# Patient Record
Sex: Female | Born: 1945 | Race: White | Hispanic: No | Marital: Married | Smoking: Never smoker
Health system: Southern US, Community
[De-identification: ages and names within clinical notes are randomized; demographics above are authoritative.]

## PROBLEM LIST (undated history)

## (undated) DIAGNOSIS — R000 Tachycardia, unspecified: Secondary | ICD-10-CM

## (undated) DIAGNOSIS — R432 Parageusia: Secondary | ICD-10-CM

## (undated) DIAGNOSIS — S32020A Wedge compression fracture of second lumbar vertebra, initial encounter for closed fracture: Secondary | ICD-10-CM

## (undated) DIAGNOSIS — M8580 Other specified disorders of bone density and structure, unspecified site: Secondary | ICD-10-CM

## (undated) DIAGNOSIS — G588 Other specified mononeuropathies: Secondary | ICD-10-CM

## (undated) DIAGNOSIS — M1990 Unspecified osteoarthritis, unspecified site: Secondary | ICD-10-CM

## (undated) DIAGNOSIS — M810 Age-related osteoporosis without current pathological fracture: Secondary | ICD-10-CM

## (undated) DIAGNOSIS — E785 Hyperlipidemia, unspecified: Secondary | ICD-10-CM

## (undated) DIAGNOSIS — N3010 Interstitial cystitis (chronic) without hematuria: Secondary | ICD-10-CM

## (undated) DIAGNOSIS — M479 Spondylosis, unspecified: Secondary | ICD-10-CM

## (undated) DIAGNOSIS — F32A Depression, unspecified: Secondary | ICD-10-CM

## (undated) DIAGNOSIS — F419 Anxiety disorder, unspecified: Secondary | ICD-10-CM

## (undated) DIAGNOSIS — H269 Unspecified cataract: Secondary | ICD-10-CM

## (undated) DIAGNOSIS — U071 COVID-19: Secondary | ICD-10-CM

## (undated) DIAGNOSIS — E079 Disorder of thyroid, unspecified: Secondary | ICD-10-CM

## (undated) DIAGNOSIS — G629 Polyneuropathy, unspecified: Secondary | ICD-10-CM

## (undated) DIAGNOSIS — T7840XA Allergy, unspecified, initial encounter: Secondary | ICD-10-CM

## (undated) DIAGNOSIS — Z8719 Personal history of other diseases of the digestive system: Secondary | ICD-10-CM

## (undated) DIAGNOSIS — B977 Papillomavirus as the cause of diseases classified elsewhere: Secondary | ICD-10-CM

## (undated) HISTORY — DX: Anxiety disorder, unspecified: F41.9

## (undated) HISTORY — DX: Unspecified osteoarthritis, unspecified site: M19.90

## (undated) HISTORY — DX: Other specified disorders of bone density and structure, unspecified site: M85.80

## (undated) HISTORY — DX: Interstitial cystitis (chronic) without hematuria: N30.10

## (undated) HISTORY — DX: Papillomavirus as the cause of diseases classified elsewhere: B97.7

## (undated) HISTORY — DX: Personal history of other diseases of the digestive system: Z87.19

## (undated) HISTORY — DX: Other specified mononeuropathies: G58.8

## (undated) HISTORY — DX: Parageusia: R43.2

## (undated) HISTORY — DX: Wedge compression fracture of second lumbar vertebra, initial encounter for closed fracture: S32.020A

## (undated) HISTORY — DX: Tachycardia, unspecified: R00.0

## (undated) HISTORY — DX: Hyperlipidemia, unspecified: E78.5

## (undated) HISTORY — DX: Spondylosis, unspecified: M47.9

## (undated) HISTORY — DX: Age-related osteoporosis without current pathological fracture: M81.0

## (undated) HISTORY — DX: Disorder of thyroid, unspecified: E07.9

## (undated) HISTORY — DX: Unspecified cataract: H26.9

## (undated) HISTORY — DX: COVID-19: U07.1

## (undated) HISTORY — DX: Depression, unspecified: F32.A

## (undated) HISTORY — DX: Allergy, unspecified, initial encounter: T78.40XA

## (undated) HISTORY — PX: WISDOM TOOTH EXTRACTION: SHX21

## (undated) HISTORY — DX: Polyneuropathy, unspecified: G62.9

---

## 1983-01-11 HISTORY — PX: FOOT NEUROMA SURGERY: SHX646

## 2005-08-12 ENCOUNTER — Encounter: Payer: Self-pay | Admitting: Cardiovascular Disease

## 2006-09-02 ENCOUNTER — Encounter: Payer: Self-pay | Admitting: Cardiovascular Disease

## 2006-09-07 ENCOUNTER — Encounter: Payer: Self-pay | Admitting: Cardiovascular Disease

## 2010-07-21 ENCOUNTER — Ambulatory Visit: Payer: Self-pay | Admitting: Family Medicine

## 2010-10-12 ENCOUNTER — Ambulatory Visit: Payer: Self-pay | Admitting: Internal Medicine

## 2010-10-14 ENCOUNTER — Ambulatory Visit: Payer: Self-pay | Admitting: Internal Medicine

## 2010-12-07 ENCOUNTER — Ambulatory Visit: Payer: Self-pay | Admitting: Unknown Physician Specialty

## 2011-11-29 ENCOUNTER — Ambulatory Visit: Payer: Self-pay | Admitting: Internal Medicine

## 2012-08-01 ENCOUNTER — Ambulatory Visit: Payer: Self-pay | Admitting: Internal Medicine

## 2013-01-10 DIAGNOSIS — G629 Polyneuropathy, unspecified: Secondary | ICD-10-CM

## 2013-01-10 HISTORY — DX: Polyneuropathy, unspecified: G62.9

## 2013-03-13 ENCOUNTER — Ambulatory Visit (INDEPENDENT_AMBULATORY_CARE_PROVIDER_SITE_OTHER): Payer: Medicare Other | Admitting: Podiatry

## 2013-03-13 ENCOUNTER — Encounter: Payer: Self-pay | Admitting: Podiatry

## 2013-03-13 VITALS — BP 104/60 | HR 93 | Resp 16 | Ht 67.0 in | Wt 116.0 lb

## 2013-03-13 DIAGNOSIS — M79609 Pain in unspecified limb: Secondary | ICD-10-CM

## 2013-03-13 DIAGNOSIS — L608 Other nail disorders: Secondary | ICD-10-CM

## 2013-03-13 NOTE — Progress Notes (Signed)
   Subjective:    Patient ID: Jessica Miles, female    DOB: 21-Oct-1945, 68 y.o.   MRN: [id number]  HPI Comments: This little pinky toenail on my left foot, it is thick and sore. It even hurts when laying in bed and it touches the bed sheet and certain shoes. Have used lamisil in the past for it . It was removed as well .      Review of Systems  All other systems reviewed and are negative.       Objective:   Physical Exam I have reviewed her past medical history medications allergies surgeries social history. Pulses are strongly palpable bilateral. Neurologic sensorium is intact per since once the monofilament. Deep tendon reflexes are intact bilateral muscle strength is 5 over 5 dorsiflexors plantar flexors inverters and everters all intrinsic musculature is intact. Orthopedic evaluation demonstrates all joints distal to the ankle a full range of motion without crepitus she does demonstrate an adductovarus rotated hammertoe deformity fifth left. Cutaneous evaluation demonstrates supple well hydrated cutis with exception of the fifth digit of the left foot which does demonstrate a dystrophic nail possibly mycotic.        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  Plan: Debridement of thickness of the nail today hopefully this will alleviate her symptoms should it not then nail avulsion with matrixectomy is her next option

## 2013-07-03 ENCOUNTER — Ambulatory Visit (INDEPENDENT_AMBULATORY_CARE_PROVIDER_SITE_OTHER): Payer: Medicare Other | Admitting: Podiatry

## 2013-07-03 ENCOUNTER — Ambulatory Visit (INDEPENDENT_AMBULATORY_CARE_PROVIDER_SITE_OTHER): Payer: Medicare Other

## 2013-07-03 VITALS — BP 99/55 | HR 86 | Resp 16

## 2013-07-03 DIAGNOSIS — M779 Enthesopathy, unspecified: Secondary | ICD-10-CM

## 2013-07-03 DIAGNOSIS — M7750 Other enthesopathy of unspecified foot: Secondary | ICD-10-CM

## 2013-07-03 NOTE — Progress Notes (Signed)
She presents today complaining of numbness and tingling to the ball of her foot.  Objective: Vital signs are stable she is alert and oriented x3. Pulses are palpable bilateral. Medial lateral compression of the metatarsals replicates the sensations that she feels with shoe gear. When asked her of her shoe gear is narrow she states yes. This simply comes down tissues been too narrow. Radiographic evaluation does not demonstrate any type of osseous abnormalities whatsoever.  Assessment: Metatarsalgia with tight shoe gear.  Plan: Discussed appropriate shoe gear. Followup with me as needed

## 2013-07-22 ENCOUNTER — Ambulatory Visit: Payer: Medicare Other | Admitting: Podiatry

## 2013-07-22 ENCOUNTER — Encounter: Payer: Self-pay | Admitting: Podiatry

## 2013-07-22 VITALS — BP 105/62 | HR 74 | Resp 12

## 2013-07-22 DIAGNOSIS — M7750 Other enthesopathy of unspecified foot: Secondary | ICD-10-CM

## 2013-07-22 DIAGNOSIS — G589 Mononeuropathy, unspecified: Secondary | ICD-10-CM

## 2013-07-22 DIAGNOSIS — M778 Other enthesopathies, not elsewhere classified: Secondary | ICD-10-CM

## 2013-07-22 DIAGNOSIS — M779 Enthesopathy, unspecified: Secondary | ICD-10-CM

## 2013-07-22 DIAGNOSIS — M79609 Pain in unspecified limb: Secondary | ICD-10-CM

## 2013-07-22 DIAGNOSIS — G629 Polyneuropathy, unspecified: Secondary | ICD-10-CM

## 2013-07-22 NOTE — Progress Notes (Signed)
She presents today still complaining of numbness in the swelling sensation to the forefoot bilaterally right greater than left intermittent. She has tried different shoes to no avail. She is requesting metatarsal pads or metatarsal bars or even orthotics. She relates that her mother had a similar issue.  Objective: Vital signs are stable she is alert and oriented x3 pulses are strongly palpable bilateral. No reproducible sensation today upon palpation. She does have pain on plantar flexion at the second metatarsophalangeal joint that could result in swelling and inflammation that could cause neuritis or neuropathy type symptoms. Otherwise on leaning toward idiopathic peripheral neuropathy.  Assessment: Rule out idiopathic neuropathy. Consider capsulitis second metatarsophalangeal joint bilateral.  Plan: Placed metatarsal pads on her insoles within her tennis shoes. I injected about the second metatarsophalangeal joint of the bilateral foot with Kenalog and local anesthetic. She also will start NuRemedy.

## 2013-08-12 ENCOUNTER — Ambulatory Visit (INDEPENDENT_AMBULATORY_CARE_PROVIDER_SITE_OTHER): Payer: Medicare Other | Admitting: Podiatry

## 2013-08-12 ENCOUNTER — Ambulatory Visit: Payer: Medicare Other | Admitting: Podiatry

## 2013-08-12 VITALS — BP 111/64 | HR 72 | Resp 16

## 2013-08-12 DIAGNOSIS — G629 Polyneuropathy, unspecified: Secondary | ICD-10-CM

## 2013-08-12 DIAGNOSIS — G589 Mononeuropathy, unspecified: Secondary | ICD-10-CM

## 2013-08-12 NOTE — Progress Notes (Signed)
She presents today for followup and a better understanding of her idiopathic neuropathy. She states that since she has been taking the supplements the numbness in the tips of her toes seems to have resolved to some degree. However they seem she also be aggravated by certain exercises and pressures.  Objective: Vital signs are stable she is alert and oriented x3. No change in her physical findings. Pulses remain strongly palpable.  Assessment: Idiopathic neuropathy bilateral.  Plan: Discussed etiology pathology conservative or surgical therapies. I would continue to take the medication if it is helping. At this point I think the best thing is to research it causes her of her neuropathy. A neurologist probably benefit her best at this point. We could consider physical therapy as an adjunct later we also could consider orthotics.

## 2013-08-14 ENCOUNTER — Encounter: Payer: Self-pay | Admitting: Podiatry

## 2013-08-14 NOTE — Progress Notes (Signed)
Patient referred to [facility] , appt is for 8.11.15 @ 9.00

## 2013-08-20 ENCOUNTER — Ambulatory Visit (INDEPENDENT_AMBULATORY_CARE_PROVIDER_SITE_OTHER): Payer: Medicare Other | Admitting: Diagnostic Neuroimaging

## 2013-08-20 ENCOUNTER — Encounter: Payer: Self-pay | Admitting: Diagnostic Neuroimaging

## 2013-08-20 VITALS — BP 125/69 | HR 72 | Ht 67.0 in | Wt 116.2 lb

## 2013-08-20 DIAGNOSIS — M79609 Pain in unspecified limb: Secondary | ICD-10-CM

## 2013-08-20 DIAGNOSIS — R200 Anesthesia of skin: Secondary | ICD-10-CM

## 2013-08-20 DIAGNOSIS — R209 Unspecified disturbances of skin sensation: Secondary | ICD-10-CM

## 2013-08-20 NOTE — Progress Notes (Signed)
[facility]  PATIENT: Jessica Miles DOB: [birth year]  REFERRING CLINICIAN: M Hyatt HISTORY FROM: patient  REASON FOR VISIT: new consult   HISTORICAL  CHIEF COMPLAINT:  Chief Complaint  Patient presents with  . Peripheral Neuropathy    HISTORY OF PRESENT ILLNESS:   68 year old right-handed female with anxiety, tachycardia, hypothyroidism, IBS, here for evaluation of neuropathy.  5 months ago patient had significant increase in physical activity with increased exercise on a daily basis. End of June 2015 patient also started playing tennis. Within a few weeks patient developed numbness in her toes and feet. She felt a "pad sensation" on the balls of her feet. Patient also was developing some cramps in her calves. Patient went to PCP and podiatry, treated with metatarsal pads, cortisone shots in the feet, started on vitamin supplementation. Over the past few weeks patient symptoms have slightly improved. She still has some numbness on the bottom of her feet as well as some intermittent cramps. Patient is also to try to reduce her physical activity. Patient had blood testing by PCP with normal B12 level, and slightly low TSH.  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  Patient denies any neck or low back pain. No problems with her fingers or hands. No vision changes, slurred speech trouble talking headaches.  REVIEW OF SYSTEMS: Full 14 system review of systems performed and notable only for leg cramps anxiety feeling cold numbness ringing in ears.  ALLERGIES: Allergies  Allergen Reactions  . Allegra [Fexofenadine Hcl]     Makes heart race   . Fexofenadine Palpitations    HOME MEDICATIONS: Outpatient Prescriptions Prior to Visit  Medication Sig Dispense Refill  . ascorbic acid (VITAMIN C) 500 MG tablet Take 500 mg by mouth 2 (two) times daily.       Marland Kitchen atenolol (TENORMIN) 25 MG tablet Take 25 mg by mouth  daily as needed.       Marland Kitchen buPROPion (WELLBUTRIN SR) 100 MG 12 hr tablet Take 100 mg by mouth 2 (two) times daily.      . cycloSPORINE (RESTASIS) 0.05 % ophthalmic emulsion 1 drop 2 (two) times daily.      Marland Kitchen levothyroxine (SYNTHROID, LEVOTHROID) 100 MCG tablet Take 100 mcg by mouth daily before breakfast.      . saccharomyces boulardii (FLORASTOR) 250 MG capsule Take by mouth 2 (two) times daily.       . Tobramycin-Dexamethasone (TOBRADEX OP) Apply to eye as needed.       Marland Kitchen atenolol (TENORMIN) 25 MG tablet Take by mouth.      Marland Kitchen buPROPion (WELLBUTRIN SR) 100 MG 12 hr tablet Take by mouth.      . calcium carbonate (OS-CAL) 600 MG TABS tablet Take 600 mg by mouth 2 (two) times daily with a meal.      . Calcium Citrate-Vitamin D ([employer] CALCIUM CITRATE +D) 315-250 MG-UNIT TABS Take by mouth.      . cycloSPORINE (RESTASIS) 0.05 % ophthalmic emulsion Apply to eye.      . levothyroxine (SYNTHROID, LEVOTHROID) 100 MCG tablet Take by mouth.      . magnesium oxide (MAG-OX) 400 MG tablet Take 400 mg by mouth daily.      . magnesium oxide (MAG-OX) 400 MG tablet Take by mouth.      . pimecrolimus (ELIDEL) 1 % cream Apply topically.      . vitamin C (ASCORBIC ACID) 500 MG tablet Take by mouth.  No facility-administered medications prior to visit.    PAST MEDICAL HISTORY: Past Medical History  Diagnosis Date  . Thyroid disease   . Tachycardia   . Anxiety     PAST SURGICAL HISTORY: History reviewed. No pertinent past surgical history.  FAMILY HISTORY: Family History  Problem Relation Age of Onset  . Heart failure Mother   . Heart attack Father   . Heart attack Maternal Grandfather     SOCIAL HISTORY:  History   Social History  . Marital Status: Married    Spouse Name: Mercie Eon. Toops    Number of Children: 2  . Years of Education: MA   Occupational History  . Retired    Social History Main Topics  . Smoking status: Never Smoker   . Smokeless tobacco: Never Used  . Alcohol  Use: Yes     Comment: 2-3 glasses per week  . Drug Use: No  . Sexual Activity: Not on file   Other Topics Concern  . Not on file   Social History Narrative   Patient lives at home with spouse.   Caffeine Use: 1 cup daily     PHYSICAL EXAM  Filed Vitals:   08/20/13 0835  BP: 125/69  Pulse: 72  Height: 5\' 7"  (1.702 m)  Weight: 116 lb 3.2 oz (52.708 kg)    Not recorded    Body mass index is 18.2 kg/(m^2).  GENERAL EXAM: Patient is in no distress; well developed, nourished and groomed; neck is supple  CARDIOVASCULAR: Regular rate and rhythm, no murmurs, no carotid bruits  NEUROLOGIC: MENTAL STATUS: awake, alert, oriented to person, place and time, recent and remote memory intact, normal attention and concentration, language fluent, comprehension intact, naming intact, fund of knowledge appropriate CRANIAL NERVE: no papilledema on fundoscopic exam, pupils equal and reactive to light, visual fields full to confrontation, extraocular muscles intact, no nystagmus, facial sensation and strength symmetric, hearing intact, palate elevates symmetrically, uvula midline, shoulder shrug symmetric, tongue midline. MOTOR: normal bulk and tone, full strength in the BUE, BLE SENSORY: normal and symmetric to light touch, proprioception; DECR PP AND TEMP IN DISTAL FEET / TOES. RIGHT TOE VIB 7 SEC; LEFT TOE VIB 10 SEC COORDINATION: finger-nose-finger, fine finger movements normal REFLEXES: deep tendon reflexes present and symmetric; TRACE AT ANKLES. DOWN GOING TOES. GAIT/STATION: narrow based gait; able to walk on toes, heels and tandem; romberg is negative    DIAGNOSTIC DATA (LABS, IMAGING, TESTING) - I reviewed patient records, labs, notes, testing and imaging myself where available.  No results found for this basename: WBC, HGB, HCT, MCV, PLT   No results found for this basename: na, k, cl, co2, glucose, bun, creatinine, calcium, prot, albumin, ast, alt, alkphos, bilitot, gfrnonaa,  gfraa   No results found for this basename: CHOL, HDL, LDLCALC, LDLDIRECT, TRIG, CHOLHDL   No results found for this basename: HGBA1C   No results found for this basename: VITAMINB12   No results found for this basename: TSH    B12 > 1999 TSH 0.144    ASSESSMENT AND PLAN  68 y.o. year old female here with numbness and tingling in toes, feet, since June 2015. Exam notavble for decr PP, temp in distal feet / toes and trace reflexes at ankles.  Ddx: neuropathy (small fiber, large fiber, metabolic, autoimmune, inflamm, compressive, overuse, idiopathic)  PLAN: - neuropathy labs - observe symptoms; currently not painful, so no role for gabapentin or lyrica - continue vitamin supplements - if symptoms significantly increase or change,  then will check EMG/NCS  Orders Placed This Encounter  Procedures  . IFE and PE, Serum  . ANA w/Reflex if Positive  . Sedimentation Rate  . C-reactive Protein   Return in about 3 months (around 11/20/2013).   Penni Bombard, MD 6/76/7209, 4:70 AM Certified in Neurology, Neurophysiology and Neuroimaging  [facility] [address] [place], [place] [address] [phone]

## 2013-08-20 NOTE — Patient Instructions (Signed)
I will check lab testing.  Monitor symptoms.

## 2013-08-23 LAB — SEDIMENTATION RATE: Sed Rate: 2 mm/hr (ref 0–40)

## 2013-08-23 LAB — IFE AND PE, SERUM
ALBUMIN SERPL ELPH-MCNC: 4 g/dL (ref 3.2–5.6)
ALPHA2 GLOB SERPL ELPH-MCNC: 0.8 g/dL (ref 0.4–1.2)
Albumin/Glob SerPl: 1.3 (ref 0.7–2.0)
Alpha 1: 0.2 g/dL (ref 0.1–0.4)
B-Globulin SerPl Elph-Mcnc: 1.1 g/dL (ref 0.6–1.3)
Gamma Glob SerPl Elph-Mcnc: 1.2 g/dL (ref 0.5–1.6)
Globulin, Total: 3.3 g/dL (ref 2.0–4.5)
IGA/IMMUNOGLOBULIN A, SERUM: 156 mg/dL (ref 91–414)
IgG (Immunoglobin G), Serum: 1041 mg/dL (ref 700–1600)
IgM (Immunoglobulin M), Srm: 76 mg/dL (ref 40–230)
Total Protein: 7.3 g/dL (ref 6.0–8.5)

## 2013-08-23 LAB — C-REACTIVE PROTEIN: CRP: 0.8 mg/L (ref 0.0–4.9)

## 2013-08-23 LAB — ANA W/REFLEX IF POSITIVE: Anti Nuclear Antibody(ANA): NEGATIVE

## 2013-09-02 DIAGNOSIS — M79609 Pain in unspecified limb: Secondary | ICD-10-CM

## 2013-09-09 DIAGNOSIS — M79609 Pain in unspecified limb: Secondary | ICD-10-CM

## 2013-09-17 ENCOUNTER — Ambulatory Visit: Payer: Self-pay | Admitting: Internal Medicine

## 2013-10-11 ENCOUNTER — Telehealth: Payer: Self-pay | Admitting: Family Medicine

## 2013-10-11 ENCOUNTER — Telehealth: Payer: Self-pay | Admitting: Diagnostic Neuroimaging

## 2013-10-11 DIAGNOSIS — M79609 Pain in unspecified limb: Secondary | ICD-10-CM

## 2013-10-11 NOTE — Telephone Encounter (Signed)
Pt called wanted to get in to see dr Deborra Medina or anyone else sooner  She has a new patient appointment 03/18/14. She stated she went to see her PCP Dr Dear @ [facility] in July and dr dear did labs and wanted to have them repeated in 8 weeks pt stated she went back to have labs done 10/07/13 pt stated her kidney function is worse.  She saw the NP @ [facility] but wanted to talk someone else about this. She stated that Dr Dear moved and the DRs  @ [facility] only does ob patients.  She stated she also sees dr Leta Baptist for Neuropathy and Dr Vira Agar in GI for IBS.  She stated the last 3 weeks she is constantly nauseas and her food has no taste  Can leave message on home phone number is patient doesn't answer phone  [phone]

## 2013-10-11 NOTE — Telephone Encounter (Signed)
Spoke with patient, appointment was scheduled for 10/14/13 at 2 pm with Dr Leta Baptist

## 2013-10-11 NOTE — Telephone Encounter (Signed)
Appointment for 11/18/13 with Dr Leta Baptist has been cancelled also

## 2013-10-11 NOTE — Telephone Encounter (Signed)
Patient calling to state she has some new symptoms of her condition and that she would like to be seen sooner, states for the past 3 weeks she has had a loss of taste. Please return call and advise.

## 2013-10-11 NOTE — Telephone Encounter (Signed)
I am now part time so new patient appts are more limited.  It may be best for her to establish with another provider.

## 2013-10-14 ENCOUNTER — Telehealth: Payer: Self-pay | Admitting: *Deleted

## 2013-10-14 ENCOUNTER — Ambulatory Visit: Payer: Self-pay | Admitting: Diagnostic Neuroimaging

## 2013-10-14 NOTE — Telephone Encounter (Signed)
Left message for patient to reschedule office visit due to provider emergency.

## 2013-10-16 ENCOUNTER — Ambulatory Visit (INDEPENDENT_AMBULATORY_CARE_PROVIDER_SITE_OTHER): Payer: Medicare Other | Admitting: Diagnostic Neuroimaging

## 2013-10-16 ENCOUNTER — Encounter: Payer: Self-pay | Admitting: Diagnostic Neuroimaging

## 2013-10-16 VITALS — BP 120/60 | HR 94 | Wt 115.2 lb

## 2013-10-16 DIAGNOSIS — R200 Anesthesia of skin: Secondary | ICD-10-CM

## 2013-10-16 DIAGNOSIS — R208 Other disturbances of skin sensation: Secondary | ICD-10-CM

## 2013-10-16 DIAGNOSIS — R432 Parageusia: Secondary | ICD-10-CM

## 2013-10-16 NOTE — Progress Notes (Signed)
[facility]  PATIENT: Jessica Miles DOB: 1945-01-16  REFERRING CLINICIAN:  HISTORY FROM: patient  REASON FOR VISIT: follow up   HISTORICAL  CHIEF COMPLAINT:  Chief Complaint  Patient presents with  . Follow-up    Numbness in feet# 6    HISTORY OF PRESENT ILLNESS:   UPDATE 10/16/13: Since last visit, numbness in feet slightly improved; continues on ALA, benfotamine, B12 supplements. Also has new problem, consistenting of reduced taste perception. Especially diff with sweet and sour. Smell is slightly reduced. She noticed this 3 weeks ago.   PRIOR HPI (68 year old right-handed female with anxiety, tachycardia, hypothyroidism, IBS, here for evaluation of neuropathy. 5 months ago patient had significant increase in physical activity with increased exercise on a daily basis. End of June 2015 patient also started playing tennis. Within a few weeks patient developed numbness in her toes and feet. She felt a "pad sensation" on the balls of her feet. Patient also was developing some cramps in her calves. Patient went to PCP and podiatry, treated with metatarsal pads, cortisone shots in the feet, started on vitamin supplementation. Over the past few weeks patient symptoms have slightly improved. She still has some numbness on the bottom of her feet as well as some intermittent cramps. Patient is also to try to reduce her physical activity. Patient had blood testing by PCP with normal B12 level, and slightly low TSH. 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 Patient denies any neck or low back pain. No problems with her fingers or hands. No vision changes, slurred speech trouble talking headaches.   REVIEW OF SYSTEMS: Full 14 system review of systems performed and notable only for nausea and numbness.  ALLERGIES: Allergies  Allergen Reactions  . Allegra [Fexofenadine Hcl]     Makes heart race   . Fexofenadine  Palpitations    HOME MEDICATIONS: Outpatient Prescriptions Prior to Visit  Medication Sig Dispense Refill  . Alpha-Lipoic Acid 600 MG CAPS Take 1 capsule by mouth daily.      Marland Kitchen ascorbic acid (VITAMIN C) 500 MG tablet Take 500 mg by mouth 2 (two) times daily.       Marland Kitchen atenolol (TENORMIN) 25 MG tablet Take 25 mg by mouth daily as needed.       . BENFOTIAMINE PO Take 600 mg by mouth daily.      Marland Kitchen buPROPion (WELLBUTRIN SR) 100 MG 12 hr tablet Take 100 mg by mouth 2 (two) times daily.      . Calcium Carb-Cholecalciferol (CALCIUM-VITAMIN D) 600-400 MG-UNIT TABS Take 1 tablet by mouth 2 (two) times daily.      . Cyanocobalamin (VITAMIN B-12 PO) Take 2,500 mg by mouth daily.      . cycloSPORINE (RESTASIS) 0.05 % ophthalmic emulsion 1 drop 2 (two) times daily.      . Magnesium Oxide 500 MG TABS Take 1 tablet by mouth daily.      Marland Kitchen saccharomyces boulardii (FLORASTOR) 250 MG capsule Take by mouth 2 (two) times daily.       . Tobramycin-Dexamethasone (TOBRADEX OP) Apply to eye as needed.       Marland Kitchen levothyroxine (SYNTHROID, LEVOTHROID) 100 MCG tablet Take 100 mcg by mouth daily before breakfast.       No facility-administered medications prior to visit.    PAST MEDICAL HISTORY: Past Medical History  Diagnosis Date  . Thyroid disease   . Tachycardia   . Anxiety     PAST SURGICAL  HISTORY: No past surgical history on file.  FAMILY HISTORY: Family History  Problem Relation Age of Onset  . Heart failure Mother   . Heart attack Father   . Heart attack Maternal Grandfather     SOCIAL HISTORY:  History   Social History  . Marital Status: Married    Spouse Name: Mercie Eon. Azzara    Number of Children: 2  . Years of Education: MA   Occupational History  . Retired    Social History Main Topics  . Smoking status: Never Smoker   . Smokeless tobacco: Never Used  . Alcohol Use: Yes     Comment: 2-3 glasses per week  . Drug Use: No  . Sexual Activity: Not on file   Other Topics Concern   . Not on file   Social History Narrative   Patient lives at home with spouse.   Caffeine Use: 1 cup daily     PHYSICAL EXAM  Filed Vitals:   10/16/13 1252  BP: 120/60  Pulse: 94  Weight: 115 lb 3.2 oz (52.254 kg)    Not recorded    Body mass index is 18.04 kg/(m^2).  GENERAL EXAM: Patient is in no distress; well developed, nourished and groomed; neck is supple  CARDIOVASCULAR: Regular rate and rhythm, no murmurs, no carotid bruits  NEUROLOGIC: MENTAL STATUS: awake, alert, language fluent, comprehension intact, naming intact, fund of knowledge appropriate CRANIAL NERVE: no papilledema on fundoscopic exam, pupils equal and reactive to light, visual fields full to confrontation, extraocular muscles intact, no nystagmus, facial sensation and strength symmetric, hearing intact, palate elevates symmetrically, uvula midline, shoulder shrug symmetric, tongue midline. MOTOR: normal bulk and tone, full strength in the BUE, BLE SENSORY: normal and symmetric to light touch, proprioception; DECR PP AND TEMP IN DISTAL FEET / TOES. RIGHT TOE VIB <5 SEC; LEFT TOE VIB 10 SEC COORDINATION: finger-nose-finger, fine finger movements normal REFLEXES: deep tendon reflexes present and symmetric; TRACE AT ANKLES. DOWN GOING TOES. GAIT/STATION: narrow based gait; able to walk on toes, heels and tandem; romberg is negative    DIAGNOSTIC DATA (LABS, IMAGING, TESTING) - I reviewed patient records, labs, notes, testing and imaging myself where available.  No results found for this basename: WBC,  HGB,  HCT,  MCV,  PLT   No results found for this basename: na,  k,  cl,  co2,  glucose,  bun,  creatinine,  calcium,  prot,  albumin,  ast,  alt,  alkphos,  bilitot,  gfrnonaa,  gfraa   No results found for this basename: CHOL,  HDL,  LDLCALC,  LDLDIRECT,  TRIG,  CHOLHDL   No results found for this basename: HGBA1C   No results found for this basename: VITAMINB12   No results found for this  basename: TSH    B12 > 1999 TSH 0.144 ANA, SPEP (IFE), CRP, ESR - normal   ASSESSMENT AND PLAN  68 y.o. year old female here with numbness and tingling in toes, feet, since June 2015. Exam notable for decr PP, temp in distal feet / toes and trace reflexes at ankles. Now with 3 weeks of decreased taste sensation.  Ddx: neuropathy (small fiber, large fiber, metabolic, autoimmune, inflamm, compressive, overuse, idiopathic)  PLAN: - MRI brain w/wo - may consider ENT evaluate of altered taste sensation - continue vitamin supplements  Orders Placed This Encounter  Procedures  . MR Brain W Wo Contrast   Return in about 3 months (around 01/16/2014).   Penni Bombard, MD 10/16/2013,  4:07 PM Certified in Neurology, Neurophysiology and [facility] [address] [place],  [address] [phone]

## 2013-10-16 NOTE — Telephone Encounter (Signed)
Spoke with pt offered to get her in sooner with [facility]. Pt stated she would wait to see Dr Deborra Medina in march

## 2013-10-16 NOTE — Telephone Encounter (Signed)
Left message asking pt to call office see below note

## 2013-10-16 NOTE — Patient Instructions (Signed)
I will check MRI brain.  

## 2013-10-29 ENCOUNTER — Ambulatory Visit
Admission: RE | Admit: 2013-10-29 | Discharge: 2013-10-29 | Disposition: A | Discharge status: Home / Self Care | Payer: Medicare Other | Source: Ambulatory Visit | Attending: Diagnostic Neuroimaging | Admitting: Diagnostic Neuroimaging

## 2013-10-29 DIAGNOSIS — R432 Parageusia: Secondary | ICD-10-CM

## 2013-10-29 DIAGNOSIS — R208 Other disturbances of skin sensation: Secondary | ICD-10-CM

## 2013-10-29 DIAGNOSIS — R200 Anesthesia of skin: Secondary | ICD-10-CM

## 2013-10-29 MED ORDER — GADOBENATE DIMEGLUMINE 529 MG/ML IV SOLN
10.0000 mL | Freq: Once | INTRAVENOUS | Status: AC | PRN
  Administered 2013-10-29: 10 mL via INTRAVENOUS

## 2013-11-04 ENCOUNTER — Ambulatory Visit (INDEPENDENT_AMBULATORY_CARE_PROVIDER_SITE_OTHER): Payer: Medicare Other | Admitting: Podiatry

## 2013-11-04 ENCOUNTER — Encounter: Payer: Self-pay | Admitting: Podiatry

## 2013-11-04 VITALS — BP 114/58 | HR 86 | Resp 16 | Ht 67.0 in | Wt 114.0 lb

## 2013-11-04 DIAGNOSIS — M778 Other enthesopathies, not elsewhere classified: Secondary | ICD-10-CM

## 2013-11-04 DIAGNOSIS — M79673 Pain in unspecified foot: Secondary | ICD-10-CM

## 2013-11-04 DIAGNOSIS — M779 Enthesopathy, unspecified: Principal | ICD-10-CM

## 2013-11-04 DIAGNOSIS — G629 Polyneuropathy, unspecified: Secondary | ICD-10-CM

## 2013-11-04 DIAGNOSIS — M7750 Other enthesopathy of unspecified foot: Secondary | ICD-10-CM

## 2013-11-04 NOTE — Progress Notes (Signed)
She presents today since that she seems to be doing some better however she would like to consider a pair of orthotics for the metatarsalgia capsulitis and idiopathic neuropathy that she is continuing with.  Objective: Vital signs are stable she is alert and oriented 3. Pulses are strongly palpable bilateral. No change in neurologic evaluation from previous exams.  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  Plan: An accommodative or semirigid type orthotic was prescribed today and we will try to offload all of her metatarsals. I will follow up with her once those come in.

## 2013-11-05 ENCOUNTER — Telehealth: Payer: Self-pay | Admitting: Diagnostic Neuroimaging

## 2013-11-05 NOTE — Telephone Encounter (Signed)
Called patient. Gave results

## 2013-11-05 NOTE — Telephone Encounter (Signed)
Patient calling to request MRI results from 10/29/13, please return call and advise.

## 2013-11-05 NOTE — Telephone Encounter (Signed)
pls call patient with normal MRI results. -VRP

## 2013-11-18 ENCOUNTER — Ambulatory Visit: Payer: Medicare Other | Admitting: Diagnostic Neuroimaging

## 2013-12-13 ENCOUNTER — Encounter: Payer: Self-pay | Admitting: Endocrinology

## 2013-12-13 ENCOUNTER — Ambulatory Visit (INDEPENDENT_AMBULATORY_CARE_PROVIDER_SITE_OTHER): Payer: Medicare Other | Admitting: Endocrinology

## 2013-12-13 VITALS — BP 112/70 | HR 70 | Temp 97.7°F | Ht 67.0 in | Wt 119.0 lb

## 2013-12-13 DIAGNOSIS — E039 Hypothyroidism, unspecified: Secondary | ICD-10-CM

## 2013-12-13 NOTE — Progress Notes (Signed)
Subjective:    Patient ID: Jessica Miles, female    DOB: 28-Dec-1945, 68 y.o.   MRN: [id number]  HPI Pt reports hypothyroidism was dx'ed in 1985.  she has been on synthroid since then.  she has never taken kelp or any other type of non-prescribed thyroid product.  she has never had thyroid imaging.  She has never had thyroid surgery, or XRT to the neck.  He has never been on amiodarone or lithium.  In 2005, pt had moderate palpitations in the chest, but no assoc sob.  She was found to have a suppressed TSH, and synthroid was reduced then.  More recently, she has been adjusting synthroid based on her sxs.   She now takes QD, alternating 100 and 88 mcg.   Past Medical History  Diagnosis Date  . Thyroid disease   . Tachycardia   . Anxiety     No past surgical history on file.  History   Social History  . Marital Status: Married    Spouse Name: Mercie Eon. Robledo    Number of Children: 2  . Years of Education: MA   Occupational History  . Retired    Social History Main Topics  . Smoking status: Never Smoker   . Smokeless tobacco: Never Used  . Alcohol Use: Yes     Comment: 2-3 glasses per week  . Drug Use: No  . Sexual Activity: Not on file   Other Topics Concern  . Not on file   Social History Narrative   Patient lives at home with spouse.   Caffeine Use: 1 cup daily    Current Outpatient Prescriptions on File Prior to Visit  Medication Sig Dispense Refill  . Alpha-Lipoic Acid 600 MG CAPS Take 1 capsule by mouth daily.    Marland Kitchen ascorbic acid (VITAMIN C) 500 MG tablet Take 500 mg by mouth 2 (two) times daily.     Marland Kitchen atenolol (TENORMIN) 25 MG tablet Take 25 mg by mouth daily as needed.     . BENFOTIAMINE PO Take 600 mg by mouth daily.    Marland Kitchen buPROPion (WELLBUTRIN SR) 100 MG 12 hr tablet Take 100 mg by mouth 2 (two) times daily.    . Calcium Carb-Cholecalciferol (CALCIUM-VITAMIN D) 600-400 MG-UNIT TABS Take 1 tablet by mouth 2 (two) times daily.    . Cyanocobalamin (VITAMIN B-12  PO) Take 2,500 mg by mouth daily.    . cycloSPORINE (RESTASIS) 0.05 % ophthalmic emulsion 1 drop 2 (two) times daily.    Marland Kitchen levothyroxine (SYNTHROID, LEVOTHROID) 88 MCG tablet Take 88 mcg by mouth daily before breakfast.    . Magnesium Oxide 500 MG TABS Take 1 tablet by mouth daily.    Marland Kitchen saccharomyces boulardii (FLORASTOR) 250 MG capsule Take by mouth 2 (two) times daily.     . Tobramycin-Dexamethasone (TOBRADEX OP) Apply to eye as needed.     . Zinc 100 MG TABS Take 1 tablet by mouth daily.     No current facility-administered medications on file prior to visit.    Allergies  Allergen Reactions  . Allegra [Fexofenadine Hcl]     Makes heart race   . Fexofenadine Palpitations    Family History  Problem Relation Age of Onset  . Heart failure Mother   . Heart attack Father   . Heart attack Maternal Grandfather    BP 112/70 mmHg  Pulse 70  Temp(Src) 97.7 F (36.5 C) (Oral)  Ht 5\' 7"  (1.702 m)  Wt 119 lb (53.978 kg)  BMI 18.63 kg/m2  SpO2 98%  Review of Systems denies depression, hair loss, muscle cramps, sob, numbness, blurry vision, cold intolerance, myalgias, dry skin, easy bruising, and syncope.  She has constipation, weight gain, and rhinorrhea.    Objective:   Physical Exam VS: see vs page GEN: no distress HEAD: head: no deformity eyes: no periorbital swelling, no proptosis external nose and ears are normal mouth: no lesion seen NECK: supple, thyroid is not enlarged CHEST WALL: no deformity LUNGS:  Clear to auscultation CV: reg rate and rhythm, no murmur ABD: abdomen is soft, nontender.  no hepatosplenomegaly.  not distended.  no hernia. MUSCULOSKELETAL: muscle bulk and strength are grossly normal.  no obvious joint swelling.  gait is normal and steady EXTEMITIES: no deformity.   no edema PULSES:  no carotid bruit NEURO:  cn 2-12 grossly intact.   readily moves all 4's.  sensation is intact to touch on all 4's. SKIN:  Normal texture and temperature.  No rash or  suspicious lesion is visible.   NODES:  None palpable at the neck PSYCH: alert, well-oriented.  Does not appear anxious nor depressed.    Radiol: i reviewed DEXA reports from 2011, 2012, and 2013  i have reviewed the following old records: neurol office note: pt was eval for numbness.  MRI of the brain was normal    Assessment & Plan:  Hypothyroidism, new to me.  i advised her to adjust synthroid according to TFT, rather than sxs.  i ordered TFT today  Patient is advised the following: There are no Patient Instructions on file for this visit., as computer was down.

## 2013-12-14 ENCOUNTER — Encounter: Payer: Self-pay | Admitting: Endocrinology

## 2013-12-14 DIAGNOSIS — E039 Hypothyroidism, unspecified: Secondary | ICD-10-CM | POA: Insufficient documentation

## 2013-12-15 LAB — T3, FREE: T3, Free: 2.9 pg/mL (ref 2.3–4.2)

## 2013-12-15 LAB — TSH: TSH: 0.21 u[IU]/mL — ABNORMAL LOW (ref 0.35–4.50)

## 2013-12-15 LAB — T4, FREE: FREE T4: 1.3 ng/dL (ref 0.60–1.60)

## 2013-12-31 ENCOUNTER — Encounter: Payer: Self-pay | Admitting: Endocrinology

## 2014-01-06 ENCOUNTER — Ambulatory Visit (INDEPENDENT_AMBULATORY_CARE_PROVIDER_SITE_OTHER): Payer: Medicare Other | Admitting: Podiatry

## 2014-01-06 DIAGNOSIS — M779 Enthesopathy, unspecified: Secondary | ICD-10-CM

## 2014-01-06 NOTE — Patient Instructions (Signed)

## 2014-01-06 NOTE — Progress Notes (Signed)
Pt presents for orthotic pick up, written and verbal instructions are given. She will follow up with [place] with any questions or concerns.

## 2014-01-13 ENCOUNTER — Encounter: Payer: Self-pay | Admitting: Endocrinology

## 2014-01-17 ENCOUNTER — Ambulatory Visit: Payer: Medicare Other | Admitting: Diagnostic Neuroimaging

## 2014-01-20 ENCOUNTER — Encounter: Payer: Self-pay | Admitting: Diagnostic Neuroimaging

## 2014-01-20 ENCOUNTER — Ambulatory Visit (INDEPENDENT_AMBULATORY_CARE_PROVIDER_SITE_OTHER): Payer: Medicare Other | Admitting: Diagnostic Neuroimaging

## 2014-01-20 VITALS — BP 123/57 | HR 86 | Temp 97.5°F | Wt 116.0 lb

## 2014-01-20 DIAGNOSIS — R208 Other disturbances of skin sensation: Secondary | ICD-10-CM

## 2014-01-20 DIAGNOSIS — R432 Parageusia: Secondary | ICD-10-CM

## 2014-01-20 DIAGNOSIS — R200 Anesthesia of skin: Secondary | ICD-10-CM

## 2014-01-20 NOTE — Patient Instructions (Signed)
Monitor symptoms. 

## 2014-01-20 NOTE — Progress Notes (Signed)
[facility]  PATIENT: Jessica Miles DOB: 05/06/1945  REFERRING CLINICIAN:  HISTORY FROM: patient  REASON FOR VISIT: follow up   HISTORICAL  CHIEF COMPLAINT:  Chief Complaint  Patient presents with  . Follow-up    HISTORY OF PRESENT ILLNESS:   UPDATE 01/20/14: Since last visit, foot numbness has slightly improved again. Taste loss is stable. Going to see [facility] smell/taste clinic in a few months. IBS symptoms stable.   UPDATE 10/16/13: Since last visit, numbness in feet slightly improved; continues on ALA, benfotamine, B12 supplements. Also has new problem, consistenting of reduced taste perception. Especially diff with sweet and sour. Smell is slightly reduced. She noticed this 3 weeks ago.   PRIOR HPI (08/20/13): 69 year old right-handed female with anxiety, tachycardia, hypothyroidism, IBS, here for evaluation of neuropathy. 5 months ago patient had significant increase in physical activity with increased exercise on a daily basis. End of June 2015 patient also started playing tennis. Within a few weeks patient developed numbness in her toes and feet. She felt a "pad sensation" on the balls of her feet. Patient also was developing some cramps in her calves. Patient went to PCP and podiatry, treated with metatarsal pads, cortisone shots in the feet, started on vitamin supplementation. Over the past few weeks patient symptoms have slightly improved. She still has some numbness on the bottom of her feet as well as some intermittent cramps. Patient is also to try to reduce her physical activity. Patient had blood testing by PCP with normal B12 level, and slightly low TSH. 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 Patient denies any neck or low back pain. No problems with her fingers or hands. No vision changes, slurred speech trouble talking headaches.   REVIEW OF SYSTEMS: Full 14 system review of systems performed and  notable only for numbness.  ALLERGIES: Allergies  Allergen Reactions  . Allegra [Fexofenadine Hcl]     Makes heart race   . Fexofenadine Palpitations    HOME MEDICATIONS: Outpatient Prescriptions Prior to Visit  Medication Sig Dispense Refill  . Alpha-Lipoic Acid 600 MG CAPS Take 1 capsule by mouth daily.    Marland Kitchen ascorbic acid (VITAMIN C) 500 MG tablet Take 500 mg by mouth 2 (two) times daily.     Marland Kitchen atenolol (TENORMIN) 25 MG tablet Take 25 mg by mouth daily as needed.     . BENFOTIAMINE PO Take 600 mg by mouth daily.    Marland Kitchen buPROPion (WELLBUTRIN SR) 100 MG 12 hr tablet Take 100 mg by mouth 2 (two) times daily.    . Calcium Carb-Cholecalciferol (CALCIUM-VITAMIN D) 600-400 MG-UNIT TABS Take 1 tablet by mouth 2 (two) times daily.    . Cyanocobalamin (VITAMIN B-12 PO) Take 2,500 mg by mouth daily.    . cycloSPORINE (RESTASIS) 0.05 % ophthalmic emulsion 1 drop 2 (two) times daily.    Marland Kitchen levothyroxine (SYNTHROID, LEVOTHROID) 88 MCG tablet Take 88 mcg by mouth daily before breakfast.    . Magnesium Oxide 500 MG TABS Take 1 tablet by mouth daily.    Marland Kitchen saccharomyces boulardii (FLORASTOR) 250 MG capsule Take by mouth 2 (two) times daily.     . Tobramycin-Dexamethasone (TOBRADEX OP) Apply to eye as needed.     . Zinc 100 MG TABS Take 1 tablet by mouth daily.     No facility-administered medications prior to visit.    PAST MEDICAL HISTORY: Past Medical History  Diagnosis Date  . Thyroid disease   .  Tachycardia   . Anxiety     PAST SURGICAL HISTORY: No past surgical history on file.  FAMILY HISTORY: Family History  Problem Relation Age of Onset  . Heart failure Mother   . Heart attack Father   . Heart attack Maternal Grandfather   . Thyroid disease Neg Hx     SOCIAL HISTORY:  History   Social History  . Marital Status: Married    Spouse Name: Mercie Eon. Schaible    Number of Children: 2  . Years of Education: MA   Occupational History  . Retired    Social History Main  Topics  . Smoking status: Never Smoker   . Smokeless tobacco: Never Used  . Alcohol Use: Yes     Comment: 2-3 glasses per week  . Drug Use: No  . Sexual Activity: Not on file   Other Topics Concern  . Not on file   Social History Narrative   Patient lives at home with spouse.   Caffeine Use: 1 cup daily     PHYSICAL EXAM  Filed Vitals:   01/20/14 0955  BP: 123/57  Pulse: 86  Temp: 97.5 F (36.4 C)  TempSrc: Oral  Weight: 116 lb (52.617 kg)    Not recorded      Body mass index is 18.16 kg/(m^2).  GENERAL EXAM: Patient is in no distress; well developed, nourished and groomed; neck is supple  CARDIOVASCULAR: Regular rate and rhythm, no murmurs, no carotid bruits  NEUROLOGIC: MENTAL STATUS: awake, alert, language fluent, comprehension intact, naming intact, fund of knowledge appropriate CRANIAL NERVE: pupils equal and reactive to light, visual fields full to confrontation, extraocular muscles intact, no nystagmus, facial sensation and strength symmetric, hearing intact, palate elevates symmetrically, uvula midline, shoulder shrug symmetric, tongue midline. MOTOR: normal bulk and tone, full strength in the BUE, BLE SENSORY: normal and symmetric to light touch, temp and vibration COORDINATION: finger-nose-finger, fine finger movements normal REFLEXES: deep tendon reflexes present and symmetric; TRACE AT ANKLES GAIT/STATION: narrow based gait; romberg is negative    DIAGNOSTIC DATA (LABS, IMAGING, TESTING) - I reviewed patient records, labs, notes, testing and imaging myself where available.  No results found for: WBC No results found for: NA No results found for: CHOL No results found for: HGBA1C No results found for: VITAMINB12 Lab Results  Component Value Date   TSH 0.21* 12/13/2013    B12 > 1999 TSH 0.144 ANA, SPEP (IFE), CRP, ESR - normal     ASSESSMENT AND PLAN  69 y.o. year old female here with numbness and tingling in toes, feet, since June  2015. Exam notable for decr PP, temp in distal feet / toes and trace reflexes at ankles. Now with decreased taste sensation since Sept/Oct 2015, which could also be neuropathy related.  Ddx: neuropathy (small fiber, large fiber, metabolic, autoimmune, inflamm, compressive, physical overuse, idiopathic)  PLAN: - monitor symptoms - offered to check EMG/NCS, CSF testing, nerve biopsy if symptoms progress; also could be evaluated at [facility]rom[facility] clinic for second opinion  Return in about 6 months (around 07/21/2014).   Penni Bombard, MD 09/03/2351, 61:44 AM Certified in Neurology, Neurophysiology and Neuroimaging  [facility] [address] [place], [place] [address] [phone]

## 2014-01-29 ENCOUNTER — Ambulatory Visit (INDEPENDENT_AMBULATORY_CARE_PROVIDER_SITE_OTHER): Payer: Medicare Other | Admitting: Podiatry

## 2014-01-29 VITALS — BP 85/49 | HR 95 | Resp 16

## 2014-01-29 DIAGNOSIS — M7750 Other enthesopathy of unspecified foot: Secondary | ICD-10-CM | POA: Diagnosis not present

## 2014-01-29 DIAGNOSIS — G629 Polyneuropathy, unspecified: Secondary | ICD-10-CM

## 2014-01-29 DIAGNOSIS — M778 Other enthesopathies, not elsewhere classified: Secondary | ICD-10-CM

## 2014-01-29 DIAGNOSIS — M779 Enthesopathy, unspecified: Secondary | ICD-10-CM

## 2014-01-29 NOTE — Progress Notes (Signed)
She presents today for follow-up of her orthotics she states that with her current medication in these orthotics I feel wonderful.  Objective: Vital signs are stable she is alert and oriented 3. No change in physical exam.  Assessment: Idiopathic neuropathy bilateral.  Plan: Order a second pair of orthotics.

## 2014-03-18 ENCOUNTER — Encounter: Payer: Self-pay | Admitting: Family Medicine

## 2014-03-18 ENCOUNTER — Ambulatory Visit (INDEPENDENT_AMBULATORY_CARE_PROVIDER_SITE_OTHER): Payer: Medicare Other | Admitting: Family Medicine

## 2014-03-18 VITALS — BP 108/60 | HR 70 | Temp 97.7°F | Ht 66.0 in | Wt 116.0 lb

## 2014-03-18 DIAGNOSIS — F329 Major depressive disorder, single episode, unspecified: Secondary | ICD-10-CM | POA: Diagnosis not present

## 2014-03-18 DIAGNOSIS — F331 Major depressive disorder, recurrent, moderate: Secondary | ICD-10-CM | POA: Insufficient documentation

## 2014-03-18 DIAGNOSIS — F32A Depression, unspecified: Secondary | ICD-10-CM

## 2014-03-18 DIAGNOSIS — M8580 Other specified disorders of bone density and structure, unspecified site: Secondary | ICD-10-CM | POA: Diagnosis not present

## 2014-03-18 DIAGNOSIS — E039 Hypothyroidism, unspecified: Secondary | ICD-10-CM

## 2014-03-18 DIAGNOSIS — K589 Irritable bowel syndrome without diarrhea: Secondary | ICD-10-CM

## 2014-03-18 DIAGNOSIS — M810 Age-related osteoporosis without current pathological fracture: Secondary | ICD-10-CM | POA: Insufficient documentation

## 2014-03-18 LAB — T3, FREE: T3 FREE: 2.9 pg/mL (ref 2.3–4.2)

## 2014-03-18 LAB — T4, FREE: Free T4: 1.3 ng/dL (ref 0.60–1.60)

## 2014-03-18 LAB — LIPID PANEL
Cholesterol: 184 mg/dL (ref 0–200)
HDL: 83.3 mg/dL (ref >39.00)
LDL Cholesterol: 91 mg/dL (ref 0–99)
NONHDL: 100.7
TRIGLYCERIDES: 49 mg/dL (ref 0.0–149.0)
Total CHOL/HDL Ratio: 2
VLDL: 9.8 mg/dL (ref 0.0–40.0)

## 2014-03-18 LAB — TSH: TSH: 0.1 u[IU]/mL — ABNORMAL LOW (ref 0.35–4.50)

## 2014-03-18 LAB — MAGNESIUM: Magnesium: 2.3 mg/dL (ref 1.5–2.5)

## 2014-03-18 NOTE — Progress Notes (Signed)
Subjective:   Patient ID: Jessica Miles, female    DOB: Sep 28, 1945, 69 y.o.   MRN: [id number]  Jessica Miles is a pleasant 69 y.o. year old female who presents to clinic today with [place]  on 03/18/2014  HPI: Hypothyroidism- was followed by endocrinology but prefers if I manage this. Alternating between synthroid 100 mcg and 88 mcg daily. Denies any symptoms of hypo or hyperthyroidism currently other than diarrhea.  Diarrhea- full work up with Dr. Tonna Boehringer per pt, including stool cultures and colonoscopy.  Then lost sense of taste- saw E[facility] at [facility], Dr. Wendie Chess,  who could not figure out why she lost sense of taste.  She can taste salt but nothing else.  Saw neurologist as well, Dr. Tish Frederickson. MRI of brain on 10/29/13- neg  Immunizations all UTD.  Depression- weaned off Wellbutrin and her other medications.  Wants to see "where my body is without medication." Has been going to weekly psychotherapy and seminars at [facility].  Doing well.  Current Outpatient Prescriptions on File Prior to Visit  Medication Sig Dispense Refill  . Alpha-Lipoic Acid 600 MG CAPS Take 1 capsule by mouth daily.    Marland Kitchen ascorbic acid (VITAMIN C) 500 MG tablet Take 500 mg by mouth 2 (two) times daily.     Marland Kitchen atenolol (TENORMIN) 25 MG tablet Take 25 mg by mouth daily as needed.     . BENFOTIAMINE PO Take 600 mg by mouth daily.    . Calcium Carb-Cholecalciferol (CALCIUM-VITAMIN D) 600-400 MG-UNIT TABS Take 1 tablet by mouth 2 (two) times daily.    . Cyanocobalamin (VITAMIN B-12 PO) Take 2,500 mg by mouth daily.    . cycloSPORINE (RESTASIS) 0.05 % ophthalmic emulsion 1 drop 2 (two) times daily.    Marland Kitchen levothyroxine (SYNTHROID, LEVOTHROID) 88 MCG tablet Take 88 mcg by mouth every other day. Take every other day, alternating with 1110mcg    . Magnesium Oxide 500 MG TABS Take 1 tablet by mouth daily.    . Tobramycin-Dexamethasone (TOBRADEX OP) Apply to eye as needed.      No current facility-administered medications on  file prior to visit.    Allergies  Allergen Reactions  . Allegra [Fexofenadine Hcl]     Makes heart race   . Fexofenadine Palpitations    Past Medical History  Diagnosis Date  . Thyroid disease   . Tachycardia   . Anxiety   . Tachycardia   . History of IBS   . Neuropathy 2015    surgery for Mortons neuroma in 1985  . Loss of taste   . HPV (human papilloma virus) infection   . Osteopenia     Past Surgical History  Procedure Laterality Date  . Foot neuroma surgery  1985  . Wisdom tooth extraction      Family History  Problem Relation Age of Onset  . Heart failure Mother   . Heart attack Mother   . Heart attack Father   . Heart attack Maternal Grandfather   . Thyroid disease Neg Hx     History   Social History  . Marital Status: Married    Spouse Name: Mercie Eon. Terrero  . Number of Children: 2  . Years of Education: MA   Occupational History  . Retired    Social History Main Topics  . Smoking status: Never Smoker   . Smokeless tobacco: Never Used  . Alcohol Use: Yes     Comment: 2-3 glasses per week  . Drug Use: No  .  Sexual Activity: Yes   Other Topics Concern  . Not on file   Social History Narrative   Patient lives at home with spouse.   Caffeine Use: 1 cup daily   The PMH, PSH, Social History, Family History, Medications, and allergies have been reviewed in [facility], and have been updated if relevant.   Review of Systems  Constitutional: Negative.   HENT: Negative.  Negative for sneezing, sore throat, trouble swallowing and voice change.   Respiratory: Negative.   Cardiovascular: Negative.   Gastrointestinal: Positive for diarrhea. Negative for nausea, vomiting, abdominal pain, constipation, blood in stool, abdominal distention, anal bleeding and rectal pain.  Endocrine: Negative.   Genitourinary: Negative.   Musculoskeletal: Negative.   Skin: Negative.   Allergic/Immunologic: Negative.   Neurological: Negative.   Hematological: Negative.    Psychiatric/Behavioral: Negative.   All other systems reviewed and are negative.      Objective:    BP 108/60 mmHg  Pulse 70  Temp(Src) 97.7 F (36.5 C) (Oral)  Ht 5\' 6"  (1.676 m)  Wt 116 lb (52.617 kg)  BMI 18.73 kg/m2  SpO2 99%   Physical Exam  Constitutional: She is oriented to person, place, and time. She appears well-developed and well-nourished. No distress.  HENT:  Head: Normocephalic.  Eyes: Conjunctivae are normal.  Neck: Normal range of motion.  Cardiovascular: Normal rate.   Pulmonary/Chest: Effort normal.  Musculoskeletal: Normal range of motion. She exhibits no edema.  Neurological: She is alert and oriented to person, place, and time. No cranial nerve deficit.  Skin: Skin is warm and dry.  Psychiatric: She has a normal mood and affect. Her behavior is normal. Judgment and thought content normal.  Nursing note and vitals reviewed.         Assessment & Plan:   Hypothyroidism, unspecified hypothyroidism type - Plan: TSH, T4, Free, T3, Free, Lipid panel  IBS (irritable bowel syndrome) - Plan: Magnesium No Follow-up on file.

## 2014-03-18 NOTE — Patient Instructions (Signed)
It was nice to meet you. Please stop taking Magnesium.  Calcium supplements have received some bad press lately, with questions that they may increase risk of heart attack or blood clots.  The risk is very low, however none of these risks occur with calcium in FOOD. Try to get most or all of your calcium from your food--aim for 1200 mg/day for women over 28 and men over 70.  To figure out dietary calcium: 300 mg/day from all non dairy foods plus 300 mg per cup of milk, other dairy, or fortified juice.  Non dairy foods that contain calcium:  Kale, oranges, sardines, oatmeal, soy milk/soybeans, salmon, white beans, dried figs, turnip greens, almonds, broccoli, tofu.  We will call you with your lab results.

## 2014-03-18 NOTE — Assessment & Plan Note (Signed)
>  30 minutes spent in face to face time with patient, >50% spent in counselling or coordination of care. Advised she d/c magnesium. Will check mag level today. The patient indicates understanding of these issues and agrees with the plan.

## 2014-03-18 NOTE — Assessment & Plan Note (Signed)
Due for labs. Recheck today.

## 2014-03-18 NOTE — Progress Notes (Signed)
Pre visit review using our clinic review tool, if applicable. No additional management support is needed unless otherwise documented below in the visit note. 

## 2014-03-18 NOTE — Assessment & Plan Note (Signed)
Stable off rx. Continue psychotherapy.

## 2014-03-18 NOTE — Assessment & Plan Note (Signed)
Very active.  Exercises 2 hours per day. Discussed getting calcium in diet- see AVS.

## 2014-03-19 ENCOUNTER — Encounter: Payer: Self-pay | Admitting: Family Medicine

## 2014-03-20 ENCOUNTER — Encounter: Payer: Self-pay | Admitting: Family Medicine

## 2014-03-21 ENCOUNTER — Encounter: Payer: Self-pay | Admitting: Family Medicine

## 2014-03-21 MED ORDER — LEVOTHYROXINE SODIUM 88 MCG PO TABS: ORAL_TABLET | ORAL | Status: DC

## 2014-03-21 MED ORDER — LEVOTHYROXINE SODIUM 100 MCG PO TABS: ORAL_TABLET | ORAL | Status: DC

## 2014-03-27 ENCOUNTER — Ambulatory Visit (INDEPENDENT_AMBULATORY_CARE_PROVIDER_SITE_OTHER): Payer: Medicare Other | Admitting: Internal Medicine

## 2014-03-27 ENCOUNTER — Encounter: Payer: Self-pay | Admitting: Internal Medicine

## 2014-03-27 VITALS — BP 104/66 | HR 74 | Temp 97.9°F | Wt 119.0 lb

## 2014-03-27 DIAGNOSIS — N3090 Cystitis, unspecified without hematuria: Secondary | ICD-10-CM

## 2014-03-27 DIAGNOSIS — R3915 Urgency of urination: Secondary | ICD-10-CM

## 2014-03-27 LAB — POCT URINALYSIS DIPSTICK
BILIRUBIN UA: NEGATIVE
Blood, UA: NEGATIVE
Glucose, UA: NEGATIVE
Ketones, UA: NEGATIVE
Leukocytes, UA: NEGATIVE
Nitrite, UA: NEGATIVE
PROTEIN UA: NEGATIVE
Spec Grav, UA: 1.015
Urobilinogen, UA: NEGATIVE
pH, UA: 6

## 2014-03-27 MED ORDER — PHENAZOPYRIDINE HCL 100 MG PO TABS: 100.0000 mg | ORAL_TABLET | Freq: Three times a day (TID) | ORAL | Status: DC

## 2014-03-27 NOTE — Progress Notes (Signed)
HPI  Pt presents to the clinic today with c/o urgency, dysuria and vaginal irritation. She denies blood in her urine, nausea or vomiting. She denies low back pain. She has not noticed any vaginal discharge or odor. She does have slightly painful intercourse but reports that is not new, has been that way since she went through menopause. She has been taking AZO and Premarin cram with Lidocaine with some relief. She has had UTI's in the past and reports this feels the same.    Review of Systems  Past Medical History  Diagnosis Date  . Thyroid disease   . Tachycardia   . Anxiety   . Tachycardia   . History of IBS   . Neuropathy 2015    surgery for Mortons neuroma in 1985  . Loss of taste   . HPV (human papilloma virus) infection   . Osteopenia     Family History  Problem Relation Age of Onset  . Heart failure Mother   . Heart attack Mother   . Heart attack Father   . Heart attack Maternal Grandfather   . Thyroid disease Neg Hx     History   Social History  . Marital Status: Married    Spouse Name: Mercie Eon. Mcmackin  . Number of Children: 2  . Years of Education: MA   Occupational History  . Retired    Social History Main Topics  . Smoking status: Never Smoker   . Smokeless tobacco: Never Used  . Alcohol Use: Yes     Comment: 2-3 glasses per week  . Drug Use: No  . Sexual Activity: Yes   Other Topics Concern  . Not on file   Social History Narrative   Patient lives at [place] with spouse.   Caffeine Use: 1 cup daily   2 kids- 4 grandchildren- two boys and two girls          Allergies  Allergen Reactions  . Allegra [Fexofenadine Hcl]     Makes heart race   . Fexofenadine Palpitations    Constitutional: Denies fever, malaise, fatigue, headache or abrupt weight changes.   GU: Pt reports urgency, frequency, dysuria and vaginal irritation. Denies burning sensation, blood in urine, odor or discharge. Skin: Denies redness, rashes, lesions or ulcercations.    No other specific complaints in a complete review of systems (except as listed in HPI above).    Objective:   Physical Exam  BP 104/66 mmHg  Pulse 74  Temp(Src) 97.9 F (36.6 C) (Oral)  Wt 119 lb (53.978 kg)  SpO2 98% Wt Readings from Last 3 Encounters:  03/27/14 119 lb (53.978 kg)  03/18/14 116 lb (52.617 kg)  01/20/14 116 lb (52.617 kg)    General: Appears her stated age, well developed, well nourished in NAD. Cardiovascular: Normal rate and rhythm. S1,S2 noted.  No murmur, rubs or gallops noted.  Pulmonary/Chest: Normal effort and positive vesicular breath sounds. No respiratory distress. No wheezes, rales or ronchi noted.  Abdomen: Soft and nontender. Normal bowel sounds, no bruits noted. No distention or masses noted.  No CVA tenderness. Pelvic: Normal female anatomy.Urethral irritation noted. No vaginal discharge noted. No rash noted on the outer labia.      Assessment & Plan:   Urgency, Frequency, Dysuria secondary to Cystitis  Urinalysis: normal eRx sent if for Pyridium 100 mg BID x 10 days Drink plenty of fluids  RTC as needed or if symptoms persist.

## 2014-03-27 NOTE — Patient Instructions (Signed)

## 2014-03-27 NOTE — Progress Notes (Signed)
Pre visit review using our clinic review tool, if applicable. No additional management support is needed unless otherwise documented below in the visit note. 

## 2014-07-03 ENCOUNTER — Encounter: Payer: Self-pay | Admitting: Family Medicine

## 2014-07-03 ENCOUNTER — Ambulatory Visit (INDEPENDENT_AMBULATORY_CARE_PROVIDER_SITE_OTHER): Payer: Medicare Other | Admitting: Family Medicine

## 2014-07-03 ENCOUNTER — Ambulatory Visit (INDEPENDENT_AMBULATORY_CARE_PROVIDER_SITE_OTHER)
Admission: RE | Admit: 2014-07-03 | Discharge: 2014-07-03 | Disposition: A | Discharge status: Home / Self Care | Payer: Medicare Other | Source: Ambulatory Visit | Attending: Family Medicine | Admitting: Family Medicine

## 2014-07-03 VITALS — BP 108/62 | HR 84 | Temp 98.1°F | Wt 120.0 lb

## 2014-07-03 DIAGNOSIS — M79642 Pain in left hand: Secondary | ICD-10-CM | POA: Diagnosis not present

## 2014-07-03 NOTE — Progress Notes (Signed)
Subjective:   Patient ID: Jessica Miles, female    DOB: 1945/02/03, 69 y.o.   MRN: [id number]  Inella Kuwahara is a pleasant 69 y.o. year old female who presents to clinic today with Hand Pain  on 07/03/2014  HPI:  Left hand pain weakness- noticed it for at least a month. She is very active at the gym.  Has been doing spinning classes and lifting weights along with doing pull ups.  Noticing pain along lateral aspect of 3rd digit and over past few days, feels her grip strength has decreased.  Dropped a cup of coffee the other day while pouring coffee.  Sometimes she feels a burning in the same area.  No known injury.  Current Outpatient Prescriptions on File Prior to Visit  Medication Sig Dispense Refill  . Alpha-Lipoic Acid 600 MG CAPS Take 1 capsule by mouth daily.    Marland Kitchen ascorbic acid (VITAMIN C) 500 MG tablet Take 500 mg by mouth 2 (two) times daily.     Marland Kitchen atenolol (TENORMIN) 25 MG tablet Take 25 mg by mouth daily as needed.     . BENFOTIAMINE PO Take 300 mg by mouth daily.     . Calcium Carb-Cholecalciferol (CALCIUM-VITAMIN D) 600-400 MG-UNIT TABS Take 1 tablet by mouth 2 (two) times daily.    . Cyanocobalamin (VITAMIN B-12 PO) Take 2,500 mg by mouth daily.    . cycloSPORINE (RESTASIS) 0.05 % ophthalmic emulsion 1 drop 2 (two) times daily.    Marland Kitchen levothyroxine (SYNTHROID, LEVOTHROID) 100 MCG tablet Take every other day, alternating with 16mcg 45 tablet 1  . levothyroxine (SYNTHROID, LEVOTHROID) 88 MCG tablet Take every other day, alternating with 117mcg 45 tablet 1  . Magnesium Oxide 500 MG TABS Take 1 tablet by mouth daily.    . phenazopyridine (PYRIDIUM) 100 MG tablet Take 1 tablet (100 mg total) by mouth 3 (three) times daily with meals. 30 tablet 0  . Tobramycin-Dexamethasone (TOBRADEX OP) Apply to eye as needed.      No current facility-administered medications on file prior to visit.    Allergies  Allergen Reactions  . Allegra [Fexofenadine Hcl]     Makes heart race   .  Fexofenadine Palpitations    Past Medical History  Diagnosis Date  . Thyroid disease   . Tachycardia   . Anxiety   . Tachycardia   . History of IBS   . Neuropathy 2015    surgery for Mortons neuroma in 1985  . Loss of taste   . HPV (human papilloma virus) infection   . Osteopenia     Past Surgical History  Procedure Laterality Date  . Foot neuroma surgery  1985  . Wisdom tooth extraction      Family History  Problem Relation Age of Onset  . Heart failure Mother   . Heart attack Mother   . Heart attack Father   . Heart attack Maternal Grandfather   . Thyroid disease Neg Hx     History   Social History  . Marital Status: Married    Spouse Name: Mercie Eon. Lakatos  . Number of Children: 2  . Years of Education: MA   Occupational History  . Retired    Social History Main Topics  . Smoking status: Never Smoker   . Smokeless tobacco: Never Used  . Alcohol Use: Yes     Comment: 2-3 glasses per week  . Drug Use: No  . Sexual Activity: Yes   Other Topics Concern  . Not  on file   Social History Narrative   Patient lives at [place] with spouse.   Caffeine Use: 1 cup daily   2 kids- 4 grandchildren- two boys and two girls         The PMH, PSH, Social History, Family History, Medications, and allergies have been reviewed in [facility], and have been updated if relevant.   Review of Systems  Musculoskeletal: Positive for myalgias.  Neurological: Positive for weakness. Negative for tremors, seizures, syncope and speech difficulty.  All other systems reviewed and are negative.      Objective:    BP 108/62 mmHg  Pulse 84  Temp(Src) 98.1 F (36.7 C) (Oral)  Wt 120 lb (54.432 kg)  SpO2 97%   Physical Exam  Constitutional: She is oriented to person, place, and time. She appears well-developed and well-nourished. No distress.  HENT:  Head: Normocephalic.  Eyes: Conjunctivae are normal. Pupils are equal, round, and reactive to light.  Cardiovascular: Normal  rate.   Pulmonary/Chest: Effort normal.  Musculoskeletal:       Left hand: She exhibits tenderness and bony tenderness. She exhibits normal two-point discrimination. Normal sensation noted.  Mildly decreased grip strength left- ? More due to pain  Neurological: She is alert and oriented to person, place, and time. No cranial nerve deficit.  Skin: Skin is warm and dry.  Psychiatric: She has a normal mood and affect. Her behavior is normal. Judgment and thought content normal.  Vitals reviewed.         Assessment & Plan:   No diagnosis found. No Follow-up on file.

## 2014-07-03 NOTE — Progress Notes (Signed)
Pre visit review using our clinic review tool, if applicable. No additional management support is needed unless otherwise documented below in the visit note. 

## 2014-07-03 NOTE — Patient Instructions (Signed)
Great to see you. Let's back off on weight lifting for now.  We will call you with your xray results.  Please make an appointment to see Dr. Lorelei Pont on your way out.

## 2014-07-03 NOTE — Assessment & Plan Note (Signed)
New- progressive. Advised to stop lifting weights, including her own body weight for now. ?tendonitis but given weakness and duration of symptoms, this does warrant further investigation. Hand xray today, follow up with Dr. Lorelei Pont. May even consider neuro for EMG- will await Dr. Lillie Fragmin recs first. The patient indicates understanding of these issues and agrees with the plan.  Orders Placed This Encounter  Procedures  . DG Hand Complete Left

## 2014-07-07 ENCOUNTER — Encounter: Payer: Self-pay | Admitting: Family Medicine

## 2014-07-07 ENCOUNTER — Ambulatory Visit (INDEPENDENT_AMBULATORY_CARE_PROVIDER_SITE_OTHER): Payer: Medicare Other | Admitting: Family Medicine

## 2014-07-07 ENCOUNTER — Ambulatory Visit: Payer: Medicare Other | Admitting: Family Medicine

## 2014-07-07 VITALS — BP 100/58 | HR 66 | Temp 98.6°F | Ht 66.0 in | Wt 119.0 lb

## 2014-07-07 DIAGNOSIS — G5610 Other lesions of median nerve, unspecified upper limb: Secondary | ICD-10-CM | POA: Diagnosis not present

## 2014-07-07 NOTE — Progress Notes (Signed)
Pre visit review using our clinic review tool, if applicable. No additional management support is needed unless otherwise documented below in the visit note. 

## 2014-07-07 NOTE — Progress Notes (Signed)
Dr. Frederico Hamman T. Rinaldo Macqueen, MD, [facility] [place], [address] Phone: [phone] Fax: [phone]  07/07/2014  Patient: Jessica Miles, MRN: [id number], DOB: Feb 27, 1945, 69 y.o.  Primary Physician:  Arnette Norris, MD  Chief Complaint: Hand Pain  Subjective:   Jessica Miles is a 69 y.o. very pleasant female patient who presents with the following:  L hand, with lifting weghts and lifting wrist on the left aide and three weeks ago or so, noticed a burning sensation up to the 3rd and medial. She also has noted some decrease in her strength.  Had some difficulty lifting and picking up a frying pain and burning sensation. Tried some hand grips - also.   Goes to the [facility] in [place].  TRX, lifting several times a week. Ellipitical for 1 hour.    Past Medical History, Surgical History, Social History, Family History, Problem List, Medications, and Allergies have been reviewed and updated if relevant.  Patient Active Problem List   Diagnosis Date Noted  . Left hand pain 07/03/2014  . IBS (irritable bowel syndrome) 03/18/2014  . Osteopenia 03/18/2014  . Depression 03/18/2014  . Hypothyroidism 12/14/2013    Past Medical History  Diagnosis Date  . Thyroid disease   . Tachycardia   . Anxiety   . Tachycardia   . History of IBS   . Neuropathy 2015    surgery for Mortons neuroma in 1985  . Loss of taste   . HPV (human papilloma virus) infection   . Osteopenia     Past Surgical History  Procedure Laterality Date  . Foot neuroma surgery  1985  . Wisdom tooth extraction      History   Social History  . Marital Status: Married    Spouse Name: Mercie Eon. [place]  . Number of Children: 2  . Years of Education: MA   Occupational History  . Retired    Social History Main Topics  . Smoking status: Never Smoker   . Smokeless tobacco: Never Used  . Alcohol Use: Yes     Comment: 2-3 glasses per week  . Drug Use: No    . Sexual Activity: Yes   Other Topics Concern  . Not on file   Social History Narrative   Patient lives at [place] with spouse.   Caffeine Use: 1 cup daily   2 kids- 4 grandchildren- two boys and two girls          Family History  Problem Relation Age of Onset  . Heart failure Mother   . Heart attack Mother   . Heart attack Father   . Heart attack Maternal Grandfather   . Thyroid disease Neg Hx     Allergies  Allergen Reactions  . Allegra [Fexofenadine Hcl]     Makes heart race   . Fexofenadine Palpitations    Medication list reviewed and updated in full in [facility].  GEN: No fevers, chills. Nontoxic. Primarily MSK c/o today. MSK: Detailed in the HPI GI: tolerating PO intake without difficulty Neuro: No numbness, parasthesias, or tingling associated. Otherwise the pertinent positives of the ROS are noted above.   Objective:   BP 100/58 mmHg  Pulse 66  Temp(Src) 98.6 F (37 C) (Oral)  Ht 5\' 6"  (1.676 m)  Wt 119 lb (53.978 kg)  BMI 19.22 kg/m2  SpO2 97%   GEN: WDWN, NAD, Non-toxic, Alert & Oriented x 3 HEENT: Atraumatic, Normocephalic.  Ears and Nose:  No external deformity. EXTR: No clubbing/cyanosis/edema NEURO: Normal gait.  PSYCH: Normally interactive. Conversant. Not depressed or anxious appearing.  Calm demeanor.   L hand Ecchymosis or edema: neg ROM wrist/hand/digits: full  Carpals, MCP's, digits: NT Distal Ulna and Radius: NT Ecchymosis or edema: neg No instability Cysts/nodules: neg Digit triggering: neg Finkelstein's test: neg Snuffbox tenderness: neg Scaphoid tubercle: NT Resisted supination: NT Full composite fist, no malrotation Grip, all digits: 4/5 str on the L DIPJT: NT PIP JT: NT MCP JT: NT No tenosynovitis Axial load test: neg Phalen's: neg Tinel's: neg Atrophy: neg  Hand sensation: decreased to touch medial 3rd digit  Radiology: Dg Hand Complete Left  07/03/2014   CLINICAL DATA:  Pain third digit after  exercise.  Initial evaluation  EXAM: LEFT HAND - COMPLETE 3+ VIEW  COMPARISON:  None.  FINDINGS: Mild diffuse degenerative change. Normal mineralization. No acute bony or joint abnormality. No radiopaque foreign body.  IMPRESSION: Mild degenerative change.  No acute abnormality.   Electronically Signed   By: Marcello Moores  Register   On: 07/03/2014 08:29    Assessment and Plan:   Anterior interosseous neuropathy, unspecified laterality  Most likely small nerve, prevention entrapment, more likely AIN.  She is very active, has been more active recently and she is working out 6 days a week.  I reviewed various way she can alter her lifts and programs, to try to decrease the stress on the hand and wrist.  I suspect this will be all that is needed.  The patient does not even want to take even an anti-inflammatory or Tylenol.  I think that is reasonable.  Certainly, there are many other things that can be done to further evaluate this or treat this, but proceeding conservatively seems quite appropriate.  If she worsens, EMGs could be done, and he could potentially try to do a steroid injection at the digital nerve.  Signed,  Maud Deed. Mayline Dragon, MD   Patient's Medications  New Prescriptions   No medications on file  Previous Medications   ALPHA-LIPOIC ACID 600 MG CAPS    Take 1 capsule by mouth daily.   ASCORBIC ACID (VITAMIN C) 500 MG TABLET    Take 500 mg by mouth 2 (two) times daily.    ATENOLOL (TENORMIN) 25 MG TABLET    Take 25 mg by mouth daily as needed.    BENFOTIAMINE PO    Take 300 mg by mouth daily.    CALCIUM CARB-CHOLECALCIFEROL (CALCIUM-VITAMIN D) 600-400 MG-UNIT TABS    Take 1 tablet by mouth 2 (two) times daily.   CYANOCOBALAMIN (VITAMIN B-12 PO)    Take 2,500 mg by mouth daily.   CYCLOSPORINE (RESTASIS) 0.05 % OPHTHALMIC EMULSION    1 drop 2 (two) times daily.   LEVOTHYROXINE (SYNTHROID, LEVOTHROID) 100 MCG TABLET    Take every other day, alternating with 51mcg   LEVOTHYROXINE  (SYNTHROID, LEVOTHROID) 88 MCG TABLET    Take every other day, alternating with 125mcg   MAGNESIUM OXIDE 500 MG TABS    Take 1 tablet by mouth daily.   PHENAZOPYRIDINE (PYRIDIUM) 100 MG TABLET    Take 1 tablet (100 mg total) by mouth 3 (three) times daily with meals.   TOBRAMYCIN-DEXAMETHASONE (TOBRADEX OP)    Apply to eye as needed.   Modified Medications   No medications on file  Discontinued Medications   No medications on file

## 2014-07-12 ENCOUNTER — Emergency Department: Payer: Medicare Other

## 2014-07-12 ENCOUNTER — Emergency Department
Admission: EM | Admit: 2014-07-12 | Discharge: 2014-07-12 | Disposition: A | Discharge status: Home / Self Care | Payer: Medicare Other | Attending: Emergency Medicine | Admitting: Emergency Medicine

## 2014-07-12 ENCOUNTER — Encounter: Payer: Self-pay | Admitting: Emergency Medicine

## 2014-07-12 DIAGNOSIS — W010XXA Fall on same level from slipping, tripping and stumbling without subsequent striking against object, initial encounter: Secondary | ICD-10-CM | POA: Diagnosis not present

## 2014-07-12 DIAGNOSIS — Y9289 Other specified places as the place of occurrence of the external cause: Secondary | ICD-10-CM | POA: Insufficient documentation

## 2014-07-12 DIAGNOSIS — F419 Anxiety disorder, unspecified: Secondary | ICD-10-CM | POA: Diagnosis not present

## 2014-07-12 DIAGNOSIS — S92901A Unspecified fracture of right foot, initial encounter for closed fracture: Secondary | ICD-10-CM

## 2014-07-12 DIAGNOSIS — Y9389 Activity, other specified: Secondary | ICD-10-CM | POA: Diagnosis not present

## 2014-07-12 DIAGNOSIS — E0500 Thyrotoxicosis with diffuse goiter without thyrotoxic crisis or storm: Secondary | ICD-10-CM | POA: Diagnosis not present

## 2014-07-12 DIAGNOSIS — Z79899 Other long term (current) drug therapy: Secondary | ICD-10-CM | POA: Diagnosis not present

## 2014-07-12 DIAGNOSIS — Y998 Other external cause status: Secondary | ICD-10-CM | POA: Insufficient documentation

## 2014-07-12 DIAGNOSIS — S99921A Unspecified injury of right foot, initial encounter: Secondary | ICD-10-CM | POA: Diagnosis present

## 2014-07-12 DIAGNOSIS — S92354A Nondisplaced fracture of fifth metatarsal bone, right foot, initial encounter for closed fracture: Secondary | ICD-10-CM | POA: Diagnosis not present

## 2014-07-12 MED ORDER — TRAMADOL HCL 50 MG PO TABS: 50.0000 mg | ORAL_TABLET | Freq: Four times a day (QID) | ORAL | Status: DC | PRN

## 2014-07-12 NOTE — Discharge Instructions (Signed)
Ambulate with support until evlaution by Ortho clinic.  Call Tuesday morning for appointment. Cast or Splint Care Casts and splints support injured limbs and keep bones from moving while they heal.  HOME CARE  Keep the cast or splint uncovered during the drying period.  A plaster cast can take 24 to 48 hours to dry.  A fiberglass cast will dry in less than 1 hour.  Do not rest the cast on anything harder than a pillow for 24 hours.  Do not put weight on your injured limb. Do not put pressure on the cast. Wait for your doctor's approval.  Keep the cast or splint dry.  Cover the cast or splint with a plastic bag during baths or wet weather.  If you have a cast over your chest and belly (trunk), take sponge baths until the cast is taken off.  If your cast gets wet, dry it with a towel or blow dryer. Use the cool setting on the blow dryer.  Keep your cast or splint clean. Wash a dirty cast with a damp cloth.  Do not put any objects under your cast or splint.  Do not scratch the skin under the cast with an object. If itching is a problem, use a blow dryer on a cool setting over the itchy area.  Do not trim or cut your cast.  Do not take out the padding from inside your cast.  Exercise your joints near the cast as told by your doctor.  Raise (elevate) your injured limb on 1 or 2 pillows for the first 1 to 3 days. GET HELP IF:  Your cast or splint cracks.  Your cast or splint is too tight or too loose.  You itch badly under the cast.  Your cast gets wet or has a soft spot.  You have a bad smell coming from the cast.  You get an object stuck under the cast.  Your skin around the cast becomes red or sore.  You have new or more pain after the cast is put on. GET HELP RIGHT AWAY IF:  You have fluid leaking through the cast.  You cannot move your fingers or toes.  Your fingers or toes turn blue or white or are cool, painful, or puffy (swollen).  You have tingling or  lose feeling (numbness) around the injured area.  You have bad pain or pressure under the cast.  You have trouble breathing or have shortness of breath.  You have chest pain. Document Released: 04/28/2010 Document Revised: 08/29/2012 Document Reviewed: 07/05/2012 [facility] Patient Information 2015 [place], [place]. This information is not intended to replace advice given to you by your health care provider. Make sure you discuss any questions you have with your health care provider.

## 2014-07-12 NOTE — ED Notes (Signed)
Patient presents to the ED with right foot pain after falling.  Patient states she was running after a squirrel and she tripped and fell, patient states foot went under her.  Patient denies hitting her head or passing out.  Patient is alert and oriented and pleasant.  Patient states she has been putting ice on her foot and it is still very painful.  Patient using crutches to walk to registration.

## 2014-07-12 NOTE — ED Provider Notes (Signed)
[facility] Emergency Department Provider Note  ____________________________________________  Time seen: Approximately 11:28 AM  I have reviewed the triage vital signs and the nursing notes.   HISTORY  Chief Complaint Foot Pain    HPI Jessica Miles is a 69 y.o. female complaining of right ankle pain is secondary to a fall. Patient tripped while chasing a squirrel with mom. Patient her right foot went under her and she heard a popping sensation. Patient denies any head injury from this fall which occurred at 0900 hrs. today. .  Patient states since the fall she had an ice pack on the ankle. She rated her discomfort as a 3/10. Patient has been ambulating with supportive crutches since the incident..  Past Medical History  Diagnosis Date  . Thyroid disease   . Tachycardia   . Anxiety   . Tachycardia   . History of IBS   . Neuropathy 2015    surgery for Mortons neuroma in 1985  . Loss of taste   . HPV (human papilloma virus) infection   . Osteopenia     Patient Active Problem List   Diagnosis Date Noted  . Left hand pain 07/03/2014  . IBS (irritable bowel syndrome) 03/18/2014  . Osteopenia 03/18/2014  . Depression 03/18/2014  . Hypothyroidism 12/14/2013    Past Surgical History  Procedure Laterality Date  . Foot neuroma surgery  1985  . Wisdom tooth extraction      Current Outpatient Rx  Name  Route  Sig  Dispense  Refill  . Alpha-Lipoic Acid 600 MG CAPS   Oral   Take 1 capsule by mouth daily.         Marland Kitchen ascorbic acid (VITAMIN C) 500 MG tablet   Oral   Take 500 mg by mouth 2 (two) times daily.          Marland Kitchen atenolol (TENORMIN) 25 MG tablet   Oral   Take 25 mg by mouth daily as needed.          . BENFOTIAMINE PO   Oral   Take 300 mg by mouth daily.          . Calcium Carb-Cholecalciferol (CALCIUM-VITAMIN D) 600-400 MG-UNIT TABS   Oral   Take 1 tablet by mouth 2 (two) times daily.         . Cyanocobalamin (VITAMIN B-12 PO)    Oral   Take 2,500 mg by mouth daily.         . cycloSPORINE (RESTASIS) 0.05 % ophthalmic emulsion      1 drop 2 (two) times daily.         Marland Kitchen levothyroxine (SYNTHROID, LEVOTHROID) 100 MCG tablet      Take every other day, alternating with 39mcg   45 tablet   1   . levothyroxine (SYNTHROID, LEVOTHROID) 88 MCG tablet      Take every other day, alternating with 138mcg   45 tablet   1   . Magnesium Oxide 500 MG TABS   Oral   Take 1 tablet by mouth daily.         . phenazopyridine (PYRIDIUM) 100 MG tablet   Oral   Take 1 tablet (100 mg total) by mouth 3 (three) times daily with meals.   30 tablet   0   . Tobramycin-Dexamethasone (TOBRADEX OP)   Ophthalmic   Apply to eye as needed.            Allergies Allegra and Fexofenadine  Family History  Problem  Relation Age of Onset  . Heart failure Mother   . Heart attack Mother   . Heart attack Father   . Heart attack Maternal Grandfather   . Thyroid disease Neg Hx     Social History History  Substance Use Topics  . Smoking status: Never Smoker   . Smokeless tobacco: Never Used  . Alcohol Use: Yes     Comment: 2-3 glasses per week    Review of Systems Constitutional: No fever/chills Eyes: No visual changes. ENT: No sore throat. Cardiovascular: Denies chest pain. Respiratory: Denies shortness of breath. Gastrointestinal: No abdominal pain.  No nausea, no vomiting.  No diarrhea.  No constipation. Genitourinary: Negative for dysuria. Musculoskeletal: Right lateral ankle pain. Skin: Negative for rash. Neurological: Negative for headaches, focal weakness or numbness. Psychiatric:Anxiety Endocrine:Graves' disease Hematological/Lymphatic: Allergic/Immunilogical: See medication list 10-point ROS otherwise negative.  ____________________________________________   PHYSICAL EXAM:  VITAL SIGNS: ED Triage Vitals  Enc Vitals Group     BP 07/12/14 1039 124/62 mmHg     Pulse Rate 07/12/14 1039 84      Resp 07/12/14 1039 16     Temp 07/12/14 1039 98.1 F (36.7 C)     Temp Source 07/12/14 1039 Oral     SpO2 07/12/14 1039 97 %     Weight 07/12/14 1039 117 lb (53.071 kg)     Height 07/12/14 1039 5\' 7"  (1.702 m)     Head Cir --      Peak Flow --      Pain Score 07/12/14 1046 3     Pain Loc --      Pain Edu? --      Excl. in [place]? --     Constitutional: Alert and oriented. Well appearing and in no acute distress. Eyes: Conjunctivae are normal. PERRL. EOMI. Head: Atraumatic. Nose: No congestion/rhinnorhea. Mouth/Throat: Mucous membranes are moist.  Oropharynx non-erythematous. Neck: No stridor. *No cervical spine tenderness to palpation. Hematological/Lymphatic/Immunilogical: No cervical lymphadenopathy. Cardiovascular: Normal rate, regular rhythm. Grossly normal heart sounds.  Good peripheral circulation. Respiratory: Normal respiratory effort.  No retractions. Lungs CTAB. Gastrointestinal: Soft and nontender. No distention. No abdominal bruits. No CVA tenderness. Musculoskeletal: Mild edema to the lateral aspect of the right ankle. No obvious deformity neurovascular intact. Neurologic:  Normal speech and language. No gross focal neurologic deficits are appreciated. Speech is normal. No gait instability. Skin:  Skin is warm, dry and intact. No rash noted. Psychiatric: Mood and affect are normal. Speech and behavior are normal.  ____________________________________________   LABS (all labs ordered are listed, but only abnormal results are displayed)  Labs Reviewed - No data to display ____________________________________________  EKG   ____________________________________________  RADIOLOGY  5th Metatarsal head fracture.   I, Sable Feil, personally viewed and evaluated these images as part of my medical decision making.  ____________________________________________   PROCEDURES  Procedure(s) performed: None  Critical Care performed:  No  ____________________________________________   INITIAL IMPRESSION / ASSESSMENT AND PLAN / ED COURSE  Pertinent labs & imaging results that were available during my care of the patient were reviewed by me and considered in my medical decision making (see chart for details).  Nondisplaced right fifth metatarsal head fracture. Patient placed in a posterior splint and follow orthopedics on 07/15/2014. Patient given prescription for tramadol to take as needed for pain. ____________________________________________   FINAL CLINICAL IMPRESSION(S) / ED DIAGNOSES  Final diagnoses:  Foot fracture, right, closed, initial encounter      Sable Feil, PA-C 07/12/14  [place], MD 07/12/14 [phone]

## 2014-07-29 DIAGNOSIS — M79673 Pain in unspecified foot: Secondary | ICD-10-CM

## 2014-09-19 ENCOUNTER — Other Ambulatory Visit: Payer: Self-pay | Admitting: Family Medicine

## 2014-10-23 ENCOUNTER — Ambulatory Visit: Payer: Medicare Other

## 2014-12-24 ENCOUNTER — Ambulatory Visit (INDEPENDENT_AMBULATORY_CARE_PROVIDER_SITE_OTHER): Payer: Medicare Other | Admitting: Podiatry

## 2014-12-24 ENCOUNTER — Ambulatory Visit (INDEPENDENT_AMBULATORY_CARE_PROVIDER_SITE_OTHER): Payer: Medicare Other

## 2014-12-24 ENCOUNTER — Encounter: Payer: Self-pay | Admitting: Podiatry

## 2014-12-24 VITALS — BP 103/53 | HR 81 | Resp 18

## 2014-12-24 DIAGNOSIS — M779 Enthesopathy, unspecified: Secondary | ICD-10-CM

## 2014-12-24 DIAGNOSIS — M109 Gout, unspecified: Secondary | ICD-10-CM | POA: Diagnosis not present

## 2014-12-24 MED ORDER — DICLOFENAC SODIUM 1 % TD GEL: 4.0000 g | Freq: Four times a day (QID) | TRANSDERMAL | Status: DC

## 2014-12-24 NOTE — Progress Notes (Signed)
She presents today with a chief complaint of a painful swollen hallux left that was red and painful after exercise last week. She states is still very tender as she points to the dorsomedial aspect of the first metatarsophalangeal joint left foot. She states that it feels like there is a fishing line stretched across the joint. She denies any trauma to the joint.  Objective: Vital signs are stable she is alert and 3 have reviewed her past mental history medications allergy surgeries and social history. Pulses are strongly palpable. She has pain on palpation and in range of motion of the first metatarsophalangeal joint of the left foot. Radiographs 3 views taken today in the office demonstrate what appears to be joint space narrowing and some fluid collection overlying the first metatarsophalangeal joint on lateral view. No fractures identified. Retaining his evaluation demonstrates supple well-hydrated cutis no erythema edema saline as drainage or odor.  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  Plan: Injected the joint today after sterile Betadine skin prep with 2 mg of dexamethasone and local anesthetic. This should help alleviate her symptoms. I also wrote a prescription for diclofenac gel. I will follow-up with her as needed.

## 2015-02-09 ENCOUNTER — Encounter: Payer: Self-pay | Admitting: Family Medicine

## 2015-02-09 MED ORDER — LEVOTHYROXINE SODIUM 100 MCG PO TABS: ORAL_TABLET | ORAL | Status: DC

## 2015-02-09 MED ORDER — LEVOTHYROXINE SODIUM 88 MCG PO TABS: ORAL_TABLET | ORAL | Status: DC

## 2015-02-09 NOTE — Telephone Encounter (Signed)
Message sent to pt advising Rx sent to requested pharmacy. Pt informed OV with labs required for additional refills

## 2015-02-09 NOTE — Addendum Note (Signed)
Addended by: Modena Nunnery on: 02/09/2015 02:37 PM   Modules accepted: Orders

## 2015-02-16 ENCOUNTER — Other Ambulatory Visit: Payer: Self-pay | Admitting: Family Medicine

## 2015-03-09 ENCOUNTER — Other Ambulatory Visit: Payer: Self-pay | Admitting: Family Medicine

## 2015-03-09 DIAGNOSIS — E039 Hypothyroidism, unspecified: Secondary | ICD-10-CM

## 2015-03-13 ENCOUNTER — Ambulatory Visit (INDEPENDENT_AMBULATORY_CARE_PROVIDER_SITE_OTHER): Payer: Medicare Other | Admitting: Family Medicine

## 2015-03-13 ENCOUNTER — Other Ambulatory Visit (INDEPENDENT_AMBULATORY_CARE_PROVIDER_SITE_OTHER): Payer: Medicare Other

## 2015-03-13 ENCOUNTER — Encounter: Payer: Self-pay | Admitting: Family Medicine

## 2015-03-13 VITALS — BP 120/80 | HR 70 | Temp 98.5°F | Ht 67.0 in | Wt 118.2 lb

## 2015-03-13 DIAGNOSIS — E039 Hypothyroidism, unspecified: Secondary | ICD-10-CM

## 2015-03-13 DIAGNOSIS — L209 Atopic dermatitis, unspecified: Secondary | ICD-10-CM

## 2015-03-13 LAB — T4, FREE: Free T4: 1.24 ng/dL (ref 0.60–1.60)

## 2015-03-13 LAB — TSH: TSH: 0.14 u[IU]/mL — ABNORMAL LOW (ref 0.35–4.50)

## 2015-03-13 MED ORDER — TRIAMCINOLONE ACETONIDE 0.1 % EX CREA: 1.0000 "application " | TOPICAL_CREAM | Freq: Two times a day (BID) | CUTANEOUS | Status: DC

## 2015-03-13 MED ORDER — PREDNISONE 10 MG PO TABS: ORAL_TABLET | ORAL | Status: DC

## 2015-03-13 NOTE — Patient Instructions (Addendum)
Complete course of prednisone. Start zyrtec at bedtime. Plain. Can apply topical steroid cream as needed for flare. Stop all other creams other than Vaseline. Hold off on compresses for now.  Call if not improving as expected.

## 2015-03-13 NOTE — Progress Notes (Signed)
Pre visit review using our clinic review tool, if applicable. No additional management support is needed unless otherwise documented below in the visit note. 

## 2015-03-13 NOTE — Assessment & Plan Note (Signed)
Treat with  pred taper, anti=hitamine and topical steroid for flares. Keep skin hydrated with vaseline and avoid scrubbing with harsh soaps.

## 2015-03-13 NOTE — Progress Notes (Signed)
   Subjective:    Patient ID: Jessica Miles, female    DOB: June 22, 1945, 70 y.o.   MRN: [id number]  HPI 70 year old female presents with recurrent itchy, burning swollen red eye, on skin. No discharge. No conjunctiva involvement Tingling, slight warmth. No vision changes.  returned in last week, worsening.  Saw MD in [facility] when was there in [place] her not infeciton, nothing done because she was hesitant.  This is second episode in 3 months. Can go away but lasts 2 weeks at a time.  Eye doctor (Dr. Cephus Shelling) told her dry eye. Was on restasis.  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  Tobradex gel on eye lids, eladil stung.  Tried cortisone cream.  Tried benadryl at night. Not sure if helped because went to sleep.  Does not wear makeup or cream.  Has had similar flares in last 2 years. Tried antibiotics in past.  Use netty pot twice daily which keeps away sinus   Social History /Family History/Past Medical History reviewed and updated if needed.  Review of Systems  Constitutional: Negative for fever and fatigue.  HENT: Negative for ear pain.   Eyes: Negative for pain.  Respiratory: Negative for chest tightness and shortness of breath.   Cardiovascular: Negative for chest pain, palpitations and leg swelling.  Gastrointestinal: Negative for abdominal pain.  Genitourinary: Negative for dysuria.       Objective:   Physical Exam  Constitutional: Vital signs are normal. She appears well-developed and well-nourished. She is cooperative.  Non-toxic appearance. She does not appear ill. No distress.  HENT:  Head: Normocephalic.  Right Ear: Hearing, tympanic membrane, external ear and ear canal normal. Tympanic membrane is not erythematous, not retracted and not bulging.  Left Ear: Hearing, tympanic membrane, external ear and ear canal normal. Tympanic membrane is not erythematous, not retracted and not bulging.  Nose: No mucosal edema or rhinorrhea. Right sinus exhibits no maxillary sinus  tenderness and no frontal sinus tenderness. Left sinus exhibits no maxillary sinus tenderness and no frontal sinus tenderness.  Mouth/Throat: Uvula is midline, oropharynx is clear and moist and mucous membranes are normal.  Eyes: Conjunctivae, EOM and lids are normal. Pupils are equal, round, and reactive to light. Lids are everted and swept, no foreign bodies found.  erythema of skin around eyes and on eyelids. Vaseline applied.  Neck: Trachea normal and normal range of motion. Neck supple. Carotid bruit is not present. No thyroid mass and no thyromegaly present.  Cardiovascular: Normal rate, regular rhythm, S1 normal, S2 normal, normal heart sounds, intact distal pulses and normal pulses.  Exam reveals no gallop and no friction rub.   No murmur heard. Pulmonary/Chest: Effort normal and breath sounds normal. No tachypnea. No respiratory distress. She has no decreased breath sounds. She has no wheezes. She has no rhonchi. She has no rales.  Abdominal: Soft. Normal appearance and bowel sounds are normal. There is no tenderness.  Neurological: She is alert.  Skin: Skin is warm, dry and intact. No rash noted.  Psychiatric: Her speech is normal and behavior is normal. Judgment and thought content normal. Her mood appears not anxious. Cognition and memory are normal. She does not exhibit a depressed mood.          Assessment & Plan:

## 2015-03-17 ENCOUNTER — Other Ambulatory Visit: Payer: Medicare Other

## 2015-03-17 ENCOUNTER — Encounter: Payer: Self-pay | Admitting: Family Medicine

## 2015-03-18 NOTE — Telephone Encounter (Signed)
If you would like i can send it in for her, but she may need to contact her insurance company first since they have already sent her a 90d supply, and may not cover it.

## 2015-06-23 ENCOUNTER — Other Ambulatory Visit: Payer: Self-pay | Admitting: Family Medicine

## 2015-06-23 MED ORDER — LEVOTHYROXINE SODIUM 100 MCG PO TABS: 100.0000 ug | ORAL_TABLET | Freq: Every day | ORAL | Status: DC

## 2015-06-23 NOTE — Telephone Encounter (Signed)
You have to change this as it is a new Rx or if you document it below, it am able to do so

## 2015-06-23 NOTE — Telephone Encounter (Signed)
Pt left v/m requesting renewal of levothyroxine 100 mcg; pt has not been alternating from 100 mcg to 88 mcg. Pt [facility]. Pt request cb if she should continue taking the 100 mcg daily. Pt contact # [phone]. Last TSH 03/13/15.Please advise.

## 2015-08-17 ENCOUNTER — Ambulatory Visit: Payer: Medicare Other | Admitting: Podiatry

## 2015-08-20 ENCOUNTER — Ambulatory Visit: Payer: Medicare Other | Admitting: Family Medicine

## 2015-08-27 ENCOUNTER — Encounter: Payer: Self-pay | Admitting: Family Medicine

## 2015-08-27 ENCOUNTER — Ambulatory Visit (INDEPENDENT_AMBULATORY_CARE_PROVIDER_SITE_OTHER): Payer: Medicare Other | Admitting: Family Medicine

## 2015-08-27 VITALS — BP 98/62 | HR 88 | Temp 97.9°F | Ht 66.0 in | Wt 114.5 lb

## 2015-08-27 DIAGNOSIS — F32A Depression, unspecified: Secondary | ICD-10-CM

## 2015-08-27 DIAGNOSIS — M8580 Other specified disorders of bone density and structure, unspecified site: Secondary | ICD-10-CM

## 2015-08-27 DIAGNOSIS — E039 Hypothyroidism, unspecified: Secondary | ICD-10-CM | POA: Diagnosis not present

## 2015-08-27 DIAGNOSIS — F329 Major depressive disorder, single episode, unspecified: Secondary | ICD-10-CM

## 2015-08-27 DIAGNOSIS — K589 Irritable bowel syndrome without diarrhea: Secondary | ICD-10-CM

## 2015-08-27 DIAGNOSIS — Z0000 Encounter for general adult medical examination without abnormal findings: Secondary | ICD-10-CM

## 2015-08-27 LAB — CBC WITH DIFFERENTIAL/PLATELET
BASOS PCT: 0.8 % (ref 0.0–3.0)
Basophils Absolute: 0 10*3/uL (ref 0.0–0.1)
EOS PCT: 0.8 % (ref 0.0–5.0)
Eosinophils Absolute: 0 10*3/uL (ref 0.0–0.7)
HEMATOCRIT: 39.5 % (ref 36.0–46.0)
HEMOGLOBIN: 13.4 g/dL (ref 12.0–15.0)
Lymphocytes Relative: 36.3 % (ref 12.0–46.0)
Lymphs Abs: 1.8 10*3/uL (ref 0.7–4.0)
MCHC: 33.9 g/dL (ref 30.0–36.0)
MCV: 89.5 fl (ref 78.0–100.0)
MONOS PCT: 7.8 % (ref 3.0–12.0)
Monocytes Absolute: 0.4 10*3/uL (ref 0.1–1.0)
Neutro Abs: 2.7 10*3/uL (ref 1.4–7.7)
Neutrophils Relative %: 54.3 % (ref 43.0–77.0)
Platelets: 218 10*3/uL (ref 150.0–400.0)
RBC: 4.42 Mil/uL (ref 3.87–5.11)
RDW: 13 % (ref 11.5–15.5)
WBC: 5 10*3/uL (ref 4.0–10.5)

## 2015-08-27 LAB — TSH: TSH: 0.26 u[IU]/mL — AB (ref 0.35–4.50)

## 2015-08-27 LAB — COMPREHENSIVE METABOLIC PANEL
ALBUMIN: 4.5 g/dL (ref 3.5–5.2)
ALK PHOS: 64 U/L (ref 39–117)
ALT: 20 U/L (ref 0–35)
AST: 27 U/L (ref 0–37)
BUN: 25 mg/dL — AB (ref 6–23)
CALCIUM: 9.4 mg/dL (ref 8.4–10.5)
CO2: 30 mEq/L (ref 19–32)
Chloride: 101 mEq/L (ref 96–112)
Creatinine, Ser: 0.99 mg/dL (ref 0.40–1.20)
GFR: 58.67 mL/min — AB (ref >60.00)
Glucose, Bld: 88 mg/dL (ref 70–99)
POTASSIUM: 4.1 meq/L (ref 3.5–5.1)
Sodium: 138 mEq/L (ref 135–145)
TOTAL PROTEIN: 7 g/dL (ref 6.0–8.3)
Total Bilirubin: 0.6 mg/dL (ref 0.2–1.2)

## 2015-08-27 LAB — LIPID PANEL
CHOLESTEROL: 180 mg/dL (ref 0–200)
HDL: 81.2 mg/dL (ref >39.00)
LDL Cholesterol: 90 mg/dL (ref 0–99)
NonHDL: 98.72
TRIGLYCERIDES: 46 mg/dL (ref 0.0–149.0)
Total CHOL/HDL Ratio: 2
VLDL: 9.2 mg/dL (ref 0.0–40.0)

## 2015-08-27 LAB — VITAMIN D 25 HYDROXY (VIT D DEFICIENCY, FRACTURES): VITD: 59.57 ng/mL (ref 30.00–100.00)

## 2015-08-27 LAB — VITAMIN B12: Vitamin B-12: 875 pg/mL (ref 211–911)

## 2015-08-27 LAB — T4, FREE: Free T4: 1.1 ng/dL (ref 0.60–1.60)

## 2015-08-27 NOTE — Assessment & Plan Note (Signed)
Clinically euthyroid 

## 2015-08-27 NOTE — Assessment & Plan Note (Signed)
Refer to nutritionist 

## 2015-08-27 NOTE — Progress Notes (Signed)
Pre visit review using our clinic review tool, if applicable. No additional management support is needed unless otherwise documented below in the visit note. 

## 2015-08-27 NOTE — Assessment & Plan Note (Addendum)
The patients weight, height, BMI and visual acuity have been recorded in the chart.  Cognitive function assessed.   I have made referrals, counseling and provided education to the patient based review of the above and I have provided the pt with a written personalized care plan for preventive services.  Labs today. 

## 2015-08-27 NOTE — Patient Instructions (Signed)
Great to see you. I will call you with you lab results.   We will call you about a nutritionist appointment.

## 2015-08-27 NOTE — Progress Notes (Signed)
Subjective:   Patient ID: Mckaylin Goliday, female    DOB: [birth year], 70 y.o.   MRN: [id number]  Zaharia Bunde is a pleasant 70 y.o. year old female who presents to clinic today with Annual Exam and Irritable Bowel Syndrome  on 08/27/2015  HPI:  I have personally reviewed the Medicare Annual Wellness questionnaire and have noted 1. The patient's medical and social history 2. Their use of alcohol, tobacco or illicit drugs 3. Their current medications and supplements 4. The patient's functional ability including ADL's, fall risks, home safety risks and hearing or visual             impairment. 5. Diet and physical activities 6. Evidence for depression or mood disorders  End of life wishes discussed and updated in Social History.  The roster of all physicians providing medical care to patient - is listed in the CareTeams section of the chart.  Zostavax 01/11/08 Colonoscopy 03/21/14 Mammogram 11/26/14 Pneumovax 01/11/07 prevnar 13- 10/10/13 Td 01/11/08  Hypothyroidism-  Alternating between synthroid 100 mcg and 88 mcg daily. Denies any symptoms of hypo or hyperthyroidism.  Was managed by endo and TSH has been chronically low while remaining clinically euthyroid. Lab Results  Component Value Date   TSH 0.14 (L) 03/13/2015   Lab Results  Component Value Date   CHOL 184 03/18/2014   HDL 83.30 03/18/2014   LDLCALC 91 03/18/2014   TRIG 49.0 03/18/2014   CHOLHDL 2 03/18/2014    IBS- overall doing well.  Followed by GI. Following a restricted diet.  Wants to see a nutritionist to help with this.  Current Outpatient Prescriptions on File Prior to Visit  Medication Sig Dispense Refill  . ascorbic acid (VITAMIN C) 500 MG tablet Take 500 mg by mouth 2 (two) times daily.     Marland Kitchen atenolol (TENORMIN) 25 MG tablet Take 25 mg by mouth daily as needed.     . Calcium Carb-Cholecalciferol (CALCIUM-VITAMIN D) 600-400 MG-UNIT TABS Take 1 tablet by mouth 2 (two) times daily.    Marland Kitchen levothyroxine (SYNTHROID,  LEVOTHROID) 100 MCG tablet Take 1 tablet (100 mcg total) by mouth daily. 90 tablet 3  . Olopatadine HCl (PAZEO) 0.7 % SOLN Apply 1 drop to eye daily.    Marland Kitchen triamcinolone cream (KENALOG) 0.1 % Apply 1 application topically 2 (two) times daily. 30 g 0   No current facility-administered medications on file prior to visit.     Allergies  Allergen Reactions  . Allegra [Fexofenadine Hcl]     Makes heart race   . Fluticasone Other (See Comments)    Nose bleeds  . Mometasone Other (See Comments)    Epistaxis  . Fexofenadine Palpitations    Past Medical History:  Diagnosis Date  . Anxiety   . History of IBS   . HPV (human papilloma virus) infection   . Loss of taste   . Neuropathy ([place]) 2015   surgery for Mortons neuroma in 1985  . Osteopenia   . Tachycardia   . Tachycardia   . Thyroid disease     Past Surgical History:  Procedure Laterality Date  . [place]  . WISDOM TOOTH EXTRACTION      Family History  Problem Relation Age of Onset  . Heart failure Mother   . Heart attack Mother   . Heart attack Father   . Heart attack Maternal Grandfather   . Thyroid disease Neg Hx     Social History   Social History  .  Marital status: Married    Spouse name: Mercie Eon. Corlett  . Number of children: 2  . Years of education: MA   Occupational History  . Retired    Social History Main Topics  . Smoking status: Never Smoker  . Smokeless tobacco: Never Used  . Alcohol use Yes     Comment: 2-3 glasses per week  . Drug use: No  . Sexual activity: Yes   Other Topics Concern  . Not on file   Social History Narrative   Patient lives at [place] with spouse.   Caffeine Use: 1 cup daily   2 kids- 4 grandchildren- two boys and two girls         The PMH, PSH, Social History, Family History, Medications, and allergies have been reviewed in [facility], and have been updated if relevant.   Review of Systems  Constitutional: Negative.   HENT: Negative.  Negative  for sneezing, sore throat, trouble swallowing and voice change.   Respiratory: Negative.   Cardiovascular: Negative.   Gastrointestinal: Positive for diarrhea. Negative for abdominal distention, abdominal pain, anal bleeding, blood in stool, constipation, nausea, rectal pain and vomiting.  Endocrine: Negative.   Genitourinary: Negative.   Musculoskeletal: Negative.   Skin: Negative.   Allergic/Immunologic: Negative.   Neurological: Negative.   Hematological: Negative.   Psychiatric/Behavioral: Negative.   All other systems reviewed and are negative.      Objective:    BP 98/62   Pulse 88   Temp 97.9 F (36.6 C) (Oral)   Ht 5\' 6"  (1.676 m)   Wt 114 lb 8 oz (51.9 kg)   SpO2 97%   BMI 18.48 kg/m   Wt Readings from Last 3 Encounters:  08/27/15 114 lb 8 oz (51.9 kg)  03/13/15 118 lb 4 oz (53.6 kg)  07/12/14 117 lb (53.1 kg)     Physical Exam  Constitutional: She is oriented to person, place, and time. She appears well-developed and well-nourished. No distress.  HENT:  Head: Normocephalic.  Eyes: Conjunctivae are normal.  Neck: Normal range of motion.  Cardiovascular: Normal rate.   Pulmonary/Chest: Effort normal.  Musculoskeletal: Normal range of motion. She exhibits no edema.  Neurological: She is alert and oriented to person, place, and time. No cranial nerve deficit.  Skin: Skin is warm and dry.  Psychiatric: She has a normal mood and affect. Her behavior is normal. Judgment and thought content normal.  Nursing note and vitals reviewed.         Assessment & Plan:   Depression  Osteopenia  Hypothyroidism, unspecified hypothyroidism type  Medicare annual wellness visit, subsequent No Follow-up on file.

## 2015-08-31 ENCOUNTER — Encounter: Payer: Self-pay | Admitting: Family Medicine

## 2015-09-04 ENCOUNTER — Encounter: Payer: Self-pay | Admitting: Family Medicine

## 2015-09-05 ENCOUNTER — Encounter: Payer: Self-pay | Admitting: Family Medicine

## 2015-09-05 DIAGNOSIS — Z131 Encounter for screening for diabetes mellitus: Secondary | ICD-10-CM

## 2015-09-05 DIAGNOSIS — Z1159 Encounter for screening for other viral diseases: Secondary | ICD-10-CM

## 2015-09-05 DIAGNOSIS — K589 Irritable bowel syndrome without diarrhea: Secondary | ICD-10-CM

## 2015-09-07 ENCOUNTER — Other Ambulatory Visit: Payer: Self-pay | Admitting: Family Medicine

## 2015-09-08 NOTE — Addendum Note (Signed)
Addended by: Lucille Passy on: 09/08/2015 09:22 AM   Modules accepted: Orders

## 2015-09-10 ENCOUNTER — Other Ambulatory Visit (INDEPENDENT_AMBULATORY_CARE_PROVIDER_SITE_OTHER): Payer: Medicare Other

## 2015-09-10 DIAGNOSIS — K589 Irritable bowel syndrome without diarrhea: Secondary | ICD-10-CM

## 2015-09-10 DIAGNOSIS — Z131 Encounter for screening for diabetes mellitus: Secondary | ICD-10-CM | POA: Diagnosis not present

## 2015-09-10 DIAGNOSIS — Z1159 Encounter for screening for other viral diseases: Secondary | ICD-10-CM

## 2015-09-10 LAB — HEMOGLOBIN A1C: Hgb A1c MFr Bld: 5.6 % (ref 4.6–6.5)

## 2015-09-11 LAB — CELIAC PANEL 10
ENDOMYSIAL SCREEN: NEGATIVE
GLIADIN IGA: 4 U (ref <20)
Gliadin IgG: 4 Units (ref <20)
IGA: 120 mg/dL (ref 81–463)
TISSUE TRANSGLUT AB: 1 U/mL (ref <6)
TISSUE TRANSGLUTAMINASE AB, IGA: 1 U/mL (ref <4)

## 2015-09-11 LAB — HEPATITIS C ANTIBODY: HCV Ab: NEGATIVE

## 2015-09-18 ENCOUNTER — Telehealth: Payer: Self-pay | Admitting: Family Medicine

## 2015-09-18 NOTE — Telephone Encounter (Signed)
Pt called checking on labs 8/31.  Pt stated she is not seeing anything on my chart

## 2015-09-21 ENCOUNTER — Ambulatory Visit (INDEPENDENT_AMBULATORY_CARE_PROVIDER_SITE_OTHER): Payer: Medicare Other | Admitting: Family Medicine

## 2015-09-21 ENCOUNTER — Encounter: Payer: Self-pay | Admitting: Family Medicine

## 2015-09-21 VITALS — BP 112/68 | HR 72 | Temp 97.9°F | Wt 113.0 lb

## 2015-09-21 DIAGNOSIS — M25521 Pain in right elbow: Secondary | ICD-10-CM

## 2015-09-21 DIAGNOSIS — H04123 Dry eye syndrome of bilateral lacrimal glands: Secondary | ICD-10-CM | POA: Insufficient documentation

## 2015-09-21 NOTE — Telephone Encounter (Signed)
Labs printed to be given to pt at 9/11 appt

## 2015-09-21 NOTE — Progress Notes (Signed)
Patient ID: Jessica Miles, female   DOB: [birth year], 70 y.o.   MRN: [id number] SUBJECTIVE: Cloee Miles is a 70 y.o. female who sustained a right elbow injury 2 month(s) ago. Mechanism of injury: weight lifting. Immediate symptoms: delayed pain. Symptoms have been intermittent since that time. Prior history of related problems: no prior problems with this area in the past.  Current Outpatient Prescriptions on File Prior to Visit  Medication Sig Dispense Refill  . ascorbic acid (VITAMIN C) 500 MG tablet Take 500 mg by mouth 2 (two) times daily.     Marland Kitchen atenolol (TENORMIN) 25 MG tablet Take 25 mg by mouth daily as needed.     . Calcium Carb-Cholecalciferol (CALCIUM-VITAMIN D) 600-400 MG-UNIT TABS Take 1 tablet by mouth 2 (two) times daily.    Marland Kitchen levothyroxine (SYNTHROID, LEVOTHROID) 100 MCG tablet Take 1 tablet (100 mcg total) by mouth daily. 90 tablet 3  . Olopatadine HCl (PAZEO) 0.7 % SOLN Apply 1 drop to eye daily.    Marland Kitchen triamcinolone cream (KENALOG) 0.1 % Apply 1 application topically 2 (two) times daily. 30 g 0   No current facility-administered medications on file prior to visit.     Allergies  Allergen Reactions  . Allegra [Fexofenadine Hcl]     Makes heart race   . Fluticasone Other (See Comments)    Nose bleeds  . Mometasone Other (See Comments)    Epistaxis  . Fexofenadine Palpitations    Past Medical History:  Diagnosis Date  . Anxiety   . History of IBS   . HPV (human papilloma virus) infection   . Loss of taste   . Neuropathy ([place]) 2015   surgery for Mortons neuroma in 1985  . Osteopenia   . Tachycardia   . Tachycardia   . Thyroid disease     Past Surgical History:  Procedure Laterality Date  . [place]  . WISDOM TOOTH EXTRACTION      Family History  Problem Relation Age of Onset  . Heart failure Mother   . Heart attack Mother   . Heart attack Father   . Heart attack Maternal Grandfather   . Thyroid disease Neg Hx     Social History    Social History  . Marital status: Married    Spouse name: Jessica Eon. Miles  . Number of children: 2  . Years of education: MA   Occupational History  . Retired    Social History Main Topics  . Smoking status: Never Smoker  . Smokeless tobacco: Never Used  . Alcohol use Yes     Comment: 2-3 glasses per week  . Drug use: No  . Sexual activity: Yes   Other Topics Concern  . Not on file   Social History Narrative   Patient lives at [place] with spouse.   Caffeine Use: 1 cup daily   2 kids- 4 grandchildren- two boys and two girls         The PMH, PSH, Social History, Family History, Medications, and allergies have been reviewed in [facility], and have been updated if relevant.  OBJECTIVE: BP 112/68   Pulse 72   Temp 97.9 F (36.6 C) (Oral)   Wt 113 lb (51.3 kg)   SpO2 98%   BMI 18.24 kg/m   Vital signs as noted above. Appearance: alert, well appearing, and in no distress and oriented to person, place, and time. Elbow exam: lateral epicondylar tenderness. X-ray: not indicated.  ASSESSMENT: elbow tendon injury  PLAN: rest the injured area as much as practical Discussed exercises See orders for this visit as documented in the electronic medical record.

## 2015-09-21 NOTE — Progress Notes (Signed)
Pre visit review using our clinic review tool, if applicable. No additional management support is needed unless otherwise documented below in the visit note. 

## 2015-09-21 NOTE — Progress Notes (Signed)
Pt also mentions persistent dry eyes which she see optho for.  Will get sorgrens panel as well.

## 2015-09-22 LAB — SJOGREN'S SYNDROME ANTIBODS(SSA + SSB)
SSA (Ro) (ENA) Antibody, IgG: <1
SSB (LA) (ENA) ANTIBODY, IGG: NEGATIVE

## 2015-09-28 ENCOUNTER — Ambulatory Visit: Payer: Medicare Other | Admitting: Podiatry

## 2015-10-04 ENCOUNTER — Encounter: Payer: Self-pay | Admitting: Family Medicine

## 2015-10-05 ENCOUNTER — Other Ambulatory Visit: Payer: Self-pay | Admitting: Family Medicine

## 2015-10-05 MED ORDER — BUPROPION HCL ER (SR) 100 MG PO TB12: 100.0000 mg | ORAL_TABLET | Freq: Two times a day (BID) | ORAL | 3 refills | Status: DC

## 2015-10-20 DIAGNOSIS — M79673 Pain in unspecified foot: Secondary | ICD-10-CM

## 2015-10-21 DIAGNOSIS — M25529 Pain in unspecified elbow: Secondary | ICD-10-CM | POA: Insufficient documentation

## 2015-11-11 ENCOUNTER — Encounter: Payer: Self-pay | Admitting: Diagnostic Neuroimaging

## 2015-11-11 ENCOUNTER — Ambulatory Visit (INDEPENDENT_AMBULATORY_CARE_PROVIDER_SITE_OTHER): Payer: Medicare Other | Admitting: Diagnostic Neuroimaging

## 2015-11-11 VITALS — BP 120/66 | HR 69 | Ht 66.0 in | Wt 116.6 lb

## 2015-11-11 DIAGNOSIS — R253 Fasciculation: Secondary | ICD-10-CM

## 2015-11-11 DIAGNOSIS — R200 Anesthesia of skin: Secondary | ICD-10-CM

## 2015-11-11 DIAGNOSIS — R251 Tremor, unspecified: Secondary | ICD-10-CM

## 2015-11-11 NOTE — Progress Notes (Signed)
[facility]  PATIENT: Jessica Miles DOB: [birth year]  REFERRING CLINICIAN:  HISTORY FROM: patient  REASON FOR VISIT: follow up   HISTORICAL  CHIEF COMPLAINT:  Chief Complaint  Patient presents with  . Numbness    rm 7, "numbness in feet getting worse; pain in right elbow"  . Follow-up    HISTORY OF PRESENT ILLNESS:   UPDATE 11/11/15: Since last visit, in last 3 months, more symptoms are starting. Now with muscle weakness in wrist, ankles, intermittent muscle twitching. More burning sensation in tongue and tingling in face. More burning in feet. Had event where she stood up and could move her legs. Had intermittent right foot/ankle inversion. Intermittent toes cramping.  UPDATE 01/20/14: Since last visit, foot numbness has slightly improved again. Taste loss is stable. Going to see [facility] smell/taste clinic in a few months. IBS symptoms stable.   UPDATE 10/16/13: Since last visit, numbness in feet slightly improved; continues on ALA, benfotamine, B12 supplements. Also has new problem, consistenting of reduced taste perception. Especially diff with sweet and sour. Smell is slightly reduced. She noticed this 3 weeks ago.   PRIOR HPI (08/20/13): 70 year old right-handed female with anxiety, tachycardia, hypothyroidism, IBS, here for evaluation of neuropathy. 5 months ago patient had significant increase in physical activity with increased exercise on a daily basis. End of June 2015 patient also started playing tennis. Within a few weeks patient developed numbness in her toes and feet. She felt a "pad sensation" on the balls of her feet. Patient also was developing some cramps in her calves. Patient went to PCP and podiatry, treated with metatarsal pads, cortisone shots in the feet, started on vitamin supplementation. Over the past few weeks patient symptoms have slightly improved. She still has some numbness on the bottom of her feet as well as some intermittent cramps. Patient is  also to try to reduce her physical activity. Patient had blood testing by PCP with normal B12 level, and slightly low TSH. 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 Patient denies any neck or low back pain. No problems with her fingers or hands. No vision changes, slurred speech trouble talking headaches.   REVIEW OF SYSTEMS: Full 14 system review of systems performed and notable only for numbness.  ALLERGIES: Allergies  Allergen Reactions  . Allegra [Fexofenadine Hcl]     Makes heart race   . Fluticasone Other (See Comments)    Nose bleeds  . Mometasone Other (See Comments)    Epistaxis  . Fexofenadine Palpitations    HOME MEDICATIONS: Outpatient Medications Prior to Visit  Medication Sig Dispense Refill  . ascorbic acid (VITAMIN C) 500 MG tablet Take 500 mg by mouth 2 (two) times daily.     Marland Kitchen atenolol (TENORMIN) 25 MG tablet Take 25 mg by mouth daily as needed.     Marland Kitchen buPROPion (WELLBUTRIN SR) 100 MG 12 hr tablet Take 1 tablet (100 mg total) by mouth 2 (two) times daily. 180 tablet 3  . Calcium Carb-Cholecalciferol (CALCIUM-VITAMIN D) 600-400 MG-UNIT TABS Take 1 tablet by mouth 2 (two) times daily.    Marland Kitchen levothyroxine (SYNTHROID, LEVOTHROID) 100 MCG tablet Take 1 tablet (100 mcg total) by mouth daily. 90 tablet 3  . triamcinolone cream (KENALOG) 0.1 % Apply 1 application topically 2 (two) times daily. 30 g 0  . Olopatadine HCl (PAZEO) 0.7 % SOLN Apply 1 drop to eye daily.     No facility-administered medications prior to visit.  PAST MEDICAL HISTORY: Past Medical History:  Diagnosis Date  . Anxiety   . History of IBS   . HPV (human papilloma virus) infection   . Loss of taste   . Neuropathy ([place]) 2015   surgery for Mortons neuroma in 1985  . Osteopenia   . Tachycardia   . Tachycardia   . Thyroid disease     PAST SURGICAL HISTORY: Past Surgical History:  Procedure Laterality Date  . [place]    . WISDOM TOOTH EXTRACTION      FAMILY HISTORY: Family History  Problem Relation Age of Onset  . Heart failure Mother   . Heart attack Mother   . Heart attack Father   . Heart attack Maternal Grandfather   . Thyroid disease Neg Hx     SOCIAL HISTORY:  Social History   Social History  . Marital status: Married    Spouse name: Mercie Eon. Arnott  . Number of children: 2  . Years of education: MA   Occupational History  . Retired    Social History Main Topics  . Smoking status: Never Smoker  . Smokeless tobacco: Never Used  . Alcohol use Yes     Comment: 2-3 glasses per week  . Drug use: No  . Sexual activity: Yes   Other Topics Concern  . Not on file   Social History Narrative   Patient lives at [place] with spouse.   Caffeine Use: 1 cup daily   2 kids- 4 grandchildren- two boys and two girls           PHYSICAL EXAM  Vitals:   11/11/15 1119  BP: 120/66  Pulse: 69  Weight: 116 lb 9.6 oz (52.9 kg)  Height: _0  (1.676 m)    Not recorded      Body mass index is 18.82 kg/m.  GENERAL EXAM: Patient is in no distress; well developed, nourished and groomed; neck is supple  CARDIOVASCULAR: Regular rate and rhythm, no murmurs, no carotid bruits  NEUROLOGIC: MENTAL STATUS: awake, alert, language fluent, comprehension intact, naming intact, fund of knowledge appropriate CRANIAL NERVE: pupils equal and reactive to light, visual fields full to confrontation, extraocular muscles intact, no nystagmus, facial sensation and strength symmetric, hearing intact, palate elevates symmetrically, uvula midline, shoulder shrug symmetric, tongue midline. MOTOR: normal bulk and tone, full strength in the BUE, BLE; NO TREMOR, RIGIDITY OR BRADYKINESIA SENSORY: normal and symmetric to light touch, temp and vibration; DECR PP IN BLE FEET; DECR IN RIGHT FOOT MORE THAN LEFT FOOT COORDINATION: finger-nose-finger, fine finger movements normal REFLEXES: deep tendon reflexes ARE  SLIGHTLY BRISK IN ARMS AND KNEES and symmetric; TRACE AT ANKLES GAIT/STATION: narrow based gait; able to walk on toes/heels; romberg is negative    DIAGNOSTIC DATA (LABS, IMAGING, TESTING) - I reviewed patient records, labs, notes, testing and imaging myself where available.  Lab Results  Component Value Date   WBC 5.0 08/27/2015      Component Value Date/Time   NA 138 08/27/2015 1126   Lab Results  Component Value Date   CHOL 180 08/27/2015   Lab Results  Component Value Date   HGBA1C 5.6 09/10/2015   Lab Results  Component Value Date   EGBTDVVO16 073 08/27/2015   Lab Results  Component Value Date   TSH 0.26 (L) 08/27/2015    B12 > 1999 TSH 0.144 ANA, SPEP (IFE), CRP, ESR - normal  10/29/13 MRI brain [I reviewed images myself and agree with interpretation. -VRP]  -  Normal MRI brain (with and without).    ASSESSMENT AND PLAN  70 y.o. year old female here with numbness and tingling in toes, feet, since June 2015. Exam notable for decr PP, temp in distal feet / toes and trace reflexes at ankles. Now with decreased taste sensation since Sept/Oct 2015, which could also be neuropathy related.  Ddx: neuropathy (small fiber, large fiber, metabolic, autoimmune, inflamm, compressive, physical overuse, idiopathic) vs myopathy vs CNS autoimmune/inflamm/degenerative  Numbness in feet  Muscle twitching  Tremor  Twitching    PLAN: I spent 40 minutes of face to face time with patient. Greater than 50% of time was spent in counseling and coordination of care with patient. In summary we discussed:   - check MRI brain and cervical spine to rule out CNS autoimmune/inflamm/degenerative conditions  - check EMG/NCS to evaluate for peripheral neuropathy vs myopathy  - consider slightly reducing levothyroxine (numbers are borderline; overtreatment of hypothyroidism could lead to muscle twitching and tremors)  Orders Placed This Encounter  Procedures  . MR BRAIN W WO  CONTRAST  . MR CERVICAL SPINE WO CONTRAST  . CK  . Aldolase  . TSH  . T4, Free  . NCV with EMG(electromyography)   Return for for NCV/EMG.    Penni Bombard, MD 71/08/2097, 06:89 AM Certified in Neurology, Neurophysiology and Neuroimaging  [facility] [address] [place], [place] [address] [phone]

## 2015-11-12 LAB — TSH: TSH: 1.18 u[IU]/mL (ref 0.450–4.500)

## 2015-11-12 LAB — CK: Total CK: 126 U/L (ref 24–173)

## 2015-11-12 LAB — T4, FREE: Free T4: 1.44 ng/dL (ref 0.82–1.77)

## 2015-11-12 LAB — ALDOLASE: Aldolase: 3.2 U/L — ABNORMAL LOW (ref 3.3–10.3)

## 2015-11-13 ENCOUNTER — Telehealth: Payer: Self-pay | Admitting: *Deleted

## 2015-11-13 NOTE — Telephone Encounter (Signed)
LVM informing patient that her lab results are unremarkable. Advised she will get a call when MRI and EMG results are available; gave dates of those upcoming tests. Advised her the office is now closed, opens Monday at 8 am. Left name and number.

## 2015-11-16 ENCOUNTER — Ambulatory Visit (INDEPENDENT_AMBULATORY_CARE_PROVIDER_SITE_OTHER): Payer: Medicare Other

## 2015-11-16 ENCOUNTER — Encounter: Payer: Self-pay | Admitting: Podiatry

## 2015-11-16 ENCOUNTER — Ambulatory Visit (INDEPENDENT_AMBULATORY_CARE_PROVIDER_SITE_OTHER): Payer: Medicare Other | Admitting: Podiatry

## 2015-11-16 DIAGNOSIS — M79672 Pain in left foot: Secondary | ICD-10-CM

## 2015-11-16 DIAGNOSIS — Q828 Other specified congenital malformations of skin: Secondary | ICD-10-CM

## 2015-11-16 NOTE — Progress Notes (Signed)
She presents today with a chief complaint of worsening neuropathy with a banded feeling around her ankles right greater than left. She is also complaining of a painful lesion sub-fifth metatarsal base of the right foot.  Objective: Vital signs stable alert and oriented 3. Pulses are palpable. No open lesions or wounds. Solitary porokeratotic lesion sub-fifth met base right foot with palpation. An painful on attempted debridement. Ankle joint appears to be normal without crepitation and within normal limits. Radiographs demonstrate no major osseous abnormalities.  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  Plan: Chemical and mechanical destruction of the lesion today. This is performed after local anesthesia was initiated. I placed padding with salicylic acid under occlusion or follow-up with her in 6 weeks. I instructed her to leave the dressing on for 3 days and then wash off thoroughly.

## 2015-11-25 ENCOUNTER — Ambulatory Visit
Admission: RE | Admit: 2015-11-25 | Discharge: 2015-11-25 | Disposition: A | Discharge status: Home / Self Care | Payer: Medicare Other | Source: Ambulatory Visit | Attending: Diagnostic Neuroimaging | Admitting: Diagnostic Neuroimaging

## 2015-11-25 DIAGNOSIS — R253 Fasciculation: Secondary | ICD-10-CM

## 2015-11-25 DIAGNOSIS — R251 Tremor, unspecified: Secondary | ICD-10-CM

## 2015-11-25 DIAGNOSIS — R200 Anesthesia of skin: Secondary | ICD-10-CM

## 2015-11-25 MED ORDER — GADOBENATE DIMEGLUMINE 529 MG/ML IV SOLN
10.0000 mL | Freq: Once | INTRAVENOUS | Status: AC | PRN
  Administered 2015-11-25: 10 mL via INTRAVENOUS

## 2015-11-30 ENCOUNTER — Telehealth: Payer: Self-pay | Admitting: *Deleted

## 2015-11-30 NOTE — Telephone Encounter (Signed)
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 Dr Leta Baptist will continue with his current plan.  Her NCS/EMG are pending, scheduled for 12/24/15. She verbalized understanding, appreciation of call.

## 2015-11-30 NOTE — Telephone Encounter (Signed)
Per Dr Leta Baptist, spoke with patient and informed her that her MRI brain was unremarkable.  No major findings. No change from prior MRI 10/2013. She verbalized understanding, appreciation.

## 2015-12-17 ENCOUNTER — Encounter: Payer: Self-pay | Admitting: Family Medicine

## 2015-12-24 ENCOUNTER — Encounter: Payer: Medicare Other | Admitting: Diagnostic Neuroimaging

## 2015-12-25 NOTE — Telephone Encounter (Signed)
Debbie with [facility] at [place] left v/m requesting bone density order faxed to [phone]. Phone # if needed [phone].

## 2015-12-28 ENCOUNTER — Encounter: Payer: Self-pay | Admitting: Family Medicine

## 2015-12-28 ENCOUNTER — Other Ambulatory Visit: Payer: Self-pay | Admitting: Family Medicine

## 2015-12-28 DIAGNOSIS — E2839 Other primary ovarian failure: Secondary | ICD-10-CM

## 2016-01-18 ENCOUNTER — Ambulatory Visit: Payer: Medicare Other | Admitting: Podiatry

## 2016-04-21 ENCOUNTER — Encounter: Payer: Self-pay | Admitting: Family Medicine

## 2016-04-21 MED ORDER — LEVOTHYROXINE SODIUM 100 MCG PO TABS: 100.0000 ug | ORAL_TABLET | Freq: Every day | ORAL | 1 refills | Status: DC

## 2016-07-12 ENCOUNTER — Ambulatory Visit: Payer: Medicare Other | Admitting: Family Medicine

## 2016-07-14 ENCOUNTER — Ambulatory Visit (INDEPENDENT_AMBULATORY_CARE_PROVIDER_SITE_OTHER): Payer: Medicare Other | Admitting: Family Medicine

## 2016-07-14 VITALS — BP 100/68 | HR 78 | Temp 98.1°F | Wt 118.0 lb

## 2016-07-14 DIAGNOSIS — R300 Dysuria: Secondary | ICD-10-CM

## 2016-07-14 LAB — POC URINALSYSI DIPSTICK (AUTOMATED)
Bilirubin, UA: NEGATIVE
Blood, UA: NEGATIVE
GLUCOSE UA: NEGATIVE
Ketones, UA: NEGATIVE
LEUKOCYTES UA: NEGATIVE
NITRITE UA: NEGATIVE
PROTEIN UA: NEGATIVE
Spec Grav, UA: 1.02 (ref 1.010–1.025)
UROBILINOGEN UA: 0.2 U/dL
pH, UA: 7 (ref 5.0–8.0)

## 2016-07-14 NOTE — Progress Notes (Signed)
Pre visit review using our clinic review tool, if applicable. No additional management support is needed unless otherwise documented below in the visit note. 

## 2016-07-14 NOTE — Progress Notes (Signed)
SUBJECTIVE: Jessica Miles is a 71 y.o. female who complains of urinary frequency, urgency and dysuria x 14 days, without flank pain, fever, chills, or abnormal vaginal discharge or bleeding.   Current Outpatient Prescriptions on File Prior to Visit  Medication Sig Dispense Refill  . Alpha-Lipoic Acid (LIPOIC ACID PO) Take 100 mg by mouth 2 (two) times daily.    Marland Kitchen ascorbic acid (VITAMIN C) 500 MG tablet Take 500 mg by mouth 2 (two) times daily.     Marland Kitchen atenolol (TENORMIN) 25 MG tablet Take 25 mg by mouth daily as needed.     Marland Kitchen buPROPion (WELLBUTRIN SR) 100 MG 12 hr tablet Take 1 tablet (100 mg total) by mouth 2 (two) times daily. 180 tablet 3  . Calcium Carb-Cholecalciferol (CALCIUM-VITAMIN D) 600-400 MG-UNIT TABS Take 1 tablet by mouth 2 (two) times daily.    . cyanocobalamin 2000 MCG tablet Take 2,000 mcg by mouth daily.    Marland Kitchen levothyroxine (SYNTHROID, LEVOTHROID) 100 MCG tablet Take 1 tablet (100 mcg total) by mouth daily. 90 tablet 1  . Specialty Vitamins Products (MAGNESIUM, AMINO ACID CHELATE,) 133 MG tablet Take 1 tablet by mouth 2 (two) times daily. 500 mg daily    . triamcinolone cream (KENALOG) 0.1 % Apply 1 application topically 2 (two) times daily. 30 g 0  . UNABLE TO FIND Med Name: Benfotiame 300 mg two x daily     No current facility-administered medications on file prior to visit.     Allergies  Allergen Reactions  . Allegra [Fexofenadine Hcl]     Makes heart race   . Fluticasone Other (See Comments)    Nose bleeds  . Mometasone Other (See Comments)    Epistaxis  . Fexofenadine Palpitations    Past Medical History:  Diagnosis Date  . Anxiety   . History of IBS   . HPV (human papilloma virus) infection   . Loss of taste   . Neuropathy ([place]) 2015   surgery for Mortons neuroma in 1985  . Osteopenia   . Tachycardia   . Tachycardia   . Thyroid disease     Past Surgical History:  Procedure Laterality Date  . [place]  . WISDOM TOOTH EXTRACTION       Family History  Problem Relation Age of Onset  . Heart failure Mother   . Heart attack Mother   . Heart attack Father   . Heart attack Maternal Grandfather   . Thyroid disease Neg Hx     Social History   Social History  . Marital status: Married    Spouse name: Mercie Eon. Wyka  . Number of children: 2  . Years of education: MA   Occupational History  . Retired    Social History Main Topics  . Smoking status: Never Smoker  . Smokeless tobacco: Never Used  . Alcohol use Yes     Comment: 2-3 glasses per week  . Drug use: No  . Sexual activity: Yes   Other Topics Concern  . Not on file   Social History Narrative   Patient lives at [place] with spouse.   Caffeine Use: 1 cup daily   2 kids- 4 grandchildren- two boys and two girls         The PMH, PSH, Social History, Family History, Medications, and allergies have been reviewed in [facility], and have been updated if relevant.  OBJECTIVE:  BP 100/68   Pulse 78   Temp 98.1 F (36.7 C)  Wt 118 lb (53.5 kg)   SpO2 100%   BMI 19.05 kg/m   Appears well, in no apparent distress.  Vital signs are normal. The abdomen is soft without tenderness, guarding, mass, rebound or organomegaly. No CVA tenderness or inguinal adenopathy noted. Urine dipstick shows negative for all components.  ASSESSMENT: bladder irritation- neg UA  PLAN: Reassurance provided. Frequency may be due to bladder irritants... Drink water, avoid alcohol, caffeine, soda, citris, tomato, spicy foods.

## 2016-07-14 NOTE — Addendum Note (Signed)
Addended by: Mady Haagensen on: 07/14/2016 09:22 AM   Modules accepted: Orders

## 2016-09-06 IMAGING — CR DG HAND COMPLETE 3+V*L*
3 series · 3 of 3 positions shown · non-contrast
Comparison: None.

CLINICAL DATA: Pain third digit after exercise.  Initial evaluation

EXAM:
LEFT HAND - COMPLETE 3+ VIEW

[view not recorded (1 of 3)]
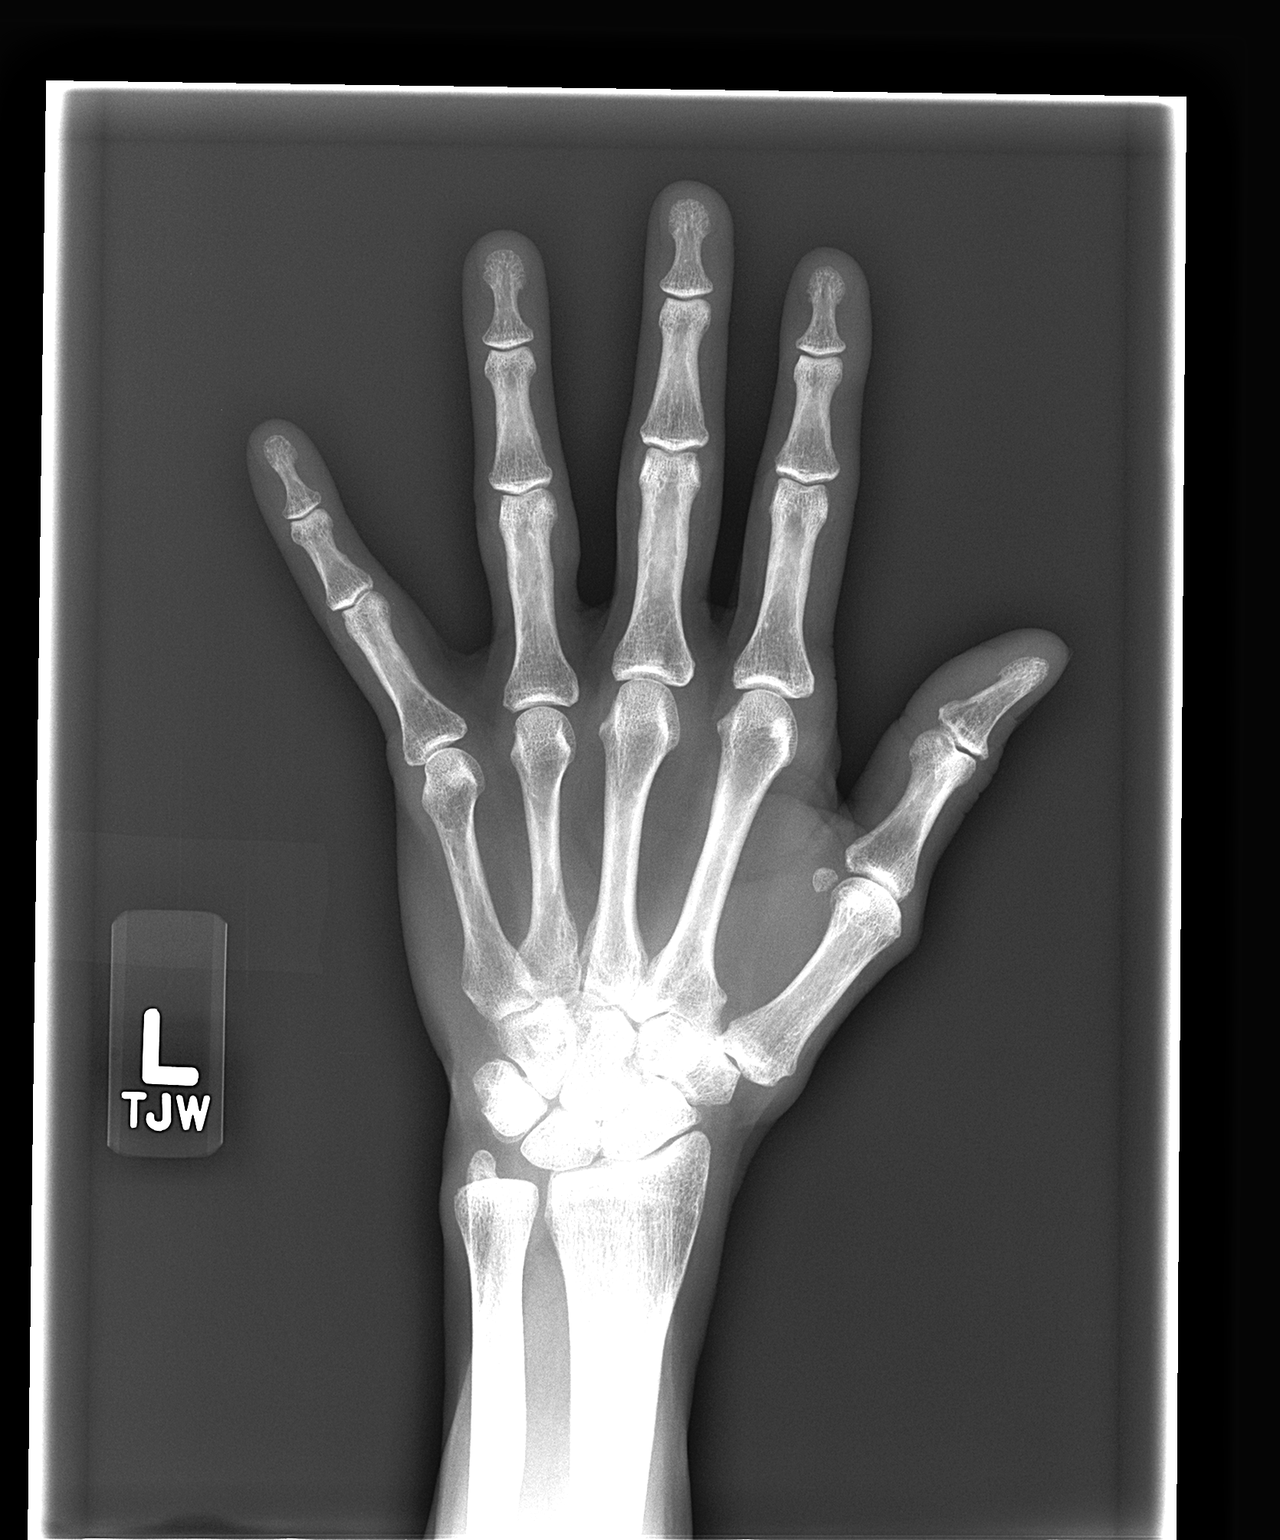

[view not recorded (2 of 3)]
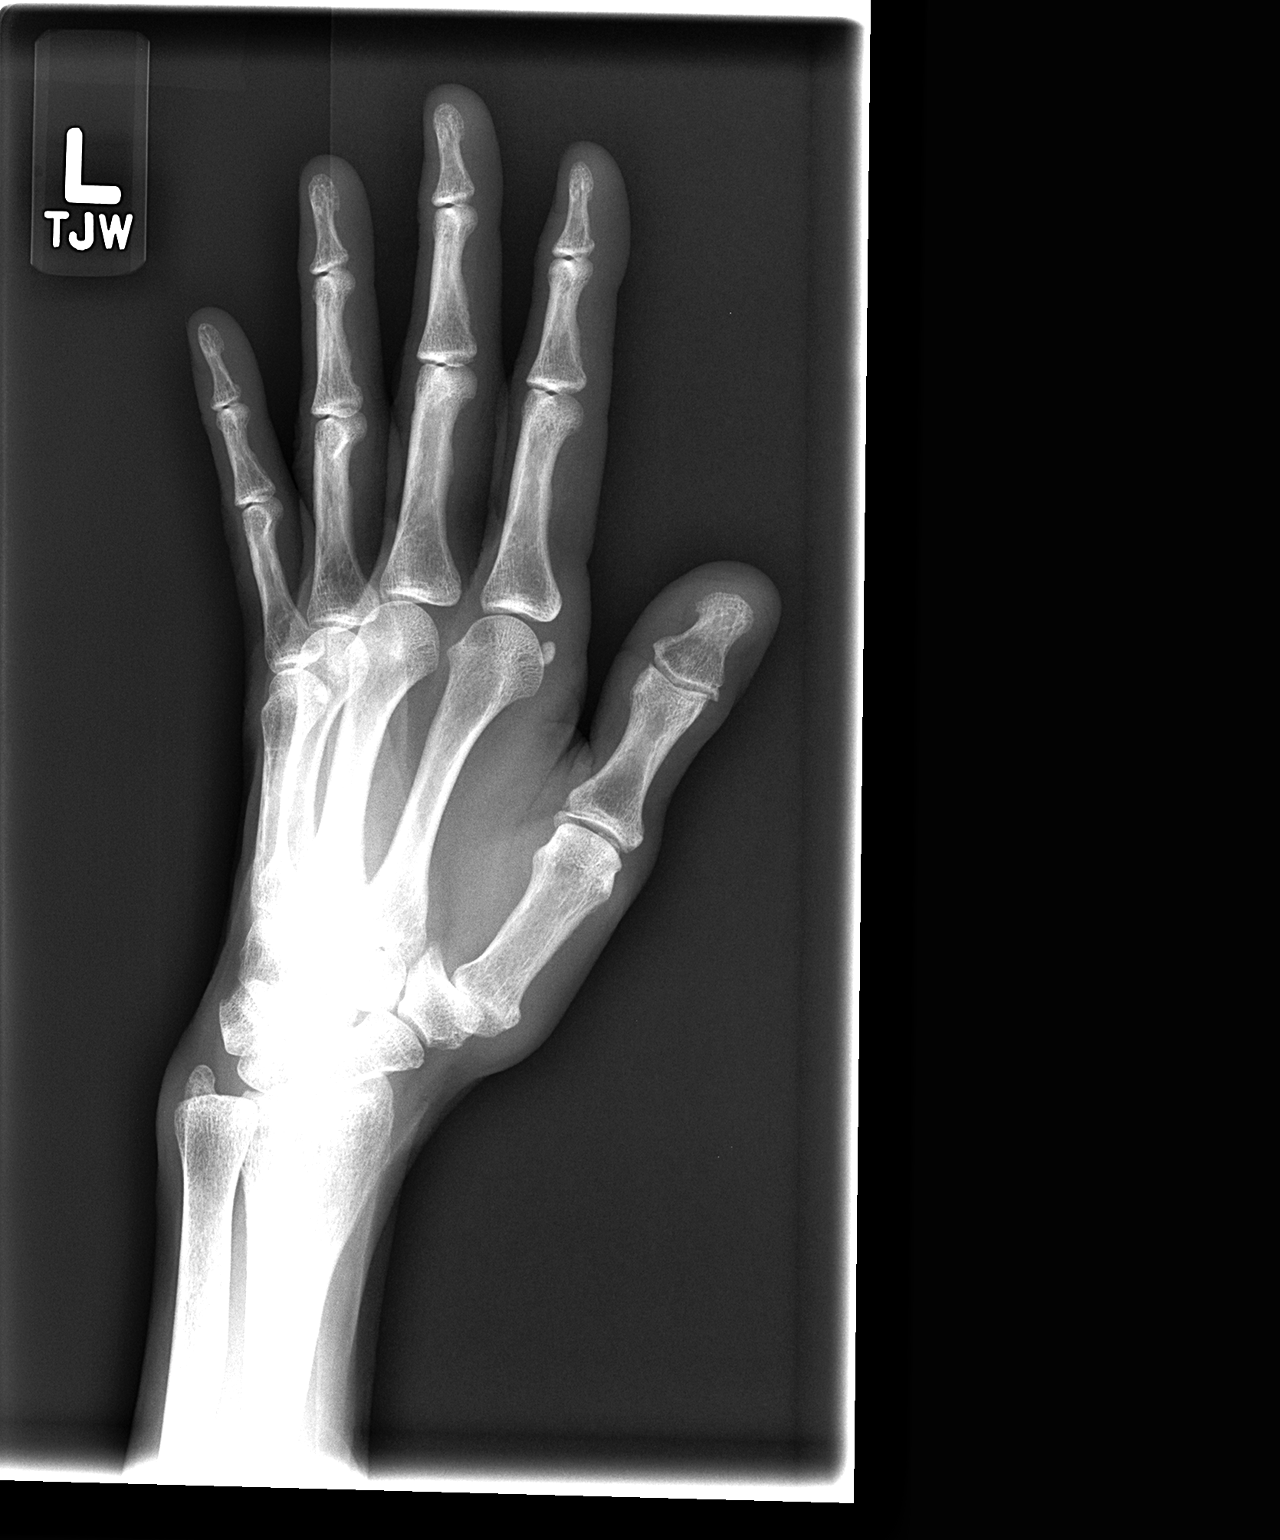

[view not recorded (3 of 3)]
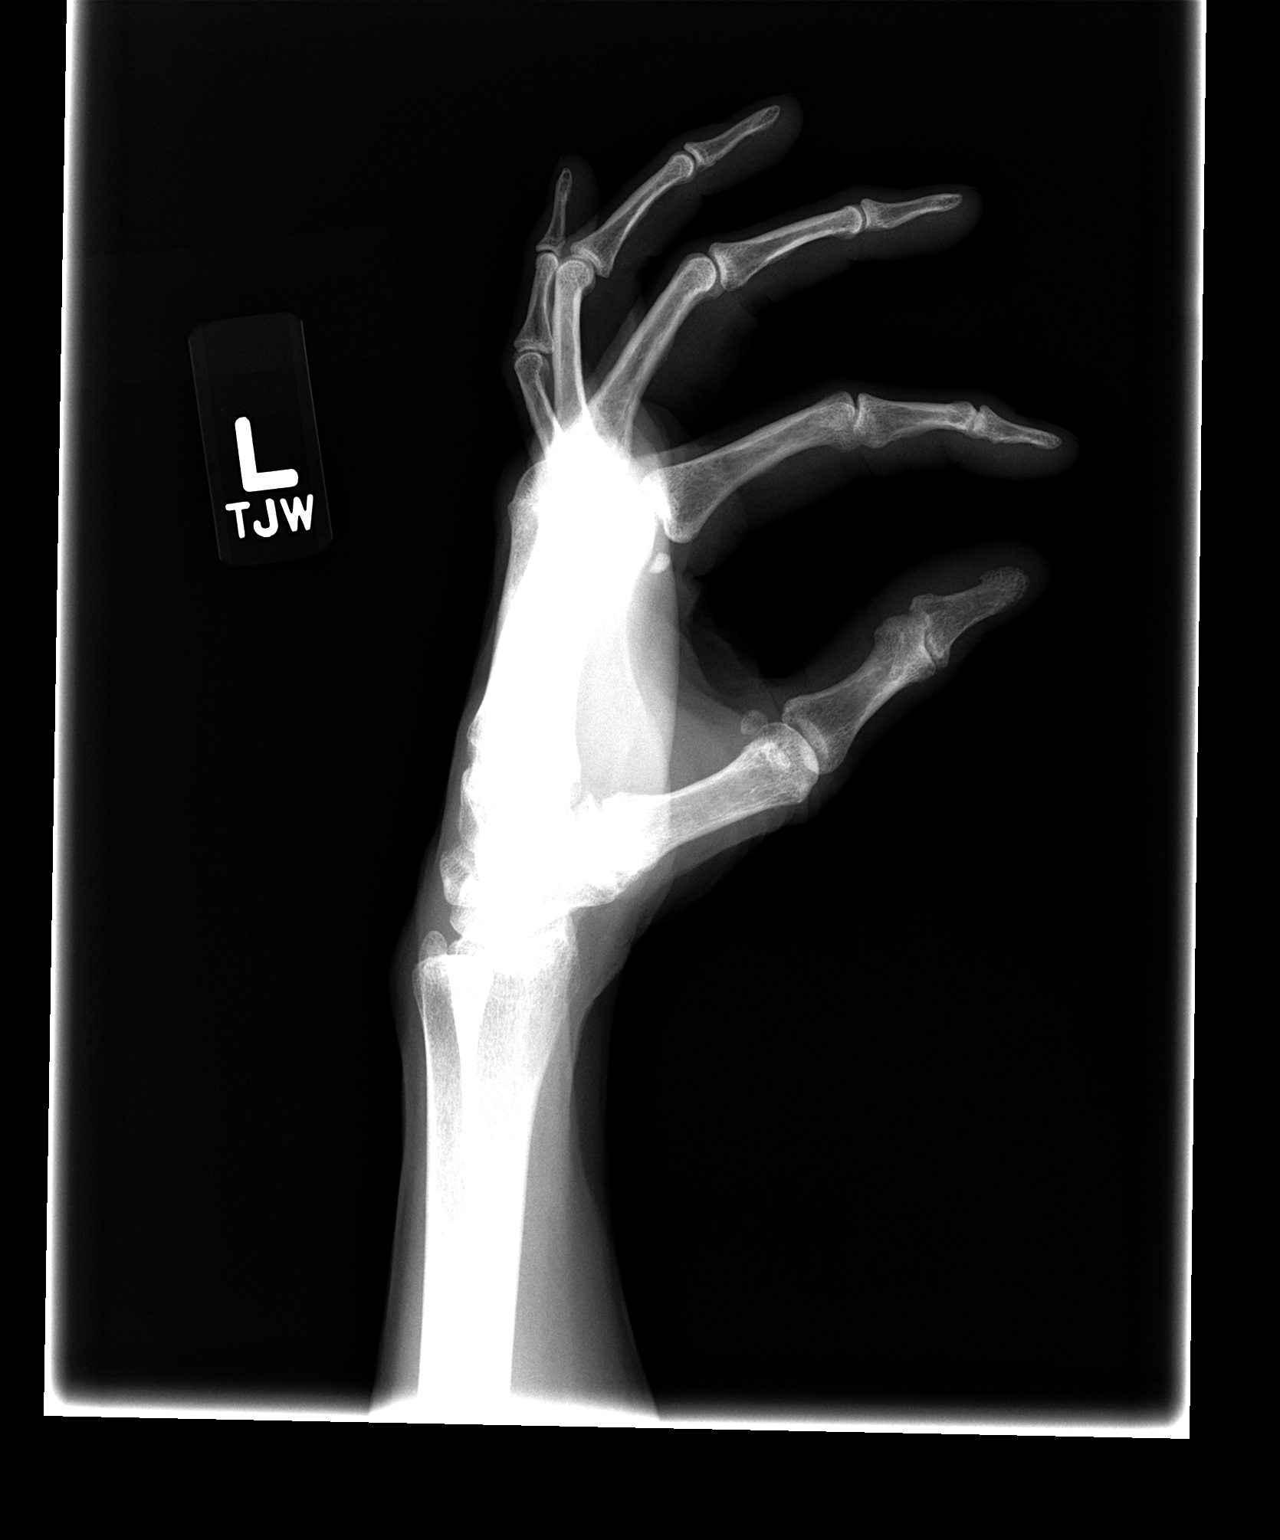

[3 of 3 positions shown; findings below may reference images not displayed]

FINDINGS: Mild diffuse degenerative change. Normal mineralization. No acute
bony or joint abnormality. No radiopaque foreign body.
IMPRESSION: Mild degenerative change.  No acute abnormality.

## 2016-09-15 ENCOUNTER — Other Ambulatory Visit: Payer: Self-pay | Admitting: Family Medicine

## 2016-09-15 DIAGNOSIS — Z01419 Encounter for gynecological examination (general) (routine) without abnormal findings: Secondary | ICD-10-CM | POA: Insufficient documentation

## 2016-09-16 ENCOUNTER — Other Ambulatory Visit (INDEPENDENT_AMBULATORY_CARE_PROVIDER_SITE_OTHER): Payer: Medicare Other

## 2016-09-16 DIAGNOSIS — Z01419 Encounter for gynecological examination (general) (routine) without abnormal findings: Secondary | ICD-10-CM | POA: Diagnosis not present

## 2016-09-16 LAB — COMPREHENSIVE METABOLIC PANEL
ALBUMIN: 4.2 g/dL (ref 3.5–5.2)
ALT: 21 U/L (ref 0–35)
AST: 27 U/L (ref 0–37)
Alkaline Phosphatase: 61 U/L (ref 39–117)
BUN: 22 mg/dL (ref 6–23)
CHLORIDE: 102 meq/L (ref 96–112)
CO2: 27 mEq/L (ref 19–32)
Calcium: 9.3 mg/dL (ref 8.4–10.5)
Creatinine, Ser: 1.01 mg/dL (ref 0.40–1.20)
GFR: 57.16 mL/min — ABNORMAL LOW (ref >60.00)
GLUCOSE: 95 mg/dL (ref 70–99)
POTASSIUM: 4.3 meq/L (ref 3.5–5.1)
SODIUM: 137 meq/L (ref 135–145)
Total Bilirubin: 0.7 mg/dL (ref 0.2–1.2)
Total Protein: 6.7 g/dL (ref 6.0–8.3)

## 2016-09-16 LAB — CBC WITH DIFFERENTIAL/PLATELET
BASOS PCT: 0.9 % (ref 0.0–3.0)
Basophils Absolute: 0 10*3/uL (ref 0.0–0.1)
Eosinophils Absolute: 0.1 10*3/uL (ref 0.0–0.7)
Eosinophils Relative: 1.2 % (ref 0.0–5.0)
HCT: 40.9 % (ref 36.0–46.0)
Hemoglobin: 13.6 g/dL (ref 12.0–15.0)
LYMPHS ABS: 1.3 10*3/uL (ref 0.7–4.0)
Lymphocytes Relative: 25.6 % (ref 12.0–46.0)
MCHC: 33.2 g/dL (ref 30.0–36.0)
MCV: 91.3 fl (ref 78.0–100.0)
Monocytes Absolute: 0.4 10*3/uL (ref 0.1–1.0)
Monocytes Relative: 7.5 % (ref 3.0–12.0)
NEUTROS ABS: 3.2 10*3/uL (ref 1.4–7.7)
NEUTROS PCT: 64.8 % (ref 43.0–77.0)
Platelets: 208 10*3/uL (ref 150.0–400.0)
RBC: 4.47 Mil/uL (ref 3.87–5.11)
RDW: 13 % (ref 11.5–15.5)
WBC: 4.9 10*3/uL (ref 4.0–10.5)

## 2016-09-16 LAB — LIPID PANEL
CHOLESTEROL: 179 mg/dL (ref 0–200)
HDL: 75.5 mg/dL (ref >39.00)
LDL CALC: 92 mg/dL (ref 0–99)
NonHDL: 103.85
Total CHOL/HDL Ratio: 2
Triglycerides: 58 mg/dL (ref 0.0–149.0)
VLDL: 11.6 mg/dL (ref 0.0–40.0)

## 2016-09-16 LAB — TSH: TSH: 0.13 u[IU]/mL — AB (ref 0.35–4.50)

## 2016-09-27 ENCOUNTER — Encounter: Payer: Self-pay | Admitting: Family Medicine

## 2016-09-27 ENCOUNTER — Ambulatory Visit (INDEPENDENT_AMBULATORY_CARE_PROVIDER_SITE_OTHER): Payer: Medicare Other | Admitting: Family Medicine

## 2016-09-27 VITALS — BP 110/74 | HR 69 | Temp 98.6°F | Ht 66.0 in | Wt 116.0 lb

## 2016-09-27 DIAGNOSIS — F329 Major depressive disorder, single episode, unspecified: Secondary | ICD-10-CM

## 2016-09-27 DIAGNOSIS — Z0000 Encounter for general adult medical examination without abnormal findings: Secondary | ICD-10-CM | POA: Diagnosis not present

## 2016-09-27 DIAGNOSIS — F32A Depression, unspecified: Secondary | ICD-10-CM

## 2016-09-27 DIAGNOSIS — E039 Hypothyroidism, unspecified: Secondary | ICD-10-CM | POA: Diagnosis not present

## 2016-09-27 DIAGNOSIS — Z8619 Personal history of other infectious and parasitic diseases: Secondary | ICD-10-CM | POA: Insufficient documentation

## 2016-09-27 MED ORDER — LEVOTHYROXINE SODIUM 100 MCG PO TABS: 100.0000 ug | ORAL_TABLET | Freq: Every day | ORAL | 3 refills | Status: DC

## 2016-09-27 MED ORDER — BUPROPION HCL ER (SR) 100 MG PO TB12: 100.0000 mg | ORAL_TABLET | Freq: Two times a day (BID) | ORAL | 3 refills | Status: DC

## 2016-09-27 NOTE — Progress Notes (Signed)
Subjective:   Patient ID: Jessica Miles, female    DOB: December 15, 1945, 71 y.o.   MRN: [id number]  Jessica Miles is a pleasant 71 y.o. year old female who presents to clinic today with Medicare Wellness  on 09/27/2016  HPI:  I have personally reviewed the Medicare Annual Wellness questionnaire and have noted 1. The patient's medical and social history 2. Their use of alcohol, tobacco or illicit drugs 3. Their current medications and supplements 4. The patient's functional ability including ADL's, fall risks, home safety risks and hearing or visual             impairment. 5. Diet and physical activities 6. Evidence for depression or mood disorders  End of life wishes discussed and updated in Social History.  The roster of all physicians providing medical care to patient - is listed in the Care[employer] section of the chart.  Zostavax 01/11/08 Colonoscopy 03/21/14 Mammogram 01/2016 Pneumovax 01/11/07 prevnar 13- 10/10/13 Td 01/11/08  Hypothyroidism-  TSH a bit low on current dose of synthroid but she would like to continue her current dose.  Feels she is clinically euthyroid. Denies any symptoms of hypo or hyperthyroidism.  Was managed by endo and TSH has been chronically low while remaining clinically euthyroid. Lab Results  Component Value Date   TSH 0.13 (L) 09/16/2016   Lab Results  Component Value Date   CHOL 179 09/16/2016   HDL 75.50 09/16/2016   LDLCALC 92 09/16/2016   TRIG 58.0 09/16/2016   CHOLHDL 2 09/16/2016    IBS- overall doing well.  Followed by GI. Following a restricted diet.  Wants to see a nutritionist to help with this.  Feels good with current dose of Wellbutrin. Current Outpatient Prescriptions on File Prior to Visit  Medication Sig Dispense Refill  . Alpha-Lipoic Acid (LIPOIC ACID PO) Take 100 mg by mouth 2 (two) times daily.    Marland Kitchen ascorbic acid (VITAMIN C) 500 MG tablet Take 500 mg by mouth 2 (two) times daily.     Marland Kitchen atenolol (TENORMIN) 25 MG tablet Take 25 mg by  mouth daily as needed.     . Calcium Carb-Cholecalciferol (CALCIUM-VITAMIN D) 600-400 MG-UNIT TABS Take 1 tablet by mouth 2 (two) times daily.    Marland Kitchen Specialty Vitamins Products (MAGNESIUM, AMINO ACID CHELATE,) 133 MG tablet Take 1 tablet by mouth 2 (two) times daily. 500 mg daily    . cyanocobalamin 2000 MCG tablet Take 2,000 mcg by mouth daily.    Marland Kitchen triamcinolone cream (KENALOG) 0.1 % Apply 1 application topically 2 (two) times daily. (Patient not taking: Reported on 09/27/2016) 30 g 0   No current facility-administered medications on file prior to visit.     Allergies  Allergen Reactions  . Allegra [Fexofenadine Hcl]     Makes heart race   . Fluticasone Other (See Comments)    Nose bleeds  . Mometasone Other (See Comments)    Epistaxis  . Fexofenadine Palpitations    Past Medical History:  Diagnosis Date  . Anxiety   . History of IBS   . HPV (human papilloma virus) infection   . Loss of taste   . Neuropathy 2015   surgery for Mortons neuroma in 1985  . Osteopenia   . Tachycardia   . Tachycardia   . Thyroid disease     Past Surgical History:  Procedure Laterality Date  . [place]  . WISDOM TOOTH EXTRACTION      Family History  Problem Relation Age  of Onset  . Heart failure Mother   . Heart attack Mother   . Heart attack Father   . Heart attack Maternal Grandfather   . Thyroid disease Neg Hx     Social History   Social History  . Marital status: Married    Spouse name: Mercie Eon. [place]  . Number of children: 2  . Years of education: MA   Occupational History  . Retired    Social History Main Topics  . Smoking status: Never Smoker  . Smokeless tobacco: Never Used  . Alcohol use Yes     Comment: 2-3 glasses per week  . Drug use: No  . Sexual activity: Yes   Other Topics Concern  . Not on file   Social History Narrative   Patient lives at [place] with spouse.   Caffeine Use: 1 cup daily   2 kids- 4 grandchildren- two boys and  two girls         The PMH, PSH, Social History, Family History, Medications, and allergies have been reviewed in [facility], and have been updated if relevant.   Review of Systems  Constitutional: Negative.   HENT: Negative.  Negative for sneezing, sore throat, trouble swallowing and voice change.   Respiratory: Negative.   Cardiovascular: Negative.   Gastrointestinal: Negative for abdominal distention, abdominal pain, anal bleeding, blood in stool, constipation, diarrhea, nausea, rectal pain and vomiting.  Endocrine: Negative.   Genitourinary: Negative.   Musculoskeletal: Negative.   Skin: Negative.   Allergic/Immunologic: Negative.   Neurological: Negative.   Hematological: Negative.   Psychiatric/Behavioral: Negative.   All other systems reviewed and are negative.      Objective:    BP 110/74 (BP Location: Left Arm, Patient Position: Sitting, Cuff Size: Normal)   Pulse 69   Temp 98.6 F (37 C) (Oral)   Ht 5\' 6"  (1.676 m)   Wt 116 lb (52.6 kg)   SpO2 96%   BMI 18.72 kg/m   Wt Readings from Last 3 Encounters:  09/27/16 116 lb (52.6 kg)  07/14/16 118 lb (53.5 kg)  11/11/15 116 lb 9.6 oz (52.9 kg)     Physical Exam  Constitutional: She is oriented to person, place, and time. She appears well-developed and well-nourished. No distress.  HENT:  Head: Normocephalic.  Eyes: Conjunctivae are normal.  Neck: Normal range of motion.  Cardiovascular: Normal rate.   Pulmonary/Chest: Effort normal.  Musculoskeletal: Normal range of motion. She exhibits no edema.  Neurological: She is alert and oriented to person, place, and time. No cranial nerve deficit.  Skin: Skin is warm and dry.  Psychiatric: She has a normal mood and affect. Her behavior is normal. Judgment and thought content normal.  Nursing note and vitals reviewed.         Assessment & Plan:   Hypothyroidism, unspecified type  Medicare annual wellness visit, subsequent  Depression, unspecified depression  type No Follow-up on file.

## 2016-09-27 NOTE — Assessment & Plan Note (Signed)
Clinically euthyroid.  No changes made to synthroid dose.

## 2016-09-27 NOTE — Assessment & Plan Note (Signed)
Stable on current rx. No changes made.

## 2016-09-27 NOTE — Assessment & Plan Note (Signed)
The patients weight, height, BMI and visual acuity have been recorded in the chart.  Cognitive function assessed.   I have made referrals, counseling and provided education to the patient based review of the above and I have provided the pt with a written personalized care plan for preventive services.  

## 2016-09-28 ENCOUNTER — Other Ambulatory Visit: Payer: Self-pay | Admitting: Family Medicine

## 2016-09-28 DIAGNOSIS — Z1239 Encounter for other screening for malignant neoplasm of breast: Secondary | ICD-10-CM

## 2017-01-10 HISTORY — PX: COLONOSCOPY: SHX174

## 2017-05-22 ENCOUNTER — Ambulatory Visit: Payer: Medicare Other

## 2017-05-22 ENCOUNTER — Ambulatory Visit (INDEPENDENT_AMBULATORY_CARE_PROVIDER_SITE_OTHER): Payer: Medicare Other | Admitting: Podiatry

## 2017-05-22 ENCOUNTER — Encounter: Payer: Self-pay | Admitting: Podiatry

## 2017-05-22 DIAGNOSIS — M778 Other enthesopathies, not elsewhere classified: Secondary | ICD-10-CM

## 2017-05-22 DIAGNOSIS — M779 Enthesopathy, unspecified: Secondary | ICD-10-CM

## 2017-05-22 DIAGNOSIS — M7750 Other enthesopathy of unspecified foot: Secondary | ICD-10-CM | POA: Diagnosis not present

## 2017-05-22 DIAGNOSIS — Q828 Other specified congenital malformations of skin: Secondary | ICD-10-CM | POA: Diagnosis not present

## 2017-05-22 NOTE — Progress Notes (Signed)
She presents today for small corns on the second toes bilateral third and fourth toes of the right foot for the past 2 months she reports that the bilateral second toes have become pretty painful at times only relief that she has been able to obtain is wearing sandals and using CBD oil.  Objective: Vital signs are stable she is alert and oriented x3.  Pulses are palpable.  She has atrophy of a lot of the fat in her soft tissues she is currently has hammertoe deformities and hallux valgus deformities and tailor's bunion deformities squeezing on all of the toes.  And without the subcutaneous fat this is rubbing the small porokeratotic lesions on to the aforementioned toes.  There is no broken skin there is no open lesions  Assessment: Porokeratosis bilateral.  Plan: Debrided all reactive hyperkeratotic tissue.

## 2017-07-30 ENCOUNTER — Emergency Department
Admission: EM | Admit: 2017-07-30 | Discharge: 2017-07-30 | Disposition: A | Discharge status: Home / Self Care | Payer: Medicare Other | Attending: Student in an Organized Health Care Education/Training Program | Admitting: Student in an Organized Health Care Education/Training Program

## 2017-07-30 ENCOUNTER — Other Ambulatory Visit: Payer: Self-pay

## 2017-07-30 ENCOUNTER — Encounter: Payer: Self-pay | Admitting: Emergency Medicine

## 2017-07-30 DIAGNOSIS — Y929 Unspecified place or not applicable: Secondary | ICD-10-CM | POA: Insufficient documentation

## 2017-07-30 DIAGNOSIS — S61012A Laceration without foreign body of left thumb without damage to nail, initial encounter: Secondary | ICD-10-CM | POA: Insufficient documentation

## 2017-07-30 DIAGNOSIS — Y998 Other external cause status: Secondary | ICD-10-CM | POA: Insufficient documentation

## 2017-07-30 DIAGNOSIS — S6992XA Unspecified injury of left wrist, hand and finger(s), initial encounter: Secondary | ICD-10-CM | POA: Diagnosis present

## 2017-07-30 DIAGNOSIS — E039 Hypothyroidism, unspecified: Secondary | ICD-10-CM | POA: Insufficient documentation

## 2017-07-30 DIAGNOSIS — Y9389 Activity, other specified: Secondary | ICD-10-CM | POA: Insufficient documentation

## 2017-07-30 DIAGNOSIS — W231XXA Caught, crushed, jammed, or pinched between stationary objects, initial encounter: Secondary | ICD-10-CM | POA: Diagnosis not present

## 2017-07-30 DIAGNOSIS — Z79899 Other long term (current) drug therapy: Secondary | ICD-10-CM | POA: Diagnosis not present

## 2017-07-30 MED ORDER — LIDOCAINE HCL (PF) 1 % IJ SOLN
5.0000 mL | Freq: Once | INTRAMUSCULAR | Status: AC
  Administered 2017-07-30: 5 mL
  Filled 2017-07-30: qty 5

## 2017-07-30 MED ORDER — BACITRACIN ZINC 500 UNIT/GM EX OINT
1.0000 "application " | TOPICAL_OINTMENT | Freq: Once | CUTANEOUS | Status: AC
  Administered 2017-07-30: 1 via TOPICAL
  Filled 2017-07-30: qty 0.9

## 2017-07-30 NOTE — ED Triage Notes (Signed)
Pt comes into the ED via POV c/o laceration to the left thumb after trying to put her kickstand down on her bike.  Patient has all bleeding under control and is in NAD>

## 2017-07-30 NOTE — ED Provider Notes (Signed)
[facility] Emergency Department Provider Note  ____________________________________________   First MD Initiated Contact with Patient 07/30/17 1448     (approximate)  I have reviewed the triage vital signs and the nursing notes.   HISTORY  Chief Complaint Laceration    HPI Jessica Miles is a 72 y.o. female presents emergency department complaining of a laceration to the left thumb.  She states she was trying to put her kickstand down and the pad of her finger got caught in the stand.  states her tetanus is less than 10 years.  Past Medical History:  Diagnosis Date  . Anxiety   . History of IBS   . HPV (human papilloma virus) infection   . Loss of taste   . Neuropathy 2015   surgery for Mortons neuroma in 1985  . Osteopenia   . Tachycardia   . Tachycardia   . Thyroid disease     Patient Active Problem List   Diagnosis Date Noted  . Medicare annual wellness visit, subsequent 09/27/2016  . Dry eyes 09/21/2015  . Atopic dermatitis 03/13/2015  . IBS (irritable bowel syndrome) 03/18/2014  . Osteopenia 03/18/2014  . Depression 03/18/2014  . Hypothyroidism 12/14/2013    Past Surgical History:  Procedure Laterality Date  . [place]  . WISDOM TOOTH EXTRACTION      Prior to Admission medications   Medication Sig Start Date End Date Taking? Authorizing Provider  Alpha-Lipoic Acid (LIPOIC ACID PO) Take 100 mg by mouth 2 (two) times daily.    [provider]  ascorbic acid (VITAMIN C) 500 MG tablet Take 500 mg by mouth 2 (two) times daily.     [provider]  buPROPion (WELLBUTRIN SR) 100 MG 12 hr tablet Take 1 tablet (100 mg total) by mouth 2 (two) times daily. 09/27/16   Lucille Passy, MD  Calcium Carb-Cholecalciferol (CALCIUM-VITAMIN D) 600-400 MG-UNIT TABS Take 1 tablet by mouth 2 (two) times daily.    [provider]  cyanocobalamin 2000 MCG tablet Take 2,000 mcg by mouth daily.    [provider]  levothyroxine (SYNTHROID, LEVOTHROID) 100 MCG tablet Take 1 tablet (100 mcg total) by mouth daily. 09/27/16   Lucille Passy, MD  Specialty Vitamins Products (MAGNESIUM, AMINO ACID CHELATE,) 133 MG tablet Take 1 tablet by mouth 2 (two) times daily. 500 mg daily    [provider]    Allergies Allegra [fexofenadine hcl]; Fluticasone; Mometasone; and Fexofenadine  Family History  Problem Relation Age of Onset  . Heart failure Mother   . Heart attack Mother   . Heart attack Father   . Heart attack Maternal Grandfather   . Thyroid disease Neg Hx     Social History Social History   Tobacco Use  . Smoking status: Never Smoker  . Smokeless tobacco: Never Used  Substance Use Topics  . Alcohol use: Yes    Comment: 2-3 glasses per week  . Drug use: No    Review of Systems  Constitutional: No fever/chills Eyes: No visual changes. ENT: No sore throat. Respiratory: Denies cough Genitourinary: Negative for dysuria. Musculoskeletal: Negative for back pain. Skin: Negative for rash.  Positive for laceration to the left thumb    ____________________________________________   PHYSICAL EXAM:  VITAL SIGNS: ED Triage Vitals [07/30/17 1433]  Enc Vitals Group     BP (!) 123/56     Pulse Rate 78     Resp 16     Temp 97.9  F (36.6 C)     Temp Source Oral     SpO2 99 %     Weight 114 lb (51.7 kg)     Height 5\' 6"  (1.676 m)     Head Circumference      Peak Flow      Pain Score 3     Pain Loc      Pain Edu?      Excl. in [place]?     Constitutional: Alert and oriented. Well appearing and in no acute distress. Eyes: Conjunctivae are normal.  Head: Atraumatic. Nose: No congestion/rhinnorhea. Mouth/Throat: Mucous membranes are moist.   Cardiovascular: Normal rate, regular rhythm. Respiratory: Normal respiratory effort.  No retractions GU: deferred Musculoskeletal: FROM all extremities, warm and well perfused.  No bony tenderness of the left thumb,  neurovascular is intact Neurologic:  Normal speech and language.  Skin:  Skin is warm, dry.  Positive for a circular laceration to the left thumb.  The skin is hanging at the edge.  There is active bleeding noted.  No foreign body is noted.  Psychiatric: Mood and affect are normal. Speech and behavior are normal.  ____________________________________________   LABS (all labs ordered are listed, but only abnormal results are displayed)  Labs Reviewed - No data to display ____________________________________________   ____________________________________________  RADIOLOGY    ____________________________________________   PROCEDURES  Procedure(s) performed:   Marland KitchenMarland KitchenLaceration Repair Date/Time: 07/30/2017 3:31 PM Performed by: Versie Starks, PA-C Authorized by: Versie Starks, PA-C   Consent:    Consent obtained:  Verbal   Consent given by:  Patient   Risks discussed:  Infection, pain and poor wound healing   Alternatives discussed:  No treatment Anesthesia (see MAR for exact dosages):    Anesthesia method:  Nerve block   Block needle gauge:  25 G   Block anesthetic:  Lidocaine 1% w/o epi   Block injection procedure:  Anatomic landmarks identified, introduced needle, incremental injection, anatomic landmarks palpated and negative aspiration for blood   Block outcome:  Anesthesia achieved Laceration details:    Location:  Finger   Finger location:  L thumb   Length (cm):  2   Depth (mm):  2 Repair type:    Repair type:  Simple Pre-procedure details:    Preparation:  Patient was prepped and draped in usual sterile fashion Exploration:    Hemostasis achieved with:  Direct pressure   Wound exploration: wound explored through full range of motion     Wound extent: no foreign bodies/material noted, no muscle damage noted, no tendon damage noted and no underlying fracture noted     Contaminated: no   Treatment:    Area cleansed with:  Betadine and saline   Amount of  cleaning:  Standard   Irrigation solution:  Sterile saline   Irrigation method:  Tap   Visualized foreign bodies/material removed: no   Skin repair:    Repair method:  Sutures   Suture size:  5-0   Suture material:  Nylon   Number of sutures:  4 Approximation:    Approximation:  Close Post-procedure details:    Dressing:  Antibiotic ointment and non-adherent dressing   Patient tolerance of procedure:  Tolerated well, no immediate complications      ____________________________________________   INITIAL IMPRESSION / ASSESSMENT AND PLAN / ED COURSE  Pertinent labs & imaging results that were available during my care of the patient were reviewed by me and considered in my medical decision making (see  chart for details).  Patient is a 72 year old female presents emergency department with laceration to left thumb.  States she was putting the kickstand down on her bike and her finger got caught in the stand.  She states her tetanus is up-to-date as it was less than 10 years.  On physical exam the left thumb has a circular type laceration on the pad of the thumb.  There is no bony tenderness or foreign body noted  A digital block was performed, the area was cleaned.  5-0 Ethilon was used for 4 simple sutures.  Patient tolerated procedure well.  Explained the care of the sutures to patient.  She is to have sutures removed in 7 to 10 days.  She can follow-up with her regular doctor return to the emergency department.  She states she understands comply with instructions.  Was discharged in stable condition     As part of my medical decision making, I reviewed the following data within the [place] notes reviewed and incorporated, Notes from prior ED visits and [place] Controlled Substance Database  ____________________________________________   FINAL CLINICAL IMPRESSION(S) / ED DIAGNOSES  Final diagnoses:  Laceration of left thumb without foreign body without  damage to nail, initial encounter      NEW MEDICATIONS STARTED DURING THIS VISIT:  New Prescriptions   No medications on file     Note:  This document was prepared using Dragon voice recognition software and may include unintentional dictation errors.    Versie Starks, PA-C 07/30/17 1535    Merlyn Lot, MD 07/30/17 [phone]

## 2017-07-30 NOTE — ED Notes (Signed)

## 2017-07-30 NOTE — Discharge Instructions (Addendum)
Clean the area with soap and water daily.  Apply Vaseline to the suture line.  Return to the emergency department or see your regular doctor if there is any sign of infection such as redness, pus, or increased pain.

## 2017-08-02 ENCOUNTER — Ambulatory Visit: Payer: Medicare Other | Admitting: Podiatry

## 2017-08-07 ENCOUNTER — Encounter: Payer: Self-pay | Admitting: Emergency Medicine

## 2017-08-07 ENCOUNTER — Emergency Department
Admission: EM | Admit: 2017-08-07 | Discharge: 2017-08-07 | Disposition: A | Discharge status: Home / Self Care | Payer: Medicare Other | Attending: Student in an Organized Health Care Education/Training Program | Admitting: Student in an Organized Health Care Education/Training Program

## 2017-08-07 DIAGNOSIS — Z4802 Encounter for removal of sutures: Secondary | ICD-10-CM | POA: Diagnosis present

## 2017-08-07 NOTE — Discharge Instructions (Addendum)
No creams or ointment to suture area.

## 2017-08-07 NOTE — ED Triage Notes (Signed)
Pt had sutures placed 8 days ago and is back to have them removed today.

## 2017-08-07 NOTE — ED Provider Notes (Signed)
[facility] Emergency Department Provider Note   ____________________________________________   First MD Initiated Contact with Patient 08/07/17 1406     (approximate)  I have reviewed the triage vital signs and the nursing notes.   HISTORY  Chief Complaint Suture / Staple Removal    HPI Jessica Miles is a 73 y.o. female patient presents for suture removal secondary to laceration to her left thumb.  Thumb was sutured 8 days ago.  Patient denies pain but suspect healing is not complete.  Patient denies pain at this time.  Patient is right-hand dominant.   Past Medical History:  Diagnosis Date  . Anxiety   . History of IBS   . HPV (human papilloma virus) infection   . Loss of taste   . Neuropathy 2015   surgery for Mortons neuroma in 1985  . Osteopenia   . Tachycardia   . Tachycardia   . Thyroid disease     Patient Active Problem List   Diagnosis Date Noted  . Medicare annual wellness visit, subsequent 09/27/2016  . Dry eyes 09/21/2015  . Atopic dermatitis 03/13/2015  . IBS (irritable bowel syndrome) 03/18/2014  . Osteopenia 03/18/2014  . Depression 03/18/2014  . Hypothyroidism 12/14/2013    Past Surgical History:  Procedure Laterality Date  . [place]  . WISDOM TOOTH EXTRACTION      Prior to Admission medications   Medication Sig Start Date End Date Taking? Authorizing Provider  Alpha-Lipoic Acid (LIPOIC ACID PO) Take 100 mg by mouth 2 (two) times daily.    [provider]  ascorbic acid (VITAMIN C) 500 MG tablet Take 500 mg by mouth 2 (two) times daily.     [provider]  buPROPion (WELLBUTRIN SR) 100 MG 12 hr tablet Take 1 tablet (100 mg total) by mouth 2 (two) times daily. 09/27/16   Lucille Passy, MD  Calcium Carb-Cholecalciferol (CALCIUM-VITAMIN D) 600-400 MG-UNIT TABS Take 1 tablet by mouth 2 (two) times daily.    [provider]  cyanocobalamin 2000 MCG tablet Take 2,000 mcg by mouth  daily.    [provider]  levothyroxine (SYNTHROID, LEVOTHROID) 100 MCG tablet Take 1 tablet (100 mcg total) by mouth daily. 09/27/16   Lucille Passy, MD  Specialty Vitamins Products (MAGNESIUM, AMINO ACID CHELATE,) 133 MG tablet Take 1 tablet by mouth 2 (two) times daily. 500 mg daily    [provider]    Allergies Allegra [fexofenadine hcl]; Fluticasone; Mometasone; and Fexofenadine  Family History  Problem Relation Age of Onset  . Heart failure Mother   . Heart attack Mother   . Heart attack Father   . Heart attack Maternal Grandfather   . Thyroid disease Neg Hx     Social History Social History   Tobacco Use  . Smoking status: Never Smoker  . Smokeless tobacco: Never Used  Substance Use Topics  . Alcohol use: Yes    Comment: 2-3 glasses per week  . Drug use: No    Review of Systems  Constitutional: No fever/chills Eyes: No visual changes. ENT: No sore throat. Cardiovascular: Denies chest pain. Respiratory: Denies shortness of breath. Gastrointestinal: No abdominal pain.  No nausea, no vomiting.  No diarrhea.  No constipation. Genitourinary: Negative for dysuria. Musculoskeletal: Negative for back pain. Skin: Negative for rash.  Laceration left thumb. Neurological: Negative for headaches, focal weakness or numbness. Psychiatric:Depression. Endocrine:Hypothyroidism. Hematological/Lymphatic: Allergic/Immunilogical: See medication list. ____________________________________________   PHYSICAL EXAM:  VITAL SIGNS: ED Triage  Vitals [08/07/17 1406]  Enc Vitals Group     BP 119/65     Pulse      Resp 18     Temp 97.9 F (36.6 C)     Temp Source Oral     SpO2 98 %     Weight 114 lb (51.7 kg)     Height 5\' 6"  (1.676 m)     Head Circumference      Peak Flow      Pain Score 0     Pain Loc      Pain Edu?      Excl. in [place]?     Constitutional: Alert and oriented. Well appearing and in no acute distress. Cardiovascular: Normal rate,  regular rhythm. Grossly normal heart sounds.  Good peripheral circulation. Respiratory: Normal respiratory effort.  No retractions. Lungs CTAB. Neurologic:  Normal speech and language. No gross focal neurologic deficits are appreciated. No gait instability. Skin:  Skin is warm, moist, and intact. No rash noted.  Healing is not complete. Psychiatric: Mood and affect are normal. Speech and behavior are normal.  ____________________________________________   LABS (all labs ordered are listed, but only abnormal results are displayed)  Labs Reviewed - No data to display ____________________________________________  EKG   ____________________________________________  RADIOLOGY  ED MD interpretation:    Official radiology report(s): No results found.  ____________________________________________   PROCEDURES  Procedure(s) performed: None  Procedures  Critical Care performed: No  ____________________________________________   INITIAL IMPRESSION / ASSESSMENT AND PLAN / ED COURSE  As part of my medical decision making, I reviewed the following data within the [place]    Patient presents for suture removal.  Advised patient he will is not completing out to remove sutures.  Advised dry dressing or no dressing.  No creams or ointment to suture area.  Return back in 2 days for suture removal.      ____________________________________________   FINAL CLINICAL IMPRESSION(S) / ED DIAGNOSES  Final diagnoses:  Encounter for removal of sutures     ED Discharge Orders    None       Note:  This document was prepared using Dragon voice recognition software and may include unintentional dictation errors.    Sable Feil, PA-C 08/07/17 1416    Schaevitz, Randall An, MD 08/07/17 1524

## 2017-08-14 ENCOUNTER — Encounter: Payer: Self-pay | Admitting: Podiatry

## 2017-08-14 ENCOUNTER — Ambulatory Visit (INDEPENDENT_AMBULATORY_CARE_PROVIDER_SITE_OTHER): Payer: Medicare Other | Admitting: Podiatry

## 2017-08-14 DIAGNOSIS — L600 Ingrowing nail: Secondary | ICD-10-CM | POA: Diagnosis not present

## 2017-08-14 NOTE — Progress Notes (Signed)
She presents today concerned that she may have an ingrown toenail tibial border of the hallux left.  Is that he really does not bother me all the time is not red is not sore but is been going on for about 2 months now.  Objective: Vital signs are stable she is alert oriented x3 pulses strong palpable.  Sharp incurvated nail margin distal aspect of the hallux nail plate left which appears to be resolving.  I debrided today and is a small callus beneath it.  This is more than likely resulting in her symptoms.  Assessment: Ingrown nail hallux left tibial border.  Plan: Debrided small portion of the nail today follow-up with her as needed

## 2017-08-31 ENCOUNTER — Ambulatory Visit (INDEPENDENT_AMBULATORY_CARE_PROVIDER_SITE_OTHER): Payer: Medicare Other | Admitting: Family Medicine

## 2017-08-31 ENCOUNTER — Encounter: Payer: Self-pay | Admitting: Family Medicine

## 2017-08-31 VITALS — BP 110/70 | HR 69 | Temp 98.4°F | Ht 66.0 in | Wt 115.2 lb

## 2017-08-31 DIAGNOSIS — I471 Supraventricular tachycardia: Secondary | ICD-10-CM | POA: Insufficient documentation

## 2017-08-31 DIAGNOSIS — E039 Hypothyroidism, unspecified: Secondary | ICD-10-CM | POA: Diagnosis not present

## 2017-08-31 DIAGNOSIS — M8580 Other specified disorders of bone density and structure, unspecified site: Secondary | ICD-10-CM | POA: Diagnosis not present

## 2017-08-31 DIAGNOSIS — R000 Tachycardia, unspecified: Secondary | ICD-10-CM

## 2017-08-31 DIAGNOSIS — K582 Mixed irritable bowel syndrome: Secondary | ICD-10-CM

## 2017-08-31 DIAGNOSIS — Z13220 Encounter for screening for lipoid disorders: Secondary | ICD-10-CM | POA: Diagnosis not present

## 2017-08-31 DIAGNOSIS — G629 Polyneuropathy, unspecified: Secondary | ICD-10-CM | POA: Insufficient documentation

## 2017-08-31 DIAGNOSIS — L209 Atopic dermatitis, unspecified: Secondary | ICD-10-CM | POA: Diagnosis not present

## 2017-08-31 DIAGNOSIS — F331 Major depressive disorder, recurrent, moderate: Secondary | ICD-10-CM

## 2017-08-31 DIAGNOSIS — G609 Hereditary and idiopathic neuropathy, unspecified: Secondary | ICD-10-CM

## 2017-08-31 LAB — VITAMIN D 25 HYDROXY (VIT D DEFICIENCY, FRACTURES): VITD: 61.24 ng/mL (ref 30.00–100.00)

## 2017-08-31 LAB — LIPID PANEL
Cholesterol: 190 mg/dL (ref 0–200)
HDL: 79.7 mg/dL (ref >39.00)
LDL Cholesterol: 98 mg/dL (ref 0–99)
NonHDL: 109.89
TRIGLYCERIDES: 57 mg/dL (ref 0.0–149.0)
Total CHOL/HDL Ratio: 2
VLDL: 11.4 mg/dL (ref 0.0–40.0)

## 2017-08-31 LAB — COMPREHENSIVE METABOLIC PANEL
ALT: 21 U/L (ref 0–35)
AST: 24 U/L (ref 0–37)
Albumin: 4.4 g/dL (ref 3.5–5.2)
Alkaline Phosphatase: 67 U/L (ref 39–117)
BUN: 27 mg/dL — ABNORMAL HIGH (ref 6–23)
CALCIUM: 9.7 mg/dL (ref 8.4–10.5)
CHLORIDE: 103 meq/L (ref 96–112)
CO2: 31 mEq/L (ref 19–32)
CREATININE: 1.16 mg/dL (ref 0.40–1.20)
GFR: 48.59 mL/min — AB (ref >60.00)
Glucose, Bld: 91 mg/dL (ref 70–99)
Potassium: 4.9 mEq/L (ref 3.5–5.1)
Sodium: 139 mEq/L (ref 135–145)
Total Bilirubin: 0.6 mg/dL (ref 0.2–1.2)
Total Protein: 6.8 g/dL (ref 6.0–8.3)

## 2017-08-31 LAB — T4, FREE: FREE T4: 1 ng/dL (ref 0.60–1.60)

## 2017-08-31 LAB — TSH: TSH: 0.48 u[IU]/mL (ref 0.35–4.50)

## 2017-08-31 LAB — T3, FREE: T3, Free: 2.9 pg/mL (ref 2.3–4.2)

## 2017-08-31 MED ORDER — LEVOTHYROXINE SODIUM 100 MCG PO TABS: 100.0000 ug | ORAL_TABLET | Freq: Every day | ORAL | 3 refills | Status: DC

## 2017-08-31 MED ORDER — BUPROPION HCL ER (SR) 100 MG PO TB12: 100.0000 mg | ORAL_TABLET | Freq: Two times a day (BID) | ORAL | 3 refills | Status: DC

## 2017-08-31 NOTE — Assessment & Plan Note (Signed)
Followed by Dermatology 

## 2017-08-31 NOTE — Assessment & Plan Note (Signed)
Well controlled on bupropion, no SE.  Sleeping well at night. CBD oil helps as well.

## 2017-08-31 NOTE — Progress Notes (Signed)
   Subjective:    Patient ID: Jessica Miles, female    DOB: [birth year], 72 y.o.   MRN: [id number]  HPI  72 year old female presents to transfer care From Dr. Deborra Medina to myself.   Atopic dermatitis mainly on face, cheekbone followed by Dermatology.. Considering [place].  Hypothyroid:  Due for re-eval.  MDD , well controlled on wellbutrin. Depression screen [facility] 2/9 08/31/2017 09/27/2016 08/27/2015  Decreased Interest 0 0 0  Down, Depressed, Hopeless 0 0 0  PHQ - 2 Score 0 0 0   IBS: off and on flares, Eats very healthy foods, controls this with out meds. Followed by Dr. Vira Agar. Last colonoscopy 2012 repeat 2022. Has tried CBD oil now for her symptoms.  She is due for her yearly wellness visit.  Social History /Family History/Past Medical History reviewed in detail and updated in EMR if needed. Blood pressure 110/70, pulse 69, temperature 98.4 F (36.9 C), temperature source Oral, height 5\' 6"  (1.676 m), weight 115 lb 4 oz (52.3 kg).  Review of Systems  Constitutional: Negative for fatigue and fever.  HENT: Negative for congestion.   Eyes: Negative for pain.  Respiratory: Negative for cough and shortness of breath.   Cardiovascular: Negative for chest pain, palpitations and leg swelling.  Gastrointestinal: Negative for abdominal pain.  Genitourinary: Negative for dysuria and vaginal bleeding.  Musculoskeletal: Negative for back pain.  Neurological: Negative for syncope, light-headedness and headaches.  Psychiatric/Behavioral: Negative for dysphoric mood.       Objective:   Physical Exam  Constitutional: Vital signs are normal. She appears well-developed and well-nourished. She is cooperative.  Non-toxic appearance. She does not appear ill. No distress.  HENT:  Head: Normocephalic.  Right Ear: Hearing, tympanic membrane, external ear and ear canal normal.  Left Ear: Hearing, tympanic membrane, external ear and ear canal normal.  Nose: Nose normal.  Eyes: Pupils are equal, round,  and reactive to light. Conjunctivae, EOM and lids are normal. Lids are everted and swept, no foreign bodies found.  Neck: Trachea normal and normal range of motion. Neck supple. Carotid bruit is not present. No thyroid mass and no thyromegaly present.  Cardiovascular: Normal rate, regular rhythm, S1 normal, S2 normal, normal heart sounds and intact distal pulses. Exam reveals no gallop.  No murmur heard. Pulmonary/Chest: Effort normal and breath sounds normal. No respiratory distress. She has no wheezes. She has no rhonchi. She has no rales.  Abdominal: Soft. Normal appearance and bowel sounds are normal. She exhibits no distension, no fluid wave, no abdominal bruit and no mass. There is no hepatosplenomegaly. There is no tenderness. There is no rebound, no guarding and no CVA tenderness. No hernia.  Lymphadenopathy:    She has no cervical adenopathy.    She has no axillary adenopathy.  Neurological: She is alert. She has normal strength. No cranial nerve deficit or sensory deficit.  Skin: Skin is warm, dry and intact. No rash noted.  Psychiatric: Her speech is normal and behavior is normal. Judgment normal. Her mood appears not anxious. Cognition and memory are normal. She does not exhibit a depressed mood.          Assessment & Plan:

## 2017-08-31 NOTE — Patient Instructions (Addendum)
Please stop at the lab to have labs drawn.  keep u[ with the healthy eating and regular exercise!

## 2017-08-31 NOTE — Assessment & Plan Note (Signed)
Due for re-eval. 

## 2017-08-31 NOTE — Assessment & Plan Note (Signed)
Not  enough to warrant mediation. Followed by Podiatry.

## 2017-08-31 NOTE — Assessment & Plan Note (Signed)
Using atenolol as needed.. Has not used in past year.

## 2017-08-31 NOTE — Assessment & Plan Note (Signed)
Followed by Dr. Vira Agar.

## 2017-09-01 ENCOUNTER — Encounter: Payer: Self-pay | Admitting: Family Medicine

## 2017-09-25 ENCOUNTER — Other Ambulatory Visit: Payer: Self-pay | Admitting: Nurse Practitioner

## 2017-09-25 DIAGNOSIS — R1084 Generalized abdominal pain: Secondary | ICD-10-CM

## 2017-09-29 ENCOUNTER — Other Ambulatory Visit: Payer: Medicare Other

## 2017-10-10 LAB — HM COLONOSCOPY

## 2017-12-05 ENCOUNTER — Encounter: Payer: Self-pay | Admitting: Family Medicine

## 2017-12-05 ENCOUNTER — Ambulatory Visit (INDEPENDENT_AMBULATORY_CARE_PROVIDER_SITE_OTHER): Payer: Medicare Other | Admitting: Family Medicine

## 2017-12-05 VITALS — BP 90/62 | HR 74 | Temp 98.1°F | Ht 66.0 in | Wt 117.0 lb

## 2017-12-05 DIAGNOSIS — L03031 Cellulitis of right toe: Secondary | ICD-10-CM | POA: Diagnosis not present

## 2017-12-05 MED ORDER — CEPHALEXIN 500 MG PO CAPS
500.0000 mg | ORAL_CAPSULE | Freq: Three times a day (TID) | ORAL | 0 refills | Status: DC
Start: 2017-12-05 — End: 2017-12-14

## 2017-12-05 NOTE — Progress Notes (Signed)
   Subjective:    Patient ID: Jessica Miles, female    DOB: 08/08/1945, 72 y.o.   MRN: [id number]  HPI   72 year old female pt presents with a red and swollen right great toe.   Noted pain in right great toe lower nail edge after a hike.  No known injury, no trauma, shoes fit well.  Noted redness and soreness at edge of right great toenail. Worsening   Has tried OTC pain meds, ice, topical mupirocin cream, steroid cream... No change.   Pain greatest when shoe off and at night.   Social History /Family History/Past Medical History reviewed in detail and updated in EMR if needed. Blood pressure 90/62, pulse 74, temperature 98.1 F (36.7 C), temperature source Oral, height 5\' 6"  (1.676 m), weight 117 lb (53.1 kg).  Review of Systems  Constitutional: Negative for fatigue and fever.  HENT: Negative for congestion.   Eyes: Negative for pain.  Respiratory: Negative for cough and shortness of breath.   Cardiovascular: Negative for chest pain, palpitations and leg swelling.  Gastrointestinal: Negative for abdominal pain.  Genitourinary: Negative for dysuria and vaginal bleeding.  Musculoskeletal: Negative for back pain.  Neurological: Negative for syncope, light-headedness and headaches.  Psychiatric/Behavioral: Negative for dysphoric mood.       Objective:   Physical Exam  Constitutional: Vital signs are normal. She appears well-developed and well-nourished. She is cooperative.  Non-toxic appearance. She does not appear ill. No distress.  HENT:  Head: Normocephalic.  Right Ear: Hearing, tympanic membrane, external ear and ear canal normal. Tympanic membrane is not erythematous, not retracted and not bulging.  Left Ear: Hearing, tympanic membrane, external ear and ear canal normal. Tympanic membrane is not erythematous, not retracted and not bulging.  Nose: No mucosal edema or rhinorrhea. Right sinus exhibits no maxillary sinus tenderness and no frontal sinus tenderness. Left sinus  exhibits no maxillary sinus tenderness and no frontal sinus tenderness.  Mouth/Throat: Uvula is midline, oropharynx is clear and moist and mucous membranes are normal.  Eyes: Pupils are equal, round, and reactive to light. Conjunctivae, EOM and lids are normal. Lids are everted and swept, no foreign bodies found.  Neck: Trachea normal and normal range of motion. Neck supple. Carotid bruit is not present. No thyroid mass and no thyromegaly present.  Cardiovascular: Normal rate, regular rhythm, S1 normal, S2 normal, normal heart sounds, intact distal pulses and normal pulses. Exam reveals no gallop and no friction rub.  No murmur heard. Pulmonary/Chest: Effort normal and breath sounds normal. No tachypnea. No respiratory distress. She has no decreased breath sounds. She has no wheezes. She has no rhonchi. She has no rales.  Abdominal: Soft. Normal appearance and bowel sounds are normal. There is no tenderness.  Neurological: She is alert.  Skin: Skin is warm, dry and intact. No rash noted.  Right great toe base on medial nailbed erythematous, no fluctuance,lower medial nail edge painful  discoloration of nail lateral to  Tender nail bed, not tender  Psychiatric: Her speech is normal and behavior is normal. Judgment and thought content normal. Her mood appears not anxious. Cognition and memory are normal. She does not exhibit a depressed mood.          Assessment & Plan:

## 2017-12-05 NOTE — Assessment & Plan Note (Signed)
No clear fluctuance or area where I and D absolutely needed ( pt very hesitant about any I and D)  will treat with warm soaks and oral antibiotics, but not  worsening will need I and D   no DM

## 2017-12-05 NOTE — Patient Instructions (Signed)
Start warm soak/compresses 3-4 times daily.  While on antibiotics use probiotic.  Complete antibiotics.  Call or go to urgent care if redness spreading up foot, fever.

## 2017-12-14 ENCOUNTER — Encounter: Payer: Self-pay | Admitting: Podiatry

## 2017-12-14 ENCOUNTER — Ambulatory Visit (INDEPENDENT_AMBULATORY_CARE_PROVIDER_SITE_OTHER): Payer: Self-pay | Admitting: Podiatry

## 2017-12-14 DIAGNOSIS — Q828 Other specified congenital malformations of skin: Secondary | ICD-10-CM

## 2017-12-14 DIAGNOSIS — M7750 Other enthesopathy of unspecified foot: Secondary | ICD-10-CM

## 2017-12-14 NOTE — Progress Notes (Signed)
She presents today states that I am getting pain across the top of my ankles and the base of my toenail hallux right is painful.  She states that she got antibiotics from her primary care provider but did not take them because she believes in more of a holistic approach.  Objective: Vital signs are stable she is alert and oriented x3.  She has tenderness on palpation of her extensor retinaculum anterior ankles bilaterally equal and symmetrical.  Pulses remain palpable hallux nail plate right does demonstrate an injury.  There is some mild erythema surrounding the toe.  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  Also extensor tendinitis secondary to increased dorsiflexion due to hiking and increasing her treadmill angle.  Plan: I explained to her that she needs to keep the treadmill flat.  Also explained to her that she needs to take her antibiotics and soak the toe in Epsom salts and warm water.  She will continue the new remedy for the neuropathy.

## 2017-12-19 ENCOUNTER — Ambulatory Visit (INDEPENDENT_AMBULATORY_CARE_PROVIDER_SITE_OTHER): Payer: Medicare Other | Admitting: Family Medicine

## 2017-12-19 ENCOUNTER — Encounter: Payer: Self-pay | Admitting: Family Medicine

## 2017-12-19 VITALS — BP 100/70 | HR 78 | Temp 97.3°F | Ht 66.0 in | Wt 116.5 lb

## 2017-12-19 DIAGNOSIS — E538 Deficiency of other specified B group vitamins: Secondary | ICD-10-CM

## 2017-12-19 DIAGNOSIS — E559 Vitamin D deficiency, unspecified: Secondary | ICD-10-CM

## 2017-12-19 DIAGNOSIS — W57XXXA Bitten or stung by nonvenomous insect and other nonvenomous arthropods, initial encounter: Secondary | ICD-10-CM

## 2017-12-19 DIAGNOSIS — R5383 Other fatigue: Secondary | ICD-10-CM | POA: Diagnosis not present

## 2017-12-19 DIAGNOSIS — M545 Low back pain, unspecified: Secondary | ICD-10-CM | POA: Insufficient documentation

## 2017-12-19 DIAGNOSIS — M1812 Unilateral primary osteoarthritis of first carpometacarpal joint, left hand: Secondary | ICD-10-CM

## 2017-12-19 DIAGNOSIS — T148XXA Other injury of unspecified body region, initial encounter: Secondary | ICD-10-CM

## 2017-12-19 LAB — CBC WITH DIFFERENTIAL/PLATELET
BASOS PCT: 0.9 % (ref 0.0–3.0)
Basophils Absolute: 0.1 10*3/uL (ref 0.0–0.1)
EOS ABS: 0.1 10*3/uL (ref 0.0–0.7)
EOS PCT: 2 % (ref 0.0–5.0)
HEMATOCRIT: 42.4 % (ref 36.0–46.0)
HEMOGLOBIN: 14.2 g/dL (ref 12.0–15.0)
LYMPHS PCT: 33.2 % (ref 12.0–46.0)
Lymphs Abs: 2 10*3/uL (ref 0.7–4.0)
MCHC: 33.6 g/dL (ref 30.0–36.0)
MCV: 91.7 fl (ref 78.0–100.0)
Monocytes Absolute: 0.5 10*3/uL (ref 0.1–1.0)
Monocytes Relative: 8.8 % (ref 3.0–12.0)
NEUTROS ABS: 3.3 10*3/uL (ref 1.4–7.7)
Neutrophils Relative %: 55.1 % (ref 43.0–77.0)
PLATELETS: 235 10*3/uL (ref 150.0–400.0)
RBC: 4.62 Mil/uL (ref 3.87–5.11)
RDW: 12.7 % (ref 11.5–15.5)
WBC: 6 10*3/uL (ref 4.0–10.5)

## 2017-12-19 LAB — COMPREHENSIVE METABOLIC PANEL
ALT: 21 U/L (ref 0–35)
AST: 28 U/L (ref 0–37)
Albumin: 4.5 g/dL (ref 3.5–5.2)
Alkaline Phosphatase: 63 U/L (ref 39–117)
BUN: 22 mg/dL (ref 6–23)
CHLORIDE: 103 meq/L (ref 96–112)
CO2: 29 meq/L (ref 19–32)
Calcium: 9.5 mg/dL (ref 8.4–10.5)
Creatinine, Ser: 1.1 mg/dL (ref 0.40–1.20)
GFR: 51.62 mL/min — AB (ref >60.00)
GLUCOSE: 96 mg/dL (ref 70–99)
Potassium: 4.1 mEq/L (ref 3.5–5.1)
Sodium: 139 mEq/L (ref 135–145)
Total Bilirubin: 0.6 mg/dL (ref 0.2–1.2)
Total Protein: 7.3 g/dL (ref 6.0–8.3)

## 2017-12-19 LAB — TSH: TSH: 1.11 u[IU]/mL (ref 0.35–4.50)

## 2017-12-19 LAB — VITAMIN D 25 HYDROXY (VIT D DEFICIENCY, FRACTURES): VITD: 61.08 ng/mL (ref 30.00–100.00)

## 2017-12-19 LAB — T3, FREE: T3, Free: 2.8 pg/mL (ref 2.3–4.2)

## 2017-12-19 LAB — T4, FREE: FREE T4: 0.91 ng/dL (ref 0.60–1.60)

## 2017-12-19 LAB — VITAMIN B12: Vitamin B-12: >1525 pg/mL — ABNORMAL HIGH (ref 211–911)

## 2017-12-19 NOTE — Assessment & Plan Note (Signed)
Most consistent with SI joint tightness and pain. Start home stretches.

## 2017-12-19 NOTE — Assessment & Plan Note (Signed)
No clear sign of cardiopulmonary issue or new med SE.  Eval with labs.

## 2017-12-19 NOTE — Patient Instructions (Addendum)
Please stop at the lab to have labs drawn.  Start SI joint exercises.  call if not improving as exepected.

## 2017-12-19 NOTE — Progress Notes (Signed)
Subjective:    Patient ID: Jessica Miles, female    DOB: [birth year], 72 y.o.   MRN: [id number]  HPI    72 year old female with new onset growth on left thumb knuckle.   She noted new lesion  On top of left thumb DIP joint.   She cut thumb pad earlier in the year and had been using thumb differently to avoid presure on pad.   Pain in thumb  Tip as well,  But no swelling and no redness or pain in joint.  No decrease in motion.   She has tried a homemade splint which helps some.   She has also noted an area on pressure in low back,  Left lateral. She continues to exercise.  No numbness or weakness in legs.  She has been very tired in last several months.  Did have an episode of lightheadedness in the night when got up 6-8 weeks ago none since. No heart racing lately. No CP, no SOB. No bleeding. No new medications except B1 and glucosamine. Nml vit and thyroid eval at last 08/2017 labs.  Using atenolol as needed for SVT.  She hikes a lot and worries about possble lyme disease... Multiple ticks over the summer, no rash.  Blood pressure 100/70, pulse 78, temperature (!) 97.3 F (36.3 C), temperature source Oral, height 5\' 6"  (1.676 m), weight 116 lb 8 oz (52.8 kg).  Review of Systems  Constitutional: Positive for fatigue. Negative for fever.  HENT: Negative for congestion.   Eyes: Negative for pain.  Respiratory: Negative for cough and shortness of breath.   Cardiovascular: Negative for chest pain, palpitations and leg swelling.  Gastrointestinal: Negative for abdominal pain.  Genitourinary: Negative for dysuria and vaginal bleeding.  Musculoskeletal: Negative for back pain.  Neurological: Negative for syncope, light-headedness and headaches.  Psychiatric/Behavioral: Negative for dysphoric mood.       Objective:   Physical Exam  Constitutional: Vital signs are normal. She appears well-developed and well-nourished. She is cooperative.  Non-toxic appearance. She does not appear  ill. No distress.  HENT:  Head: Normocephalic.  Right Ear: Hearing, tympanic membrane, external ear and ear canal normal. Tympanic membrane is not erythematous, not retracted and not bulging.  Left Ear: Hearing, tympanic membrane, external ear and ear canal normal. Tympanic membrane is not erythematous, not retracted and not bulging.  Nose: No mucosal edema or rhinorrhea. Right sinus exhibits no maxillary sinus tenderness and no frontal sinus tenderness. Left sinus exhibits no maxillary sinus tenderness and no frontal sinus tenderness.  Mouth/Throat: Uvula is midline, oropharynx is clear and moist and mucous membranes are normal.  Eyes: Pupils are equal, round, and reactive to light. Conjunctivae, EOM and lids are normal. Lids are everted and swept, no foreign bodies found.  Neck: Trachea normal and normal range of motion. Neck supple. Carotid bruit is not present. No thyroid mass and no thyromegaly present.  Cardiovascular: Normal rate, regular rhythm, S1 normal, S2 normal, normal heart sounds, intact distal pulses and normal pulses. Exam reveals no gallop and no friction rub.  No murmur heard. Pulmonary/Chest: Effort normal and breath sounds normal. No tachypnea. No respiratory distress. She has no decreased breath sounds. She has no wheezes. She has no rhonchi. She has no rales.  Abdominal: Soft. Normal appearance and bowel sounds are normal. There is no tenderness.  Musculoskeletal:       Lumbar back: She exhibits decreased range of motion and tenderness. She exhibits no bony tenderness.  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  Skin: Skin is warm, dry and intact. No rash noted.  Psychiatric: Her speech is normal and behavior is normal. Judgment and thought content normal. Her mood appears not anxious. Cognition and memory are normal. She does not exhibit a depressed mood.           Assessment & Plan:

## 2017-12-19 NOTE — Addendum Note (Signed)
Addended by: Ellamae Sia on: 12/19/2017 12:11 PM   Modules accepted: Orders

## 2017-12-19 NOTE — Assessment & Plan Note (Signed)
Heberden's nodule.. Likely due to compensation and change in use.

## 2017-12-21 LAB — B. BURGDORFI ANTIBODIES BY WB
B burgdorferi IgG Abs (IB): NEGATIVE
B burgdorferi IgM Abs (IB): NEGATIVE
LYME DISEASE 23 KD IGM: NONREACTIVE
LYME DISEASE 39 KD IGM: NONREACTIVE
LYME DISEASE 41 KD IGM: NONREACTIVE
Lyme Disease 18 kD IgG: NONREACTIVE
Lyme Disease 23 kD IgG: REACTIVE — AB
Lyme Disease 28 kD IgG: NONREACTIVE
Lyme Disease 30 kD IgG: NONREACTIVE
Lyme Disease 39 kD IgG: NONREACTIVE
Lyme Disease 41 kD IgG: REACTIVE — AB
Lyme Disease 45 kD IgG: NONREACTIVE
Lyme Disease 58 kD IgG: NONREACTIVE
Lyme Disease 66 kD IgG: NONREACTIVE
Lyme Disease 93 kD IgG: REACTIVE — AB

## 2018-01-09 ENCOUNTER — Ambulatory Visit (INDEPENDENT_AMBULATORY_CARE_PROVIDER_SITE_OTHER): Payer: Medicare Other | Admitting: Orthotics

## 2018-01-09 DIAGNOSIS — M7750 Other enthesopathy of unspecified foot: Secondary | ICD-10-CM

## 2018-01-09 DIAGNOSIS — M779 Enthesopathy, unspecified: Secondary | ICD-10-CM

## 2018-01-09 DIAGNOSIS — M778 Other enthesopathies, not elsewhere classified: Secondary | ICD-10-CM

## 2018-01-09 DIAGNOSIS — Q828 Other specified congenital malformations of skin: Secondary | ICD-10-CM

## 2018-01-09 NOTE — Progress Notes (Signed)
Patient came in today to pick up custom made foot orthotics.  The goals were accomplished and the patient reported no dissatisfaction with said orthotics.  Patient was advised of breakin period and how to report any issues. 

## 2018-06-19 ENCOUNTER — Telehealth: Payer: Self-pay | Admitting: Family Medicine

## 2018-06-19 DIAGNOSIS — M8580 Other specified disorders of bone density and structure, unspecified site: Secondary | ICD-10-CM

## 2018-06-19 DIAGNOSIS — Z13220 Encounter for screening for lipoid disorders: Secondary | ICD-10-CM

## 2018-06-19 DIAGNOSIS — E039 Hypothyroidism, unspecified: Secondary | ICD-10-CM

## 2018-06-19 NOTE — Telephone Encounter (Signed)
-----   Message from Cloyd Stagers, RT sent at 06/18/2018 10:33 AM EDT ----- Regarding: Lab Orders for Wednesday 6.10.2020 Please place lab orders for Wednesday 6.10.2020 Thank you, Dyke Maes RT(R)

## 2018-06-20 ENCOUNTER — Other Ambulatory Visit (INDEPENDENT_AMBULATORY_CARE_PROVIDER_SITE_OTHER): Payer: Medicare Other

## 2018-06-20 ENCOUNTER — Other Ambulatory Visit: Payer: Self-pay

## 2018-06-20 DIAGNOSIS — E039 Hypothyroidism, unspecified: Secondary | ICD-10-CM

## 2018-06-20 DIAGNOSIS — Z13220 Encounter for screening for lipoid disorders: Secondary | ICD-10-CM

## 2018-06-20 LAB — T4, FREE: Free T4: 1.09 ng/dL (ref 0.60–1.60)

## 2018-06-20 LAB — COMPREHENSIVE METABOLIC PANEL
ALT: 17 U/L (ref 0–35)
AST: 22 U/L (ref 0–37)
Albumin: 4.1 g/dL (ref 3.5–5.2)
Alkaline Phosphatase: 77 U/L (ref 39–117)
BUN: 26 mg/dL — ABNORMAL HIGH (ref 6–23)
CO2: 29 mEq/L (ref 19–32)
Calcium: 9 mg/dL (ref 8.4–10.5)
Chloride: 103 mEq/L (ref 96–112)
Creatinine, Ser: 1.08 mg/dL (ref 0.40–1.20)
GFR: 49.54 mL/min — ABNORMAL LOW (ref >60.00)
Glucose, Bld: 93 mg/dL (ref 70–99)
Potassium: 4.3 mEq/L (ref 3.5–5.1)
Sodium: 139 mEq/L (ref 135–145)
Total Bilirubin: 0.5 mg/dL (ref 0.2–1.2)
Total Protein: 6.2 g/dL (ref 6.0–8.3)

## 2018-06-20 LAB — LIPID PANEL
Cholesterol: 195 mg/dL (ref 0–200)
HDL: 73.7 mg/dL (ref >39.00)
LDL Cholesterol: 110 mg/dL — ABNORMAL HIGH (ref 0–99)
NonHDL: 120.92
Total CHOL/HDL Ratio: 3
Triglycerides: 54 mg/dL (ref 0.0–149.0)
VLDL: 10.8 mg/dL (ref 0.0–40.0)

## 2018-06-20 LAB — T3, FREE: T3, Free: 2.5 pg/mL (ref 2.3–4.2)

## 2018-06-20 LAB — TSH: TSH: 0.16 u[IU]/mL — ABNORMAL LOW (ref 0.35–4.50)

## 2018-07-10 MED ORDER — LEVOTHYROXINE SODIUM 100 MCG PO TABS: 100.0000 ug | ORAL_TABLET | Freq: Every day | ORAL | 3 refills | Status: DC

## 2018-07-10 MED ORDER — BUPROPION HCL ER (SR) 100 MG PO TB12: 100.0000 mg | ORAL_TABLET | Freq: Two times a day (BID) | ORAL | 0 refills | Status: DC

## 2018-08-30 ENCOUNTER — Other Ambulatory Visit: Payer: Self-pay | Admitting: Family Medicine

## 2018-08-30 NOTE — Telephone Encounter (Signed)
Left message asking pt to call office  °

## 2018-08-30 NOTE — Telephone Encounter (Signed)
LOV 12/19/2017. No future appointments. When should patient follow up?

## 2018-08-30 NOTE — Telephone Encounter (Signed)
Robin, please schedule medicare wellness and CPX.   Must schedule appointment beforet his medication can be refilled.  Please send back to [facility] Penny once appointment is made for her to refill until appt. Thanks!

## 2018-09-05 ENCOUNTER — Encounter: Payer: Self-pay | Admitting: Family Medicine

## 2018-09-05 NOTE — Telephone Encounter (Signed)
Tried calling pt no answer mailed letter

## 2018-09-11 NOTE — Telephone Encounter (Signed)
Labs 9/15 [id number]

## 2018-09-24 ENCOUNTER — Telehealth: Payer: Self-pay | Admitting: Family Medicine

## 2018-09-24 DIAGNOSIS — Z13220 Encounter for screening for lipoid disorders: Secondary | ICD-10-CM

## 2018-09-24 DIAGNOSIS — E039 Hypothyroidism, unspecified: Secondary | ICD-10-CM

## 2018-09-24 NOTE — Telephone Encounter (Signed)
-----   Message from Ellamae Sia sent at 09/18/2018  9:59 AM EDT ----- Regarding: Lab orders for Tuesday, 9.15.20 Patient is scheduled for CPX labs, please order future labs, Thanks , Karna Christmas

## 2018-09-25 ENCOUNTER — Other Ambulatory Visit (INDEPENDENT_AMBULATORY_CARE_PROVIDER_SITE_OTHER): Payer: Medicare Other

## 2018-09-25 DIAGNOSIS — E039 Hypothyroidism, unspecified: Secondary | ICD-10-CM

## 2018-09-25 DIAGNOSIS — Z13220 Encounter for screening for lipoid disorders: Secondary | ICD-10-CM

## 2018-09-25 LAB — T4, FREE: Free T4: 1.04 ng/dL (ref 0.60–1.60)

## 2018-09-25 LAB — COMPREHENSIVE METABOLIC PANEL
ALT: 19 U/L (ref 0–35)
AST: 24 U/L (ref 0–37)
Albumin: 4.2 g/dL (ref 3.5–5.2)
Alkaline Phosphatase: 69 U/L (ref 39–117)
BUN: 22 mg/dL (ref 6–23)
CO2: 30 mEq/L (ref 19–32)
Calcium: 9.6 mg/dL (ref 8.4–10.5)
Chloride: 103 mEq/L (ref 96–112)
Creatinine, Ser: 1.04 mg/dL (ref 0.40–1.20)
GFR: 51.71 mL/min — ABNORMAL LOW (ref >60.00)
Glucose, Bld: 94 mg/dL (ref 70–99)
Potassium: 4.4 mEq/L (ref 3.5–5.1)
Sodium: 141 mEq/L (ref 135–145)
Total Bilirubin: 0.7 mg/dL (ref 0.2–1.2)
Total Protein: 6.6 g/dL (ref 6.0–8.3)

## 2018-09-25 LAB — LIPID PANEL
Cholesterol: 212 mg/dL — ABNORMAL HIGH (ref 0–200)
HDL: 89.2 mg/dL (ref >39.00)
LDL Cholesterol: 112 mg/dL — ABNORMAL HIGH (ref 0–99)
NonHDL: 123.04
Total CHOL/HDL Ratio: 2
Triglycerides: 56 mg/dL (ref 0.0–149.0)
VLDL: 11.2 mg/dL (ref 0.0–40.0)

## 2018-09-25 LAB — T3, FREE: T3, Free: 2.7 pg/mL (ref 2.3–4.2)

## 2018-09-25 LAB — TSH: TSH: 0.4 u[IU]/mL (ref 0.35–4.50)

## 2018-09-25 NOTE — Progress Notes (Signed)
No critical labs need to be addressed urgently. We will discuss labs in detail at upcoming office visit.   

## 2018-10-02 ENCOUNTER — Ambulatory Visit (INDEPENDENT_AMBULATORY_CARE_PROVIDER_SITE_OTHER): Payer: Medicare Other | Admitting: Family Medicine

## 2018-10-02 ENCOUNTER — Other Ambulatory Visit: Payer: Self-pay

## 2018-10-02 VITALS — BP 110/60 | HR 94 | Temp 98.7°F | Ht 66.0 in | Wt 112.8 lb

## 2018-10-02 DIAGNOSIS — F331 Major depressive disorder, recurrent, moderate: Secondary | ICD-10-CM | POA: Diagnosis not present

## 2018-10-02 DIAGNOSIS — Z0000 Encounter for general adult medical examination without abnormal findings: Secondary | ICD-10-CM

## 2018-10-02 DIAGNOSIS — E039 Hypothyroidism, unspecified: Secondary | ICD-10-CM | POA: Diagnosis not present

## 2018-10-02 DIAGNOSIS — Z23 Encounter for immunization: Secondary | ICD-10-CM | POA: Diagnosis not present

## 2018-10-02 MED ORDER — BUPROPION HCL ER (SR) 100 MG PO TB12: 100.0000 mg | ORAL_TABLET | Freq: Two times a day (BID) | ORAL | 1 refills | Status: DC

## 2018-10-02 NOTE — Assessment & Plan Note (Signed)
Well controlled. Continue current medication.  

## 2018-10-02 NOTE — Patient Instructions (Addendum)
Work on decreasing peanuts and cookies in diet.  Call if interested in cholesterol re-check in 3-6 months.

## 2018-10-02 NOTE — Progress Notes (Signed)
Chief Complaint  Patient presents with  . Medicare Wellness    History of Present Illness: HPI  The patient presents for annual medicare wellness, complete physical and review of chronic health problems. He/She also has the following acute concerns today:none  I have personally reviewed the Medicare Annual Wellness questionnaire and have noted 1. The patient's medical and social history 2. Their use of alcohol, tobacco or illicit drugs 3. Their current medications and supplements 4. The patient's functional ability including ADL's, fall risks, home safety risks and hearing or visual             impairment. 5. Diet and physical activities 6. Evidence for depression or mood disorders 7.         Updated provider list Cognitive evaluation was performed and recorded on pt medicare questionnaire form. The patients weight, height, BMI and visual acuity have been recorded in the chart  I have made referrals, counseling and provided education to the patient based review of the above and I have provided the pt with a written personalized care plan for preventive services.   Documentation of this information was scanned into the electronic record under the media tab.   Advance directives and end of life planning reviewed in detail with patient and documented in EMR. Patient given handout on advance care directives if needed. HCPOA and living will updated if needed.  Fall Risk  10/02/2018 08/31/2017 09/27/2016 11/11/2015 08/27/2015  Falls in the past year? 0 No No Yes No  Number falls in past yr: - - - 1 -  Injury with Fall? - - - Yes -  Comment - - - fx foot -  Risk for fall due to : - - - Other (Comment) -  Risk for fall due to: Comment - - - fell chasing animal -    Hearing Screening   Method: Audiometry   125Hz  250Hz  500Hz  1000Hz  2000Hz  3000Hz  4000Hz  6000Hz  8000Hz   Right ear:   25 20 20  20     Left ear:   20 40 40  25    Vision Screening Comments: Wears glasses. Eye exam scheduled for  01/15/2019 with Dr. Wallace Going at [facility]   MDD: well controlled on wellbutrin Depression screen [facility] 2/9 10/02/2018 08/31/2017 09/27/2016  Decreased Interest 0 0 0  Down, Depressed, Hopeless 0 0 0  PHQ - 2 Score 0 0 0   Hypothyroid  Lab Results  Component Value Date   TSH 0.40 09/25/2018    Cholesterol has trended up over time. Lab Results  Component Value Date   CHOL 212 (H) 09/25/2018   HDL 89.20 09/25/2018   LDLCALC 112 (H) 09/25/2018   TRIG 56.0 09/25/2018   CHOLHDL 2 09/25/2018   Diet: healthy   Exercise: walking 5 miles daily. 30 mile biking weekly  She had an episode once of near syncope when waking up in night going to bathroom. No associated symptoms.  No SOB, no chest pain.  Occ has balance issues in AM in dark when moving head to side and back.   Patient Care Team: Jinny Sanders, MD as PCP - General (Family Medicine) Garrel Ridgel, DPM as Consulting Physician (Podiatry) Leta Baptist, Earlean Polka, MD as Consulting Physician (Neurology)   COVID 19 screen No recent travel or known exposure to [place] The patient denies respiratory symptoms of COVID 19 at this time.  The importance of social distancing was discussed today.   ROS    Past Medical History:  Diagnosis Date  .  Anxiety   . History of IBS   . HPV (human papilloma virus) infection   . Loss of taste   . Neuropathy 2015   surgery for Mortons neuroma in 1985  . Osteopenia   . Tachycardia   . Tachycardia   . Thyroid disease     reports that she has never smoked. She has never used smokeless tobacco. She reports current alcohol use. She reports that she does not use drugs.   Current Outpatient Medications:  .  ascorbic acid (VITAMIN C) 500 MG tablet, Take 500 mg by mouth 2 (two) times daily. , Disp: , Rfl:  .  atenolol (TENORMIN) 25 MG tablet, Take 25 mg by mouth daily as needed., Disp: , Rfl:  .  buPROPion (WELLBUTRIN SR) 100 MG 12 hr tablet, TAKE 1 TABLET TWICE A DAY, Disp: 180 tablet, Rfl: 0 .   Calcium Carb-Cholecalciferol (CALCIUM-VITAMIN D) 600-400 MG-UNIT TABS, Take 1 tablet by mouth 2 (two) times daily., Disp: , Rfl:  .  cyanocobalamin 2000 MCG tablet, Take 2,000 mcg by mouth daily., Disp: , Rfl:  .  levothyroxine (SYNTHROID) 100 MCG tablet, Take 1 tablet (100 mcg total) by mouth daily., Disp: 90 tablet, Rfl: 3   Observations/Objective: Blood pressure 110/60, pulse 94, temperature 98.7 F (37.1 C), height 5\' 6"  (1.676 m), weight 112 lb 12 oz (51.1 kg), SpO2 97 %.  Physical Exam Constitutional:      General: She is not in acute distress.    Appearance: Normal appearance. She is well-developed. She is not ill-appearing or toxic-appearing.  HENT:     Head: Normocephalic.     Right Ear: Hearing, tympanic membrane, ear canal and external ear normal.     Left Ear: Hearing, tympanic membrane, ear canal and external ear normal.     Nose: Nose normal.  Eyes:     General: Lids are normal. Lids are everted, no foreign bodies appreciated.     Conjunctiva/sclera: Conjunctivae normal.     Pupils: Pupils are equal, round, and reactive to light.  Neck:     Musculoskeletal: Normal range of motion and neck supple.     Thyroid: No thyroid mass or thyromegaly.     Vascular: No carotid bruit.     Trachea: Trachea normal.  Cardiovascular:     Rate and Rhythm: Normal rate and regular rhythm.     Heart sounds: Normal heart sounds, S1 normal and S2 normal. No murmur. No gallop.   Pulmonary:     Effort: Pulmonary effort is normal. No respiratory distress.     Breath sounds: Normal breath sounds. No wheezing, rhonchi or rales.  Abdominal:     General: Bowel sounds are normal. There is no distension or abdominal bruit.     Palpations: Abdomen is soft. There is no fluid wave or mass.     Tenderness: There is no abdominal tenderness. There is no guarding or rebound.     Hernia: No hernia is present.  Lymphadenopathy:     Cervical: No cervical adenopathy.  Skin:    General: Skin is warm and  dry.     Findings: No rash.  Neurological:     Mental Status: She is alert.     Cranial Nerves: No cranial nerve deficit.     Sensory: No sensory deficit.  Psychiatric:        Mood and Affect: Mood is not anxious or depressed.        Speech: Speech normal.  Behavior: Behavior normal. Behavior is cooperative.        Judgment: Judgment normal.      Assessment and Plan   The patient's preventative maintenance and recommended screening tests for an annual wellness exam were reviewed in full today. Brought up to date unless services declined.  Counselled on the importance of diet, exercise, and its role in overall health and mortality. The patient's FH and SH was reviewed, including their home life, tobacco status, and drug and alcohol status.   Vaccines: uptodate with tdap, given flu today Pap/DVE: not indicated Mammo: 2018 nml Bone Density: 2015 osteopenia... she feels she had another one. Colon:2019, no further indicated  Smoking Status:none ETOH/ drug [id number]  Hep C: neg     Eliezer Lofts, MD

## 2018-11-30 ENCOUNTER — Telehealth: Payer: Self-pay

## 2018-11-30 ENCOUNTER — Encounter: Payer: Self-pay | Admitting: Emergency Medicine

## 2018-11-30 ENCOUNTER — Ambulatory Visit
Admission: EM | Admit: 2018-11-30 | Discharge: 2018-11-30 | Disposition: A | Discharge status: Home / Self Care | Payer: Medicare Other | Attending: Emergency Medicine | Admitting: Emergency Medicine

## 2018-11-30 ENCOUNTER — Other Ambulatory Visit: Payer: Self-pay

## 2018-11-30 DIAGNOSIS — J0100 Acute maxillary sinusitis, unspecified: Secondary | ICD-10-CM | POA: Insufficient documentation

## 2018-11-30 DIAGNOSIS — J320 Chronic maxillary sinusitis: Secondary | ICD-10-CM

## 2018-11-30 DIAGNOSIS — R030 Elevated blood-pressure reading, without diagnosis of hypertension: Secondary | ICD-10-CM | POA: Insufficient documentation

## 2018-11-30 LAB — POCT RAPID STREP A (OFFICE): Rapid Strep A Screen: NEGATIVE

## 2018-11-30 MED ORDER — AMOXICILLIN 500 MG PO TABS: 500.0000 mg | ORAL_TABLET | Freq: Three times a day (TID) | ORAL | 0 refills | Status: DC

## 2018-11-30 NOTE — Telephone Encounter (Signed)
I spoke with Dr Diona Browner and with pt symptoms she cannot come into office for visit; recommend UC but if pt refuses then can schedule virtual appt with Dr Diona Browner. Pt said she agrees that she needs a face to face visit and she will go to either [facility] in [place] or [facility] in [place]. Pt very appreciative. FYI to Dr Diona Browner.

## 2018-11-30 NOTE — ED Triage Notes (Signed)
Patient in office today c/o sinus,ear pain sorethroat x 11/09/2018   Denies:fever,chills,ha  [id number], allegra

## 2018-11-30 NOTE — Telephone Encounter (Signed)
Noted  

## 2018-11-30 NOTE — ED Provider Notes (Signed)
[facility]    CSN: [id number] Arrival date & time: 11/30/18  1007      History   Chief Complaint Chief Complaint  Patient presents with   Sore Throat   Sinus Problem    HPI Jenalynn Iler is a 73 y.o. female.   Patient presents with sinus congestion, postnasal drip, sore throat, earache x3 weeks.  She has attempted treatment at home with a Nettie pot, nasal saline, Allegra without relief.  She denies fever, chills, rash, shortness of breath, vomiting, diarrhea, or other symptoms.  The history is provided by the patient.    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   Fatigue 12/19/2017   Acute left-sided low back pain without sciatica 12/19/2017   Infection of nail bed of toe of right foot 12/05/2017   SVT (supraventricular tachycardia) ([place]) 08/31/2017   Peripheral neuropathy 08/31/2017   Medicare annual wellness visit, subsequent 09/27/2016   Dry eyes 09/21/2015   Atopic dermatitis 03/13/2015   IBS (irritable bowel syndrome) 03/18/2014   Osteopenia 03/18/2014   Major depressive disorder, recurrent episode, moderate ([place]) 03/18/2014   Hypothyroidism 12/14/2013    Past Surgical History:  Procedure Laterality Date   FOOT NEUROMA SURGERY  1985   WISDOM TOOTH EXTRACTION      OB History   No obstetric history on file.      Home Medications    Prior to Admission medications   Medication Sig Start Date End Date Taking? Authorizing Provider  ascorbic acid (VITAMIN C) 500 MG tablet Take 500 mg by mouth 2 (two) times daily.    Yes [provider]  atenolol (TENORMIN) 25 MG tablet Take 25 mg by mouth daily as needed.    Yes [provider]  buPROPion (WELLBUTRIN SR) 100 MG 12 hr tablet Take 1 tablet (100 mg total) by mouth 2 (two) times daily. 10/02/18  Yes Bedsole, Amy E, MD  Calcium Carb-Cholecalciferol (CALCIUM-VITAMIN D) 600-400 MG-UNIT TABS Take 1 tablet by mouth 2 (two) times daily.   Yes [provider]  cyanocobalamin 2000 MCG tablet Take 2,000 mcg by mouth daily.   Yes [provider]  levothyroxine (SYNTHROID) 100 MCG tablet Take 1 tablet (100 mcg total) by mouth daily. 07/10/18  Yes Bedsole, Amy E, MD  amoxicillin (AMOXIL) 500 MG tablet Take 1 tablet (500 mg total) by mouth 3 (three) times daily. 11/30/18   Sharion Balloon, NP    Family History Family History  Problem Relation Age of Onset   Heart failure Mother    Heart attack Mother    Heart disease Mother    Heart attack Father    Heart disease Father    Heart attack Maternal Grandfather    Diabetes Son        IDDM   Heart disease Maternal Grandmother    Thyroid disease Neg Hx     Social History Social History   Tobacco Use   Smoking status: Never Smoker   Smokeless tobacco: Never Used  Substance Use Topics   Alcohol use: Yes    Comment: 2-3 glasses per  week   Drug use: No     Allergies   Fluticasone, Mometasone, and Fexofenadine   Review of Systems Review of Systems  Constitutional: Negative for chills and fever.  HENT: Positive for congestion, ear pain, postnasal drip and sore throat.   Eyes: Negative for pain and visual disturbance.  Respiratory: Negative for cough and shortness of breath.   Cardiovascular: Negative for chest pain and palpitations.  Gastrointestinal: Negative for abdominal pain, diarrhea, nausea and vomiting.  Genitourinary: Negative for dysuria and hematuria.  Musculoskeletal: Negative for arthralgias and back pain.  Skin: Negative for color change and rash.  Neurological: Negative for seizures and syncope.  All other systems reviewed and are  negative.    Physical Exam Triage Vital Signs ED Triage Vitals [11/30/18 1013]  Enc Vitals Group     BP (!) 160/75     Pulse Rate 83     Resp 18     Temp 98.1 F (36.7 C)     Temp Source Oral     SpO2 98 %     Weight      Height      Head Circumference      Peak Flow      Pain Score      Pain Loc      Pain Edu?      Excl. in [facility]?    No data found.  Updated Vital Signs BP (!) 160/75 (BP Location: Right Arm)    Pulse 83    Temp 98.1 F (36.7 C) (Oral)    Resp 18    Wt 114 lb (51.7 kg)    SpO2 98%    BMI 18.40 kg/m   Visual Acuity Right Eye Distance:   Left Eye Distance:   Bilateral Distance:    Right Eye Near:   Left Eye Near:    Bilateral Near:     Physical Exam Vitals signs and nursing note reviewed.  Constitutional:      General: She is not in acute distress.    Appearance: She is well-developed.  HENT:     Head: Normocephalic and atraumatic.     Right Ear: Tympanic membrane normal.     Left Ear: Tympanic membrane normal.     Nose: Congestion present.     Mouth/Throat:     Mouth: Mucous membranes are moist.     Pharynx: Posterior oropharyngeal erythema present. No oropharyngeal exudate.  Eyes:     Conjunctiva/sclera: Conjunctivae normal.  Neck:     Musculoskeletal: Neck supple.  Cardiovascular:     Rate and Rhythm: Normal rate and regular rhythm.     Heart sounds: No murmur.  Pulmonary:     Effort: Pulmonary effort is normal. No respiratory distress.     Breath sounds: Normal breath sounds. No wheezing or rhonchi.  Abdominal:     General: Bowel sounds are normal.     Palpations: Abdomen is soft.     Tenderness: There is no abdominal tenderness. There is no guarding or rebound.  Skin:    General: Skin is warm and dry.     Findings: No rash.  Neurological:     General: No focal deficit present.     Mental Status: She is alert and oriented to person, place, and time.  Psychiatric:        Mood and Affect: Mood normal.        Behavior: Behavior  normal.      UC Treatments / Results  Labs (all labs  ordered are listed, but only abnormal results are displayed) Labs Reviewed  NOVEL CORONAVIRUS, NAA  POCT RAPID STREP A (OFFICE)  POCT RAPID STREP A (OFFICE)    EKG   Radiology No results found.  Procedures Procedures (including critical care time)  Medications Ordered in UC Medications - No data to display  Initial Impression / Assessment and Plan / UC Course  I have reviewed the triage vital signs and the nursing notes.  Pertinent labs & imaging results that were available during my care of the patient were reviewed by me and considered in my medical decision making (see chart for details).   Acute nonrecurrent maxillary sinusitis.  Elevated blood pressure reading.  Rapid strep negative; throat culture pending.  Treating with amoxicillin.  Instructed patient to follow-up with her PCP if her symptoms or not improving.  Discussed with patient that her blood pressure is elevated today and needs to be rechecked by her PCP in 2 to 4 weeks.  Patient agrees to plan of care.  Final Clinical Impressions(s) / UC Diagnoses   Final diagnoses:  Acute non-recurrent maxillary sinusitis  Elevated blood pressure reading     Discharge Instructions     Your rapid strep test is negative.  A throat culture is pending; we will call you if it is positive requiring treatment.    Take the amoxicillin as directed.    Follow up with your primary care provider if your symptoms are not improving.   Your blood pressure is elevated today at 160/75.  Please have this rechecked by your primary care provider in 2 weeks.      ED Prescriptions    Medication Sig Dispense Auth. Provider   amoxicillin (AMOXIL) 500 MG tablet Take 1 tablet (500 mg total) by mouth 3 (three) times daily. 21 tablet Sharion Balloon, NP     PDMP not reviewed this encounter.   Sharion Balloon, NP 11/30/18 1041

## 2018-11-30 NOTE — Discharge Instructions (Addendum)
Your rapid strep test is negative.  A throat culture is pending; we will call you if it is positive requiring treatment.    Take the amoxicillin as directed.    Follow up with your primary care provider if your symptoms are not improving.   Your blood pressure is elevated today at 160/75.  Please have this rechecked by your primary care provider in 2 weeks.

## 2018-11-30 NOTE — Telephone Encounter (Signed)
For 2-3 wks pt has pressure in rt ear,pt can swallow but when swallows feels like there is pressure on trachea; now pressure on trachea is every time pt swallows. Pt has runny nose, S/T, post nasal drip, loss of taste and smell for 5 yrs,. Pt using netti pot,saline mist spray,saline wash x 2 and taking allegra. Pt is not going to UC or ED and wants to be seen in her doctors office. Pt request cb with appt. UC & ED precautions given and pt voiced understanding.Please advise.

## 2018-12-02 LAB — CULTURE, GROUP A STREP (THRC)

## 2018-12-02 LAB — NOVEL CORONAVIRUS, NAA: SARS-CoV-2, NAA: NOT DETECTED

## 2019-02-06 ENCOUNTER — Other Ambulatory Visit: Payer: Self-pay

## 2019-02-06 ENCOUNTER — Ambulatory Visit (INDEPENDENT_AMBULATORY_CARE_PROVIDER_SITE_OTHER): Payer: Medicare Other | Admitting: Podiatry

## 2019-02-06 ENCOUNTER — Encounter: Payer: Self-pay | Admitting: Podiatry

## 2019-02-06 ENCOUNTER — Ambulatory Visit: Payer: Medicare Other

## 2019-02-06 DIAGNOSIS — M778 Other enthesopathies, not elsewhere classified: Secondary | ICD-10-CM

## 2019-02-06 DIAGNOSIS — T691XXA Chilblains, initial encounter: Secondary | ICD-10-CM

## 2019-02-06 NOTE — Progress Notes (Signed)
She presents today with concerns about redness to the third and fourth toes of the right foot.  She states that she is an avid exercise enthusiast and she walks 5 miles every morning even when it is freezing cold outside.  She tried padding the toe and using arthritis creams which have not helped.  Objective: Vital signs are stable alert oriented x3 pulses are palpable right foot.  Toes #2 at the level of the DIPJ the distal lateral aspect of the fourth toe right and the lateral aspect of the third toe right demonstrates what appears to be mild erythema with small areas of vascular disruption consistent with chilblains.  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  Plan: Encouraged warm [place] salt warm and warm water soaks also encouraged her not to wear excessively thick socks and multiple socks while exercising for long periods of time we also discussed Castellani's paint to the lateral aspect of the third toe.  I will follow-up with her in a couple weeks if she has not improved.  We would need to also consider Covid toes.

## 2019-02-09 DIAGNOSIS — I7300 Raynaud's syndrome without gangrene: Secondary | ICD-10-CM | POA: Insufficient documentation

## 2019-03-04 ENCOUNTER — Encounter: Payer: Self-pay | Admitting: Family Medicine

## 2019-03-04 ENCOUNTER — Other Ambulatory Visit: Payer: Self-pay

## 2019-03-04 ENCOUNTER — Telehealth: Payer: Self-pay

## 2019-03-04 ENCOUNTER — Ambulatory Visit (INDEPENDENT_AMBULATORY_CARE_PROVIDER_SITE_OTHER): Payer: Medicare Other | Admitting: Family Medicine

## 2019-03-04 VITALS — BP 120/70 | HR 82 | Temp 97.7°F | Ht 66.0 in | Wt 114.5 lb

## 2019-03-04 DIAGNOSIS — I739 Peripheral vascular disease, unspecified: Secondary | ICD-10-CM

## 2019-03-04 DIAGNOSIS — M79673 Pain in unspecified foot: Secondary | ICD-10-CM

## 2019-03-04 DIAGNOSIS — R0989 Other specified symptoms and signs involving the circulatory and respiratory systems: Secondary | ICD-10-CM

## 2019-03-04 NOTE — Patient Instructions (Signed)
I think your shoes may be 1/2 size too small.

## 2019-03-04 NOTE — Progress Notes (Signed)
Jessica Malachi T. Yazeed Pryer, MD Primary Care and [place] at [facility] [place], [address] Phone: [phone]  FAX: [phone]  Jessica Miles - 74 y.o. female  MRN [id number]  Date of Birth: March 26, 1945  Visit Date: 03/04/2019  PCP: Jinny Sanders, MD  Referred by: Jinny Sanders, MD  Chief Complaint  Patient presents with  . Sore Between Toes    Bilateral    This visit occurred during the SARS-CoV-2 public health emergency.  Safety protocols were in place, including screening questions prior to the visit, additional usage of staff PPE, and extensive cleaning of exam room while observing appropriate contact time as indicated for disinfecting solutions.   Subjective:   Jessica Miles is a 74 y.o. very pleasant female patient who presents with the following:  She presents with some ongoing issues with her feet.  She does have some baseline neuropathy in her feet.  She has not any had any kind of trauma.  She has had a small lesion between her third and fourth phalanges on the right foot.  Dr. Milinda Pointer saw her for this in the been treating this with a phenol solution as well as some warm soaks.  She also has gotten a shoe with a wider toe box.  Globally she is a generally healthy patient.  She does take Synthroid for hypothyroidism as well as some atenolol. R lateral 3rd small   She also has had some bluish tent to her toes and forefoot recently.  She also has some in digit trauma to her feet bilaterally.  At baseline she does have extensive physical activity and she generally walks about 4 miles a day  ? Chillblains?  Phenol between her toes Works [place].   V tach history  Review of Systems is noted in the HPI, as appropriate  Objective:   BP 120/70   Pulse 82   Temp 97.7 F (36.5 C) (Temporal)   Ht 5\' 6"  (1.676 m)   Wt 114 lb 8 oz (51.9 kg)   SpO2 98%   BMI 18.48 kg/m   GEN: No acute distress; alert,appropriate.  PULM: Breathing comfortably in no respiratory distress PSYCH: Normally interactive.  DP and PT pulses appear to be diminished bilaterally.  I am able to palpate them, but they do appear decreased.  She does have some bluish tent to her forefoot and toes.  Between the third and fourth digit there is a small ulceration on the lateral aspect of the third digit.  She is also having some toe overlapping as well as some forefoot breakdown throughout both feet.  Laboratory and Imaging Data:  Assessment and Plan:     ICD-10-CM   1. Decreased pulses in feet  R09.89 VAS [place] ABI WITH/WO TBI  2. Intermittent claudication (HCC)  I73.9 VAS [place] ABI WITH/WO TBI  3. Acute foot pain, unspecified laterality  M79.673    Worried about her pulses, and I am going to get ABIs to evaluate this.  Her symptoms and history do not go along with Raynaud's phenomenon.  Chilblains is possible, but I would want to exclude a different systemic cause.  With regards to the small sore, I do think continuing her current treatment is entirely reasonable, but if she does have some arterial insufficiency, then this would significantly impact its healing.  Otherwise she is basically pretty healthy does not have diabetes  Follow-up: No follow-ups on file.  No orders of the  defined types were placed in this encounter.  There are no discontinued medications. Orders Placed This Encounter  Procedures  . VAS [place] ABI WITH/WO TBI    Signed,  Jaskaran Dauzat T. Hisashi Amadon, MD   Outpatient Encounter Medications as of 03/04/2019  Medication Sig  . ascorbic acid (VITAMIN C) 500 MG tablet Take 500 mg by mouth 2 (two) times daily.   Marland Kitchen atenolol (TENORMIN) 25 MG tablet Take 25 mg by mouth daily as needed.  Marland Kitchen buPROPion (WELLBUTRIN SR) 100 MG 12 hr tablet Take 1 tablet (100 mg total) by mouth 2 (two) times daily.  . Calcium Carb-Cholecalciferol (CALCIUM-VITAMIN D) 600-400 MG-UNIT TABS Take 1 tablet by mouth 2 (two) times daily.  Marland Kitchen  levothyroxine (SYNTHROID) 100 MCG tablet Take 1 tablet (100 mcg total) by mouth daily.   No facility-administered encounter medications on file as of 03/04/2019.

## 2019-03-04 NOTE — Telephone Encounter (Signed)
Per appt notes pt already has a in office appt scheduled for today at 11:40 with Dr Lorelei Pont.

## 2019-03-04 NOTE — Telephone Encounter (Signed)
[facility] - Client Nonclinical Telephone Record [facility] Client [place] [facility] - Client Client Site [place] - Night Physician Eliezer Lofts - MD Contact Type Call Who Is Calling Patient / Member / Family / Caregiver Caller Name Jarvis Lesure Caller Phone Number [phone] Patient Name Jessica Miles Patient DOB [birth year] Call Type Message Only Information Provided Reason for Call Request to Schedule Office Appointment Initial Comment Needs to make appt to have foot looked at. Pls call back Additional Comment Disp. Time Disposition Final User 03/04/2019 8:05:50 AM General Information Provided Yes Donato Heinz Call Closed By: Donato Heinz Transaction Date/Time: 03/04/2019 8:02:20 AM (ET)

## 2019-03-06 ENCOUNTER — Ambulatory Visit (INDEPENDENT_AMBULATORY_CARE_PROVIDER_SITE_OTHER): Payer: Medicare Other | Admitting: Podiatry

## 2019-03-06 ENCOUNTER — Encounter: Payer: Self-pay | Admitting: Podiatry

## 2019-03-06 ENCOUNTER — Other Ambulatory Visit: Payer: Self-pay

## 2019-03-06 ENCOUNTER — Ambulatory Visit (INDEPENDENT_AMBULATORY_CARE_PROVIDER_SITE_OTHER): Payer: Medicare Other

## 2019-03-06 DIAGNOSIS — T691XXA Chilblains, initial encounter: Secondary | ICD-10-CM

## 2019-03-06 DIAGNOSIS — S91109A Unspecified open wound of unspecified toe(s) without damage to nail, initial encounter: Secondary | ICD-10-CM

## 2019-03-06 DIAGNOSIS — T691XXD Chilblains, subsequent encounter: Secondary | ICD-10-CM | POA: Diagnosis not present

## 2019-03-06 MED ORDER — NITROGLYCERIN 0.2 MG/HR TD PT24: 0.2000 mg | MEDICATED_PATCH | Freq: Every day | TRANSDERMAL | 12 refills | Status: DC

## 2019-03-06 MED ORDER — MUPIROCIN 2 % EX OINT: TOPICAL_OINTMENT | CUTANEOUS | 1 refills | Status: DC

## 2019-03-06 MED ORDER — AMOXICILLIN-POT CLAVULANATE 875-125 MG PO TABS: 1.0000 | ORAL_TABLET | Freq: Two times a day (BID) | ORAL | 0 refills | Status: DC

## 2019-03-06 NOTE — Addendum Note (Signed)
Addended by: Carter Kitten on: 03/06/2019 11:55 AM   Modules accepted: Orders

## 2019-03-06 NOTE — Progress Notes (Signed)
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  She states that the other toes are doing fine now with the exception of them turning purple occasionally however the lateral aspect of the third toe on the right foot is still exquisitely painful.  She states that she is noticed some drainage.  Objective: Vital signs are stable she is alert and oriented x3.  Peripheral vascular disease is noted.  She does have a small 3 x 3 mm ulcerative lesion lateral aspect of the PIPJ with minimal surrounding erythema no purulence no malodor at this point I am unable to see deep into the wound.  Does not appear that this is probing deep to the joint.  Radiographs taken today do not demonstrate any type of osseous abnormalities in the area or even osseous breakdown that would indicate osteomyelitis.  Assessment: Probable peripheral vascular disease versus chilblains versus Raynaud's.  Ischemic ulceration lateral aspect third toe right foot.  Plan: Discussed etiology pathology and surgical therapies at this point time going to put a 0.2 mg nitro patch to the dorsum of the foot 12 hours on 12 hours off will notify us with any side effects such as hypotension dizziness headache.  Also I recommend that she continue to soak Epson salt and warm water dry the wound thoroughly and apply a small amount of Bactroban ointment and dress with the open hole dressing which I demonstrated to her today she understands and is amenable to it she will also obtain another prescription for Augmentin 875 mg 1 p.o. twice daily and I will follow-up with her in a couple of weeks.  Her primary provider had sent her for vascular evaluation though as of yet it has not been scheduled.  So we will schedule one for her rather than waiting on [facility] to get around to it.  Hopefully should be able to get this within the next week or so.

## 2019-03-11 ENCOUNTER — Telehealth: Payer: Self-pay

## 2019-03-11 NOTE — Telephone Encounter (Signed)
-----   Message from Rip Harbour, [facility] sent at 03/06/2019  9:18 AM EST ----- Regarding: Vascular Vascular -   ABI's/TBI's - history of PVD - open wound on toe, non-healing right foot   Vascular consult if abnormal  [facility]

## 2019-03-11 NOTE — Telephone Encounter (Signed)
Patient is scheduled with [facility] cardiovascular center on 03/13/19

## 2019-03-11 NOTE — Telephone Encounter (Signed)
-----   Message from Rip Harbour, [facility] sent at 03/06/2019  9:18 AM EST ----- Regarding: Vascular Vascular -   ABI's/TBI's - history of PVD - open wound on toe, non-healing right foot   Vascular consult if abnormal  [facility]

## 2019-03-13 ENCOUNTER — Ambulatory Visit (HOSPITAL_COMMUNITY)
Admission: RE | Admit: 2019-03-13 | Discharge: 2019-03-13 | Disposition: A | Discharge status: Home / Self Care | Payer: Medicare Other | Source: Ambulatory Visit | Attending: Internal Medicine | Admitting: Internal Medicine

## 2019-03-13 ENCOUNTER — Other Ambulatory Visit: Payer: Self-pay

## 2019-03-13 DIAGNOSIS — R0989 Other specified symptoms and signs involving the circulatory and respiratory systems: Secondary | ICD-10-CM | POA: Diagnosis not present

## 2019-03-16 ENCOUNTER — Encounter: Payer: Self-pay | Admitting: Family Medicine

## 2019-03-16 ENCOUNTER — Encounter: Payer: Self-pay | Admitting: Podiatry

## 2019-03-18 ENCOUNTER — Other Ambulatory Visit: Payer: Self-pay | Admitting: Family Medicine

## 2019-03-18 DIAGNOSIS — I739 Peripheral vascular disease, unspecified: Secondary | ICD-10-CM

## 2019-03-18 NOTE — Progress Notes (Signed)
Consult vasc surgery

## 2019-03-22 ENCOUNTER — Encounter (INDEPENDENT_AMBULATORY_CARE_PROVIDER_SITE_OTHER): Payer: Self-pay | Admitting: Vascular Surgery

## 2019-03-22 ENCOUNTER — Other Ambulatory Visit: Payer: Self-pay

## 2019-03-22 ENCOUNTER — Ambulatory Visit (INDEPENDENT_AMBULATORY_CARE_PROVIDER_SITE_OTHER): Payer: Medicare Other | Admitting: Vascular Surgery

## 2019-03-22 VITALS — BP 146/84 | HR 96 | Ht 66.0 in | Wt 110.0 lb

## 2019-03-22 DIAGNOSIS — R230 Cyanosis: Secondary | ICD-10-CM | POA: Insufficient documentation

## 2019-03-22 DIAGNOSIS — G609 Hereditary and idiopathic neuropathy, unspecified: Secondary | ICD-10-CM

## 2019-03-22 NOTE — Assessment & Plan Note (Signed)
Noninvasive studies were performed recently at another institution which demonstrated completely normal ABIs with triphasic waveforms although the digital pressures were somewhat dampened a little more so on the left foot than the right foot. The patient does not have significant large vessel vascular disease/PAD.  There may be some microscopic circulatory dysfunction particular in the right foot where she has had previous trauma and surgery.  There is likely also a component of ray nodes as this happened mostly in cold weather.  I have advised her to keep her feet warm and continue her activity.  We did discuss that her neuropathy is likely playing a role as well.  At this point, there is no role for further invasive vascular treatment or evaluation.  I will see her back as needed.

## 2019-03-22 NOTE — Assessment & Plan Note (Signed)
We discussed today that her neuropathy could also be playing a role and poor autonomic

## 2019-03-22 NOTE — Patient Instructions (Signed)
Raynaud Phenomenon ° °Raynaud phenomenon is a condition that affects the blood vessels (arteries) that carry blood to your fingers and toes. The arteries that supply blood to your ears, lips, nipples, or the tip of your nose might also be affected. Raynaud phenomenon causes the arteries to become narrow temporarily (spasm). As a result, the flow of blood to the affected areas is temporarily decreased. This usually occurs in response to cold temperatures or stress. During an attack, the skin in the affected areas turns white, then blue, and finally red. You may also feel tingling or numbness in those areas. °Attacks usually last for only a brief period, and then the blood flow to the area returns to normal. In most cases, Raynaud phenomenon does not cause serious health problems. °What are the causes? °In many cases, the cause of this condition is not known. The condition may occur on its own (primary Raynaud phenomenon) or may be associated with other diseases or factors (secondary Raynaud phenomenon). °Possible causes may include: °· Diseases or medical conditions that damage the arteries. °· Injuries and repetitive actions that hurt the hands or feet. °· Being exposed to certain chemicals. °· Taking medicines that narrow the arteries. °· Other medical conditions, such as lupus, scleroderma, rheumatoid arthritis, thyroid problems, blood disorders, Sjogren syndrome, or atherosclerosis. °What increases the risk? °The following factors may make you more likely to develop this condition: °· Being 20-40 years old. °· Being female. °· Having a family history of Raynaud phenomenon. °· Living in a cold climate. °· Smoking. °What are the signs or symptoms? °Symptoms of this condition usually occur when you are exposed to cold temperatures or when you have emotional stress. The symptoms may last for a few minutes or up to several hours. They usually affect your fingers but may also affect your toes, nipples, lips, ears, or  the tip of your nose. Symptoms may include: °· Changes in skin color. The skin in the affected areas will turn pale or white. The skin may then change from white to bluish to red as normal blood flow returns to the area. °· Numbness, tingling, or pain in the affected areas. °In severe cases, symptoms may include: °· Skin sores. °· Tissues decaying and dying (gangrene). °How is this diagnosed? °This condition may be diagnosed based on: °· Your symptoms and medical history. °· A physical exam. During the exam, you may be asked to put your hands in cold water to check for a reaction to cold temperature. °· Tests, such as: °? Blood tests to check for other diseases or conditions. °? A test to check the movement of blood through your arteries and veins (vascular ultrasound). °? A test in which the skin at the base of your fingernail is examined under a microscope (nailfold capillaroscopy). °How is this treated? °Treatment for this condition often involves making lifestyle changes and taking steps to control your exposure to cold temperatures. For more severe cases, medicine (calcium channel blockers) may be used to improve blood flow. Surgery is sometimes done to block the nerves that control the affected arteries, but this is rare. °Follow these instructions at home: °Avoiding cold temperatures °Take these steps to avoid exposure to cold: °· If possible, stay indoors during cold weather. °· When you go outside during cold weather, dress in layers and wear mittens, a hat, a scarf, and warm footwear. °· Wear mittens or gloves when handling ice or frozen food. °· Use holders for glasses or cans containing cold drinks. °·   Let warm water run for a while before taking a shower or bath. °· Warm up the car before driving in cold weather. °Lifestyle ° °· If possible, avoid stressful and emotional situations. Try to find ways to manage your stress, such as: °? Exercise. °? Yoga. °? Meditation. °? Biofeedback. °· Do not use any  products that contain nicotine or tobacco, such as cigarettes and e-cigarettes. If you need help quitting, ask your health care provider. °· Avoid secondhand smoke. °· Limit your use of caffeine. °? Switch to decaffeinated coffee, tea, and soda. °? Avoid chocolate. °· Avoid vibrating tools and machinery. °General instructions °· Protect your hands and feet from injuries, cuts, or bruises. °· Avoid wearing tight rings or wristbands. °· Wear loose fitting socks and comfortable, roomy shoes. °· Take over-the-counter and prescription medicines only as told by your health care provider. °Contact a health care provider if: °· Your discomfort becomes worse despite lifestyle changes. °· You develop sores on your fingers or toes that do not heal. °· Your fingers or toes turn black. °· You have breaks in the skin on your fingers or toes. °· You have a fever. °· You have pain or swelling in your joints. °· You have a rash. °· Your symptoms occur on only one side of your body. °Summary °· Raynaud phenomenon is a condition that affects the arteries that carry blood to your fingers, toes, ears, lips, nipples, or the tip of your nose. °· In many cases, the cause of this condition is not known. °· Symptoms of this condition include changes in skin color, and numbness and tingling of the affected area. °· Treatment for this condition includes lifestyle changes, reducing exposure to cold temperatures, and using medicines for severe cases of the condition. °· Contact your health care provider if your condition worsens despite treatment. °This information is not intended to replace advice given to you by your health care provider. Make sure you discuss any questions you have with your health care provider. °Document Revised: 12/30/2016 Document Reviewed: 02/08/2016 °Elsevier Patient Education © 2020 [employer] ° °

## 2019-03-22 NOTE — Progress Notes (Signed)
Patient ID: Jessica Miles, female   DOB: Feb 09, 1945, 74 y.o.   MRN: [id number]  Chief Complaint  Patient presents with  . New Patient (Initial Visit)    Blue toes    HPI Jessica Miles is a 74 y.o. female.  I am asked to see the patient by Dr. Lorelei Pont for evaluation of nonhealing ulcerations on the right foot as well as pain.  She has seen podiatry as well and has been told she may have chilblains versus Raynaud's phenomenon versus peripheral arterial disease.  She is some topical therapies and her ulcers have nearly completely healed.  The pain is much better.  She is a very active and vigorous woman and was walking 4 miles a day starting at 4:30 in the morning even in the winter.  She was told to stop when her sores developed and she is eager to resume that.  She has been diagnosed with neuropathy and has had both trauma and surgery on her right foot previously.  Noninvasive studies were performed recently at another institution which demonstrated completely normal ABIs with triphasic waveforms although the digital pressures were somewhat dampened a little more so on the left foot than the right foot.  With this finding, she is referred for further evaluation and treatment.     Past Medical History:  Diagnosis Date  . Anxiety   . History of IBS   . HPV (human papilloma virus) infection   . Loss of taste   . Neuropathy 2015   surgery for Mortons neuroma in 1985  . Osteopenia   . Tachycardia   . Tachycardia   . Thyroid disease     Past Surgical History:  Procedure Laterality Date  . [place]  . WISDOM TOOTH EXTRACTION       Family History  Problem Relation Age of Onset  . Heart failure Mother   . Heart attack Mother   . Heart disease Mother   . Heart attack Father   . Heart disease Father   . Heart attack Maternal Grandfather   . Diabetes Son        IDDM  . Heart disease Maternal Grandmother   . Thyroid disease Neg Hx     Social History   Tobacco Use    . Smoking status: Never Smoker  . Smokeless tobacco: Never Used  Substance Use Topics  . Alcohol use: Yes    Comment: 2-3 glasses per week  . Drug use: No     Allergies  Allergen Reactions  . Fluticasone Other (See Comments)    Nose bleeds  . Mometasone Other (See Comments)    Epistaxis  . Fexofenadine Palpitations    Current Outpatient Medications  Medication Sig Dispense Refill  . amoxicillin-clavulanate (AUGMENTIN) 875-125 MG tablet Take 1 tablet by mouth 2 (two) times daily. 20 tablet 0  . ascorbic acid (VITAMIN C) 500 MG tablet Take 500 mg by mouth 2 (two) times daily.     Marland Kitchen atenolol (TENORMIN) 25 MG tablet Take 25 mg by mouth daily as needed.    Marland Kitchen buPROPion (WELLBUTRIN SR) 100 MG 12 hr tablet Take 1 tablet (100 mg total) by mouth 2 (two) times daily. 180 tablet 1  . Calcium Carb-Cholecalciferol (CALCIUM-VITAMIN D) 600-400 MG-UNIT TABS Take 1 tablet by mouth 2 (two) times daily.    Marland Kitchen levothyroxine (SYNTHROID) 100 MCG tablet Take 1 tablet (100 mcg total) by mouth daily. 90 tablet 3  . mupirocin ointment (BACTROBAN) 2 %  Apply to wound after soaking BID (Patient not taking: Reported on 03/22/2019) 30 g 1  . nitroGLYCERIN (NITRO-DUR) 0.2 mg/hr patch Place 1 patch (0.2 mg total) onto the skin daily. (Patient not taking: Reported on 03/22/2019) 30 patch 12   No current facility-administered medications for this visit.      REVIEW OF SYSTEMS (Negative unless checked)  Constitutional: [] Weight loss  [] Fever  [] Chills Cardiac: [] Chest pain   [] Chest pressure   [x] Palpitations   [] Shortness of breath when laying flat   [] Shortness of breath at rest   [] Shortness of breath with exertion. Vascular:  [] Pain in legs with walking   [] Pain in legs at rest   [] Pain in legs when laying flat   [] Claudication   [] Pain in feet when walking  [] Pain in feet at rest  [] Pain in feet when laying flat   [] History of DVT   [] Phlebitis   [] Swelling in legs   [] Varicose veins   [x] Non-healing  ulcers Pulmonary:   [] Uses home oxygen   [] Productive cough   [] Hemoptysis   [] Wheeze  [] COPD   [] Asthma Neurologic:  [] Dizziness  [] Blackouts   [] Seizures   [] History of stroke   [] History of TIA  [] Aphasia   [] Temporary blindness   [] Dysphagia   [] Weakness or numbness in arms   [] Weakness or numbness in legs Musculoskeletal:  [] Arthritis   [] Joint swelling   [] Joint pain   [] Low back pain Hematologic:  [] Easy bruising  [] Easy bleeding   [] Hypercoagulable state   [] Anemic  [] Hepatitis Gastrointestinal:  [] Blood in stool   [] Vomiting blood  [] Gastroesophageal reflux/heartburn   [] Abdominal pain Genitourinary:  [] Chronic kidney disease   [] Difficult urination  [] Frequent urination  [] Burning with urination   [] Hematuria Skin:  [] Rashes   [x] Ulcers   [x] Wounds Psychological:  [x] History of anxiety   []  History of major depression.    Physical Exam BP (!) 146/84   Pulse 96   Ht 5\' 6"  (1.676 m)   Wt 110 lb (49.9 kg)   BMI 17.75 kg/m  Gen:  WD/WN, NAD Head: [place]/AT, No temporalis wasting.  Ear/Nose/Throat: Hearing grossly intact, nares w/o erythema or drainage, oropharynx w/o Erythema/Exudate Eyes: Conjunctiva clear, sclera non-icteric  Neck: trachea midline.  No JVD.  Pulmonary:  Good air movement, respirations not labored, no use of accessory muscles  Cardiac: RRR, no JVD Vascular:  Vessel Right Left  Radial Palpable Palpable                          DP  1+  2+  PT  2+  2+   Gastrointestinal:. No masses, surgical incisions, or scars. Musculoskeletal: M/S 5/5 throughout.  Previous ulcerations appear to have largely healed with a small scab between toes 3 and 4.  Some cyanosis present on toes 2, 3, and 4 on the right foot and toes 2 on the left foot.  No edema. Neurologic: Sensation grossly intact in extremities.  Symmetrical.  Speech is fluent. Motor exam as listed above. Psychiatric: Judgment intact, Mood & affect appropriate for pt's clinical situation. Dermatologic: No rashes  or ulcers noted.  No cellulitis or open wounds.    Radiology DG Foot Complete Left  Result Date: 03/06/2019 Please see detailed radiograph report in office note.  DG Foot Complete Right  Result Date: 03/06/2019 Please see detailed radiograph report in office note.  VAS [place] LE ART SEG MULTI (Segm&LE Reynauds)  Result Date: 03/13/2019 LOWER EXTREMITY DOPPLER STUDY Indications: Early January  2021 the patient had been walking 5 miles daily out              in the cold each morning and started to notice a blister forming in              the lateral aspect of the right 3rd toe. She has been using              antibiotic cream on it for the past 6 weeks and has noticed a              marked improvement. She also has noticed her toes will turn purple              when exposed to the cold. High Risk Factors: None, no history of smoking.  Comparison Study: None Performing Technologist: Alecia Mackin RVT, RDCS (AE), RDMS  Examination Guidelines: A complete evaluation includes at minimum, Doppler waveform signals and systolic blood pressure reading at the level of bilateral brachial, anterior tibial, and posterior tibial arteries, when vessel segments are accessible. Bilateral testing is considered an integral part of a complete examination. Photoelectric Plethysmograph (PPG) waveforms and toe systolic pressure readings are included as required and additional duplex testing as needed. Limited examinations for reoccurring indications may be performed as noted.  ABI Findings: +---------+------------------+-----+---------+--------+ Right    Rt Pressure (mmHg)IndexWaveform Comment  +---------+------------------+-----+---------+--------+ Brachial 127                                      +---------+------------------+-----+---------+--------+ CFA                             triphasic         +---------+------------------+-----+---------+--------+ Popliteal                       triphasic          +---------+------------------+-----+---------+--------+ ATA      161               1.20 triphasic         +---------+------------------+-----+---------+--------+ PTA      176               1.31 triphasic         +---------+------------------+-----+---------+--------+ PERO     182               1.36 triphasic         +---------+------------------+-----+---------+--------+ Great Toe91                0.68 Abnormal          +---------+------------------+-----+---------+--------+ +---------+------------------+-----+---------+-------+ Left     Lt Pressure (mmHg)IndexWaveform Comment +---------+------------------+-----+---------+-------+ Brachial 134                                     +---------+------------------+-----+---------+-------+ CFA                             triphasic        +---------+------------------+-----+---------+-------+ Popliteal                       triphasic        +---------+------------------+-----+---------+-------+ ATA  170               1.27 triphasic        +---------+------------------+-----+---------+-------+ PTA      172               1.28 triphasic        +---------+------------------+-----+---------+-------+ PERO     151               1.13 triphasic        +---------+------------------+-----+---------+-------+ Great Toe76                0.57 Abnormal         +---------+------------------+-----+---------+-------+ +-------+-----------+-----------+------------+------------+ ABI/TBIToday's ABIToday's TBIPrevious ABIPrevious TBI +-------+-----------+-----------+------------+------------+ Right  1.36       .68                                 +-------+-----------+-----------+------------+------------+ Left   1.28       .57                                 +-------+-----------+-----------+------------+------------+ TOES Findings: +----------+---------------+--------+-------+ Right ToesPressure  (mmHg)WaveformComment +----------+---------------+--------+-------+ 1st Digit                Abnormal        +----------+---------------+--------+-------+ 2nd Digit                Normal          +----------+---------------+--------+-------+ 3rd Digit                Normal          +----------+---------------+--------+-------+ 4th Digit                Normal          +----------+---------------+--------+-------+ 5th Digit                Normal          +----------+---------------+--------+-------+  +---------+---------------+--------+-------+ Left ToesPressure (mmHg)WaveformComment +---------+---------------+--------+-------+ 1st Digit               Abnormal        +---------+---------------+--------+-------+ 2nd Digit               Abnormal        +---------+---------------+--------+-------+ 3rd Digit               Normal          +---------+---------------+--------+-------+ 4th Digit               Normal          +---------+---------------+--------+-------+ 5th Digit               Normal          +---------+---------------+--------+-------+   Summary: Right: Resting right ankle-brachial index indicates noncompressible right lower extremity arteries. The right toe-brachial index is abnormal. Specifically, there is normal blood flow to the affected third toe. Left: Resting left ankle-brachial index is within normal range. No evidence of significant left lower extremity arterial disease. The left toe-brachial index is abnormal.  *See table(s) above for measurements and observations.  Electronically signed by Ida Rogue MD on 03/13/2019 at 9:11:00 PM.    Final     Labs No results found for this or any previous visit (from the past 2160 hour(s)).  Assessment/Plan:  Peripheral neuropathy We discussed today that her neuropathy  could also be playing a role and poor autonomic  Extremity cyanosis Noninvasive studies were performed recently at another  institution which demonstrated completely normal ABIs with triphasic waveforms although the digital pressures were somewhat dampened a little more so on the left foot than the right foot. The patient does not have significant large vessel vascular disease/PAD.  There may be some microscopic circulatory dysfunction particular in the right foot where she has had previous trauma and surgery.  There is likely also a component of ray nodes as this happened mostly in cold weather.  I have advised her to keep her feet warm and continue her activity.  We did discuss that her neuropathy is likely playing a role as well.  At this point, there is no role for further invasive vascular treatment or evaluation.  I will see her back as needed.      Leotis Pain 03/22/2019, 12:13 PM   This note was created with Dragon medical transcription system.  Any errors from dictation are unintentional.

## 2019-03-27 ENCOUNTER — Encounter: Payer: Self-pay | Admitting: Podiatry

## 2019-03-27 ENCOUNTER — Ambulatory Visit (INDEPENDENT_AMBULATORY_CARE_PROVIDER_SITE_OTHER): Payer: Medicare Other | Admitting: Podiatry

## 2019-03-27 ENCOUNTER — Other Ambulatory Visit: Payer: Self-pay

## 2019-03-27 VITALS — Temp 98.1°F

## 2019-03-27 DIAGNOSIS — T691XXD Chilblains, subsequent encounter: Secondary | ICD-10-CM

## 2019-03-27 DIAGNOSIS — S91109A Unspecified open wound of unspecified toe(s) without damage to nail, initial encounter: Secondary | ICD-10-CM

## 2019-03-27 NOTE — Progress Notes (Signed)
She presents today for follow-up of her superficial ulceration to the lateral aspect third toe right foot.  She states that it seems to be doing much better with the antibiotic ointment that he gave me and the nitro patches.  States that she saw Dr. Lazaro Arms for her vascular exam stating "we had confirmed as most likely Raynaud's.  Objective: Vital signs are stable alert oriented x3.  Pulses are palpable.  Ulceration is gone on to heal there is no erythema cellulitis drainage or odor no signs of infection.  Reviewed the vascular results with her today.  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  Plan: Follow-up with me on an as-needed basis.

## 2019-05-20 ENCOUNTER — Telehealth: Payer: Self-pay

## 2019-05-20 NOTE — Telephone Encounter (Signed)
[place] [facility] - Client TELEPHONE ADVICE RECORD Access[facility]urse Patient Name: Jessica Miles Gender: Female DOB: [birth year] Age: 74 Y 23 M 19 D Return Phone Number: [phone] (Primary) Address: City/State/Zip: [place] [address] Client [facility] - Client Client Site [place] - Day Physician Eliezer Lofts - MD Contact Type Call Who Is Calling Patient / Member / Family / Caregiver Call Type Triage / Clinical Relationship To Patient Self Return Phone Number [phone] (Primary) Chief Complaint Abdominal Pain Reason for Call Symptomatic / Request for [place] reports she is having abdominal pain, has IBS and interstial cystitis, taking AZO and is wanting advice. Caller states she is wondering if she is doing something wrong with lifting weights etc. Additional Comment Caller was trasnferred from the office and isn't sure why as she only wanted an appt. Translation No Nurse Assessment Guidelines Guideline Title Affirmed Question Affirmed Notes Nurse Date/Time (Eastern Time) Abdominal Pain - Female [1] MILD-MODERATE pain AND [2] constant AND [3] present > 2 hours Neena Rhymes, RNSefora, Mada 05/20/2019 8:39:33 AM Disp. Time Eilene Ghazi Time) Disposition Final User 05/20/2019 8:45:28 AM See HCP within 4 Hours (or PCP triage) Yes Neena Rhymes, RN, Julio Sicks Disagree/Comply Disagree Caller Understands Yes PreDisposition Call Doctor Care Advice Given Per Guideline SEE HCP WITHIN 4 HOURS (OR PCP TRIAGE): * IF OFFICE WILL BE OPEN: You need to be seen within the next 3 or 4 hours. Call your doctor (or NP/PA) now or as soon as the office opens. * Lie down and rest. * Do not eat or drink anything for now. * You become worse. CARE ADVICE given per Abdominal Pain, Female (Adult) guideline. PLEASE NOTE: All timestamps contained within this report are represented as [place] Standard  Time. CONFIDENTIALTY NOTICE: This fax transmission is intended only for the addressee. It contains information that is legally privileged, confidential or otherwise protected from use or disclosure. If you are not the intended recipient, you are strictly prohibited from reviewing, disclosing, copying using or disseminating any of this information or taking any action in reliance on or regarding this information. If you have received this fax in error, please notify us immediately by telephone so that we can arrange for its return to [place]. Phone: [phone], Toll-Free: [phone], Fax: [phone] Page: 2 of 2 Call Id: [id number] Comments User: Fayne Mediate, RN Date/Time Eilene Ghazi Time): 05/20/2019 8:39:22 AM Caller reports she is having abdominal pain, has IBS and interstial cystitis, taking AZO and is wanting advice. Caller states she is wondering if she is doing something wrong with lifting weights etc. Has been taking AZO 6-7 days. No contact with COVID has been immunized for COVID. User: Fayne Mediate, RN Date/Time Eilene Ghazi Time): 05/20/2019 8:45:16 AM Pt states she does not have time to go to doctor today reinforced recommendation to be seen within 4 hours PT declined transferred to office. Referrals GO TO FACILITY REFUSED REFERRED TO PCP OFFICE

## 2019-05-20 NOTE — Telephone Encounter (Signed)
Pt already has in office appt with Dr Diona Browner on 05/21/19 at 8:40.

## 2019-05-21 ENCOUNTER — Encounter: Payer: Self-pay | Admitting: Family Medicine

## 2019-05-21 ENCOUNTER — Ambulatory Visit (INDEPENDENT_AMBULATORY_CARE_PROVIDER_SITE_OTHER): Payer: Medicare Other | Admitting: Family Medicine

## 2019-05-21 ENCOUNTER — Other Ambulatory Visit: Payer: Self-pay

## 2019-05-21 VITALS — BP 106/64 | HR 76 | Temp 98.1°F | Ht 66.0 in | Wt 111.8 lb

## 2019-05-21 DIAGNOSIS — K582 Mixed irritable bowel syndrome: Secondary | ICD-10-CM | POA: Diagnosis not present

## 2019-05-21 DIAGNOSIS — N3289 Other specified disorders of bladder: Secondary | ICD-10-CM

## 2019-05-21 DIAGNOSIS — R3915 Urgency of urination: Secondary | ICD-10-CM | POA: Diagnosis not present

## 2019-05-21 DIAGNOSIS — K644 Residual hemorrhoidal skin tags: Secondary | ICD-10-CM | POA: Insufficient documentation

## 2019-05-21 LAB — POC URINALSYSI DIPSTICK (AUTOMATED)
Bilirubin, UA: NEGATIVE
Blood, UA: NEGATIVE
Glucose, UA: NEGATIVE
Ketones, UA: NEGATIVE
Leukocytes, UA: NEGATIVE
Nitrite, UA: NEGATIVE
Protein, UA: NEGATIVE
Spec Grav, UA: 1.01 (ref 1.010–1.025)
Urobilinogen, UA: 0.2 E.U./dL
pH, UA: 6.5 (ref 5.0–8.0)

## 2019-05-21 MED ORDER — AMITRIPTYLINE HCL 25 MG PO TABS: 25.0000 mg | ORAL_TABLET | Freq: Every day | ORAL | 11 refills | Status: DC

## 2019-05-21 MED ORDER — HYDROCORT-PRAMOXINE (PERIANAL) 2.5-1 % EX CREA: TOPICAL_CREAM | Freq: Three times a day (TID) | CUTANEOUS | 0 refills | Status: DC

## 2019-05-21 NOTE — Assessment & Plan Note (Signed)
Likely due to change in BMs, now resolved. Treat with topical med.

## 2019-05-21 NOTE — Assessment & Plan Note (Signed)
Does not current seem liek this is the cause of suprapubic pain.Marland Kitchen pt very familiar with what IBS flare feels like.

## 2019-05-21 NOTE — Assessment & Plan Note (Signed)
Nml UA.  Most liekly interstitial cystitis.  Stop irritant foods and drinks, increase water intake.  Stop Azo.  Start trial of amytryptiline low dose. If not improving symptoms.. refer to urologist for likely cytology and cystoscopy ( pt is hesitent about procedures and wishes to hold off on referral to URo at this time).

## 2019-05-21 NOTE — Patient Instructions (Addendum)
Stop alcohol, acidic foods (citrus, tomatos, strawberries), spicy foods,  soda, peppermint, caffeine, chocolate.  Increase water some.  Stop Azo.  Start amitriptyline at bedtime.  If not improving in 2 -4 weeks.. call to refer to urology for further evaluation for interstitial cystitis.  Apply cream as directed for hemorrhoids.

## 2019-05-21 NOTE — Progress Notes (Signed)
Chief Complaint  Patient presents with  . Abdominal Pain  . Urinary Urgency  . Hemorrhoids    History of Present Illness: HPI    74 year old female with history of hypothyroidism, MDD, and IBS presents with 3 weeks of dull abdominal pain.  She reports she has had similar pain in past that was associated with bladder irritation ( no UTI) In past it has typically improved with time, but it has not this time.  She describes it as bilateral suprapubic abdominal pressure x 3 week. Feels like she needs to urinate, increase frequency. Temporarily relieved. Advil helps the pressure.  She has had more recent looser off and on  With her IBS after taking antibiotics earlier in year (sinuses and foot, none in last month). Restricted diet: on FODMAP diet. She now has normal BMs.  She has noted small amount of blood after BM, burning occ at rectum.  Has been applying Desitin, prep H, suppositories.  She drinks 20 oz water twice a day. She has started drinking 2 glassess of wine a day in last year.. more than in past.   She has also noted she is tired.. not able to lift as much weights.  She has been taking azo which helps with pain. Urinalysis    Component Value Date/Time   BILIRUBINUR Negative 05/21/2019 0857   PROTEINUR Negative 05/21/2019 0857   UROBILINOGEN 0.2 05/21/2019 0857   NITRITE Negative 05/21/2019 0857   LEUKOCYTESUR Negative 05/21/2019 0857       This visit occurred during the SARS-CoV-2 public health emergency.  Safety protocols were in place, including screening questions prior to the visit, additional usage of staff PPE, and extensive cleaning of exam room while observing appropriate contact time as indicated for disinfecting solutions.   COVID 19 screen:  No recent travel or known exposure to COVID19 The patient denies respiratory symptoms of COVID 19 at this time. The importance of social distancing was discussed today.     ROS    Past Medical History:   Diagnosis Date  . Anxiety   . History of IBS   . HPV (human papilloma virus) infection   . Loss of taste   . Neuropathy 2015   surgery for Mortons neuroma in 1985  . Osteopenia   . Tachycardia   . Tachycardia   . Thyroid disease     reports that she has never smoked. She has never used smokeless tobacco. She reports current alcohol use. She reports that she does not use drugs.   Current Outpatient Medications:  .  ascorbic acid (VITAMIN C) 500 MG tablet, Take 500 mg by mouth 2 (two) times daily. , Disp: , Rfl:  .  atenolol (TENORMIN) 25 MG tablet, Take 25 mg by mouth daily as needed., Disp: , Rfl:  .  buPROPion (WELLBUTRIN SR) 100 MG 12 hr tablet, Take 1 tablet (100 mg total) by mouth 2 (two) times daily., Disp: 180 tablet, Rfl: 1 .  Calcium Carb-Cholecalciferol (CALCIUM-VITAMIN D) 600-400 MG-UNIT TABS, Take 1 tablet by mouth 2 (two) times daily., Disp: , Rfl:  .  levothyroxine (SYNTHROID) 100 MCG tablet, Take 1 tablet (100 mcg total) by mouth daily., Disp: 90 tablet, Rfl: 3 .  nitroGLYCERIN (NITRO-DUR) 0.2 mg/hr patch, Place 1 patch (0.2 mg total) onto the skin daily., Disp: 30 patch, Rfl: 12 .  XIIDRA 5 % SOLN, , Disp: , Rfl:    Observations/Objective: Blood pressure 106/64, pulse 76, temperature 98.1 F (36.7 C), temperature source Temporal, height  5\' 6"  (1.676 m), weight 111 lb 12 oz (50.7 kg), SpO2 99 %.  Physical Exam Exam conducted with a chaperone present.  Constitutional:      General: She is not in acute distress.    Appearance: Normal appearance. She is well-developed. She is not ill-appearing or toxic-appearing.  HENT:     Head: Normocephalic.     Right Ear: Hearing, tympanic membrane, ear canal and external ear normal. Tympanic membrane is not erythematous, retracted or bulging.     Left Ear: Hearing, tympanic membrane, ear canal and external ear normal. Tympanic membrane is not erythematous, retracted or bulging.     Nose: No mucosal edema or rhinorrhea.     Right  Sinus: No maxillary sinus tenderness or frontal sinus tenderness.     Left Sinus: No maxillary sinus tenderness or frontal sinus tenderness.     Mouth/Throat:     Pharynx: Uvula midline.  Eyes:     General: Lids are normal. Lids are everted, no foreign bodies appreciated.     Conjunctiva/sclera: Conjunctivae normal.     Pupils: Pupils are equal, round, and reactive to light.  Neck:     Thyroid: No thyroid mass or thyromegaly.     Vascular: No carotid bruit.     Trachea: Trachea normal.  Cardiovascular:     Rate and Rhythm: Normal rate and regular rhythm.     Pulses: Normal pulses.     Heart sounds: Normal heart sounds, S1 normal and S2 normal. No murmur. No friction rub. No gallop.   Pulmonary:     Effort: Pulmonary effort is normal. No tachypnea or respiratory distress.     Breath sounds: Normal breath sounds. No decreased breath sounds, wheezing, rhonchi or rales.  Abdominal:     General: Bowel sounds are normal.     Palpations: Abdomen is soft.     Tenderness: There is no abdominal tenderness.  Genitourinary:    Exam position: Knee-chest position.     Pubic Area: No rash.      Labia:        Right: No rash.        Left: No rash.      Urethra: No urethral pain.     Vagina: Normal.     Adnexa:        Right: No tenderness.         Left: No tenderness.       Rectum: Guaiac result negative. External hemorrhoid present. No mass, tenderness, anal fissure or internal hemorrhoid. Normal anal tone.  Musculoskeletal:     Cervical back: Normal range of motion and neck supple.  Skin:    General: Skin is warm and dry.     Findings: No rash.  Neurological:     Mental Status: She is alert.  Psychiatric:        Mood and Affect: Mood is not anxious or depressed.        Speech: Speech normal.        Behavior: Behavior normal. Behavior is cooperative.        Thought Content: Thought content normal.        Judgment: Judgment normal.      Assessment and Plan   Bladder irritation Nml  UA.  Most liekly interstitial cystitis.  Stop irritant foods and drinks, increase water intake.  Stop Azo.  Start trial of amytryptiline low dose. If not improving symptoms.. refer to urologist for likely cytology and cystoscopy ( pt is hesitent about procedures and wishes  to hold off on referral to URo at this time).  External hemorrhoid Likely due to change in BMs, now resolved. Treat with topical med.  IBS (irritable bowel syndrome) Does not current seem liek this is the cause of suprapubic pain.Marland Kitchen pt very familiar with what IBS flare feels like.     Eliezer Lofts, MD

## 2019-05-22 NOTE — Telephone Encounter (Signed)
Pt had appt on 05/21/19.

## 2019-06-11 MED ORDER — LEVOTHYROXINE SODIUM 100 MCG PO TABS: 100.0000 ug | ORAL_TABLET | Freq: Every day | ORAL | 0 refills | Status: DC

## 2019-06-14 ENCOUNTER — Other Ambulatory Visit: Payer: Self-pay | Admitting: Family Medicine

## 2019-08-14 DIAGNOSIS — Z23 Encounter for immunization: Secondary | ICD-10-CM

## 2019-08-14 MED ORDER — SHINGRIX 50 MCG/0.5ML IM SUSR: 0.5000 mL | Freq: Once | INTRAMUSCULAR | 1 refills | Status: AC

## 2019-09-18 ENCOUNTER — Encounter: Payer: Self-pay | Admitting: Podiatry

## 2019-09-23 ENCOUNTER — Other Ambulatory Visit: Payer: Medicare Other

## 2019-09-23 ENCOUNTER — Other Ambulatory Visit: Payer: Self-pay

## 2019-09-23 DIAGNOSIS — Z20822 Contact with and (suspected) exposure to covid-19: Secondary | ICD-10-CM

## 2019-09-24 LAB — NOVEL CORONAVIRUS, NAA: SARS-CoV-2, NAA: DETECTED — AB

## 2019-09-24 LAB — SARS-COV-2, NAA 2 DAY TAT

## 2019-09-25 ENCOUNTER — Other Ambulatory Visit: Payer: Self-pay | Admitting: Internal Medicine

## 2019-09-25 DIAGNOSIS — U071 COVID-19: Secondary | ICD-10-CM

## 2019-09-25 NOTE — Progress Notes (Signed)
I connected by phone with Jessica Miles on 09/25/2019 at 9:09 PM to discuss the potential use of a new treatment for mild to moderate COVID-19 viral infection in non-hospitalized patients.  This patient is a 74 y.o. female that meets the FDA criteria for Emergency Use Authorization of COVID monoclonal antibody casirivimab/imdevimab.  Has a (+) direct SARS-CoV-2 viral test result  Has mild or moderate COVID-19   Is NOT hospitalized due to COVID-19  Is within 10 days of symptom onset  Has at least one of the high risk factor(s) for progression to severe COVID-19 and/or hospitalization as defined in EUA.  Specific high risk criteria : Older age (>/= 75 yo)   I have spoken and communicated the following to the patient or parent/caregiver regarding COVID monoclonal antibody treatment:  1. FDA has authorized the emergency use for the treatment of mild to moderate COVID-19 in adults and pediatric patients with positive results of direct SARS-CoV-2 viral testing who are 25 years of age and older weighing at least 40 kg, and who are at high risk for progressing to severe COVID-19 and/or hospitalization.  2. The significant known and potential risks and benefits of COVID monoclonal antibody, and the extent to which such potential risks and benefits are unknown.  3. Information on available alternative treatments and the risks and benefits of those alternatives, including clinical trials.  4. Patients treated with COVID monoclonal antibody should continue to self-isolate and use infection control measures (e.g., wear mask, isolate, social distance, avoid sharing personal items, clean and disinfect "high touch" surfaces, and frequent handwashing) according to CDC guidelines.   5. The patient or parent/caregiver has the option to accept or refuse COVID monoclonal antibody treatment.  After reviewing this information with the patient, The patient agreed to proceed with receiving casirivimab\imdevimab  infusion and will be provided a copy of the Fact sheet prior to receiving the infusion.   Nemiah Commander, NP 09/25/2019 9:09 PM

## 2019-09-26 ENCOUNTER — Ambulatory Visit (HOSPITAL_COMMUNITY)
Admission: RE | Admit: 2019-09-26 | Discharge: 2019-09-26 | Disposition: A | Discharge status: Home / Self Care | Payer: Medicare Other | Source: Ambulatory Visit | Attending: Pulmonary Disease | Admitting: Pulmonary Disease

## 2019-09-26 ENCOUNTER — Telehealth: Payer: Self-pay

## 2019-09-26 ENCOUNTER — Encounter: Payer: Self-pay | Admitting: Family Medicine

## 2019-09-26 ENCOUNTER — Telehealth: Payer: Medicare Other | Admitting: Family Medicine

## 2019-09-26 ENCOUNTER — Other Ambulatory Visit: Payer: Self-pay

## 2019-09-26 VITALS — Ht 66.0 in

## 2019-09-26 DIAGNOSIS — Z23 Encounter for immunization: Secondary | ICD-10-CM | POA: Insufficient documentation

## 2019-09-26 DIAGNOSIS — U071 COVID-19: Secondary | ICD-10-CM | POA: Insufficient documentation

## 2019-09-26 MED ORDER — DIPHENHYDRAMINE HCL 50 MG/ML IJ SOLN: 50.0000 mg | Freq: Once | INTRAMUSCULAR | Status: DC | PRN

## 2019-09-26 MED ORDER — ALBUTEROL SULFATE HFA 108 (90 BASE) MCG/ACT IN AERS: 2.0000 | INHALATION_SPRAY | Freq: Once | RESPIRATORY_TRACT | Status: DC | PRN

## 2019-09-26 MED ORDER — METHYLPREDNISOLONE SODIUM SUCC 125 MG IJ SOLR: 125.0000 mg | Freq: Once | INTRAMUSCULAR | Status: DC | PRN

## 2019-09-26 MED ORDER — FAMOTIDINE IN NACL 20-0.9 MG/50ML-% IV SOLN: 20.0000 mg | Freq: Once | INTRAVENOUS | Status: DC | PRN

## 2019-09-26 MED ORDER — SODIUM CHLORIDE 0.9 % IV SOLN
1200.0000 mg | Freq: Once | INTRAVENOUS | Status: AC
  Administered 2019-09-26: 1200 mg via INTRAVENOUS

## 2019-09-26 MED ORDER — EPINEPHRINE 0.3 MG/0.3ML IJ SOAJ: 0.3000 mg | Freq: Once | INTRAMUSCULAR | Status: DC | PRN

## 2019-09-26 MED ORDER — SODIUM CHLORIDE 0.9 % IV SOLN: INTRAVENOUS | Status: DC | PRN

## 2019-09-26 NOTE — Progress Notes (Signed)
  Diagnosis: COVID-19  Physician: Dr. Joya Gaskins  Procedure: [facility] Med: casirivimab\imdevimab infusion - Provided patient with casirivimab\imdevimab fact sheet for patients, parents and caregivers prior to infusion.  Complications: No immediate complications noted.  Discharge: Discharged home   Jessica Miles 09/26/2019

## 2019-09-26 NOTE — Telephone Encounter (Signed)
Noted  

## 2019-09-26 NOTE — Telephone Encounter (Signed)
[facility] Night - Client Nonclinical Telephone Record [facility] Client [facility] Night - Client Client Site [facility] [place] - Night Physician Eliezer Lofts - MD Contact Type Call Who Is Calling Patient / Member / Family / Caregiver Caller Name Yeily Link Caller Phone Number [phone] Patient Name Jessica Miles Patient DOB 1945/02/29 Call Type Message Only Information Provided Reason for Call Request to [facility] Appointment Initial Comment Caller states she has a phone appt at 1040am today, another nurse set up appt for infusion at [facility], and needing to cancel this appt. Additional Comment Provided information for the office. Disp. Time Disposition Final User 09/26/2019 8:10:22 AM General Information Provided Yes Rosana Fret Call Closed By: Rosana Fret Transaction Date/Time: 09/26/2019 8:08:41 AM (ET)

## 2019-09-26 NOTE — Telephone Encounter (Signed)
Patient cancelled VV given MAB infusion set up via Phone nurse.

## 2019-09-26 NOTE — Discharge Instructions (Signed)

## 2019-09-26 NOTE — Telephone Encounter (Signed)
Just got access to access nurse portal. FYI to [facility] CMA.

## 2019-10-02 ENCOUNTER — Telehealth: Payer: Self-pay | Admitting: Family Medicine

## 2019-10-02 DIAGNOSIS — E039 Hypothyroidism, unspecified: Secondary | ICD-10-CM

## 2019-10-02 DIAGNOSIS — Z13220 Encounter for screening for lipoid disorders: Secondary | ICD-10-CM

## 2019-10-02 NOTE — Telephone Encounter (Signed)
-----   Message from Ellamae Sia sent at 09/19/2019  2:27 PM EDT ----- Regarding: Lab orders for Thursday, 9.23.21 Patient is scheduled for CPX labs, please order future labs, Thanks , Karna Christmas

## 2019-10-03 ENCOUNTER — Other Ambulatory Visit: Payer: Self-pay

## 2019-10-03 ENCOUNTER — Other Ambulatory Visit (INDEPENDENT_AMBULATORY_CARE_PROVIDER_SITE_OTHER): Payer: Medicare Other

## 2019-10-03 DIAGNOSIS — E039 Hypothyroidism, unspecified: Secondary | ICD-10-CM | POA: Diagnosis not present

## 2019-10-03 DIAGNOSIS — Z13220 Encounter for screening for lipoid disorders: Secondary | ICD-10-CM | POA: Diagnosis not present

## 2019-10-03 LAB — COMPREHENSIVE METABOLIC PANEL
ALT: 24 U/L (ref 0–35)
AST: 28 U/L (ref 0–37)
Albumin: 3.8 g/dL (ref 3.5–5.2)
Alkaline Phosphatase: 91 U/L (ref 39–117)
BUN: 20 mg/dL (ref 6–23)
CO2: 29 mEq/L (ref 19–32)
Calcium: 9.2 mg/dL (ref 8.4–10.5)
Chloride: 103 mEq/L (ref 96–112)
Creatinine, Ser: 0.95 mg/dL (ref 0.40–1.20)
GFR: 57.24 mL/min — ABNORMAL LOW (ref >60.00)
Glucose, Bld: 90 mg/dL (ref 70–99)
Potassium: 4.5 mEq/L (ref 3.5–5.1)
Sodium: 140 mEq/L (ref 135–145)
Total Bilirubin: 0.5 mg/dL (ref 0.2–1.2)
Total Protein: 6.5 g/dL (ref 6.0–8.3)

## 2019-10-03 LAB — LIPID PANEL
Cholesterol: 151 mg/dL (ref 0–200)
HDL: 52.2 mg/dL (ref >39.00)
LDL Cholesterol: 83 mg/dL (ref 0–99)
NonHDL: 98.92
Total CHOL/HDL Ratio: 3
Triglycerides: 80 mg/dL (ref 0.0–149.0)
VLDL: 16 mg/dL (ref 0.0–40.0)

## 2019-10-03 LAB — T3, FREE: T3, Free: 2.3 pg/mL (ref 2.3–4.2)

## 2019-10-03 LAB — TSH: TSH: 0.94 u[IU]/mL (ref 0.35–4.50)

## 2019-10-03 LAB — T4, FREE: Free T4: 1.24 ng/dL (ref 0.60–1.60)

## 2019-10-03 NOTE — Progress Notes (Signed)
No critical labs need to be addressed urgently. We will discuss labs in detail at upcoming office visit.   

## 2019-10-04 MED ORDER — LEVOTHYROXINE SODIUM 100 MCG PO TABS
100.0000 ug | ORAL_TABLET | Freq: Every day | ORAL | 0 refills | Status: DC
Start: 2019-10-04 — End: 2019-10-07

## 2019-10-04 NOTE — Telephone Encounter (Signed)
[facility].

## 2019-10-04 NOTE — Telephone Encounter (Signed)
Okay to send in refill for synthroid # 90, 3 RF.

## 2019-10-07 ENCOUNTER — Ambulatory Visit: Payer: Medicare Other

## 2019-10-07 MED ORDER — LEVOTHYROXINE SODIUM 100 MCG PO TABS: 100.0000 ug | ORAL_TABLET | Freq: Every day | ORAL | 3 refills | Status: DC

## 2019-10-07 NOTE — Addendum Note (Signed)
Addended by: Carter Kitten on: 10/07/2019 08:31 AM   Modules accepted: Orders

## 2019-10-10 ENCOUNTER — Encounter: Payer: Self-pay | Admitting: Family Medicine

## 2019-10-10 ENCOUNTER — Other Ambulatory Visit: Payer: Self-pay

## 2019-10-10 ENCOUNTER — Ambulatory Visit (INDEPENDENT_AMBULATORY_CARE_PROVIDER_SITE_OTHER): Payer: Medicare Other | Admitting: Family Medicine

## 2019-10-10 VITALS — BP 90/60 | HR 79 | Temp 97.6°F | Ht 65.75 in | Wt 101.8 lb

## 2019-10-10 DIAGNOSIS — F331 Major depressive disorder, recurrent, moderate: Secondary | ICD-10-CM | POA: Diagnosis not present

## 2019-10-10 DIAGNOSIS — E039 Hypothyroidism, unspecified: Secondary | ICD-10-CM | POA: Diagnosis not present

## 2019-10-10 DIAGNOSIS — Z0000 Encounter for general adult medical examination without abnormal findings: Secondary | ICD-10-CM | POA: Diagnosis not present

## 2019-10-10 DIAGNOSIS — Z23 Encounter for immunization: Secondary | ICD-10-CM

## 2019-10-10 DIAGNOSIS — I471 Supraventricular tachycardia: Secondary | ICD-10-CM

## 2019-10-10 NOTE — Assessment & Plan Note (Signed)
Using altenolol as needed.

## 2019-10-10 NOTE — Assessment & Plan Note (Signed)
Well controlled. Continue current medication.  

## 2019-10-10 NOTE — Progress Notes (Signed)
Chief Complaint  Patient presents with  . Medicare Wellness    History of Present Illness: HPI  The patient presents for annual medicare wellness, complete physical and review of chronic health problems. He/She also has the following acute concerns today:  I have personally reviewed the Medicare Annual Wellness questionnaire and have noted 1. The patient's medical and social history 2. Their use of alcohol, tobacco or illicit drugs 3. Their current medications and supplements 4. The patient's functional ability including ADL's, fall risks, home safety risks and hearing or visual             impairment. 5. Diet and physical activities 6. Evidence for depression or mood disorders 7.         Updated provider list Cognitive evaluation was performed and recorded on pt medicare questionnaire form. The patients weight, height, BMI and visual acuity have been recorded in the chart  I have made referrals, counseling and provided education to the patient based review of the above and I have provided the pt with a written personalized care plan for preventive services.   Documentation of this information was scanned into the electronic record under the media tab.   Advance directives and end of life planning reviewed in detail with patient and documented in EMR. Patient given handout on advance care directives if needed. HCPOA and living will updated if needed.   Hearing Screening   Method: Audiometry   125Hz  250Hz  500Hz  1000Hz  2000Hz  3000Hz  4000Hz  6000Hz  8000Hz   Right ear:   20 0 20  20    Left ear:   20 0 20  25      Visual Acuity Screening   Right eye Left eye Both eyes  Without correction:     With correction: 20/20 20/20 20/20      Office Visit from 10/10/2019 in [facility] [place]  PHQ-2 Total Score 1      Fall Risk  10/10/2019 10/02/2018 08/31/2017 09/27/2016 11/11/2015  Falls in the past year? 0 0 No No Yes  Number falls in past yr: - - - - 1  Injury with Fall? -  - - - Yes  Comment - - - - fx foot  Risk for fall due to : - - - - Other (Comment)  Risk for fall due to: Comment - - - - fell chasing animal    She has no further symptoms of COVID..S/P  MAB infusion.  Biked 22 miles yesterday.  Hypothyroid: on levo stable TSH.  Lab Results  Component Value Date   TSH 0.94 10/03/2019    MDD:  Stable control on bupropion.. no longer using amiitriptyline.  SVT: Stable control on tenormin prn.  The 10-year ASCVD risk score Mikey Bussing DC Brooke Bonito., et al., 2013) is: 8.2%   Values used to calculate the score:     Age: 4 years     Sex: Female     Is Non-Hispanic African American: No     Diabetic: No     Tobacco smoker: No     Systolic Blood Pressure: 90 mmHg     Is BP treated: No     HDL Cholesterol: 52.2 mg/dL     Total Cholesterol: 151 mg/dL   This visit occurred during the SARS-CoV-2 public health emergency.  Safety protocols were in place, including screening questions prior to the visit, additional usage of staff PPE, and extensive cleaning of exam room while observing appropriate contact time as indicated for disinfecting solutions.   COVID 19  screen:  No recent travel or known exposure to [place] The patient denies respiratory symptoms of COVID 19 at this time. The importance of social distancing was discussed today.     Review of Systems  Constitutional: Negative for chills and fever.  HENT: Negative for congestion and ear pain.   Eyes: Negative for pain and redness.  Respiratory: Negative for cough and shortness of breath.   Cardiovascular: Negative for chest pain, palpitations and leg swelling.  Gastrointestinal: Negative for abdominal pain, blood in stool, constipation, diarrhea, nausea and vomiting.  Genitourinary: Negative for dysuria.  Musculoskeletal: Negative for falls and myalgias.  Skin: Negative for rash.  Neurological: Negative for dizziness.  Psychiatric/Behavioral: Negative for depression. The patient is not nervous/anxious.        Past Medical History:  Diagnosis Date  . Anxiety   . History of IBS   . HPV (human papilloma virus) infection   . Loss of taste   . Neuropathy 2015   surgery for Mortons neuroma in 1985  . Osteopenia   . Tachycardia   . Tachycardia   . Thyroid disease     reports that she has never smoked. She has never used smokeless tobacco. She reports current alcohol use. She reports that she does not use drugs.   Current Outpatient Medications:  .  amitriptyline (ELAVIL) 25 MG tablet, Take 1 tablet (25 mg total) by mouth at bedtime., Disp: 30 tablet, Rfl: 11 .  ascorbic acid (VITAMIN C) 500 MG tablet, Take 500 mg by mouth 2 (two) times daily. , Disp: , Rfl:  .  atenolol (TENORMIN) 25 MG tablet, Take 25 mg by mouth daily as needed., Disp: , Rfl:  .  buPROPion (WELLBUTRIN SR) 100 MG 12 hr tablet, Take 1 tablet (100 mg total) by mouth 2 (two) times daily., Disp: 180 tablet, Rfl: 1 .  Calcium Carb-Cholecalciferol (CALCIUM-VITAMIN D) 600-400 MG-UNIT TABS, Take 1 tablet by mouth 2 (two) times daily., Disp: , Rfl:  .  hydrocortisone-pramoxine (ANALPRAM-HC) 2.5-1 % rectal cream, Place rectally 3 (three) times daily., Disp: 30 g, Rfl: 0 .  levothyroxine (SYNTHROID) 100 MCG tablet, Take 1 tablet (100 mcg total) by mouth daily., Disp: 90 tablet, Rfl: 3 .  nitroGLYCERIN (NITRO-DUR) 0.2 mg/hr patch, Place 1 patch (0.2 mg total) onto the skin daily., Disp: 30 patch, Rfl: 12 .  XIIDRA 5 % SOLN, , Disp: , Rfl:    Patient Care Team: Jinny Sanders, MD as PCP - General (Family Medicine) [place], Romilda Garret, DPM as Consulting Physician (Podiatry) Penumalli, Earlean Polka, MD as Consulting Physician (Neurology)   Observations/Objective: Blood pressure 90/60, pulse 79, temperature 97.6 F (36.4 C), temperature source Temporal, height 5' 5.75" (1.67 m), weight 101 lb 12 oz (46.2 kg), SpO2 98 %.  Physical Exam Constitutional:      General: She is not in acute distress.    Appearance: Normal appearance. She is  well-developed. She is not ill-appearing or toxic-appearing.     Comments: Thin appearing but not cachetic.. healthy, thin and muscular  HENT:     Head: Normocephalic.     Right Ear: Hearing, tympanic membrane, ear canal and external ear normal.     Left Ear: Hearing, tympanic membrane, ear canal and external ear normal.     Nose: Nose normal.  Eyes:     General: Lids are normal. Lids are everted, no foreign bodies appreciated.     Conjunctiva/sclera: Conjunctivae normal.     Pupils: Pupils are equal, round, and reactive to  light.  Neck:     Thyroid: No thyroid mass or thyromegaly.     Vascular: No carotid bruit.     Trachea: Trachea normal.  Cardiovascular:     Rate and Rhythm: Normal rate and regular rhythm.     Heart sounds: Normal heart sounds, S1 normal and S2 normal. No murmur heard.  No gallop.   Pulmonary:     Effort: Pulmonary effort is normal. No respiratory distress.     Breath sounds: Normal breath sounds. No wheezing, rhonchi or rales.  Abdominal:     General: Bowel sounds are normal. There is no distension or abdominal bruit.     Palpations: Abdomen is soft. There is no fluid wave or mass.     Tenderness: There is no abdominal tenderness. There is no guarding or rebound.     Hernia: No hernia is present.  Musculoskeletal:     Cervical back: Normal range of motion and neck supple.  Lymphadenopathy:     Cervical: No cervical adenopathy.  Skin:    General: Skin is warm and dry.     Findings: No rash.  Neurological:     Mental Status: She is alert.     Cranial Nerves: No cranial nerve deficit.     Sensory: No sensory deficit.  Psychiatric:        Mood and Affect: Mood is not anxious or depressed.        Speech: Speech normal.        Behavior: Behavior normal. Behavior is cooperative.        Judgment: Judgment normal.      Assessment and Plan   The patient's preventative maintenance and recommended screening tests for an annual wellness exam were reviewed in  full today. Brought up to date unless services declined.  Counselled on the importance of diet, exercise, and its role in overall health and mortality. The patient's FH and SH was reviewed, including their home life, tobacco status, and drug and alcohol status.   Vaccines: uptodate with tdap, given flu today, S/P COVID vaccine series. Plans booster Pap/DVE: not indicated Mammo: 2018 nml Bone Density: 2015 osteopenia... she refuses re-eval.. as not interested I treating. Colon:2019, no further indicated  Smoking Status:none ETOH/ drug JAS:NKNL/ZJQB Hep C: neg   Eliezer Lofts, MD

## 2019-10-10 NOTE — Patient Instructions (Signed)
Keep up the healthy lifestyle. Work on getting HDL back up!

## 2019-10-11 ENCOUNTER — Other Ambulatory Visit: Payer: Self-pay | Admitting: Family Medicine

## 2019-10-11 DIAGNOSIS — E2839 Other primary ovarian failure: Secondary | ICD-10-CM

## 2019-10-11 DIAGNOSIS — M8580 Other specified disorders of bone density and structure, unspecified site: Secondary | ICD-10-CM

## 2019-10-14 NOTE — Telephone Encounter (Signed)
LM for a call back

## 2019-10-16 NOTE — Progress Notes (Signed)
cancelled

## 2019-10-18 LAB — HM DEXA SCAN

## 2019-10-18 LAB — HM MAMMOGRAPHY

## 2019-10-23 ENCOUNTER — Encounter: Payer: Self-pay | Admitting: Family Medicine

## 2019-11-11 MED ORDER — HYDROCORT-PRAMOXINE (PERIANAL) 2.5-1 % EX CREA
TOPICAL_CREAM | Freq: Three times a day (TID) | CUTANEOUS | 0 refills | Status: DC
Start: 2019-11-11 — End: 2019-12-13

## 2019-11-12 ENCOUNTER — Encounter: Payer: Self-pay | Admitting: Family Medicine

## 2019-11-13 ENCOUNTER — Encounter: Payer: Self-pay | Admitting: Family Medicine

## 2019-12-02 ENCOUNTER — Encounter: Payer: Self-pay | Admitting: Primary Care

## 2019-12-02 ENCOUNTER — Telehealth: Payer: Self-pay

## 2019-12-02 NOTE — Telephone Encounter (Signed)
Per appt notes pt has appt with Gentry Fitz NP on 12/03/19 at 8 AM.

## 2019-12-02 NOTE — Telephone Encounter (Signed)
[facility] - Client TELEPHONE ADVICE RECORD [facility] Patient Name: Jessica Miles Gender: Female DOB: 01-04-1946 Age: 74 Y 10 M 1 D Return Phone Number: [phone] (Primary) Address: City/State/Zip: [place] [address] Client [facility] - Client Client Site [place] - Day Physician Eliezer Lofts - MD Contact Type Call Who Is Calling Patient / Member / Family / Caregiver Call Type Triage / Clinical Relationship To Patient Self Return Phone Number [phone] (Primary) Chief Complaint ABDOMINAL PAIN - Severe and only in abdomen Reason for Call Symptomatic / Request for [name] states she is having severe on and off abdominal pains. Translation No Nurse Assessment Nurse: Zorita Pang, RN, Deborah Date/Time (Eastern Time): 12/02/2019 9:53:16 AM Confirm and document reason for call. If symptomatic, describe symptoms. ---The caller states that she is having left lower quadrant pain that is intermittent. Has history of IBS. She states that she can feel stool as it goes through that area. Is taking a probiotic and nothing is changing. No change in her bowel movements. Was told that her colon was torturous. Does the patient have any new or worsening symptoms? ---Yes Will a triage be completed? ---Yes Related visit to physician within the last 2 weeks? ---No Does the PT have any chronic conditions? (i.e. diabetes, asthma, this includes High risk factors for pregnancy, etc.) ---Yes List chronic conditions. ---IBS, hypothyroid, anxiety, ventricular tachycardia Is this a behavioral health or substance abuse call? ---No Guidelines Guideline Title Affirmed Question Affirmed Notes Nurse Date/Time (Eastern Time) Abdominal Pain - Female [1] MODERATE pain (e.g., interferes with normal activities) AND [2] pain comes and goes (cramps) AND [3] present > 24 hours (Exception: pain  with Vomiting or Diarrhea - see that Guideline) Womble, RN, Neoma Laming 12/02/2019 9:57:36 AM PLEASE NOTE: All timestamps contained within this report are represented as [place] Standard Time. CONFIDENTIALTY NOTICE: This fax transmission is intended only for the addressee. It contains information that is legally privileged, confidential or otherwise protected from use or disclosure. If you are not the intended recipient, you are strictly prohibited from reviewing, disclosing, copying using or disseminating any of this information or taking any action in reliance on or regarding this information. If you have received this fax in error, please notify us immediately by telephone so that we can arrange for its return to [place]. Phone: [phone], Toll-Free: [phone], Fax: [phone] Page: 2 of 2 Call Id: [id number] [place]. Time Eilene Ghazi Time) Disposition Final User 12/02/2019 9:50:47 AM Send to Urgent Roetta Sessions, Tamika 12/02/2019 10:04:23 AM See PCP within 24 Hours Yes Zorita Pang, RN, Garrel Ridgel Disagree/Comply Comply Caller Understands Yes PreDisposition Call Doctor Care Advice Given Per Guideline SEE PCP WITHIN 24 HOURS: * Drink adequate fluids. Eat a bland diet. * Avoid alcohol or caffeinated beverages * You become worse Referrals REFERRED TO PCP OFFICE

## 2019-12-02 NOTE — Telephone Encounter (Signed)
Noted, will evaluate. 

## 2019-12-03 ENCOUNTER — Other Ambulatory Visit: Payer: Self-pay

## 2019-12-03 ENCOUNTER — Ambulatory Visit (INDEPENDENT_AMBULATORY_CARE_PROVIDER_SITE_OTHER): Payer: Medicare Other | Admitting: Primary Care

## 2019-12-03 VITALS — BP 100/58 | HR 97 | Temp 97.2°F | Ht 67.5 in | Wt 101.0 lb

## 2019-12-03 DIAGNOSIS — R1032 Left lower quadrant pain: Secondary | ICD-10-CM | POA: Diagnosis not present

## 2019-12-03 LAB — CBC WITH DIFFERENTIAL/PLATELET
Basophils Absolute: 0.1 10*3/uL (ref 0.0–0.1)
Basophils Relative: 1.3 % (ref 0.0–3.0)
Eosinophils Absolute: 0.1 10*3/uL (ref 0.0–0.7)
Eosinophils Relative: 2 % (ref 0.0–5.0)
HCT: 41.4 % (ref 36.0–46.0)
Hemoglobin: 14.1 g/dL (ref 12.0–15.0)
Lymphocytes Relative: 29.3 % (ref 12.0–46.0)
Lymphs Abs: 1.4 10*3/uL (ref 0.7–4.0)
MCHC: 34 g/dL (ref 30.0–36.0)
MCV: 92.6 fl (ref 78.0–100.0)
Monocytes Absolute: 0.4 10*3/uL (ref 0.1–1.0)
Monocytes Relative: 8.1 % (ref 3.0–12.0)
Neutro Abs: 2.8 10*3/uL (ref 1.4–7.7)
Neutrophils Relative %: 59.3 % (ref 43.0–77.0)
Platelets: 206 10*3/uL (ref 150.0–400.0)
RBC: 4.48 Mil/uL (ref 3.87–5.11)
RDW: 13.4 % (ref 11.5–15.5)
WBC: 4.7 10*3/uL (ref 4.0–10.5)

## 2019-12-03 LAB — COMPREHENSIVE METABOLIC PANEL
ALT: 30 U/L (ref 0–35)
AST: 30 U/L (ref 0–37)
Albumin: 4.3 g/dL (ref 3.5–5.2)
Alkaline Phosphatase: 85 U/L (ref 39–117)
BUN: 23 mg/dL (ref 6–23)
CO2: 30 mEq/L (ref 19–32)
Calcium: 9.3 mg/dL (ref 8.4–10.5)
Chloride: 102 mEq/L (ref 96–112)
Creatinine, Ser: 0.9 mg/dL (ref 0.40–1.20)
GFR: 62.39 mL/min (ref >60.00)
Glucose, Bld: 91 mg/dL (ref 70–99)
Potassium: 4 mEq/L (ref 3.5–5.1)
Sodium: 139 mEq/L (ref 135–145)
Total Bilirubin: 0.6 mg/dL (ref 0.2–1.2)
Total Protein: 7.1 g/dL (ref 6.0–8.3)

## 2019-12-03 NOTE — Patient Instructions (Signed)
Stop by the lab prior to leaving today. I will notify you of your results once received.   Continue to work on your [employer].   Avoid spicy, fatty, fried foods. Avoid nuts/seeds.  Increase water intake.  Please update me if your pain progresses, you develop diarrhea, see blood in the stools.  It was a pleasure meeting you!

## 2019-12-03 NOTE — Progress Notes (Signed)
Subjective:    Patient ID: Jessica Miles, female    DOB: 06-Aug-1945, 74 y.o.   MRN: [id number]  HPI  This visit occurred during the SARS-CoV-2 public health emergency.  Safety protocols were in place, including screening questions prior to the visit, additional usage of staff PPE, and extensive cleaning of exam room while observing appropriate contact time as indicated for disinfecting solutions.   Jessica Miles is a 74 year old female patient of Dr. 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  One month ago she started noticing increased gas after she had been off of her typical FODMAP diet. She was off of her FODMAP diet as she had been recovering from Covid-19, eating food that she typically never ate. She's also noticed alterations in bowel movements with increased frequency of stools, soft mostly.   One week ago she began to notice LLQ abdominal pain when she feels the stool/gas pass through the left lower descending and sigmoid colon. The pain will last for a few minutes and will abate once she has evacuated.   She changed her diet back to FODMAP one month ago, but endorses increased intake of almonds and nuts over the last several days. She's also resumed a Probiotic about 8 days ago. She admits to little water intake.  She's had no bowel movement yesterday or today and feels well. Denies pain today, bloody stools, nausea, vomiting, chills, urinary symptoms.   Her last colonoscopy was in 2019 per [facility] which revealed "tortious colon" and hemorrhoids, otherwise negative. She does have evidence of few diverticula to the rectosigmoid colon noted on CT scan from 2012, denies a history of diverticulitis.   Review of Systems  Constitutional: Negative for fever.  Gastrointestinal: Positive for abdominal pain. Negative for blood in stool, constipation, diarrhea,  nausea and vomiting.  Genitourinary: Negative for dysuria and frequency.       Past Medical History:  Diagnosis Date   Anxiety    History of IBS    HPV (human papilloma virus) infection    Loss of taste    Neuropathy 2015   surgery for Mortons neuroma in 1985   Osteopenia    Tachycardia    Tachycardia    Thyroid disease      Social History   Socioeconomic History   Marital status: Married    Spouse Miles: Juanda Crumble B. Budzinski   Number of children: 2   Years of education: MA   Highest education level: Not on file  Occupational History   Occupation: Retired    Comment: Music therapist  Tobacco Use   Smoking status: Never Smoker   Smokeless tobacco: Never Used  Substance and Sexual Activity   Alcohol use: Yes    Comment: 2-3 glasses per week   Drug use: No   Sexual activity: Not Currently  Other Topics Concern   Not on file  Social History Narrative   Patient lives at [place] with spouse.   Caffeine Use: 1 cup daily   2 kids- 4 grandchildren- two boys and two girls      Social Determinants of Health   Financial Resource Strain:    Difficulty of Paying Living Expenses: Not on file  Food Insecurity:    Worried About Charity fundraiser in the Last Year: Not on file   [employer] of Food in the Last Year: Not on file  Transportation Needs:    Film/video editor (Medical): Not on file   Lack of Transportation (Non-Medical): Not on file  Physical Activity:    Days of Exercise per Week: Not on file   Minutes of Exercise per Session: Not on file  Stress:    Feeling of Stress : Not on file  Social Connections:    Frequency of Communication with Friends and Family: Not on file   Frequency of Social Gatherings with Friends and Family: Not on file   Attends Religious Services: Not on file   Active Member of Clubs or Organizations: Not on file   Attends Archivist Meetings: Not on file   Marital Status: Not on file  Intimate  Partner Violence:    Fear of Current or Ex-Partner: Not on file   Emotionally Abused: Not on file   Physically Abused: Not on file   Sexually Abused: Not on file    Past Surgical History:  Procedure Laterality Date   FOOT NEUROMA SURGERY  1985   WISDOM TOOTH EXTRACTION      Family History  Problem Relation Age of Onset   Heart failure Mother    Heart attack Mother    Heart disease Mother    Heart attack Father    Heart disease Father    Heart attack Maternal Grandfather    Diabetes Son        IDDM   Heart disease Maternal Grandmother    Thyroid disease Neg Hx     Allergies  Allergen Reactions   Fluticasone Other (See Comments)    Nose bleeds   Mometasone Other (See Comments)    Epistaxis   Fexofenadine Palpitations    Current Outpatient Medications on File Prior to Visit  Medication Sig Dispense Refill   ascorbic acid (VITAMIN C) 500 MG tablet Take 500 mg by mouth 2 (two) times daily.      atenolol (TENORMIN) 25 MG tablet Take 25 mg by mouth daily as needed.     buPROPion (WELLBUTRIN SR) 100 MG 12 hr tablet TAKE 1 TABLET TWICE A DAY 180 tablet 3   Calcium Carb-Cholecalciferol (CALCIUM-VITAMIN D) 600-400 MG-UNIT TABS Take 1 tablet by mouth 2 (two) times daily.     levothyroxine (SYNTHROID) 100 MCG tablet Take 1 tablet (100 mcg total) by mouth daily. 90 tablet 3   nitroGLYCERIN (NITRO-DUR) 0.2 mg/hr patch Place 1 patch (0.2 mg total) onto the skin daily. 30 patch 12   XIIDRA 5 % SOLN      hydrocortisone-pramoxine (ANALPRAM-HC) 2.5-1 % rectal cream Place rectally 3 (three) times daily. (Patient not taking: Reported on 12/02/2019) 30 g 0   No current facility-administered medications on file prior to visit.    BP (!) 100/58    Pulse 97    Temp (!) 97.2 F (36.2 C) (Temporal)    Ht 5' 7.5" (1.715 m)    Wt 101 lb (45.8 kg)    SpO2 97%    BMI 15.59 kg/m    Objective:   Physical Exam Constitutional:      General: She is not in acute  distress.    Appearance: She is not ill-appearing.  Abdominal:     General: Abdomen is flat. Bowel sounds are normal.     Palpations: Abdomen is soft.     Tenderness: There is no abdominal tenderness.  Skin:    General: Skin is warm and dry.  Neurological:     Mental Status: She is alert.  Assessment & Plan:

## 2019-12-03 NOTE — Assessment & Plan Note (Signed)
Acute for the last week, no pain yesterday or today. Today she appears well, not sickly. Exam today unremarkable.   She does have diverticula to the rectosigmoid colon, but she does not exhibit symptoms of acute diverticulitis.   Recommended to continue FODMAP diet, avoid nuts/seeds, increase water intake, continue probiotic.   Check CBC and CMP today.  Return precautions provided.

## 2019-12-10 ENCOUNTER — Other Ambulatory Visit: Payer: Self-pay | Admitting: Family Medicine

## 2019-12-13 ENCOUNTER — Ambulatory Visit (INDEPENDENT_AMBULATORY_CARE_PROVIDER_SITE_OTHER): Payer: Medicare Other | Admitting: Family Medicine

## 2019-12-13 ENCOUNTER — Other Ambulatory Visit: Payer: Self-pay

## 2019-12-13 ENCOUNTER — Encounter: Payer: Self-pay | Admitting: Family Medicine

## 2019-12-13 VITALS — BP 100/68 | HR 75 | Temp 97.5°F | Ht 67.5 in | Wt 106.2 lb

## 2019-12-13 DIAGNOSIS — R1032 Left lower quadrant pain: Secondary | ICD-10-CM | POA: Diagnosis not present

## 2019-12-13 NOTE — Progress Notes (Signed)
Chief Complaint  Patient presents with   Follow-up    LLQ Pain    History of Present Illness: HPI   74 year old female patient with history of  IBS and diverticulosis presetns with continued LLQ abdominal pain.  Pain started 1.5 months ago after she stopped FODMAP diet, recovering from COVID, eating atypical foods for her. ( Had been having nuts)  Stool was soft and mushy.   She was seen on 12/03/2019 by Allie Bossier for similar pain.  Reported intermittent pain, nontoxic, no fever, sharp in quality. She was treated as IBS flare with FODMAP diet, increase water and probiotic.  CBC and CMET:  normal  Her last colonoscopy was in 2019 per [facility] which revealed "tortious colon" and hemorrhoids, otherwise negative. She does have evidence of few diverticula to the rectosigmoid colon noted on CT scan from 2012, denies a history of diverticulitis.   Today she reports is continuing to have pain in LLQ... less sharp, less severe.  More generalized ache, occurring when stool moving or gas.  She has increased gas. Mushy looser stool, increase frequency. Change in smell of stool.    This visit occurred during the SARS-CoV-2 public health emergency.  Safety protocols were in place, including screening questions prior to the visit, additional usage of staff PPE, and extensive cleaning of exam room while observing appropriate contact time as indicated for disinfecting solutions.   COVID 19 screen:  No recent travel or known exposure to COVID19 The patient denies respiratory symptoms of COVID 19 at this time. The importance of social distancing was discussed today.     Review of Systems  Constitutional: Negative for chills and fever.  HENT: Negative for congestion and ear pain.   Eyes: Negative for pain and redness.  Respiratory: Negative for cough and shortness of breath.   Cardiovascular: Negative for chest pain, palpitations and leg swelling.  Gastrointestinal: Negative for abdominal pain,  blood in stool, constipation, diarrhea, nausea and vomiting.  Genitourinary: Negative for dysuria.  Musculoskeletal: Negative for falls and myalgias.  Skin: Negative for rash.  Neurological: Negative for dizziness.  Psychiatric/Behavioral: Negative for depression. The patient is not nervous/anxious.       Past Medical History:  Diagnosis Date   Anxiety    History of IBS    HPV (human papilloma virus) infection    Loss of taste    Neuropathy 2015   surgery for Mortons neuroma in 1985   Osteopenia    Tachycardia    Tachycardia    Thyroid disease     reports that she has never smoked. She has never used smokeless tobacco. She reports current alcohol use. She reports that she does not use drugs.   Current Outpatient Medications:    ascorbic acid (VITAMIN C) 500 MG tablet, Take 500 mg by mouth 2 (two) times daily. , Disp: , Rfl:    atenolol (TENORMIN) 25 MG tablet, Take 25 mg by mouth daily as needed., Disp: , Rfl:    buPROPion (WELLBUTRIN SR) 100 MG 12 hr tablet, TAKE 1 TABLET TWICE A DAY, Disp: 180 tablet, Rfl: 3   Calcium Carb-Cholecalciferol (CALCIUM-VITAMIN D) 600-400 MG-UNIT TABS, Take 1 tablet by mouth 2 (two) times daily., Disp: , Rfl:    levothyroxine (SYNTHROID) 100 MCG tablet, Take 1 tablet (100 mcg total) by mouth daily., Disp: 90 tablet, Rfl: 3   nitroGLYCERIN (NITRO-DUR) 0.2 mg/hr patch, Place 1 patch (0.2 mg total) onto the skin daily., Disp: 30 patch, Rfl: 12   Shirley Friar  5 % SOLN, , Disp: , Rfl:    Observations/Objective: Blood pressure 100/68, pulse 75, temperature (!) 97.5 F (36.4 C), temperature source Temporal, height 5' 7.5" (1.715 m), weight 106 lb 4 oz (48.2 kg), SpO2 97 %.  Physical Exam Constitutional:      General: She is not in acute distress.    Appearance: Normal appearance. She is well-developed. She is not ill-appearing or toxic-appearing.  HENT:     Head: Normocephalic.     Right Ear: Hearing, tympanic membrane, ear canal and external  ear normal.     Left Ear: Hearing, tympanic membrane, ear canal and external ear normal.     Nose: Nose normal.  Eyes:     General: Lids are normal. Lids are everted, no foreign bodies appreciated.     Conjunctiva/sclera: Conjunctivae normal.     Pupils: Pupils are equal, round, and reactive to light.  Neck:     Thyroid: No thyroid mass or thyromegaly.     Vascular: No carotid bruit.     Trachea: Trachea normal.  Cardiovascular:     Rate and Rhythm: Normal rate and regular rhythm.     Heart sounds: Normal heart sounds, S1 normal and S2 normal. No murmur heard.  No gallop.   Pulmonary:     Effort: Pulmonary effort is normal. No respiratory distress.     Breath sounds: Normal breath sounds. No wheezing, rhonchi or rales.  Abdominal:     General: Bowel sounds are normal. There is no distension or abdominal bruit.     Palpations: Abdomen is soft. There is no fluid wave or mass.     Tenderness: There is abdominal tenderness in the left lower quadrant. There is guarding. There is no right CVA tenderness, left CVA tenderness or rebound.     Hernia: No hernia is present.  Musculoskeletal:     Cervical back: Normal range of motion and neck supple.  Lymphadenopathy:     Cervical: No cervical adenopathy.  Skin:    General: Skin is warm and dry.     Findings: No rash.  Neurological:     Mental Status: She is alert.     Cranial Nerves: No cranial nerve deficit.     Sensory: No sensory deficit.  Psychiatric:        Mood and Affect: Mood is not anxious or depressed.        Speech: Speech normal.        Behavior: Behavior normal. Behavior is cooperative.        Judgment: Judgment normal.      Assessment and Plan   Left lower quadrant abdominal pain Unresolved LLQ pain.Marland Kitchen not better with probiotic and back on FODMAP diet. Not consistent with her IBS No past abdominal surgeries.Marland Kitchen doubt adhesions.  No mass palpated and pt very thin.  Not clearly ischemic mesenteric changes.  Eval with CT  abd pelvis for  Diverticulitis.   Post COVID.Marland Kitchen. symptoms started afterward.     Eliezer Lofts, MD

## 2019-12-13 NOTE — Assessment & Plan Note (Signed)
Unresolved LLQ pain.Marland Kitchen not better with probiotic and back on FODMAP diet. Not consistent with her IBS No past abdominal surgeries.Marland Kitchen doubt adhesions.  No mass palpated and pt very thin.  Not clearly ischemic mesenteric changes.  Eval with CT abd pelvis for  Diverticulitis.   Post COVID.Marland Kitchen. symptoms started afterward.

## 2019-12-13 NOTE — Patient Instructions (Signed)
We will set up CT for you. GO to ER if severe pain or cannot keep down fluids.

## 2019-12-16 ENCOUNTER — Ambulatory Visit
Admission: RE | Admit: 2019-12-16 | Discharge: 2019-12-16 | Disposition: A | Discharge status: Home / Self Care | Payer: Medicare Other | Source: Ambulatory Visit | Attending: Family Medicine | Admitting: Family Medicine

## 2019-12-16 ENCOUNTER — Other Ambulatory Visit: Payer: Self-pay | Admitting: Family Medicine

## 2019-12-16 ENCOUNTER — Other Ambulatory Visit: Payer: Self-pay

## 2019-12-16 DIAGNOSIS — R1032 Left lower quadrant pain: Secondary | ICD-10-CM | POA: Diagnosis not present

## 2019-12-16 MED ORDER — IOHEXOL 300 MG/ML  SOLN
75.0000 mL | Freq: Once | INTRAMUSCULAR | Status: AC | PRN
  Administered 2019-12-16: 75 mL via INTRAVENOUS

## 2019-12-16 MED ORDER — AMOXICILLIN-POT CLAVULANATE 875-125 MG PO TABS: 1.0000 | ORAL_TABLET | Freq: Two times a day (BID) | ORAL | 0 refills | Status: DC

## 2019-12-17 ENCOUNTER — Other Ambulatory Visit: Payer: Self-pay | Admitting: Family Medicine

## 2019-12-17 DIAGNOSIS — R935 Abnormal findings on diagnostic imaging of other abdominal regions, including retroperitoneum: Secondary | ICD-10-CM

## 2019-12-17 DIAGNOSIS — K529 Noninfective gastroenteritis and colitis, unspecified: Secondary | ICD-10-CM

## 2019-12-18 ENCOUNTER — Encounter: Payer: Self-pay | Admitting: Family Medicine

## 2019-12-19 ENCOUNTER — Other Ambulatory Visit: Payer: Self-pay | Admitting: Family Medicine

## 2019-12-19 DIAGNOSIS — I700 Atherosclerosis of aorta: Secondary | ICD-10-CM | POA: Insufficient documentation

## 2019-12-19 MED ORDER — ATORVASTATIN CALCIUM 10 MG PO TABS: 10.0000 mg | ORAL_TABLET | Freq: Every day | ORAL | 11 refills | Status: DC

## 2019-12-23 ENCOUNTER — Encounter: Payer: Self-pay | Admitting: Gastroenterology

## 2019-12-31 ENCOUNTER — Other Ambulatory Visit: Payer: Self-pay | Admitting: Family Medicine

## 2019-12-31 MED ORDER — PREDNISONE 20 MG PO TABS: ORAL_TABLET | ORAL | 0 refills | Status: DC

## 2020-01-06 NOTE — Telephone Encounter (Signed)
I abstracted it on 12/18/2019 and sent it to be scanned in chart.  It does not look like it has been scanned in yet.

## 2020-02-05 ENCOUNTER — Ambulatory Visit (INDEPENDENT_AMBULATORY_CARE_PROVIDER_SITE_OTHER): Payer: Medicare Other | Admitting: Gastroenterology

## 2020-02-05 ENCOUNTER — Telehealth: Payer: Self-pay

## 2020-02-05 ENCOUNTER — Encounter: Payer: Self-pay | Admitting: Gastroenterology

## 2020-02-05 ENCOUNTER — Other Ambulatory Visit (INDEPENDENT_AMBULATORY_CARE_PROVIDER_SITE_OTHER): Payer: Medicare Other

## 2020-02-05 VITALS — BP 118/58 | HR 86 | Ht 67.0 in

## 2020-02-05 DIAGNOSIS — R935 Abnormal findings on diagnostic imaging of other abdominal regions, including retroperitoneum: Secondary | ICD-10-CM | POA: Diagnosis not present

## 2020-02-05 DIAGNOSIS — R194 Change in bowel habit: Secondary | ICD-10-CM | POA: Diagnosis not present

## 2020-02-05 LAB — BUN: BUN: 17 mg/dL (ref 6–23)

## 2020-02-05 LAB — CREATININE, SERUM: Creatinine, Ser: 0.98 mg/dL (ref 0.40–1.20)

## 2020-02-05 NOTE — Telephone Encounter (Signed)
Records Request  Provider and/or Facility: [facility]  Document: Signed ROI  ROI has been faxed successfully to [facility]. Document(s) and fax confirmation(s) have been placed in the faxed file for future reference.

## 2020-02-05 NOTE — Patient Instructions (Addendum)
I am so glad that you are feeling better.    Given your abnormal CT scan, your recent symptoms could have been due to infection, inflammatory bowel disease, or even malignancy.  We discussed both a colonoscopy and a follow-up CT scan as options for further evaluation.  Unfortunately I do not have a way to perform either study without resulting diarrhea.   PROCEDURE:  I am recommending that you have a COLONOSCOPY completed. Per your request, we did not schedule the procedure. When you are ready to proceed with scheduling, please contact our office at [phone].   NOTE: At the time of scheduling your procedure, you will also be scheduled for a pre-visit with our nursing staff. During this appointment, you will be provided with your prep instructions.  RECORDS:  We will request records from [facility].  MRI with MRCP:  You have been scheduled for an MRI at [facility] on Friday, February 11th at 8:20am. Please arrive @ 7:50am for registration.   LOCATION:   [facility], [place]   PREP:   Please DO NOT eat or drink anything 4 hours prior to your test. In addition, if you have any metal in your body, a pacemaker or a defibrillator, please inform your ordering provider. The test typically takes an hour to complete.  NEED TO RESCHEDULE?   Please call radiology at [phone].  WHY ARE YOU HAVING THIS EXAM?  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  LABS:   Your provider has requested that you go to the basement level for lab work before leaving today. Press "B" on the elevator. The lab is located at the first door on the left as you exit the elevator.  HEALTHCARE LAWS AND MY CHART RESULTS: Due to recent changes in healthcare laws, you may see the results of your imaging and laboratory studies on MyChart before your provider has had a chance to review them.  We understand that in some cases there may  be results that are confusing or concerning to you. Not all laboratory results come back in the same time frame and the provider may be waiting for multiple results in order to interpret others.  Please give [place] 48 hours in order for your provider to thoroughly review all the results before contacting the office for clarification of your results.   FOLLOW UP:  Please contact me at any time if you would like to proceed with scheduling either of these studies.  BMI:  If you are age 38 or older, your body mass index should be between 23-30. Your There is no height or weight on file to calculate BMI. If this is out of the aforementioned range listed, please consider follow up with your Primary Care Provider.  It was a pleasure to meet you today.  Thank you for trusting me with your gastrointestinal care!  Please follow-up with me if any of your recent symptoms recur.  Thornton Park, MD, MPH

## 2020-02-05 NOTE — Progress Notes (Signed)
Referring Provider: Jinny Sanders, MD Primary Care Physician:  Jinny Sanders, MD  Reason for Consultation: Abnormal CT   IMPRESSION:  Left lower quadrant pain with altered bowel habits different from her chronic constipation IBS symptoms. CT abd  12/16/2019 with suspected colitis +/- malignancy in the right colon. Symptoms worsened by amoxicillin, but have now improved with FODMAP diet and 7 days of steroids. Recommended colonoscopy to screen for inflammatory bowel disease and malignancy but she declined due to concerns about the prep process disrupting her gut.  Discussed an alternative as a follow-up CT to reassess both colitis and the possible malignancy, but she again declined due to concerns about the contrast altering her gut.  In regards to both the CT and the colonoscopy she noted "If it's cancer, I will deal with cancer when it comes."  Will screen for IBD with fecal calprotectin and obtain a GI stool pathogen panel to look for infectious etiologies.  She will contact me with any symptom recurrence although she noted that she would want to avoid endoscopy or CT even under those circumstances.  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  I do not think this is related to her recent symptomatology.  She agreed to proceed with MRI/MRCP for further evaluation.  Constipation-IBS previously controlled with FODMAP diet.  She will continue her FODMAP diet.  We discussed resuming VSL probiotics.  Tortuous colon on prior colonoscopy in 2019 with Dr. Vira Agar.  We will obtain that prior colonoscopy report.  PLAN: - GI stool pathogen panel, fecal calprotectin - Continue FODMAP diet, consider considering probiotics - Obtain colonoscopy report from the [facility] - Patient declined colonoscopy and follow-up CT scan - MRI/MRCP to evaluate the abnormal pancreatic duct on CT scan - She will contact me if her symptoms return for follow-up  Please see the "Patient  Instructions" section for addition details about the plan.  I spent 60 minutes, including in depth chart review, independent review of results, face-to-face time with the patient, coordinating care, ordering studies and medications as appropriate, and documentation.    HPI: Jessica Miles is a 75 y.o. female referred by Dr. Diona Browner for further evaluation of left lower quadrant pain.  She has anxiety, depression, hypothyroidism, and a history of ventricular tachycardia 20 years ago. She recently had interstitial cystitis treated with Azo. She has a longstanding history of constipation-IBS diagnosed in 2015 and treated with the FODMAP diet, diverticulosis, and hemorrhoids treated with Analpram cream. She was previously followed by Dr. Vira Agar at the [facility]. She is a retired Tourist information centre manager. She brings a written summary of her symptoms.    She had Covid 19 in September with severe symptoms including significant weight loss down to 101 pounds. She had to stop FODMAP while recovering from Covid. In mid-November she developed a sharp, LLQ abdominal pain. She was very aware of stool or gas moving through her intestines.    A CT of the abdomen and pelvis 12/16/2019 shows abnormal wall thickening in the distal transverse colon, descending colon, sigmoid colon, and parts of the ascending colon.  There is some focal wall thickening in the ascending colon worrisome for the possibility of malignancy.  There is also lumbar scoliosis, renal cysts, and a prominent pancreatic duct which appears slightly more prominent than the 2012 exam.  Resumed FODMAP diet, increasing water, and a probiotic (VSL x 3 months). Took amoxicillin and the CT prep but she developed severe diarrhea with mushy, malodorous stools, rectal pain and burning, and  a hemorrhoid flare. She was concerned that she wasn't absorbing anything.    She took a 7 day course of prednisone that completely resolved her symptoms.   Her stool has since returned to  normal. She regained the 10 pounds that she lost during her period of acute symptoms.   Two weeks ago she developed a sense of jitteriness, fluctuating heat and cold intolerance, and a burning sensation in her tongue.   She had a colonoscopy in 2012 for abdominal pain and again in 2019 at the [place] [facility] with Dr. Vira Agar.  It showed a "tortuous colon" and hemorrhoids. A pediatric colonoscope was used. Unfortunately, Dr. Percell Boston colonoscopy report is not available in [place], but, Dr. Diona Browner summarized it in her last clinic note.   Testing in 2017 was negative for celiac.   Labs in November show a normal CMP and CBC.  TSH was normal in September.  A CT scan from 2012 shows diverticulosis.   No known family history of colon cancer or polyps. No family history of uterine/endometrial cancer, pancreatic cancer or gastric/stomach cancer.   Past Medical History:  Diagnosis Date  . Anxiety   . Depression   . History of IBS   . HPV (human papilloma virus) infection   . Loss of taste   . Neuropathy 2015   surgery for Mortons neuroma in 1985  . Osteopenia   . Tachycardia   . Tachycardia   . Thyroid disease     Past Surgical History:  Procedure Laterality Date  . [place]  . WISDOM TOOTH EXTRACTION      Current Outpatient Medications  Medication Sig Dispense Refill  . Alpha-Lipoic Acid 100 MG CAPS Take 1 capsule by mouth daily.    Marland Kitchen ascorbic acid (VITAMIN C) 500 MG tablet Take 500 mg by mouth 2 (two) times daily.     Marland Kitchen atenolol (TENORMIN) 25 MG tablet Take 25 mg by mouth daily as needed.    Marland Kitchen buPROPion (WELLBUTRIN SR) 100 MG 12 hr tablet TAKE 1 TABLET TWICE A DAY 180 tablet 3  . Calcium Carb-Cholecalciferol (CALCIUM-VITAMIN D) 600-400 MG-UNIT TABS Take 1 tablet by mouth daily.    . Cyanocobalamin (VITAMIN B-12 PO) Take 1 tablet by mouth daily.    . hydrocortisone-pramoxine (ANALPRAM-HC) 2.5-1 % rectal cream Place 1 application rectally as needed for hemorrhoids  or anal itching.    . levothyroxine (SYNTHROID) 100 MCG tablet Take 1 tablet (100 mcg total) by mouth daily. 90 tablet 3  . MAGNESIUM PO Take 1 tablet by mouth daily.    Marland Kitchen MELATONIN PO Take 1 tablet by mouth daily.    . MULTIPLE VITAMIN PO Take 1 tablet by mouth daily.    . Multiple Vitamins-Minerals (ZINC PO) Take 1 tablet by mouth daily.    . nitroGLYCERIN (NITRO-DUR) 0.2 mg/hr patch Place 1 patch (0.2 mg total) onto the skin daily. (Patient taking differently: Place 0.2 mg onto the skin as needed.) 30 patch 12  . NON FORMULARY VSL live probiotic as needed    . OVER THE COUNTER MEDICATION CBD as needed    . thiamine (VITAMIN B-1) 100 MG tablet Take 100 mg by mouth daily.    Marland Kitchen atorvastatin (LIPITOR) 10 MG tablet Take 1 tablet (10 mg total) by mouth daily. (Patient not taking: Reported on 02/05/2020) 30 tablet 11   No current facility-administered medications for this visit.    Allergies as of 02/05/2020 - Review Complete 02/05/2020  Allergen Reaction Noted  .  Fluticasone Other (See Comments) 03/13/2015  . Mometasone Other (See Comments) 03/13/2015  . Fexofenadine Palpitations 07/22/2013    Family History  Problem Relation Age of Onset  . Heart failure Mother   . Heart attack Mother   . Heart disease Mother   . Heart attack Father   . Heart disease Father   . Heart attack Maternal Grandfather   . Diabetes Son        IDDM  . Heart disease Maternal Grandmother   . Thyroid disease Neg Hx   . Colon cancer Neg Hx   . Stomach cancer Neg Hx   . Esophageal cancer Neg Hx     Social History   Socioeconomic History  . Marital status: Married    Spouse name: Mercie Eon. Kosman  . Number of children: 2  . Years of education: MA  . Highest education level: Not on file  Occupational History  . Occupation: Retired    Comment: Music therapist  Tobacco Use  . Smoking status: Never Smoker  . Smokeless tobacco: Never Used  Vaping Use  . Vaping Use: Never used  Substance and Sexual  Activity  . Alcohol use: Not Currently  . Drug use: No  . Sexual activity: Not Currently  Other Topics Concern  . Not on file  Social History Narrative   Patient lives at [place] with spouse.   Caffeine Use: 1 cup daily   2 kids- 4 grandchildren- two boys and two girls      Social Determinants of Health   Financial Resource Strain: Not on file  Food Insecurity: Not on file  Transportation Needs: Not on file  Physical Activity: Not on file  Stress: Not on file  Social Connections: Not on file  Intimate Partner Violence: Not on file    Review of Systems: 12 system ROS is negative except as noted above with the addition of anxiety.   Physical Exam: General:   Alert,  well-nourished, pleasant and cooperative in NAD Head:  Normocephalic and atraumatic. Eyes:  Sclera clear, no icterus.   Conjunctiva pink. Ears:  Normal auditory acuity. Nose:  No deformity, discharge,  or lesions. Mouth:  No deformity or lesions.   Neck:  Supple; no masses or thyromegaly. Lungs:  Clear throughout to auscultation.   No wheezes. Heart:  Regular rate and rhythm; no murmurs. Abdomen:  Soft, thin, nontender, nondistended, normal bowel sounds, no rebound or guarding. No hepatosplenomegaly. I am unable to reproduce her abdominal pain.    Rectal:  Deferred  Msk:  Symmetrical. No boney deformities LAD: No inguinal or umbilical LAD Extremities:  No clubbing or edema. Neurologic:  Alert and  oriented x4;  grossly nonfocal Skin:  Intact without significant lesions or rashes. Psych:  Alert and cooperative. Normal mood and affect.     Karan Ramnauth L. Tarri Glenn, MD, MPH 02/05/2020, 8:56 AM

## 2020-02-10 LAB — GI PROFILE, STOOL, PCR

## 2020-02-11 DIAGNOSIS — I7300 Raynaud's syndrome without gangrene: Secondary | ICD-10-CM

## 2020-02-11 HISTORY — DX: Raynaud's syndrome without gangrene: I73.00

## 2020-02-12 LAB — CALPROTECTIN: Calprotectin: 34 mcg/g

## 2020-02-18 ENCOUNTER — Ambulatory Visit: Payer: Medicare Other | Admitting: Family Medicine

## 2020-02-21 ENCOUNTER — Other Ambulatory Visit: Payer: Self-pay

## 2020-02-21 ENCOUNTER — Telehealth: Payer: Self-pay | Admitting: Gastroenterology

## 2020-02-21 ENCOUNTER — Ambulatory Visit
Admission: RE | Admit: 2020-02-21 | Discharge: 2020-02-21 | Disposition: A | Discharge status: Home / Self Care | Payer: Medicare Other | Source: Ambulatory Visit | Attending: Gastroenterology | Admitting: Gastroenterology

## 2020-02-21 DIAGNOSIS — R194 Change in bowel habit: Secondary | ICD-10-CM

## 2020-02-21 DIAGNOSIS — R935 Abnormal findings on diagnostic imaging of other abdominal regions, including retroperitoneum: Secondary | ICD-10-CM

## 2020-02-21 MED ORDER — GADOBENATE DIMEGLUMINE 529 MG/ML IV SOLN
9.0000 mL | Freq: Once | INTRAVENOUS | Status: AC | PRN
  Administered 2020-02-21: 9 mL via INTRAVENOUS

## 2020-02-21 NOTE — Telephone Encounter (Signed)
Inbound call from patient requesting to schedule colonoscopy.  Informed next availability with Dr. Tarri Glenn is not until 04/09/20.  Patient is asking if there is anyway we can work her in sooner or even have procedure with other provider if possible.  Please advise.

## 2020-02-21 NOTE — Telephone Encounter (Signed)
See note below and advise. Pt advice request sent also.

## 2020-02-21 NOTE — Telephone Encounter (Signed)
The schedule has filled up quickly. I could perform a colonoscopy 03/06/20 at 7:30 am. She had previously requested both a Friday and a morning appointment. Otherwise, she can go on a cancellation list.

## 2020-02-21 NOTE — Telephone Encounter (Signed)
Pt scheduled for previsit 02/25/20@1pm . Colon scheduled in the [facility] 03/06/20@7 :30am. Pt aware.

## 2020-02-25 ENCOUNTER — Ambulatory Visit: Payer: Medicare Other | Admitting: Family Medicine

## 2020-02-25 ENCOUNTER — Ambulatory Visit (AMBULATORY_SURGERY_CENTER): Payer: Self-pay | Admitting: *Deleted

## 2020-02-25 ENCOUNTER — Other Ambulatory Visit: Payer: Self-pay

## 2020-02-25 VITALS — Ht 67.0 in | Wt 108.0 lb

## 2020-02-25 DIAGNOSIS — R935 Abnormal findings on diagnostic imaging of other abdominal regions, including retroperitoneum: Secondary | ICD-10-CM

## 2020-02-25 DIAGNOSIS — R194 Change in bowel habit: Secondary | ICD-10-CM

## 2020-02-25 MED ORDER — SUPREP BOWEL PREP KIT 17.5-3.13-1.6 GM/177ML PO SOLN: 1.0000 | Freq: Once | ORAL | 0 refills | Status: AC

## 2020-02-25 NOTE — Progress Notes (Signed)
No egg or soy allergy known to patient -ON fodmap diet so doesn't eat soy  No issues with past sedation with any surgeries or procedures No intubation problems in the past  No FH of Malignant Hyperthermia No diet pills per patient No home 02 use per patient  No blood thinners per patient  Pt denies issues with constipation  No A fib or A flutter  EMMI video to pt or via [place] 19 guidelines implemented in [place] today with Pt and RN  Pt is fully vaccinated  for Covid  Pt denies loose or missing teeth, denies dentures, partials, dental implants, capped or bonded teeth  Pt aware may be an out of pocket expense for Suprep   Due to the COVID-19 pandemic we are asking patients to follow certain guidelines.  Pt aware of COVID protocols and [facility] guidelines

## 2020-02-27 ENCOUNTER — Encounter: Payer: Self-pay | Admitting: Family Medicine

## 2020-02-27 ENCOUNTER — Ambulatory Visit (INDEPENDENT_AMBULATORY_CARE_PROVIDER_SITE_OTHER): Payer: Medicare Other | Admitting: Family Medicine

## 2020-02-27 ENCOUNTER — Other Ambulatory Visit: Payer: Self-pay

## 2020-02-27 VITALS — BP 110/58 | HR 79 | Temp 97.8°F | Ht 67.5 in | Wt 107.8 lb

## 2020-02-27 DIAGNOSIS — R251 Tremor, unspecified: Secondary | ICD-10-CM

## 2020-02-27 DIAGNOSIS — Z8249 Family history of ischemic heart disease and other diseases of the circulatory system: Secondary | ICD-10-CM | POA: Diagnosis not present

## 2020-02-27 DIAGNOSIS — I471 Supraventricular tachycardia: Secondary | ICD-10-CM

## 2020-02-27 DIAGNOSIS — Z8679 Personal history of other diseases of the circulatory system: Secondary | ICD-10-CM

## 2020-02-27 DIAGNOSIS — N3289 Other specified disorders of bladder: Secondary | ICD-10-CM | POA: Diagnosis not present

## 2020-02-27 DIAGNOSIS — I700 Atherosclerosis of aorta: Secondary | ICD-10-CM

## 2020-02-27 NOTE — Patient Instructions (Addendum)
We set up referrals.  Let me know in the next couple of months about how tremor is going.  Work on stress reduction and relaxation.

## 2020-02-27 NOTE — Assessment & Plan Note (Signed)
Refer to urology for consideration of interstitial cystitis.  Pt not interested in treatment trial at this time.

## 2020-02-27 NOTE — Assessment & Plan Note (Signed)
Reported per pt. Not sure if she instead means SVT... was started on atenolol.

## 2020-02-27 NOTE — Progress Notes (Signed)
Patient ID: Jessica Miles, female    DOB: March 22, 1945, 75 y.o.   MRN: [id number]  This visit was conducted in person.  BP (!) 110/58   Pulse 79   Temp 97.8 F (36.6 C) (Temporal)   Ht 5' 7.5" (1.715 m)   Wt 107 lb 12 oz (48.9 kg)   SpO2 100%   BMI 16.63 kg/m    CC:  Chief Complaint  Patient presents with  . Follow-up    Abd. Pain following CT scan     Subjective:   HPI: Jessica Miles is a 75 y.o. female presenting on 02/27/2020 for Follow-up (Abd. Pain following CT scan )   hx of IBS  Now followed by Jessica Miles and in midst of workup of  Abdominal pain, LLQ Upcoming colonoscopy.   CT abdomen 12 /06/2019 IMPRESSION: 1. Abnormal wall thickening particularly in the distal transverse colon, descending colon, sigmoid colon, and parts of the ascending colon. Appearance favors colitis which could be arising from inflammatory bowel disease or infectious colitis. 2. There is some focal wall thickening in the ascending colon and colon malignancy is not excluded. Consider colonoscopy for characterization at appropriate time. 3. Trace free pelvic fluid eccentric to the right, nonspecific. 4. Other imaging findings of potential clinical significance: Dextroconvex lumbar scoliosis. Benign-appearing right renal cysts. Mild prominence of the dorsal pancreatic duct which drains to both the major and minor fissures, slightly increased from the 2012 exam, but without underlying visible lesion. 5. Aortic atherosclerosis.  She has not yet started atorvastatin. She is interested in seeing a cardiologist for risk reduction. Father passed from MI age 53, Mom with MI age 19.  2008 Stress test negative. ECHO nml per pt.  Wore monitor  For heart pounding.. saw SVT ( pt at first said Vtach but I belive she misspoke ( no records)... started atenolol.     Hx of bladder irritation.. thought possible Interstitial cystitis.Marland Kitchen improved with amitrypiline, but stopped when came off ( scared of SE).  Azo  stopped helping.  Request referral to Urologist.    Has noted tremor in hands... muscles seem to shake, noted since 01/23/2020   3- month earlier had COVID. Having 1 cup caffeinated coffee.  Nml glucose and TSH in   no family history of parkinson's   Relevant past medical, surgical, family and social history reviewed and updated as indicated. Interim medical history since our last visit reviewed. Allergies and medications reviewed and updated. Outpatient Medications Prior to Visit  Medication Sig Dispense Refill  . Alpha-Lipoic Acid 100 MG CAPS Take 1 capsule by mouth daily.    Marland Kitchen ascorbic acid (VITAMIN C) 500 MG tablet Take 500 mg by mouth 2 (two) times daily.     Marland Kitchen atenolol (TENORMIN) 25 MG tablet Take 25 mg by mouth daily as needed.    Marland Kitchen buPROPion (WELLBUTRIN SR) 100 MG 12 hr tablet TAKE 1 TABLET TWICE A DAY 180 tablet 3  . Calcium Carb-Cholecalciferol (CALCIUM-VITAMIN D) 600-400 MG-UNIT TABS Take 1 tablet by mouth daily.    . Cyanocobalamin (VITAMIN B-12 PO) Take 1 tablet by mouth daily.    . hydrocortisone-pramoxine (ANALPRAM-HC) 2.5-1 % rectal cream Place 1 application rectally as needed for hemorrhoids or anal itching.    . levothyroxine (SYNTHROID) 100 MCG tablet Take 1 tablet (100 mcg total) by mouth daily. 90 tablet 3  . Lifitegrast (XIIDRA) 5 % SOLN     . MAGNESIUM PO Take 1 tablet by mouth daily.    Marland Kitchen MELATONIN  PO Take 1 tablet by mouth daily.    . MULTIPLE VITAMIN PO Take 1 tablet by mouth daily.    . Multiple Vitamins-Minerals (ZINC PO) Take 1 tablet by mouth daily.    . NON FORMULARY VSL live probiotic as needed    . OVER THE COUNTER MEDICATION CBD nightly    . thiamine (VITAMIN B-1) 100 MG tablet Take 100 mg by mouth daily.    Marland Kitchen atorvastatin (LIPITOR) 10 MG tablet Take 1 tablet (10 mg total) by mouth daily. (Patient not taking: No sig reported) 30 tablet 11  . nitroGLYCERIN (NITRO-DUR) 0.2 mg/hr patch Place 1 patch (0.2 mg total) onto the skin daily. (Patient not taking:  No sig reported) 30 patch 12   No facility-administered medications prior to visit.     Per HPI unless specifically indicated in ROS section below Review of Systems  Constitutional: Negative for fatigue and fever.  HENT: Negative for congestion.   Eyes: Negative for pain.  Respiratory: Negative for cough and shortness of breath.   Cardiovascular: Negative for chest pain, palpitations and leg swelling.  Gastrointestinal: Negative for abdominal pain.  Genitourinary: Negative for dysuria and vaginal bleeding.  Musculoskeletal: Negative for back pain.  Neurological: Positive for tremors. Negative for syncope, light-headedness and headaches.  Psychiatric/Behavioral: Negative for dysphoric mood.   Objective:  BP (!) 110/58   Pulse 79   Temp 97.8 F (36.6 C) (Temporal)   Ht 5' 7.5" (1.715 m)   Wt 107 lb 12 oz (48.9 kg)   SpO2 100%   BMI 16.63 kg/m   Wt Readings from Last 3 Encounters:  02/27/20 107 lb 12 oz (48.9 kg)  02/25/20 108 lb (49 kg)  12/13/19 106 lb 4 oz (48.2 kg)      Physical Exam Constitutional:      General: She is not in acute distress.Vital signs are normal.     Appearance: Normal appearance. She is well-developed and well-nourished. She is not ill-appearing or toxic-appearing.  HENT:     Head: Normocephalic.     Right Ear: Hearing, tympanic membrane, ear canal and external ear normal. Tympanic membrane is not erythematous, retracted or bulging.     Left Ear: Hearing, tympanic membrane, ear canal and external ear normal. Tympanic membrane is not erythematous, retracted or bulging.     Nose: No mucosal edema or rhinorrhea.     Right Sinus: No maxillary sinus tenderness or frontal sinus tenderness.     Left Sinus: No maxillary sinus tenderness or frontal sinus tenderness.     Mouth/Throat:     Mouth: Oropharynx is clear and moist and mucous membranes are normal.     Pharynx: Uvula midline.  Eyes:     General: Lids are normal. Lids are everted, no foreign bodies  appreciated.     Extraocular Movements: EOM normal.     Conjunctiva/sclera: Conjunctivae normal.     Pupils: Pupils are equal, round, and reactive to light.  Neck:     Thyroid: No thyroid mass or thyromegaly.     Vascular: No carotid bruit.     Trachea: Trachea normal.  Cardiovascular:     Rate and Rhythm: Normal rate and regular rhythm.     Pulses: Normal pulses and intact distal pulses.     Heart sounds: Normal heart sounds, S1 normal and S2 normal. No murmur heard. No friction rub. No gallop.   Pulmonary:     Effort: Pulmonary effort is normal. No tachypnea or respiratory distress.     Breath  sounds: Normal breath sounds. No decreased breath sounds, wheezing, rhonchi or rales.  Abdominal:     General: Bowel sounds are normal.     Palpations: Abdomen is soft.     Tenderness: There is no abdominal tenderness.  Musculoskeletal:     Cervical back: Normal range of motion and neck supple.  Skin:    General: Skin is warm, dry and intact.     Findings: No rash.  Neurological:     Mental Status: She is alert and oriented to person, place, and time.     GCS: GCS eye subscore is 4. GCS verbal subscore is 5. GCS motor subscore is 6.     Cranial Nerves: No cranial nerve deficit.     Sensory: No sensory deficit.     Motor: No abnormal muscle tone.     Coordination: She displays a negative Romberg sign. Coordination normal.     Gait: Gait normal.     Deep Tendon Reflexes: Strength normal and reflexes are normal and symmetric.     Comments: Nml cerebellar exam   No papilledema   no intention tremor, no pill rolling, no cogwheeling.   Gait: walks with swinging arms and does not shuffle.  Psychiatric:        Mood and Affect: Mood and affect normal. Mood is not anxious or depressed.        Speech: Speech normal.        Behavior: Behavior normal. Behavior is cooperative.        Thought Content: Thought content normal.        Cognition and Memory: Cognition and memory normal. Memory is  not impaired. She does not exhibit impaired recent memory or impaired remote memory.        Judgment: Judgment normal.       Results for orders placed or performed in visit on 02/05/20  CALPROTECTIN   Specimen: Stool  Result Value Ref Range   Calprotectin 34 mcg/g  Creatinine, serum  Result Value Ref Range   Creatinine, Ser 0.98 0.40 - 1.20 mg/dL  BUN  Result Value Ref Range   BUN 17 6 - 23 mg/dL  GI Profile, Stool, PCR  Result Value Ref Range   Campylobacter Not Detected Not Detected   C difficile toxin A/B Not Detected Not Detected   Plesiomonas shigelloides Not Detected Not Detected   Salmonella Not Detected Not Detected   Vibrio Not Detected Not Detected   Vibrio cholerae Not Detected Not Detected   Yersinia enterocolitica Not Detected Not Detected   Enteroaggregative E coli Not Detected Not Detected   Enteropathogenic E coli Not Detected Not Detected   Enterotoxigenic E coli Not Detected Not Detected   Shiga-toxin-producing E coli Not Detected Not Detected   E coli Z366 Not applicable Not Detected   Shigella/Enteroinvasive E coli Not Detected Not Detected   Cryptosporidium Not Detected Not Detected   Cyclospora cayetanensis Not Detected Not Detected   Entamoeba histolytica Not Detected Not Detected   Giardia lamblia Not Detected Not Detected   Adenovirus F 40/41 Not Detected Not Detected   Astrovirus Not Detected Not Detected   Norovirus GI/GII Not Detected Not Detected   Rotavirus A Not Detected Not Detected   Sapovirus Not Detected Not Detected    This visit occurred during the SARS-CoV-2 public health emergency.  Safety protocols were in place, including screening questions prior to the visit, additional usage of staff PPE, and extensive cleaning of exam room while observing appropriate contact time as  indicated for disinfecting solutions.   COVID 19 screen:  No recent travel or known exposure to COVID19 The patient denies respiratory symptoms of COVID 19 at this  time. The importance of social distancing was discussed today.   Assessment and Plan    Problem List Items Addressed This Visit    Aortic atherosclerosis ([place])    Newly diagnosed on CT incidentally.  She has not started statin ( lipitor 10 mg daily)   She wishes to see cardiology first.   Additionally has hx of   SVT.      Relevant Orders   Ambulatory referral to Cardiology   Bladder irritation    Refer to urology for consideration of interstitial cystitis.  Pt not interested in treatment trial at this time.      Relevant Orders   Ambulatory referral to Urology   Family history of early CAD - Primary   Relevant Orders   Ambulatory referral to Cardiology   SVT (supraventricular tachycardia) ([place])     Stable control on atenolol.  Pt requests referral back to cardiology.      Relevant Orders   Ambulatory referral to Cardiology   Tremor       Eliezer Lofts, MD

## 2020-02-28 ENCOUNTER — Telehealth: Payer: Self-pay | Admitting: *Deleted

## 2020-02-28 ENCOUNTER — Encounter: Payer: Self-pay | Admitting: *Deleted

## 2020-02-28 NOTE — Telephone Encounter (Signed)
Spoke with Jessica Miles.  She was thinking it was SVT but she is not 100% sure.  She does have all her paperwork and test from previous cardiologist and will take everything will her to her appointment on 03/17/2020.  She also states her urology appointment is 04/06/2020.  She wanted to let Dr. Diona Browner know how wonderful she was yesterday and how much better she felt after seeing her.  She appreciated her listening to her.

## 2020-02-28 NOTE — Progress Notes (Signed)
See Phone Note. 

## 2020-02-28 NOTE — Telephone Encounter (Signed)
-----   Message from Jinny Sanders, MD sent at 02/27/2020  4:48 PM EST ----- Please call pt to clarify.Marland Kitchen at appt she mentioned a history of Vtach (ventricular tachycardia) but her record indicate SVT (supraventicular tachycardia)... does she know which.. they are very different.. does she have a copy of appt after the heart monitor? Would be good to bring to cardiology.

## 2020-03-06 ENCOUNTER — Other Ambulatory Visit: Payer: Self-pay

## 2020-03-06 ENCOUNTER — Encounter: Payer: Self-pay | Admitting: Gastroenterology

## 2020-03-06 ENCOUNTER — Ambulatory Visit (AMBULATORY_SURGERY_CENTER): Payer: Medicare Other | Admitting: Gastroenterology

## 2020-03-06 VITALS — BP 111/55 | HR 68 | Temp 97.0°F | Resp 17 | Ht 67.0 in | Wt 108.0 lb

## 2020-03-06 DIAGNOSIS — R935 Abnormal findings on diagnostic imaging of other abdominal regions, including retroperitoneum: Secondary | ICD-10-CM | POA: Diagnosis not present

## 2020-03-06 DIAGNOSIS — R194 Change in bowel habit: Secondary | ICD-10-CM | POA: Diagnosis present

## 2020-03-06 DIAGNOSIS — K5730 Diverticulosis of large intestine without perforation or abscess without bleeding: Secondary | ICD-10-CM

## 2020-03-06 DIAGNOSIS — K648 Other hemorrhoids: Secondary | ICD-10-CM | POA: Diagnosis not present

## 2020-03-06 MED ORDER — SODIUM CHLORIDE 0.9 % IV SOLN
500.0000 mL | Freq: Once | INTRAVENOUS | Status: DC
Start: 2020-03-06 — End: 2020-03-06

## 2020-03-06 NOTE — Patient Instructions (Signed)
YOU HAD AN ENDOSCOPIC PROCEDURE TODAY AT THE [facility]:   Refer to the procedure report that was given to you for any specific questions about what was found during the examination.  If the procedure report does not answer your questions, please call your gastroenterologist to clarify.  If you requested that your care partner not be given the details of your procedure findings, then the procedure report has been included in a sealed envelope for you to review at your convenience later.  YOU SHOULD EXPECT: Some feelings of bloating in the abdomen. Passage of more gas than usual.  Walking can help get rid of the air that was put into your GI tract during the procedure and reduce the bloating. If you had a lower endoscopy (such as a colonoscopy or flexible sigmoidoscopy) you may notice spotting of blood in your stool or on the toilet paper. If you underwent a bowel prep for your procedure, you may not have a normal bowel movement for a few days.  Please Note:  You might notice some irritation and congestion in your nose or some drainage.  This is from the oxygen used during your procedure.  There is no need for concern and it should clear up in a day or so.  SYMPTOMS TO REPORT IMMEDIATELY:   Following lower endoscopy (colonoscopy or flexible sigmoidoscopy):  Excessive amounts of blood in the stool  Significant tenderness or worsening of abdominal pains  Swelling of the abdomen that is new, acute  Fever of 100F or higher   Following upper endoscopy (EGD)  Vomiting of blood or coffee ground material  New chest pain or pain under the shoulder blades  Painful or persistently difficult swallowing  New shortness of breath  Fever of 100F or higher  Black, tarry-looking stools  For urgent or emergent issues, a gastroenterologist can be reached at any hour by calling [phone]. Do not use MyChart messaging for urgent concerns.    DIET:  We do recommend a small meal at first, but  then you may proceed to your regular diet.  Drink plenty of fluids but you should avoid alcoholic beverages for 24 hours.  ACTIVITY:  You should plan to take it easy for the rest of today and you should NOT DRIVE or use heavy machinery until tomorrow (because of the sedation medicines used during the test).    FOLLOW UP: Our staff will call the number listed on your records 48-72 hours following your procedure to check on you and address any questions or concerns that you may have regarding the information given to you following your procedure. If we do not reach you, we will leave a message.  We will attempt to reach you two times.  During this call, we will ask if you have developed any symptoms of COVID 19. If you develop any symptoms (ie: fever, flu-like symptoms, shortness of breath, cough etc.) before then, please call [phone].  If you test positive for Covid 19 in the 2 weeks post procedure, please call and report this information to us.    If any biopsies were taken you will be contacted by phone or by letter within the next 1-3 weeks.  Please call us at [phone] if you have not heard about the biopsies in 3 weeks.    SIGNATURES/CONFIDENTIALITY: You and/or your care partner have signed paperwork which will be entered into your electronic medical record.  These signatures attest to the fact that that the information above on   your After Visit Summary has been reviewed and is understood.  Full responsibility of the confidentiality of this discharge information lies with you and/or your care-partner. 

## 2020-03-06 NOTE — Op Note (Signed)
[facility] Patient Name: Jessica Miles Procedure Date: 03/06/2020 7:06 AM MRN: [id number] Endoscopist: Thornton Park MD, MD Age: 75 Referring MD:  Date of Birth: 1945/12/15 Gender: Female Account #: [id number] Procedure:                Colonoscopy Indications:              Left lower quadrant pain with altered bowel habits                            different from her chronic constipation IBS                            symptoms. CT abd 12/16/2019 with suspected colitis                            +/- malignancy in the right colon. Symptoms                            worsened by amoxicillin, but have now improved with                            FODMAP diet and 7 days of steroids. Colonoscopy                            with Dr. Vira Agar 10/10/17 to the cecum: internal                            hemorrhoids. Tortuous colon. Medicines:                Monitored Anesthesia Care Procedure:                Pre-Anesthesia Assessment:                           - Prior to the procedure, a History and Physical                            was performed, and patient medications and                            allergies were reviewed. The patient's tolerance of                            previous anesthesia was also reviewed. The risks                            and benefits of the procedure and the sedation                            options and risks were discussed with the patient.                            All questions were answered, and informed consent  was obtained. Prior Anticoagulants: The patient has                            taken no previous anticoagulant or antiplatelet                            agents. ASA Grade Assessment: II - A patient with                            mild systemic disease. After reviewing the risks                            and benefits, the patient was deemed in                            satisfactory condition to undergo the  procedure.                           After obtaining informed consent, the colonoscope                            was passed under direct vision. Throughout the                            procedure, the patient's blood pressure, pulse, and                            oxygen saturations were monitored continuously. The                            Olympus PCF-H190DL ([id number]) Colonoscope was                            introduced through the anus and advanced to the 5                            cm into the ileum. The colonoscopy was performed                            with moderate difficulty due to significant looping                            and a tortuous colon. Successful completion of the                            procedure was aided by applying abdominal pressure.                            The patient tolerated the procedure well. The                            quality of the bowel preparation was excellent. The  terminal ileum, ileocecal valve, appendiceal                            orifice, and rectum were photographed. Scope In: 1:06:26 AM Scope Out: 7:52:51 AM Scope Withdrawal Time: 0 hours 12 minutes 26 seconds  Total Procedure Duration: 0 hours 16 minutes 33 seconds  Findings:                 The perianal and digital rectal examinations were                            normal.                           A few small and large-mouthed diverticula were                            found in the sigmoid colon.                           The sigmoid colon, descending colon and ascending                            colon were tortuous.                           Small internal hemorrhoids were present. No                            abnormality identified in the right colon, cecum,                            or distal terminal ileum.                           The exam was otherwise without abnormality on                            direct and retroflexion  views. Complications:            No immediate complications. Estimated Blood Loss:     Estimated blood loss: none. Impression:               - Mild diverticulosis in the sigmoid colon without                            diverticulitis.                           - Tortuous colon.                           - The examination was otherwise normal on direct                            and retroflexion views.                           -  No specimens collected. Recommendation:           - Patient has a contact number available for                            emergencies. The signs and symptoms of potential                            delayed complications were discussed with the                            patient. Return to normal activities tomorrow.                            Written discharge instructions were provided to the                            patient.                           - Resume previous diet.                           - Continue present medications.                           - Await pathology results.                           - No surveillance endoscopy recommended at age 55.                           - Emerging evidence supports eating a diet of                            fruits, vegetables, grains, calcium, and yogurt                            while reducing red meat and alcohol may reduce the                            risk of colon cancer.                           - Your recent MRI/MRCP showed a slight prominent of                            the pancreatic duct. This is of unclear clinical                            significance. I recommend that you have labs                            (hepatic function panel and lipase) if symptoms  recur. Will want to consider follow-up abdominal                            imaging to monitor for                            stability/progression/resolution +/- EUS if still                            present to  further evaluate the ampulla. We can                            further discuss at your next office appointment,                            however, please alert me if your symptoms recur                            prior to that time.                           - Please call the office to schedule a follow-up                            appointment. Thornton Park MD, MD 03/06/2020 8:00:58 AM This report has been signed electronically.

## 2020-03-06 NOTE — Progress Notes (Signed)
pt tolerated well. VSS. awake and to recovery. Report given to RN.  

## 2020-03-06 NOTE — Progress Notes (Signed)
Pt's states no medical or surgical changes since previsit or office visit. 

## 2020-03-09 ENCOUNTER — Ambulatory Visit: Payer: Medicare Other | Admitting: Cardiology

## 2020-03-09 ENCOUNTER — Telehealth: Payer: Self-pay

## 2020-03-09 NOTE — Telephone Encounter (Signed)
Left voicemail message requesting to have patient call back with name of cardiologist seen in [place] to request records for upcoming appointment with Dr. Rockey Situ on 03/17/2020.

## 2020-03-10 ENCOUNTER — Encounter: Payer: Self-pay | Admitting: Gastroenterology

## 2020-03-10 ENCOUNTER — Other Ambulatory Visit: Payer: Self-pay

## 2020-03-10 ENCOUNTER — Ambulatory Visit (INDEPENDENT_AMBULATORY_CARE_PROVIDER_SITE_OTHER): Payer: Medicare Other | Admitting: Gastroenterology

## 2020-03-10 ENCOUNTER — Telehealth: Payer: Self-pay | Admitting: *Deleted

## 2020-03-10 VITALS — BP 90/70 | HR 94 | Ht 67.0 in | Wt 107.0 lb

## 2020-03-10 DIAGNOSIS — R194 Change in bowel habit: Secondary | ICD-10-CM

## 2020-03-10 MED ORDER — RIFAXIMIN 550 MG PO TABS: 550.0000 mg | ORAL_TABLET | Freq: Two times a day (BID) | ORAL | 0 refills | Status: DC

## 2020-03-10 NOTE — Patient Instructions (Addendum)
It was a pleasure to see you today. Based on our discussion, I am providing you with my recommendations below:  RECOMMENDATION(S):   I am recommending a daily bulking agent with psyllium or methylcellulose (Citrucel in specific).   Please call the office at [phone] to let me know if you wish to schedule an Endoscopy with Endoscopy Ultrasound.  I would like for you to take some time to consider a Nutritional Consult. If you would like for me to place a referral, please call the office at [phone].  Finally, at your request, I am sending a 14 day supply of Xifaxan to your pharmacy.  BMI:  . If you are age 25 or older, your body mass index should be between 23-30. Your There is no height or weight on file to calculate BMI. If this is out of the aforementioned range listed, please consider follow up with your Primary Care Provider.  Thank you for trusting me with your gastrointestinal care!    Thornton Park, MD, MPH

## 2020-03-10 NOTE — Progress Notes (Signed)
Referring Provider: Jinny Sanders, MD Primary Care Physician:  Jinny Sanders, MD  Reason for Consultation: Abnormal CT   IMPRESSION:  Symptomatic diverticulosis +/- SIBO given her presentation of left lower quadrant pain with altered bowel habits different from her chronic constipation IBS symptoms. CT abd  12/16/2019 with suspected colitis +/- malignancy in the right colon, but, none of those findings were confirmed on recent CT scan. GI pathogen panel and fecal calprotectin were normal.   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 Slight prominence of the pancreatic duct confirmed on MRI/MRCP.  Recommend repeat hepatic function panel and lipase at time of recurrent abdominal discomfort. Consider endoscopic ultrasound and side-viewing endoscopy to ensure no evidence of a ampullary lesion.    Constipation-IBS previously controlled with FODMAP diet.  She will continue her FODMAP diet although I feel this is far more restrictive than hoped.  We discussed resuming VSL probiotics.   PLAN: - Trial of Xifaxan 550 mg TID x 2 weeks, no systemic antibiotics if cost is prohibitive - Add a daily bulking agent with psyllium or methylcellulose (Citrucel in specific) - EGD with EUS with Dr. Ardis Hughs or [place] (if she would like to proceed) - Consider nutrition consult in an effort to minimize dietary restrictions - Consider virtual consultation with GI-focused dietician/nutritionist   Please see the "Patient Instructions" section for addition details about the plan.   HPI: Jessica Miles is a 75 y.o. female who returns in follow-up for further evaluation of left lower quadrant pain.  She has a longstanding history of constipation-IBS diagnosed in 2015 and treated with the FODMAP diet, diverticulosis, and hemorrhoids treated with Analpram cream. Testing for celiac was negative. She was previously followed by Dr. Vira Agar at the [facility].   She had Covid 19 in September  with severe symptoms including significant weight loss down to 101 pounds. She had to stop FODMAP while recovering from Covid. In mid-November she developed a sharp, LLQ abdominal pain. She was very aware of stool or gas moving through her intestines.   A CT of the abdomen and pelvis 12/16/2019 shows abnormal wall thickening in the distal transverse colon, descending colon, sigmoid colon, and parts of the ascending colon with focal wall thickening in the ascending colon worrisome for the possibility of malignancy.  A prominent pancreatic duct which appears slightly more prominent than the 2012 exam was also noted.  Resumed FODMAP diet, increasing water, and a probiotic (VSL x 3 months). Took amoxicillin and the CT prep but she developed severe diarrhea with mushy, malodorous stools, rectal pain and burning, and a hemorrhoid flare. She was concerned that she wasn't absorbing anything.    She took a 7 day course of prednisone that completely resolved her symptoms.   At the time of her initial consultation 02/05/20 her stool had returned to normal. She regained the 10 pounds that she lost during her period of acute symptoms.   02/06/20 negative GI pathogen panel, fecal calprotectin 34  Colonoscopy 03/06/20 showed sigmoid diverticulosis, tortuous colon and small internal hemorrhoids.  She developed some abdominal discomfort the day after her colonoscopy. Pain is similar to the pain she experienced earlier. She is concerned that there there is a blockage of something passing through. She is noticing more gas and flatus.   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 No evidence of pancreatic mass or other obstructing etiology. Gallbladder sludge, without evidence of cholecystitis or biliary ductal dilatation.   Endoscopic history: -  Colonoscopy in 2012 for abdominal pain and again in 2019 at the [facility] clinic with Dr. Vira Agar.  It showed a "tortuous colon" and  hemorrhoids. A pediatric colonoscope was used. Unfortunately, Dr. Percell Boston colonoscopy report is not available in [place], but, Dr. Diona Browner summarized it in her last clinic note.  - Colonoscopy 03/06/20 showed sigmoid diverticulosis, tortuous colon and small internal hemorrhoids.    Past Medical History:  Diagnosis Date  . Allergy   . Anxiety   . COVID-19 virus infection    09-23-2019- 9-16 had monoclonal infusion   . Depression   . History of IBS   . HPV (human papilloma virus) infection   . Hyperlipidemia    82- not taking statin   . Loss of taste   . Neuropathy 2015   surgery for Mortons neuroma in 1985  . Osteopenia   . Tachycardia   . Tachycardia   . Thyroid disease     Past Surgical History:  Procedure Laterality Date  . COLONOSCOPY  2019   hems only   . [place]  . WISDOM TOOTH EXTRACTION      Current Outpatient Medications  Medication Sig Dispense Refill  . ascorbic acid (VITAMIN C) 500 MG tablet Take 500 mg by mouth 2 (two) times daily.     Marland Kitchen atenolol (TENORMIN) 25 MG tablet Take 25 mg by mouth daily as needed.    Marland Kitchen atorvastatin (LIPITOR) 10 MG tablet Take 1 tablet (10 mg total) by mouth daily. 30 tablet 11  . buPROPion (WELLBUTRIN SR) 100 MG 12 hr tablet TAKE 1 TABLET TWICE A DAY 180 tablet 3  . Calcium Carb-Cholecalciferol (CALCIUM-VITAMIN D) 600-400 MG-UNIT TABS Take 1 tablet by mouth daily.    . Cyanocobalamin (VITAMIN B-12 PO) Take 1 tablet by mouth daily.    . hydrocortisone-pramoxine (ANALPRAM-HC) 2.5-1 % rectal cream Place 1 application rectally as needed for hemorrhoids or anal itching.    . levothyroxine (SYNTHROID) 100 MCG tablet Take 1 tablet (100 mcg total) by mouth daily. 90 tablet 3  . Lifitegrast (XIIDRA) 5 % SOLN     . MAGNESIUM PO Take 1 tablet by mouth daily.    Marland Kitchen MELATONIN PO Take 1 tablet by mouth daily.    . MULTIPLE VITAMIN PO Take 1 tablet by mouth daily.    . Multiple Vitamins-Minerals (ZINC PO) Take 1 tablet by mouth  daily.    . nitroGLYCERIN (NITRO-DUR) 0.2 mg/hr patch Place 1 patch (0.2 mg total) onto the skin daily. 30 patch 12  . NON FORMULARY VSL live probiotic as needed    . OVER THE COUNTER MEDICATION CBD nightly    . thiamine (VITAMIN B-1) 100 MG tablet Take 100 mg by mouth daily.     No current facility-administered medications for this visit.    Allergies as of 03/10/2020 - Review Complete 03/10/2020  Allergen Reaction Noted  . Fluticasone Other (See Comments) 03/13/2015  . Mometasone Other (See Comments) 03/13/2015  . Fexofenadine Palpitations 07/22/2013      Physical Exam: General:   Alert,  well-nourished, pleasant and cooperative in NAD Head:  Normocephalic and atraumatic. Eyes:  Sclera clear, no icterus.   Conjunctiva pink. Abdomen:  Soft, thin, nontender, nondistended, normal bowel sounds, no rebound or guarding. No hepatosplenomegaly. I am unable to reproduce her abdominal pain.    Extremities:  No clubbing or edema. Neurologic:  Alert and  oriented x4;  grossly nonfocal Skin:  Intact without significant lesions or rashes. Psych:  Alert and  cooperative. Normal mood and affect.     Providence Stivers L. Tarri Glenn, MD, MPH 03/10/2020, 11:03 AM

## 2020-03-10 NOTE — Progress Notes (Signed)
We do not have any samples of Xifaxan to provide to the pt today. If we should receive any, will call pt to let her know. For now, we did send a Rx to pt pharmacy for her to trial

## 2020-03-10 NOTE — Telephone Encounter (Signed)
  Follow up Call-  Call back number 03/06/2020  Post procedure Call Back phone  # [phone]  Permission to leave phone message Yes  Some recent data might be hidden     Patient questions:  Do you have a fever, pain , or abdominal swelling? No. Pain Score  0 *  Have you tolerated food without any problems? Yes.    Have you been able to return to your normal activities? Yes.    Do you have any questions about your discharge instructions: Diet   No. Medications  No. Follow up visit  No.  Do you have questions or concerns about your Care? No.  Actions: * If pain score is 4 or above: No action needed, pain <4.  1. Have you developed a fever since your procedure? no  2.   Have you had an respiratory symptoms (SOB or cough) since your procedure? no  3.   Have you tested positive for COVID 19 since your procedure no  4.   Have you had any family members/close contacts diagnosed with the COVID 19 since your procedure?  no   If yes to any of these questions please route to Joylene John, RN and Joella Prince, RN

## 2020-03-12 ENCOUNTER — Other Ambulatory Visit: Payer: Self-pay

## 2020-03-12 DIAGNOSIS — R194 Change in bowel habit: Secondary | ICD-10-CM

## 2020-03-12 MED ORDER — RIFAXIMIN 550 MG PO TABS: 550.0000 mg | ORAL_TABLET | Freq: Two times a day (BID) | ORAL | 0 refills | Status: AC

## 2020-03-17 ENCOUNTER — Ambulatory Visit (INDEPENDENT_AMBULATORY_CARE_PROVIDER_SITE_OTHER): Payer: Medicare Other | Admitting: Cardiovascular Disease

## 2020-03-17 ENCOUNTER — Other Ambulatory Visit: Payer: Self-pay

## 2020-03-17 ENCOUNTER — Ambulatory Visit (INDEPENDENT_AMBULATORY_CARE_PROVIDER_SITE_OTHER): Payer: Medicare Other

## 2020-03-17 ENCOUNTER — Encounter: Payer: Self-pay | Admitting: Cardiovascular Disease

## 2020-03-17 ENCOUNTER — Telehealth: Payer: Self-pay

## 2020-03-17 VITALS — BP 100/60 | HR 71 | Ht 66.0 in | Wt 108.1 lb

## 2020-03-17 DIAGNOSIS — I700 Atherosclerosis of aorta: Secondary | ICD-10-CM

## 2020-03-17 DIAGNOSIS — Z8679 Personal history of other diseases of the circulatory system: Secondary | ICD-10-CM

## 2020-03-17 DIAGNOSIS — R42 Dizziness and giddiness: Secondary | ICD-10-CM | POA: Diagnosis not present

## 2020-03-17 DIAGNOSIS — R55 Syncope and collapse: Secondary | ICD-10-CM

## 2020-03-17 DIAGNOSIS — I491 Atrial premature depolarization: Secondary | ICD-10-CM

## 2020-03-17 NOTE — Patient Instructions (Addendum)
Medication Instructions:  No changes  Lab work: No new labs needed  Testing/Procedures: Zio monitor for near syncope/dizziness Heart monitor (Zio patch) for 2 weeks (14 days)   Your physician has recommended that you wear a Zio monitor. This monitor is a medical device that records the heart's electrical activity. Doctors most often use these monitors to diagnose arrhythmias. Arrhythmias are problems with the speed or rhythm of the heartbeat. The monitor is a small device applied to your chest. You can wear one while you do your normal daily activities. While wearing this monitor if you have any symptoms to push the button and record what you felt. Once you have worn this monitor for the period of time provider prescribed (Usually 14 days), you will return the monitor device in the postage paid box. Once it is returned they will download the data collected and provide [place] with a report which the provider will then review and we will call you with those results. Important tips:  1. Avoid showering during the first 24 hours of wearing the monitor. 2. Avoid excessive sweating to help maximize wear time. 3. Do not submerge the device, no hot tubs, and no swimming pools. 4. Keep any lotions or oils away from the patch. 5. After 24 hours you may shower with the patch on. Take brief showers with your back facing the shower head.  6. Do not remove patch once it has been placed because that will interrupt data and decrease adhesive wear time. 7. Push the button when you have any symptoms and write down what you were feeling. 8. Once you have completed wearing your monitor, remove and place into box which has postage paid and place in your outgoing mailbox.  9. If for some reason you have misplaced your box then call our office and we can provide another box and/or mail it off for you.        Follow-Up:   . You will need a follow up appointment as needed  . Providers on your designated Care  Team:   . Murray Hodgkins, NP . Christell Faith, PA-C . Marrianne Mood, PA-C  Any Other Special Instructions Will Be Listed Below (If Applicable).  COVID-19 Vaccine Information can be found at: [email or web address].[email or web address] For questions related to vaccine distribution or appointments, please email [email or web address] or call [phone].

## 2020-03-17 NOTE — Telephone Encounter (Signed)
Chart Review Routing History Since 03/19/2019 (View Full Routing History)  Recipients Sent On Sent By Routed Reports  [facility]   03/17/2020 10:29 AM Gaberial Cada, LPN RIFAXIMIN 125 MG PO TABS ([id number])     Cover Page Message : Please provide status update re: this Rx      [facility]   03/17/2020 10:28 AM Delecia Vastine, LPN CHL AMB CHART REVIEW DEMOGRAPHICS     Cover Page Message : Please provide PA status update for Xifaxan       Above requests routed to [facility] to provide status update for PA to Xifaxan. Will continue to await response.

## 2020-03-17 NOTE — Progress Notes (Signed)
Cardiology Office Note  Date:  03/17/2020   ID:  Jessica Miles, DOB 04-08-1945, MRN [id number]  PCP:  Jinny Sanders, MD   Chief Complaint  Patient presents with  . New Patient (Initial Visit)    Ref by Dr. Eliezer Lofts for a cardiac history of arrhythmia & CT showing aortic atherosclerosis.  Medications reviewed by the patient verbally. Patient c/o pounding in chest and dizziness.     HPI:  Jessica Miles is a 75 year old woman with past medical history of Dizziness in 2008, cardiac workup at that time as detailed below IBS Covid: sept 2021 Who presents by referral from Dr. Diona Browner for cardiac arrhythmia, aortic atherosclerosis  Recent events reviewed with her Lost 8 pounds with covid More weight loss with diverticulitis Was sick for weeks Tried ABX, then prednisone Weight 101, Now 105 Has had dizziness at times with walking Does not feel it is orthostasis as it does not happen when she stands up Had similar episodes in the past, 2008, work-up at that time concerning for arrhythmia  Records requested and reviewed Echocardiogram 2008, ejection fraction 45 to 50% no significant valve disease Holter monitor with frequent PACs 39% burden  Was started on atenolol at that time  CT coronary calcium scoring of 0 done in 2008 CT chest without contrast, biapical scarring present, benign finding, simple renal cysts Orthostatics done in the office were negative supine 132/77, standing 120/64 no significant change in heart rate  EKG personally reviewed by myself on todays visit Shows normal sinus rhythm rate 71 bpm no significant ST-T wave changes  Other imaging studies  CT abdomen pelvis pulled up showing renal cysts, minimal aortic atherosclerosis LE arterial 03/2019 Right:  Resting right ankle-brachial index indicates noncompressible right lower  extremity arteries.  The right toe-brachial index is abnormal.  Specifically, there is normal blood flow to the affected third toe.    Left:  Resting left ankle-brachial index is within normal range.  No evidence of significant left lower extremity arterial disease.  The left toe-brachial index is abnormal.  PMH:   has a past medical history of Allergy, Anxiety, COVID-19 virus infection, Depression, History of IBS, HPV (human papilloma virus) infection, Hyperlipidemia, Loss of taste, Neuropathy (2015), Osteopenia, Tachycardia, Tachycardia, and Thyroid disease.  PSH:    Past Surgical History:  Procedure Laterality Date  . COLONOSCOPY  2019   hems only   . [place]  . WISDOM TOOTH EXTRACTION      Current Outpatient Medications  Medication Sig Dispense Refill  . ascorbic acid (VITAMIN C) 500 MG tablet Take 500 mg by mouth 2 (two) times daily.     Marland Kitchen atenolol (TENORMIN) 25 MG tablet Take 25 mg by mouth daily as needed.    Marland Kitchen buPROPion (WELLBUTRIN SR) 100 MG 12 hr tablet TAKE 1 TABLET TWICE A DAY 180 tablet 3  . Calcium Carb-Cholecalciferol (CALCIUM-VITAMIN D) 600-400 MG-UNIT TABS Take 1 tablet by mouth daily.    . Cyanocobalamin (VITAMIN B-12 PO) Take 1 tablet by mouth daily.    . hydrocortisone-pramoxine (ANALPRAM-HC) 2.5-1 % rectal cream Place 1 application rectally as needed for hemorrhoids or anal itching.    . levothyroxine (SYNTHROID) 100 MCG tablet Take 1 tablet (100 mcg total) by mouth daily. 90 tablet 3  . Lifitegrast (XIIDRA) 5 % SOLN     . MAGNESIUM PO Take 1 tablet by mouth daily.    Marland Kitchen MELATONIN PO Take 1 tablet by mouth daily.    . MULTIPLE  VITAMIN PO Take 1 tablet by mouth daily.    . Multiple Vitamins-Minerals (ZINC PO) Take 1 tablet by mouth daily.    . nitroGLYCERIN (NITRO-DUR) 0.2 mg/hr patch Place 1 patch (0.2 mg total) onto the skin daily. 30 patch 12  . NON FORMULARY VSL live probiotic as needed    . OVER THE COUNTER MEDICATION CBD nightly    . rifaximin (XIFAXAN) 550 MG TABS tablet Take 1 tablet (550 mg total) by mouth 2 (two) times daily for 14 days. 28 tablet 0  . thiamine  (VITAMIN B-1) 100 MG tablet Take 100 mg by mouth daily.    Marland Kitchen atorvastatin (LIPITOR) 10 MG tablet Take 1 tablet (10 mg total) by mouth daily. (Patient not taking: Reported on 03/17/2020) 30 tablet 11   No current facility-administered medications for this visit.     Allergies:   Fluticasone, Mometasone, and Fexofenadine   Social History:  The patient  reports that she has never smoked. She has never used smokeless tobacco. She reports previous alcohol use. She reports that she does not use drugs.   Family History:   family history includes Diabetes in her son; Heart attack in her father, maternal grandfather, and mother; Heart disease in her brother, father, maternal grandmother, and mother; Heart failure in her mother.    Review of Systems: Review of Systems  Constitutional: Negative.   HENT: Negative.   Respiratory: Negative.   Cardiovascular: Negative.   Gastrointestinal: Negative.   Musculoskeletal: Negative.   Neurological: Negative.   Psychiatric/Behavioral: Negative.   All other systems reviewed and are negative.   PHYSICAL EXAM: VS:  BP 100/60 (BP Location: Right Arm, Patient Position: Sitting, Cuff Size: Normal)   Pulse 71   Ht 5\' 6"  (1.676 m)   Wt 108 lb 2 oz (49 kg)   SpO2 98%   BMI 17.45 kg/m  , BMI Body mass index is 17.45 kg/m. GEN: Well nourished, well developed, in no acute distress HEENT: normal Neck: no JVD, carotid bruits, or masses Cardiac: RRR; no murmurs, rubs, or gallops,no edema  Respiratory:  clear to auscultation bilaterally, normal work of breathing GI: soft, nontender, nondistended, + BS MS: no deformity or atrophy Skin: warm and dry, no rash Neuro:  Strength and sensation are intact Psych: euthymic mood, full affect  Recent Labs: 10/03/2019: TSH 0.94 12/03/2019: ALT 30; Hemoglobin 14.1; Platelets 206.0; Potassium 4.0; Sodium 139 02/05/2020: BUN 17; Creatinine, Ser 0.98    Lipid Panel Lab Results  Component Value Date   CHOL 151  10/03/2019   HDL 52.20 10/03/2019   LDLCALC 83 10/03/2019   TRIG 80.0 10/03/2019      Wt Readings from Last 3 Encounters:  03/17/20 108 lb 2 oz (49 kg)  03/10/20 107 lb (48.5 kg)  03/06/20 108 lb (49 kg)       ASSESSMENT AND PLAN:  Problem List Items Addressed This Visit      Cardiology Problems   Aortic atherosclerosis (HCC) - Primary    Other Visit Diagnoses    Near syncope       Relevant Orders   LONG TERM MONITOR (3-14 DAYS)   Dizziness       Relevant Orders   EKG 12-Lead   LONG TERM MONITOR (3-14 DAYS)   Hx of cardiac disorder       Relevant Orders   EKG 12-Lead   PAC (premature atrial contraction)         Dizziness/near syncope Prior history of frequent PACs on monitor in  2008 Current etiology unclear, she reports less likely orthostasis Numbers measured today again would confirm that Otherwise very active at baseline with no symptoms concerning for angina -Recommended a ZIO monitor, placed on her visit today  Aortic atherosclerosis Very mild plaquing noted on review of CT scan No significant coronary calcification noted Given cholesterol very reasonable, non-smoker, no diabetes, no urgency to start a statin She is inclined not to be on a medication  PACs Prior Holter 2008 with 39% burden of PACs, started on atenolol at that time  ZIO has been ordered in light of recent near syncope symptoms  IBS  reports significant GI distress, weight loss, Weight has since stabilized, slowly trending back up more than 5 pounds from her baseline  Renal cysts Scheduled to see urology CT scan imaging reviewed with her in detail    Total encounter time more than 60 minutes  Greater than 50% was spent in counseling and coordination of care with the patient    Signed, Esmond Plants, M.D., Ph.D. [place], [place]

## 2020-03-19 NOTE — Telephone Encounter (Signed)
Patient calling to follow up on medication states she has been waiting for her medication and is not sure why it was sent to [facility]. Is highly concerned please advise.

## 2020-03-19 NOTE — Telephone Encounter (Signed)
APPROVAL  Medication: [place]: [employer] PA response: APPROVED Approval dates: 02/18/20 through 03/19/21 Misc. Notes: Joee Iovine (Key: [id number]) Xifaxan 550MG  tablets   Form [employer] Electronic PA Form [phone] NCPDP) Created 3 minutes ago Sent to Plan 1 minute ago Plan Response 1 minute ago Submit Clinical Questions less than a minute ago Determination Favorable less than a minute ago Message from Plan CaseId:[id number];Status:Approved;Review Type:Prior Auth;Coverage Start Date:02/18/2020;Coverage End Date:03/19/2021;

## 2020-03-27 ENCOUNTER — Encounter: Payer: Self-pay | Admitting: Gastroenterology

## 2020-03-31 DIAGNOSIS — R42 Dizziness and giddiness: Secondary | ICD-10-CM

## 2020-03-31 DIAGNOSIS — R55 Syncope and collapse: Secondary | ICD-10-CM | POA: Diagnosis not present

## 2020-04-06 ENCOUNTER — Other Ambulatory Visit: Payer: Self-pay

## 2020-04-06 ENCOUNTER — Ambulatory Visit (INDEPENDENT_AMBULATORY_CARE_PROVIDER_SITE_OTHER): Payer: Medicare Other | Admitting: Urology

## 2020-04-06 ENCOUNTER — Encounter: Payer: Self-pay | Admitting: Urology

## 2020-04-06 VITALS — BP 124/72 | HR 87 | Ht 66.0 in | Wt 107.0 lb

## 2020-04-06 DIAGNOSIS — N3020 Other chronic cystitis without hematuria: Secondary | ICD-10-CM

## 2020-04-06 DIAGNOSIS — N3289 Other specified disorders of bladder: Secondary | ICD-10-CM | POA: Diagnosis not present

## 2020-04-06 NOTE — Progress Notes (Signed)
04/06/2020 8:36 AM   Jessica Miles January 05, 1946 [id number]  Referring provider: Jinny Sanders, MD [address] [place],  [place] [address]  Chief Complaint  Patient presents with  . Cystitis    HPI: I was consulted to assess the patient for possible interstitial cystitis.  She will feel suprapubic pressure and the need to urinate and void small volumes.  It comes and goes.  It may last for a few days or just in the evening.  In an average month it may happen for 6 days.  She describes flares many years ago that used to be treated with Azo-Standard.  It would feel like a urinary tract infection but likely she is describing negative cultures.  In May she was given amitriptyline and it helped but had side effects.  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  She had abnormal thickening of the distal transverse colon and descending colon and sigmoid colon.  It favored colitis.  No recent urine culture  She voids every 2-3 hours and drinks a lot of fluid.  She gets up once a night.  Her flow was slow.  She does feel empty.  No hysterectomy.  No neurologic issues.  No kidney stones or bladder surgery   PMH: Past Medical History:  Diagnosis Date  . Allergy   . Anxiety   . COVID-19 virus infection    09-23-2019- 9-16 had monoclonal infusion   . Depression   . History of IBS   . HPV (human papilloma virus) infection   . Hyperlipidemia    82- not taking statin   . Loss of taste   . Neuropathy 2015   surgery for Mortons neuroma in 1985  . Osteopenia   . Tachycardia   . Tachycardia   . Thyroid disease     Surgical History: Past Surgical History:  Procedure Laterality Date  . COLONOSCOPY  2019   hems only   . [place]  . WISDOM TOOTH EXTRACTION      Home Medications:  Allergies as of 04/06/2020      Reactions   Fluticasone Other (See Comments)   Nose bleeds   Mometasone Other (See  Comments)   Epistaxis   Fexofenadine Palpitations      Medication List       Accurate as of April 06, 2020  8:36 AM. If you have any questions, ask your nurse or doctor.        ascorbic acid 500 MG tablet Commonly known as: VITAMIN C Take 500 mg by mouth 2 (two) times daily.   atenolol 25 MG tablet Commonly known as: TENORMIN Take 25 mg by mouth daily as needed.   atorvastatin 10 MG tablet Commonly known as: LIPITOR Take 1 tablet (10 mg total) by mouth daily.   buPROPion 100 MG 12 hr tablet Commonly known as: WELLBUTRIN SR TAKE 1 TABLET TWICE A DAY   Calcium-Vitamin D 600-400 MG-UNIT Tabs Take 1 tablet by mouth daily.   hydrocortisone-pramoxine 2.5-1 % rectal cream Commonly known as: ANALPRAM-HC Place 1 application rectally as needed for hemorrhoids or anal itching.   levothyroxine 100 MCG tablet Commonly known as: SYNTHROID Take 1 tablet (100 mcg total) by mouth daily.   MAGNESIUM PO Take 1 tablet by mouth daily.   MELATONIN PO Take 1 tablet by mouth daily.   MULTIPLE VITAMIN PO Take 1 tablet by mouth daily.  nitroGLYCERIN 0.2 mg/hr patch Commonly known as: Nitro-Dur Place 1 patch (0.2 mg total) onto the skin daily.   NON FORMULARY VSL live probiotic as needed   OVER THE COUNTER MEDICATION CBD nightly   rifaximin 550 MG Tabs tablet Commonly known as: XIFAXAN   thiamine 100 MG tablet Commonly known as: Vitamin B-1 Take 100 mg by mouth daily.   VITAMIN B-12 PO Take 1 tablet by mouth daily.   Xiidra 5 % Soln Generic drug: Lifitegrast   ZINC PO Take 1 tablet by mouth daily.       Allergies:  Allergies  Allergen Reactions  . Fluticasone Other (See Comments)    Nose bleeds  . Mometasone Other (See Comments)    Epistaxis  . Fexofenadine Palpitations    Family History: Family History  Problem Relation Age of Onset  . Heart failure Mother   . Heart attack Mother   . Heart disease Mother   . Heart attack Father   . Heart disease  Father   . Heart attack Maternal Grandfather   . Diabetes Son        IDDM  . Heart disease Maternal Grandmother   . Heart disease Brother   . Thyroid disease Neg Hx   . Stomach cancer Neg Hx   . Esophageal cancer Neg Hx   . Colon polyps Neg Hx   . Colon cancer Neg Hx   . Rectal cancer Neg Hx     Social History:  reports that she has never smoked. She has never used smokeless tobacco. She reports previous alcohol use. She reports that she does not use drugs.  ROS:                                        Physical Exam: BP 124/72   Pulse 87   Ht 5\' 6"  (1.676 m)   Wt 48.5 kg   BMI 17.27 kg/m   Constitutional:  Alert and oriented, No acute distress. HEENT: [place] AT, moist mucus membranes.  Trachea midline, no masses. Cardiovascular: No clubbing, cyanosis, or edema. Respiratory: Normal respiratory effort, no increased work of breathing. GI: Abdomen is soft, nontender, nondistended, no abdominal masses GU: No CVA tenderness.  No pelvic floor tenderness.  Excellent support.  Minimal atrophy and no prolapse Skin: No rashes, bruises or suspicious lesions. Lymph: No cervical or inguinal adenopathy. Neurologic: Grossly intact, no focal deficits, moving all 4 extremities. Psychiatric: Normal mood and affect.  Laboratory Data: Lab Results  Component Value Date   WBC 4.7 12/03/2019   HGB 14.1 12/03/2019   HCT 41.4 12/03/2019   MCV 92.6 12/03/2019   PLT 206.0 12/03/2019    Lab Results  Component Value Date   CREATININE 0.98 02/05/2020    No results found for: PSA  No results found for: TESTOSTERONE  Lab Results  Component Value Date   HGBA1C 5.6 09/10/2015    Urinalysis    Component Value Date/Time   BILIRUBINUR Negative 05/21/2019 0857   PROTEINUR Negative 05/21/2019 0857   UROBILINOGEN 0.2 05/21/2019 0857   NITRITE Negative 05/21/2019 0857   LEUKOCYTESUR Negative 05/21/2019 0857    Pertinent Imaging: Chart reviewed urine reviewed urine  sent for culture  Assessment & Plan: Patient might have interstitial cystitis and she has been told in the past or was suspect of this.  The role of cystoscopy discussed.  Patient I discussed differential diagnosis.  She  is never smoked.  She chose watchful waiting.  We talked about the possibility of interstitial cystitis and went through full hydrodistention template.  I will see her as needed.  If she wants a cystoscopy in the future she will call and schedule this  1. Bladder irritation  - Urinalysis, Complete   No follow-ups on file.  Reece Packer, MD  [facility] [address], [place], [place] [address] [phone]

## 2020-04-07 LAB — URINALYSIS, COMPLETE
Bilirubin, UA: NEGATIVE
Glucose, UA: NEGATIVE
Ketones, UA: NEGATIVE
Leukocytes,UA: NEGATIVE
Nitrite, UA: NEGATIVE
Protein,UA: NEGATIVE
RBC, UA: NEGATIVE
Specific Gravity, UA: 1.015 (ref 1.005–1.030)
Urobilinogen, Ur: 0.2 mg/dL (ref 0.2–1.0)
pH, UA: 7 (ref 5.0–7.5)

## 2020-04-07 LAB — MICROSCOPIC EXAMINATION
Bacteria, UA: NONE SEEN
RBC, Urine: NONE SEEN /hpf (ref 0–2)

## 2020-04-08 ENCOUNTER — Telehealth: Payer: Self-pay

## 2020-04-08 NOTE — Telephone Encounter (Signed)
-----   Message from [place], MD sent at 03/27/2020  9:19 PM EDT ----- Please call the patient. I had located two nutritionist that offer virtual consultation. They both come highly recommended. Antonella Dewell, RDN ([email or web address]) and Prudencio Burly, RD ([email or web address]). Thank you!

## 2020-04-08 NOTE — Telephone Encounter (Signed)
My Chart message sent to pt providing pt with Dr. Tarri Glenn response.

## 2020-04-10 LAB — CULTURE, URINE COMPREHENSIVE

## 2020-04-13 ENCOUNTER — Telehealth: Payer: Self-pay

## 2020-04-13 DIAGNOSIS — N3289 Other specified disorders of bladder: Secondary | ICD-10-CM

## 2020-04-13 MED ORDER — CIPROFLOXACIN HCL 250 MG PO TABS: 250.0000 mg | ORAL_TABLET | Freq: Two times a day (BID) | ORAL | 0 refills | Status: DC

## 2020-04-13 NOTE — Telephone Encounter (Signed)
No DPR on file, no answer. Will send patient Mychart msg to make patient aware.

## 2020-04-13 NOTE — Telephone Encounter (Signed)
-----   Message from Bjorn Loser, MD sent at 04/11/2020  4:59 PM EDT -----  Ciprofloxacin 250 mg twice a day for 7 days  ----- Message ----- From: Evelina Bucy, CMA Sent: 04/09/2020  10:44 AM EDT To: Bjorn Loser, MD   ----- Message ----- From: Interface, [facility] Lab Results In Sent: 04/07/2020  11:37 AM EDT To: [facility]

## 2020-04-14 NOTE — Telephone Encounter (Signed)
Patient has picked up Cipro but has not started taking it yet (reports she plans to take it starting Friday). Patient is very concerned that the Cipro may potentially exacerbate her neuropathy and IBS symptoms. She wants to know if ampicillin could replace the Cipro as it has less side effects? Please advise.

## 2020-04-16 ENCOUNTER — Telehealth: Payer: Self-pay

## 2020-04-16 MED ORDER — AMPICILLIN 500 MG PO CAPS: 500.0000 mg | ORAL_CAPSULE | Freq: Three times a day (TID) | ORAL | 0 refills | Status: AC

## 2020-04-16 NOTE — Telephone Encounter (Signed)
Sent in new ABX, notified patient of new ABX, patient verbalized understanding.

## 2020-04-16 NOTE — Telephone Encounter (Signed)
Attempted to reach pt via phone to review her echo results with her, not on file to [facility], however, pt has seen her results on her MyChart, advised to call the clinic with any questions or concerns regarding her results.

## 2020-05-01 NOTE — Telephone Encounter (Signed)
Paperwork placed in Dr. Rometta Emery office in box to review.

## 2020-05-01 NOTE — Telephone Encounter (Signed)
Patient's husband dropped off form.  It's in the rx tower.

## 2020-05-05 ENCOUNTER — Other Ambulatory Visit: Payer: Self-pay | Admitting: Family Medicine

## 2020-05-05 MED ORDER — MAHANA IBS MISC: 1.0000 | Freq: Every day | 0 refills | Status: DC

## 2020-05-05 NOTE — Progress Notes (Signed)
[place] IBS program prescribed per pot wishes. You should be texted by [facility] with instructions.

## 2020-05-06 NOTE — Assessment & Plan Note (Signed)
Stable control on atenolol.  Pt requests referral back to cardiology.

## 2020-05-06 NOTE — Assessment & Plan Note (Signed)
Newly diagnosed on CT incidentally.  She has not started statin ( lipitor 10 mg daily)   She wishes to see cardiology first.   Additionally has hx of   SVT.

## 2020-07-15 ENCOUNTER — Ambulatory Visit (INDEPENDENT_AMBULATORY_CARE_PROVIDER_SITE_OTHER): Payer: Medicare Other | Admitting: Primary Care

## 2020-07-15 ENCOUNTER — Other Ambulatory Visit: Payer: Self-pay

## 2020-07-15 DIAGNOSIS — R229 Localized swelling, mass and lump, unspecified: Secondary | ICD-10-CM

## 2020-07-15 NOTE — Progress Notes (Signed)
Subjective:    Patient ID: Jessica Miles, female    DOB: 1945/05/18, 75 y.o.   MRN: [id number]  HPI  Jessica Miles is a very pleasant 75 y.o. female patient of Dr. Diona Browner with a history of IBS, hemorrhoids, peripheral neuropathy, toenail infection who presents today to discuss skin wound.   The wound is located to the left groin at her underwear line for which she first noticed about 2 days ago with mild bloody drainage. She's had a hard time visualizing the spot, thinks it may be a boil. Yesterday she noticed additional bloody drainage after riding her bicycle. She's been applying OTC antibiotic cream, band-aids, and soaking in Epsom salt.   She denies fevers, redness to her extremity or vagina, pain.   Review of Systems  Constitutional:  Negative for fever.  Skin:  Positive for color change and wound.        Past Medical History:  Diagnosis Date   Allergy    Anxiety    COVID-19 virus infection    09-23-2019- 9-16 had monoclonal infusion    Depression    History of IBS    HPV (human papilloma virus) infection    Hyperlipidemia    82- not taking statin    Loss of taste    Neuropathy 2015   surgery for Mortons neuroma in 1985   Osteopenia    Tachycardia    Tachycardia    Thyroid disease     Social History   Socioeconomic History   Marital status: Married    Spouse name: Juanda Crumble B. Kenna   Number of children: 2   Years of education: MA   Highest education level: Not on file  Occupational History   Occupation: Retired    Comment: Music therapist  Tobacco Use   Smoking status: Never   Smokeless tobacco: Never  Vaping Use   Vaping Use: Never used  Substance and Sexual Activity   Alcohol use: Not Currently   Drug use: No   Sexual activity: Not Currently  Other Topics Concern   Not on file  Social History Narrative   Patient lives at [place] with spouse.   Caffeine Use: 1 cup daily   2 kids- 4 grandchildren- two boys and two girls      Social Determinants of  Health   Financial Resource Strain: Not on file  Food Insecurity: Not on file  Transportation Needs: Not on file  Physical Activity: Not on file  Stress: Not on file  Social Connections: Not on file  Intimate Partner Violence: Not on file    Past Surgical History:  Procedure Laterality Date   COLONOSCOPY  2019   hems only    FOOT NEUROMA SURGERY  1985   WISDOM TOOTH EXTRACTION      Family History  Problem Relation Age of Onset   Heart failure Mother    Heart attack Mother    Heart disease Mother    Heart attack Father    Heart disease Father    Heart attack Maternal Grandfather    Diabetes Son        IDDM   Heart disease Maternal Grandmother    Heart disease Brother    Thyroid disease Neg Hx    Stomach cancer Neg Hx    Esophageal cancer Neg Hx    Colon polyps Neg Hx    Colon cancer Neg Hx    Rectal cancer Neg Hx     Allergies  Allergen Reactions   Fluticasone  Other (See Comments)    Nose bleeds   Mometasone Other (See Comments)    Epistaxis   Fexofenadine Palpitations    Current Outpatient Medications on File Prior to Visit  Medication Sig Dispense Refill   ascorbic acid (VITAMIN C) 500 MG tablet Take 500 mg by mouth 2 (two) times daily.      atenolol (TENORMIN) 25 MG tablet Take 25 mg by mouth daily as needed.     buPROPion (WELLBUTRIN SR) 100 MG 12 hr tablet TAKE 1 TABLET TWICE A DAY 180 tablet 3   Calcium Carb-Cholecalciferol (CALCIUM-VITAMIN D) 600-400 MG-UNIT TABS Take 1 tablet by mouth daily.     Cyanocobalamin (VITAMIN B-12 PO) Take 1 tablet by mouth daily.     Digital Therapy [facility] IBS) MISC 1 Device by Does not apply route daily. as directed 1 each 0   hydrocortisone-pramoxine (ANALPRAM-HC) 2.5-1 % rectal cream Place 1 application rectally as needed for hemorrhoids or anal itching.     levothyroxine (SYNTHROID) 100 MCG tablet Take 1 tablet (100 mcg total) by mouth daily. 90 tablet 3   Lifitegrast (XIIDRA) 5 % SOLN      MAGNESIUM PO Take 1 tablet  by mouth daily.     MELATONIN PO Take 1 tablet by mouth daily.     MULTIPLE VITAMIN PO Take 1 tablet by mouth daily.     Multiple Vitamins-Minerals (ZINC PO) Take 1 tablet by mouth daily.     nitroGLYCERIN (NITRO-DUR) 0.2 mg/hr patch Place 1 patch (0.2 mg total) onto the skin daily. 30 patch 12   NON FORMULARY VSL live probiotic as needed     OVER THE COUNTER MEDICATION CBD nightly     rifaximin (XIFAXAN) 550 MG TABS tablet      thiamine (VITAMIN B-1) 100 MG tablet Take 100 mg by mouth daily.     atorvastatin (LIPITOR) 10 MG tablet Take 1 tablet (10 mg total) by mouth daily. (Patient not taking: Reported on 07/15/2020) 30 tablet 11   No current facility-administered medications on file prior to visit.    BP 110/62   Pulse 79   Temp (!) 97.4 F (36.3 C) (Temporal)   Ht 5\' 6"  (1.676 m)   Wt 109 lb (49.4 kg)   SpO2 100%   BMI 17.59 kg/m  Objective:   Physical Exam Constitutional:      General: She is not in acute distress.    Appearance: She is not ill-appearing.  Skin:    General: Skin is warm and dry.     Findings: No erythema.     Comments: Nearly healed, mostly intact raised bump to the left groin.  Non tender. No surrounding erythema.           Assessment & Plan:      This visit occurred during the SARS-CoV-2 public health emergency.  Safety protocols were in place, including screening questions prior to the visit, additional usage of staff PPE, and extensive cleaning of exam room while observing appropriate contact time as indicated for disinfecting solutions.

## 2020-07-15 NOTE — Assessment & Plan Note (Signed)
Acute to left groin x 2 days. Today the site appears nearly healed, no surrounding erythema, drainage, or complications.  Suspect this could have been an irritated hair follicle given her physical activity level riding her bike in the heat.   Discussed to refrain from riding for the next 2-3 days, protect the site to facilitate complete healing. Return precautions provided.

## 2020-07-15 NOTE — Patient Instructions (Signed)
Refrain from riding your bike for a few more days.  Keep the site covered.   It was a pleasure meeting you!

## 2020-07-31 ENCOUNTER — Telehealth: Payer: Self-pay

## 2020-07-31 NOTE — Telephone Encounter (Signed)
Please make sure the patient understands that if there is any concern that she had a TIA then it makes sense to go get seen now at the emergency room.  I am glad to see the patient, but getting scheduled to see me in the future does not replace that advice.  Thanks.

## 2020-07-31 NOTE — Telephone Encounter (Signed)
Pt called asking to make an appt with Dr Diona Browner for episode of disorientation this past Saturday, July 16. She was afraid she may have had a TIA or something else. I suggested she go to ER and she said she wanted to see Dr Diona Browner 1st as she has a difficult medical history. Dr Diona Browner is out of the office the next 2 weeks. I talked her in to seeing Dr Damita Dunnings Tuesday, July 26 with the understanding that if the episode happens again, she will go to the ER for evaluation. She said she would.

## 2020-07-31 NOTE — Telephone Encounter (Signed)
Left message to call office so I can re-iterate being seen in the ER as I suggested when I 1st spoke to her and per Dr Damita Dunnings.

## 2020-08-04 ENCOUNTER — Other Ambulatory Visit: Payer: Self-pay

## 2020-08-04 ENCOUNTER — Ambulatory Visit (INDEPENDENT_AMBULATORY_CARE_PROVIDER_SITE_OTHER): Payer: Medicare Other | Admitting: Family Medicine

## 2020-08-04 ENCOUNTER — Encounter: Payer: Self-pay | Admitting: Family Medicine

## 2020-08-04 VITALS — BP 112/76 | HR 66 | Temp 97.6°F | Ht 66.0 in | Wt 110.0 lb

## 2020-08-04 DIAGNOSIS — R413 Other amnesia: Secondary | ICD-10-CM | POA: Diagnosis not present

## 2020-08-04 NOTE — Progress Notes (Signed)
This visit occurred during the SARS-CoV-2 public health emergency.  Safety protocols were in place, including screening questions prior to the visit, additional usage of staff PPE, and extensive cleaning of exam room while observing appropriate contact time as indicated for disinfecting solutions.  No recent atenolol use or needed.    She is off statin.  D/w pt.    Med list updated.    She was biking about 12 miles from home.  This was ~7AM on 07/25/20.  She is very familiar with the route.  She was looking down cycling, looked up but didn't recognize where she was.  She recognized the houses but didn't know where she was.  She eventually figured out where she was.  Disorientation lasted 2-3 minutes.  She was alone at the time.  Cautions about biking d/w pt.  No CP.  No syncope.  Not lightheaded.  No focal neuro changes, no weakness.  She still knew who she was and didn't have known speech changes but wasn't talking at the time.  When she saw the railroad tracks she knew where she was and was able to navigate.    She had similar event a few years ago in the early AM when walking and didn't recognize the houses.    No FCNAVD at the time.  She felt well o/w.  She didn't feel dehydrated at the time but hadn't had a lot of fluid that day. No trauma.  She was able to keep her same pace with cycling.  No vision loss, no double vision.    No h/o CVA or MI.    Meds, vitals, and allergies reviewed.   ROS: Per HPI unless specifically indicated in ROS section   GEN: nad, alert and oriented HEENT: ncat NECK: supple w/o LA CV: rrr. PULM: ctab, no inc wob ABD: soft, +bs EXT: no edema SKIN: no acute rash CN 2-12 wnl B, S/S wnl x4 MMSE 30/30  39 minutes were devoted to patient care in this encounter (this includes time spent reviewing the patient's file/history, interviewing and examining the patient, counseling/reviewing plan with patient).

## 2020-08-04 NOTE — Patient Instructions (Signed)
Go to the lab on the way out.   If you have mychart we'll likely use that to update you.    Take care.  Glad to see you. Don't change your meds for now.  Let me see about options after I check your labs.

## 2020-08-05 ENCOUNTER — Other Ambulatory Visit: Payer: Self-pay | Admitting: Family Medicine

## 2020-08-05 DIAGNOSIS — R413 Other amnesia: Secondary | ICD-10-CM | POA: Insufficient documentation

## 2020-08-05 DIAGNOSIS — E039 Hypothyroidism, unspecified: Secondary | ICD-10-CM

## 2020-08-05 LAB — CBC WITH DIFFERENTIAL/PLATELET
Basophils Absolute: 0 10*3/uL (ref 0.0–0.1)
Basophils Relative: 0.7 % (ref 0.0–3.0)
Eosinophils Absolute: 0.1 10*3/uL (ref 0.0–0.7)
Eosinophils Relative: 1.1 % (ref 0.0–5.0)
HCT: 41.2 % (ref 36.0–46.0)
Hemoglobin: 13.8 g/dL (ref 12.0–15.0)
Lymphocytes Relative: 23.7 % (ref 12.0–46.0)
Lymphs Abs: 1.5 10*3/uL (ref 0.7–4.0)
MCHC: 33.6 g/dL (ref 30.0–36.0)
MCV: 92 fl (ref 78.0–100.0)
Monocytes Absolute: 0.4 10*3/uL (ref 0.1–1.0)
Monocytes Relative: 6.6 % (ref 3.0–12.0)
Neutro Abs: 4.3 10*3/uL (ref 1.4–7.7)
Neutrophils Relative %: 67.9 % (ref 43.0–77.0)
Platelets: 208 10*3/uL (ref 150.0–400.0)
RBC: 4.47 Mil/uL (ref 3.87–5.11)
RDW: 13 % (ref 11.5–15.5)
WBC: 6.4 10*3/uL (ref 4.0–10.5)

## 2020-08-05 LAB — COMPREHENSIVE METABOLIC PANEL
ALT: 32 U/L (ref 0–35)
AST: 32 U/L (ref 0–37)
Albumin: 4.5 g/dL (ref 3.5–5.2)
Alkaline Phosphatase: 102 U/L (ref 39–117)
BUN: 21 mg/dL (ref 6–23)
CO2: 30 mEq/L (ref 19–32)
Calcium: 9.9 mg/dL (ref 8.4–10.5)
Chloride: 98 mEq/L (ref 96–112)
Creatinine, Ser: 1.01 mg/dL (ref 0.40–1.20)
GFR: 54.07 mL/min — ABNORMAL LOW (ref >60.00)
Glucose, Bld: 90 mg/dL (ref 70–99)
Potassium: 4.3 mEq/L (ref 3.5–5.1)
Sodium: 135 mEq/L (ref 135–145)
Total Bilirubin: 0.7 mg/dL (ref 0.2–1.2)
Total Protein: 7.2 g/dL (ref 6.0–8.3)

## 2020-08-05 LAB — VITAMIN B12: Vitamin B-12: >1550 pg/mL — ABNORMAL HIGH (ref 211–911)

## 2020-08-05 LAB — TSH: TSH: 0.19 u[IU]/mL — ABNORMAL LOW (ref 0.35–5.50)

## 2020-08-05 MED ORDER — LEVOTHYROXINE SODIUM 100 MCG PO TABS: 100.0000 ug | ORAL_TABLET | Freq: Every day | ORAL | Status: DC

## 2020-08-05 NOTE — Assessment & Plan Note (Signed)
Episodic brief disorientation without focal motor deficit.  She was able to continue biking and eventually recognized her surroundings.  This would be atypical for TIA.  Discussed.  Unclear if she was so focused on cycling that she paid less attention to her surroundings and that led to the transient disorientation.  Discussed routine safety.  She has normal brief testing on memory here and a normal neurologic exam.  I do not suspect an ominous process but it makes sense to check basic labs given her age and her symptoms.  See notes on labs.  Still okay for outpatient follow-up.  I deferred imaging at this point.

## 2020-09-15 ENCOUNTER — Telehealth: Payer: Self-pay | Admitting: Family Medicine

## 2020-09-15 NOTE — Telephone Encounter (Signed)
MADE IN ERROR °

## 2020-10-12 ENCOUNTER — Ambulatory Visit: Payer: Medicare Other

## 2020-10-13 ENCOUNTER — Ambulatory Visit: Payer: Medicare Other

## 2020-10-13 ENCOUNTER — Other Ambulatory Visit: Payer: Medicare Other

## 2020-10-14 ENCOUNTER — Other Ambulatory Visit: Payer: Medicare Other

## 2020-10-20 ENCOUNTER — Encounter: Payer: Self-pay | Admitting: Family Medicine

## 2020-10-20 ENCOUNTER — Other Ambulatory Visit: Payer: Self-pay

## 2020-10-20 ENCOUNTER — Ambulatory Visit (INDEPENDENT_AMBULATORY_CARE_PROVIDER_SITE_OTHER): Payer: Medicare Other | Admitting: Family Medicine

## 2020-10-20 VITALS — BP 102/68 | HR 67 | Temp 98.3°F | Ht 66.0 in | Wt 107.0 lb

## 2020-10-20 DIAGNOSIS — E039 Hypothyroidism, unspecified: Secondary | ICD-10-CM | POA: Diagnosis not present

## 2020-10-20 DIAGNOSIS — I700 Atherosclerosis of aorta: Secondary | ICD-10-CM | POA: Diagnosis not present

## 2020-10-20 DIAGNOSIS — R410 Disorientation, unspecified: Secondary | ICD-10-CM

## 2020-10-20 DIAGNOSIS — I471 Supraventricular tachycardia, unspecified: Secondary | ICD-10-CM

## 2020-10-20 DIAGNOSIS — F331 Major depressive disorder, recurrent, moderate: Secondary | ICD-10-CM | POA: Diagnosis not present

## 2020-10-20 DIAGNOSIS — Z0000 Encounter for general adult medical examination without abnormal findings: Secondary | ICD-10-CM | POA: Diagnosis not present

## 2020-10-20 LAB — LIPID PANEL
Cholesterol: 183 mg/dL (ref 0–200)
HDL: 83.4 mg/dL (ref >39.00)
LDL Cholesterol: 83 mg/dL (ref 0–99)
NonHDL: 99.81
Total CHOL/HDL Ratio: 2
Triglycerides: 85 mg/dL (ref 0.0–149.0)
VLDL: 17 mg/dL (ref 0.0–40.0)

## 2020-10-20 LAB — T4, FREE: Free T4: 0.96 ng/dL (ref 0.60–1.60)

## 2020-10-20 LAB — T3, FREE: T3, Free: 2.7 pg/mL (ref 2.3–4.2)

## 2020-10-20 LAB — TSH: TSH: 0.55 u[IU]/mL (ref 0.35–5.50)

## 2020-10-20 NOTE — Assessment & Plan Note (Addendum)
Acute, recurrent.   Negative work up except TSH low. Nml neuro eval.  Given recurrent nature.. concern for TIA, seizure etc.  Not on asa given GI  SE.  Will refer to neuro for further recommendations.

## 2020-10-20 NOTE — Assessment & Plan Note (Addendum)
Statin indicated.. she did not start.  re-val today.

## 2020-10-20 NOTE — Assessment & Plan Note (Signed)
Chronic, Due for re-eval on inconsistent lower dose.  pt does not want to decrease to 88 mcg daily given was very tired and cold on this dose.

## 2020-10-20 NOTE — Assessment & Plan Note (Signed)
Stable, chronic.  Continue current medication.   Bupropion 100 mg BID.

## 2020-10-20 NOTE — Progress Notes (Signed)
Patient ID: Jessica Miles, female    DOB: [birth year], 75 y.o.   MRN: [id number]  This visit was conducted in person.  BP 102/68   Pulse 67   Temp 98.3 F (36.8 C) (Temporal)   Ht 5\' 6"  (1.676 m)   Wt 107 lb (48.5 kg)   SpO2 99%   BMI 17.27 kg/m    CC: Chief Complaint  Patient presents with   Annual Exam    Subjective:   HPI: Jessica Miles is a 75 y.o. female presenting on 10/20/2020 for AMW.  The patient presents for annual medicare wellness, complete physical and review of chronic health problems. He/She also has the following acute concerns today:   I have personally reviewed the Medicare Annual Wellness questionnaire and have noted 1. The patient's medical and social history 2. Their use of alcohol, tobacco or illicit drugs 3. Their current medications and supplements 4. The patient's functional ability including ADL's, fall risks, home safety risks and hearing or visual             impairment. 5. Diet and physical activities 6. Evidence for depression or mood disorders 7.         Updated provider list Cognitive evaluation was performed and recorded on pt medicare questionnaire form. The patients weight, height, BMI and visual acuity have been recorded in the chart   I have made referrals, counseling and provided education to the patient based review of the above and I have provided the pt with a written personalized care plan for preventive services.   Documentation of this information was scanned into the electronic record under the media tab.   Advance directives and end of life planning reviewed in detail with patient and documented in EMR. Patient given handout on advance care directives if needed. HCPOA and living will updated if needed.  Hearing Screening   500Hz  1000Hz  2000Hz  4000Hz   Right ear 20 40 20 20  Left ear 25 25 25 25   Vision Screening - Comments:: Patient sees an eye doctor yearly, she just seen and got a new Rx for glasses within the last  month.  Fall Risk  10/20/2020 10/10/2019 10/02/2018 08/31/2017 09/27/2016  Falls in the past year? 0 0 0 No No  Number falls in past yr: 0 - - - -  Injury with Fall? 0 - - - -  Comment - - - - -  Risk for fall due to : - - - - -  Risk for fall due to: [place] Office Visit from 10/20/2020 in [place] [facility]  PHQ-2 Total Score 0      MDD: Stable on bupropion.   SVT: 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   Hypothyroid : IN last 3 months she has decreased... using 100 mcg alternating 88 mcg.. but she has no been doing consitently. Lab Results  Component Value Date   TSH 0.19 (L) 08/04/2020   She reports an episode of disorientation... reviewed OV from 07/2020 she was biking, lasted several minutes.. did not know where she was.Similar has happened in the past for  shorter time when walking.  No other symptoms associated.  No other  significant memory loss   noted per family   No family history of memory issues. Not taking ASA given IBS and stomach issues.   She lost weight with COVID .Marland Kitchen working on increasing weight back. Wt Readings from Last 3 Encounters:  10/20/20 107 lb (48.5  kg)  08/04/20 110 lb (49.9 kg)  07/15/20 109 lb (49.4 kg)    Diet: heart healthy Exercise:Avid biker  20 miles at a time, walks frequently.     Relevant past medical, surgical, family and social history reviewed and updated as indicated. Interim medical history since our last visit reviewed. Allergies and medications reviewed and updated. Outpatient Medications Prior to Visit  Medication Sig Dispense Refill   ascorbic acid (VITAMIN C) 500 MG tablet Take 500 mg by mouth 2 (two) times daily.      atenolol (TENORMIN) 25 MG tablet Take 25 mg by mouth daily as needed. (Patient not taking: Reported on 08/04/2020)     buPROPion (WELLBUTRIN SR) 100 MG 12 hr tablet TAKE 1 TABLET TWICE A DAY 180 tablet 3   Calcium Carb-Cholecalciferol (CALCIUM-VITAMIN D) 600-400 MG-UNIT TABS Take  1 tablet by mouth daily.     Digital Therapy [facility] IBS) MISC 1 Device by Does not apply route daily. as directed 1 each 0   hydrocortisone-pramoxine (ANALPRAM-HC) 2.5-1 % rectal cream Place 1 application rectally as needed for hemorrhoids or anal itching.     levothyroxine (SYNTHROID) 100 MCG tablet Take 1 tablet (100 mcg total) by mouth daily. Except for 0.5 tablet on Sunday.  6.5 tablets total per week.     Lifitegrast (XIIDRA) 5 % SOLN      MAGNESIUM PO Take 1 tablet by mouth daily.     MELATONIN PO Take 1 tablet by mouth daily.     MULTIPLE VITAMIN PO Take 1 tablet by mouth daily.     Multiple Vitamins-Minerals (ZINC PO) Take 1 tablet by mouth daily.     nitroGLYCERIN (NITRO-DUR) 0.2 mg/hr patch Place 1 patch (0.2 mg total) onto the skin daily. 30 patch 12   NON FORMULARY VSL live probiotic as needed     OVER THE COUNTER MEDICATION CBD nightly     thiamine (VITAMIN B-1) 100 MG tablet Take 100 mg by mouth daily.     No facility-administered medications prior to visit.     Per HPI unless specifically indicated in ROS section below Review of Systems  Constitutional:  Negative for fatigue and fever.  HENT:  Negative for congestion.   Eyes:  Negative for pain.  Respiratory:  Negative for cough and shortness of breath.   Cardiovascular:  Negative for chest pain, palpitations and leg swelling.  Gastrointestinal:  Negative for abdominal pain.  Genitourinary:  Negative for dysuria and vaginal bleeding.  Musculoskeletal:  Negative for back pain.  Neurological:  Negative for syncope, light-headedness and headaches.  Psychiatric/Behavioral:  Negative for dysphoric mood.   Objective:  BP 102/68   Pulse 67   Temp 98.3 F (36.8 C) (Temporal)   Ht 5\' 6"  (1.676 m)   Wt 107 lb (48.5 kg)   SpO2 99%   BMI 17.27 kg/m   Wt Readings from Last 3 Encounters:  10/20/20 107 lb (48.5 kg)  08/04/20 110 lb (49.9 kg)  07/15/20 109 lb (49.4 kg)      Physical Exam Constitutional:      General:  She is not in acute distress.    Appearance: Normal appearance. She is well-developed. She is not ill-appearing or toxic-appearing.  HENT:     Head: Normocephalic.     Right Ear: Hearing, tympanic membrane, ear canal and external ear normal. Tympanic membrane is not erythematous, retracted or bulging.     Left Ear: Hearing, tympanic membrane, ear canal and external ear normal. Tympanic membrane is not erythematous,  retracted or bulging.     Nose: No mucosal edema or rhinorrhea.     Right Sinus: No maxillary sinus tenderness or frontal sinus tenderness.     Left Sinus: No maxillary sinus tenderness or frontal sinus tenderness.     Mouth/Throat:     Pharynx: Uvula midline.  Eyes:     General: Lids are normal. Lids are everted, no foreign bodies appreciated.     Conjunctiva/sclera: Conjunctivae normal.     Pupils: Pupils are equal, round, and reactive to light.  Neck:     Thyroid: No thyroid mass or thyromegaly.     Vascular: No carotid bruit.     Trachea: Trachea normal.  Cardiovascular:     Rate and Rhythm: Normal rate and regular rhythm.     Pulses: Normal pulses.     Heart sounds: Normal heart sounds, S1 normal and S2 normal. No murmur heard.   No friction rub. No gallop.  Pulmonary:     Effort: Pulmonary effort is normal. No tachypnea or respiratory distress.     Breath sounds: Normal breath sounds. No decreased breath sounds, wheezing, rhonchi or rales.  Abdominal:     General: Bowel sounds are normal.     Palpations: Abdomen is soft.     Tenderness: There is no abdominal tenderness.  Musculoskeletal:     Cervical back: Normal range of motion and neck supple.  Skin:    General: Skin is warm and dry.     Findings: No rash.  Neurological:     Mental Status: She is alert and oriented to person, place, and time.     GCS: GCS eye subscore is 4. GCS verbal subscore is 5. GCS motor subscore is 6.     Cranial Nerves: No cranial nerve deficit.     Sensory: No sensory deficit.      Motor: No abnormal muscle tone.     Coordination: Coordination normal.     Gait: Gait normal.     Deep Tendon Reflexes: Reflexes are normal and symmetric.     Comments: Nml cerebellar exam   No papilledema  Psychiatric:        Mood and Affect: Mood is not anxious or depressed.        Speech: Speech normal.        Behavior: Behavior normal. Behavior is cooperative.        Thought Content: Thought content normal.        Cognition and Memory: Memory is not impaired. She does not exhibit impaired recent memory or impaired remote memory.        Judgment: Judgment normal.      Results for orders placed or performed in visit on 08/04/20  Comprehensive metabolic panel  Result Value Ref Range   Sodium 135 135 - 145 mEq/L   Potassium 4.3 3.5 - 5.1 mEq/L   Chloride 98 96 - 112 mEq/L   CO2 30 19 - 32 mEq/L   Glucose, Bld 90 70 - 99 mg/dL   BUN 21 6 - 23 mg/dL   Creatinine, Ser 1.01 0.40 - 1.20 mg/dL   Total Bilirubin 0.7 0.2 - 1.2 mg/dL   Alkaline Phosphatase 102 39 - 117 U/L   AST 32 0 - 37 U/L   ALT 32 0 - 35 U/L   Total Protein 7.2 6.0 - 8.3 g/dL   Albumin 4.5 3.5 - 5.2 g/dL   GFR 54.07 (L) >60.00 mL/min   Calcium 9.9 8.4 - 10.5 mg/dL  CBC with  Differential/Platelet  Result Value Ref Range   WBC 6.4 4.0 - 10.5 K/uL   RBC 4.47 3.87 - 5.11 Mil/uL   Hemoglobin 13.8 12.0 - 15.0 g/dL   HCT 41.2 36.0 - 46.0 %   MCV 92.0 78.0 - 100.0 fl   MCHC 33.6 30.0 - 36.0 g/dL   RDW 13.0 11.5 - 15.5 %   Platelets 208.0 150.0 - 400.0 K/uL   Neutrophils Relative % 67.9 43.0 - 77.0 %   Lymphocytes Relative 23.7 12.0 - 46.0 %   Monocytes Relative 6.6 3.0 - 12.0 %   Eosinophils Relative 1.1 0.0 - 5.0 %   Basophils Relative 0.7 0.0 - 3.0 %   Neutro Abs 4.3 1.4 - 7.7 K/uL   Lymphs Abs 1.5 0.7 - 4.0 K/uL   Monocytes Absolute 0.4 0.1 - 1.0 K/uL   Eosinophils Absolute 0.1 0.0 - 0.7 K/uL   Basophils Absolute 0.0 0.0 - 0.1 K/uL  TSH  Result Value Ref Range   TSH 0.19 (L) 0.35 - 5.50 uIU/mL  Vitamin  B12  Result Value Ref Range   Vitamin B-12 >1550 (H) 211 - 911 pg/mL    This visit occurred during the SARS-CoV-2 public health emergency.  Safety protocols were in place, including screening questions prior to the visit, additional usage of staff PPE, and extensive cleaning of exam room while observing appropriate contact time as indicated for disinfecting solutions.   COVID 19 screen:  No recent travel or known exposure to COVID19 The patient denies respiratory symptoms of COVID 19 at this time. The importance of social distancing was discussed today.   Assessment and Plan   The patient's preventative maintenance and recommended screening tests for an annual wellness exam were reviewed in full today. Brought up to date unless services declined.  Counselled on the importance of diet, exercise, and its role in overall health and mortality. The patient's FH and SH was reviewed, including their home life, tobacco status, and drug and alcohol status.   Vaccines: uptodate flu, S/P COVID vaccine series x 3 ,  per pt she has shingrix. Due for TDap but not covered. Pap/DVE: not indicated Mammo: 10/2019 nml Bone Density: 2015 osteopenia, 10/2019  worsened per pt.. Was on fosamax for year, she is hesitant about prolia. Colon:2022, no further indicated  Smoking Status:none ETOH/ drug HFW:YOVZ/CHYI  Hep C: neg  Problem List Items Addressed This Visit     Aortic atherosclerosis ([place])    Statin indicated.. she did not start.  re-val today.       Relevant Orders   Lipid panel   Hypothyroidism    Chronic, Due for re-eval on inconsistent lower dose.  pt does not want to decrease to 88 mcg daily given was very tired and cold on this dose.      Relevant Orders   T3, free   T4, free   TSH   Major depressive disorder, recurrent episode, moderate (HCC)    Stable, chronic.  Continue current medication.   Bupropion 100 mg BID.      Medicare annual wellness visit, subsequent - Primary    SVT (supraventricular tachycardia) (HCC)    Stable, chronic.  Continue current medication.    tenormin.      Transient disorientation    Acute, recurrent.   Negative work up except TSH low. Nml neuro eval.  Given recurrent nature.. concern for TIA, seizure etc.  Not on asa given GI  SE.  Will refer to neuro for further recommendations.  Relevant Orders   Ambulatory referral to Neurology    Eliezer Lofts, MD

## 2020-10-20 NOTE — Assessment & Plan Note (Signed)
Stable, chronic.  Continue current medication.    tenormin.

## 2020-10-20 NOTE — Patient Instructions (Addendum)
Please stop at the lab to have labs drawn.  Send [place] record of the dates of  COVID booster and shingrix.  We will work on a neurology referral for your spell of disorientation.  Consider  Prolia for  osteoporosis.  Please call the location of your choice from the menu below to schedule your Mammogram and/or Bone Density appointment.     [place] at [facility]   Phone:  [phone]   [place], [place] [address]                                            Services: 3D Mammogram and [place]  [place] at [facility])  Phone:  [phone]   [address] Room [place], [place] [address]                                              Services:  3D Mammogram and Bone Density

## 2020-10-21 ENCOUNTER — Other Ambulatory Visit: Payer: Self-pay | Admitting: Family Medicine

## 2020-10-21 DIAGNOSIS — Z1231 Encounter for screening mammogram for malignant neoplasm of breast: Secondary | ICD-10-CM

## 2020-10-21 DIAGNOSIS — E039 Hypothyroidism, unspecified: Secondary | ICD-10-CM

## 2020-10-21 MED ORDER — LEVOTHYROXINE SODIUM 100 MCG PO TABS: 100.0000 ug | ORAL_TABLET | Freq: Every day | ORAL | 3 refills | Status: DC

## 2020-11-25 NOTE — Telephone Encounter (Signed)
Bone Density results were placed in My To Do box.  I did call patient as instructed on report by Dr. Diona Browner and left a message for her to return my call in regards to these results.  After hanging up,  I realized these results are from 10/31/2019.   See previous MyChart discussion from 10/20/2020.  Results were requested because there was not a copy of these report in patient's chart. Then we received this MyChart response from patient today.  Will have Dr. Diona Browner to address this with Mrs. Stolp.

## 2020-12-09 ENCOUNTER — Encounter: Payer: Self-pay | Admitting: *Deleted

## 2020-12-22 ENCOUNTER — Encounter: Payer: Self-pay | Admitting: Family Medicine

## 2020-12-23 ENCOUNTER — Telehealth: Payer: Self-pay | Admitting: Diagnostic Neuroimaging

## 2020-12-23 ENCOUNTER — Encounter: Payer: Self-pay | Admitting: Diagnostic Neuroimaging

## 2020-12-23 ENCOUNTER — Ambulatory Visit (INDEPENDENT_AMBULATORY_CARE_PROVIDER_SITE_OTHER): Payer: Medicare Other | Admitting: Diagnostic Neuroimaging

## 2020-12-23 VITALS — BP 113/71 | HR 94 | Ht 66.0 in | Wt 109.4 lb

## 2020-12-23 DIAGNOSIS — G454 Transient global amnesia: Secondary | ICD-10-CM | POA: Diagnosis not present

## 2020-12-23 NOTE — Progress Notes (Signed)
[facility]  PATIENT: Jessica Miles DOB: [birth year]  REFERRING CLINICIAN: Jinny Sanders, MD HISTORY FROM: patient  REASON FOR VISIT: new consult   HISTORICAL  CHIEF COMPLAINT:  Chief Complaint  Patient presents with   Transient disorientation    Rm 6  New Pt "was biking and looked up and didn't know where I was-lasted 7 minutes, happened once before while walking in my neighborhood     HISTORY OF PRESENT ILLNESS:   75 year old female here for evaluation of transient confusion.  07/25/2020 patient was riding her bike on a 20 mile route, when halfway through she felt confused and could not recognize where she was.  This lasted for a few minutes and then resolved.  She had a similar event in 2020 when she was out walking in her neighborhood, could not recognize her surroundings and then it resolved.  No loss of consciousness.  No weakness, facial droop, headaches, vision changes.   REVIEW OF SYSTEMS: Full 14 system review of systems performed and negative with exception of: as per HPI.  ALLERGIES: Allergies  Allergen Reactions   Fluticasone Other (See Comments)    Nose bleeds   Mometasone Other (See Comments)    Epistaxis   Fexofenadine Palpitations    HOME MEDICATIONS: Outpatient Medications Prior to Visit  Medication Sig Dispense Refill   ascorbic acid (VITAMIN C) 500 MG tablet Take 500 mg by mouth 2 (two) times daily.      atenolol (TENORMIN) 25 MG tablet Take 25 mg by mouth daily as needed.     buPROPion (WELLBUTRIN SR) 100 MG 12 hr tablet TAKE 1 TABLET TWICE A DAY 180 tablet 3   Calcium Carb-Cholecalciferol (CALCIUM-VITAMIN D) 600-400 MG-UNIT TABS Take 1 tablet by mouth daily.     Digital Therapy [facility] IBS) MISC 1 Device by Does not apply route daily. as directed 1 each 0   hydrocortisone-pramoxine (ANALPRAM-HC) 2.5-1 % rectal cream Place 1 application rectally as needed for hemorrhoids or anal itching.     levothyroxine (SYNTHROID) 100 MCG tablet  Take 1 tablet (100 mcg total) by mouth daily. Except for 0.5 tablet on Sunday.  6.5 tablets total per week. 90 tablet 3   Lifitegrast (XIIDRA) 5 % SOLN      MAGNESIUM PO Take 1 tablet by mouth daily.     MELATONIN PO Take 1 tablet by mouth daily.     MULTIPLE VITAMIN PO Take 1 tablet by mouth daily.     Multiple Vitamins-Minerals (ZINC PO) Take 1 tablet by mouth daily.     nitroGLYCERIN (NITRO-DUR) 0.2 mg/hr patch Place 1 patch (0.2 mg total) onto the skin daily. 30 patch 12   NON FORMULARY VSL live probiotic as needed     OVER THE COUNTER MEDICATION CBD nightly     thiamine (VITAMIN B-1) 100 MG tablet Take 100 mg by mouth daily.     No facility-administered medications prior to visit.    PAST MEDICAL HISTORY: Past Medical History:  Diagnosis Date   Allergy    Anxiety    COVID-19 virus infection    09-23-2019- 9-16 had monoclonal infusion    Depression    History of IBS    HPV (human papilloma virus) infection    Hyperlipidemia    82- not taking statin    Loss of taste    Neuropathy 2015   surgery for Mortons neuroma in 1985   Osteopenia    Raynaud's disease 02/2020   Tachycardia    Thyroid disease  PAST SURGICAL HISTORY: Past Surgical History:  Procedure Laterality Date   COLONOSCOPY  2019   hems only    FOOT NEUROMA SURGERY  1985   WISDOM TOOTH EXTRACTION      FAMILY HISTORY: Family History  Problem Relation Age of Onset   Stroke Mother 81   Heart failure Mother    Heart attack Mother    Heart disease Mother    Heart attack Father 51   Heart disease Father    Heart disease Brother    Heart failure Brother        CHF   Heart disease Maternal Grandmother    Heart attack Maternal Grandfather    Diabetes Son        IDDM   Stroke Paternal Uncle    Thyroid disease Neg Hx    Stomach cancer Neg Hx    Esophageal cancer Neg Hx    Colon polyps Neg Hx    Colon cancer Neg Hx    Rectal cancer Neg Hx     SOCIAL HISTORY: Social History   Socioeconomic  History   Marital status: Married    Spouse name: Juanda Crumble B. Leider   Number of children: 2   Years of education: MA   Highest education level: Not on file  Occupational History   Occupation: Retired    Comment: Music therapist  Tobacco Use   Smoking status: Never   Smokeless tobacco: Never  Vaping Use   Vaping Use: Never used  Substance and Sexual Activity   Alcohol use: Not Currently   Drug use: No   Sexual activity: Not Currently  Other Topics Concern   Not on file  Social History Narrative   12/23/20 Patient lives at [facility] with spouse.   Caffeine Use: 1 cup daily   2 kids- 4 grandchildren- two boys and two girls      Social Determinants of Health   Financial Resource Strain: Not on file  Food Insecurity: Not on file  Transportation Needs: Not on file  Physical Activity: Not on file  Stress: Not on file  Social Connections: Not on file  Intimate Partner Violence: Not on file     PHYSICAL EXAM  GENERAL EXAM/CONSTITUTIONAL: Vitals:  Vitals:   12/23/20 0844  BP: 113/71  Pulse: 94  Weight: 109 lb 6.4 oz (49.6 kg)  Height: 5\' 6"  (1.676 m)   Body mass index is 17.66 kg/m. Wt Readings from Last 3 Encounters:  12/23/20 109 lb 6.4 oz (49.6 kg)  10/20/20 107 lb (48.5 kg)  08/04/20 110 lb (49.9 kg)   Patient is in no distress; well developed, nourished and groomed; neck is supple  CARDIOVASCULAR: Examination of carotid arteries is normal; no carotid bruits Regular rate and rhythm, no murmurs Examination of peripheral vascular system by observation and palpation is normal  EYES: Ophthalmoscopic exam of optic discs and posterior segments is normal; no papilledema or hemorrhages No results found.  MUSCULOSKELETAL: Gait, strength, tone, movements noted in Neurologic exam below  NEUROLOGIC: MENTAL STATUS:  No flowsheet data found. awake, alert, oriented to person, place and time recent and remote memory intact normal attention and  concentration language fluent, comprehension intact, naming intact fund of knowledge appropriate  CRANIAL NERVE:  2nd - no papilledema on fundoscopic exam 2nd, 3rd, 4th, 6th - pupils equal and reactive to light, visual fields full to confrontation, extraocular muscles intact, no nystagmus 5th - facial sensation symmetric 7th - facial strength symmetric 8th - hearing intact 9th - palate  elevates symmetrically, uvula midline 11th - shoulder shrug symmetric 12th - tongue protrusion midline  MOTOR:  normal bulk and tone, full strength in the BUE, BLE  SENSORY:  normal and symmetric to light touch, temperature, vibration  COORDINATION:  finger-nose-finger, fine finger movements normal  REFLEXES:  deep tendon reflexes trace and symmetric  GAIT/STATION:  narrow based gait     DIAGNOSTIC DATA (LABS, IMAGING, TESTING) - I reviewed patient records, labs, notes, testing and imaging myself where available.  Lab Results  Component Value Date   WBC 6.4 08/04/2020   HGB 13.8 08/04/2020   HCT 41.2 08/04/2020   MCV 92.0 08/04/2020   PLT 208.0 08/04/2020      Component Value Date/Time   NA 135 08/04/2020 1446   K 4.3 08/04/2020 1446   CL 98 08/04/2020 1446   CO2 30 08/04/2020 1446   GLUCOSE 90 08/04/2020 1446   BUN 21 08/04/2020 1446   CREATININE 1.01 08/04/2020 1446   CALCIUM 9.9 08/04/2020 1446   PROT 7.2 08/04/2020 1446   PROT 7.3 08/20/2013 1005   ALBUMIN 4.5 08/04/2020 1446   AST 32 08/04/2020 1446   ALT 32 08/04/2020 1446   ALKPHOS 102 08/04/2020 1446   BILITOT 0.7 08/04/2020 1446   Lab Results  Component Value Date   CHOL 183 10/20/2020   HDL 83.40 10/20/2020   LDLCALC 83 10/20/2020   TRIG 85.0 10/20/2020   CHOLHDL 2 10/20/2020   Lab Results  Component Value Date   HGBA1C 5.6 09/10/2015   Lab Results  Component Value Date   VITAMINB12 >1550 (H) 08/04/2020   Lab Results  Component Value Date   TSH 0.55 10/20/2020    11/25/15 MRI brain (with and  without). No acute findings. Also noted is an incidental 80mm right cerebellar cystic focus (stable from MRI on 10/29/13) without associated gliosis, likely a benign cyst.   11/25/15 MRI cervical spine (with and without) demonstrating: 1. At C6-7: disc bulging and uncovertebral joint hypertrophy with moderate right and mild left foraminal stenosis. 2. At C5-6: disc bulging and uncovertebral joint hypertrophy with mild biforaminal stenosis.    ASSESSMENT AND PLAN  75 y.o. year old female here with transient disorientation, confusion lasting a few minutes in 2020 and 2022.  Suspicious for transient global amnesia versus daydreaming/decreased attention.   Dx:  1. TGA (transient global amnesia)     PLAN:  TRANSIENT DISORIENTATION (2020, 2022) - unclear etiology; could have been daydreaming event vs transient global amnesia; check MRI brain and EEG  Orders Placed This Encounter  Procedures   MR BRAIN W WO CONTRAST   EEG adult   Return for pending if symptoms worsen or fail to improve, pending test results.    Penni Bombard, MD 04/54/0981, 1:91 AM Certified in Neurology, Neurophysiology and Neuroimaging  [facility] [address] [place], [place] [address] [phone]

## 2020-12-23 NOTE — Patient Instructions (Signed)
°  TRANSIENT DISORIENTATION (2020, 2022) - unclear etiology; could have been daydreaming event vs transient global amnesia; consider MRI brain and EEG

## 2020-12-23 NOTE — Telephone Encounter (Signed)
Medicare/tricare order sent to [facility], [facility] they will reach out to the patient to schedule.  °

## 2020-12-24 MED ORDER — HYDROCORT-PRAMOXINE (PERIANAL) 2.5-1 % EX CREA: 1.0000 "application " | TOPICAL_CREAM | CUTANEOUS | 1 refills | Status: DC | PRN

## 2020-12-25 ENCOUNTER — Encounter: Payer: Self-pay | Admitting: Diagnostic Neuroimaging

## 2020-12-30 ENCOUNTER — Ambulatory Visit (INDEPENDENT_AMBULATORY_CARE_PROVIDER_SITE_OTHER): Payer: Medicare Other | Admitting: Diagnostic Neuroimaging

## 2020-12-30 ENCOUNTER — Other Ambulatory Visit: Payer: Self-pay

## 2020-12-30 DIAGNOSIS — G454 Transient global amnesia: Secondary | ICD-10-CM

## 2020-12-30 DIAGNOSIS — R410 Disorientation, unspecified: Secondary | ICD-10-CM

## 2021-01-05 ENCOUNTER — Other Ambulatory Visit: Payer: Self-pay

## 2021-01-05 ENCOUNTER — Ambulatory Visit
Admission: RE | Admit: 2021-01-05 | Discharge: 2021-01-05 | Disposition: A | Discharge status: Home / Self Care | Payer: Medicare Other | Source: Ambulatory Visit | Attending: Diagnostic Neuroimaging | Admitting: Diagnostic Neuroimaging

## 2021-01-05 DIAGNOSIS — G454 Transient global amnesia: Secondary | ICD-10-CM

## 2021-01-05 MED ORDER — GADOBENATE DIMEGLUMINE 529 MG/ML IV SOLN
9.0000 mL | Freq: Once | INTRAVENOUS | Status: AC | PRN
  Administered 2021-01-05: 11:00:00 9 mL via INTRAVENOUS

## 2021-01-07 ENCOUNTER — Encounter: Payer: Self-pay | Admitting: Diagnostic Neuroimaging

## 2021-01-09 ENCOUNTER — Other Ambulatory Visit: Payer: Self-pay | Admitting: Family Medicine

## 2021-01-14 NOTE — Procedures (Signed)
° °  [facility]  EEG (ELECTROENCEPHALOGRAM) REPORT   STUDY DATE: 12/30/20 PATIENT NAME: Jessica Miles DOB: 1945/03/02 MRN: [id number]  ORDERING CLINICIAN: Andrey Spearman, MD   TECHNOLOGIST: Myer Peer TECHNIQUE: Electroencephalogram was recorded utilizing standard 10-20 system of lead placement and reformatted into average and bipolar montages.  RECORDING TIME: 23 minutes ACTIVATION: hyperventilation and photic stimulation  CLINICAL INFORMATION: 76 year old female with transient confusion.  FINDINGS: Posterior dominant background rhythms, which attenuate with eye opening, ranging 12-13 hertz and 20-30 microvolts. No focal, lateralizing, epileptiform activity or seizures are seen. Patient recorded in the awake and drowsy state. EKG channel shows regular rhythm of 60-70 beats per minute.   IMPRESSION:   Normal EEG in the awake and drowsy states.   INTERPRETING PHYSICIAN:  Penni Bombard, MD Certified in Neurology, Neurophysiology and Neuroimaging  [facility] [address], [place], [place] [address] [phone]

## 2021-01-22 ENCOUNTER — Encounter: Payer: Self-pay | Admitting: Family Medicine

## 2021-01-25 MED ORDER — REGULORA MISC: 1.0000 "application " | Freq: Every day | 0 refills | Status: DC

## 2021-01-27 ENCOUNTER — Telehealth: Payer: Self-pay | Admitting: *Deleted

## 2021-01-27 NOTE — Telephone Encounter (Signed)
Received request for PA for Regulora.  PA completed on CoverMyMeds.  PA denied.  Drug is not covered by plan.

## 2021-02-01 ENCOUNTER — Encounter: Payer: Self-pay | Admitting: *Deleted

## 2021-02-10 ENCOUNTER — Encounter: Payer: Self-pay | Admitting: Podiatrist

## 2021-02-10 ENCOUNTER — Other Ambulatory Visit: Payer: Self-pay | Admitting: Podiatrist

## 2021-02-10 ENCOUNTER — Other Ambulatory Visit: Payer: Self-pay

## 2021-02-10 ENCOUNTER — Ambulatory Visit (INDEPENDENT_AMBULATORY_CARE_PROVIDER_SITE_OTHER): Payer: Medicare Other

## 2021-02-10 ENCOUNTER — Ambulatory Visit (INDEPENDENT_AMBULATORY_CARE_PROVIDER_SITE_OTHER): Payer: Medicare Other | Admitting: Podiatrist

## 2021-02-10 VITALS — Temp 97.9°F

## 2021-02-10 DIAGNOSIS — M19071 Primary osteoarthritis, right ankle and foot: Secondary | ICD-10-CM

## 2021-02-10 DIAGNOSIS — M19079 Primary osteoarthritis, unspecified ankle and foot: Secondary | ICD-10-CM

## 2021-02-10 DIAGNOSIS — M109 Gout, unspecified: Secondary | ICD-10-CM | POA: Diagnosis not present

## 2021-02-10 DIAGNOSIS — L03115 Cellulitis of right lower limb: Secondary | ICD-10-CM

## 2021-02-10 MED ORDER — KETOCONAZOLE 2 % EX CREA: TOPICAL_CREAM | CUTANEOUS | 2 refills | Status: DC

## 2021-02-10 MED ORDER — DEXAMETHASONE SODIUM PHOSPHATE 120 MG/30ML IJ SOLN
4.0000 mg | Freq: Once | INTRAMUSCULAR | Status: AC
  Administered 2021-02-10: 4 mg via INTRA_ARTICULAR

## 2021-02-10 NOTE — Patient Instructions (Signed)

## 2021-02-10 NOTE — Progress Notes (Signed)
Chief Complaint  Patient presents with   Nail Problem    Patient presents today for painful 3rd right toe, redness spreading into the top of her foot x 2-3 days.  She says it was throbbing last night and tender to touch on the toe.  She has been using her nitro patches which helps some     HPI: Patient is 76 y.o. female who presents today for pain in the right third toe. She relates the toe became swollen and red.  She states this has been going on for the last 2-3 days. She relates she had in the past a superficial ulceration that was slow to heal due to Raynauds Disease.  She used nitroglycerin patches that ultimately allowed the ulceration to fully heal.  She relates an area of tenderness on the lateral side of the right third toe.  Overall states the entire toe is painful and swollen.     Allergies  Allergen Reactions   Fluticasone Other (See Comments)    Nose bleeds   Mometasone Other (See Comments)    Epistaxis   Fexofenadine Palpitations    Review of systems is negative except as noted in the HPI.  Denies nausea/ vomiting/ fevers/ chills or night sweats.   Denies difficulty breathing, denies calf pain or tenderness  Physical Exam  Patient is awake, alert, and oriented x 3.  In no acute distress.    Vascular status is intact with palpable pedal pulses DP and PT bilateral and capillary refill time less than 3 seconds bilateral.  Cool temperature to the toes of the right foot is noted with decreased digital hair growth.  Redness and swelling of the third toe in comparison is noted.    Neurological exam reveals epicritic and protective sensation grossly intact bilateral.   Dermatological exam reveals skin is supple and dry to bilateral feet.  Superficial scaling on the lateral side of the right third toe.  It appears to be a superficial fungal infection-  no break in the skin is noted.  No abnormality with the nail is seen to suggest bacterial infection.   Musculoskeletal exam:  Musculature intact with dorsiflexion, plantarflexion, inversion, eversion. Ankle and First MPJ joint range of motion normal.  Pain at the distal interphalangeal joint of the right third toe is noted.  Redness of the third toe is also seen.   Xray: 3 views of the right foot are obtained.  Soft tissue swelling is noted on the right third toe.  No emphysema within the soft tissue structures is seen.  Small joint mouse is noted at the distal interphalangeal joint on the oblique view.  No acute osseous changes are seen   Assessment:   ICD-10-CM   1. 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  Discussed that I do not see a break in the skin and therefore it appears to be an inflammatory condition.  I recommended trying an injection of steroid into the base of the toe to see if this helps with the inflammation and swelling.  The patient agreed this was carried out today under sterile technique infiltrating dexamethasone 4 mg and 0.5% Marcaine plain at the date base of the toe.  The patient tolerated this well.  I also recommended some Castellani's paint and ketoconazole cream to use between the third and fourth  toes.  If there is no improvement in symptoms in the next couple days she is instructed to call.  I also advise she continue using the nitroglycerin patches while the toe is swollen to help improve blood flow.

## 2021-02-11 ENCOUNTER — Ambulatory Visit (INDEPENDENT_AMBULATORY_CARE_PROVIDER_SITE_OTHER): Payer: Medicare Other | Admitting: Family Medicine

## 2021-02-11 ENCOUNTER — Encounter: Payer: Self-pay | Admitting: Family Medicine

## 2021-02-11 VITALS — BP 110/60 | HR 79 | Temp 97.8°F | Ht 66.0 in | Wt 109.5 lb

## 2021-02-11 DIAGNOSIS — L723 Sebaceous cyst: Secondary | ICD-10-CM | POA: Insufficient documentation

## 2021-02-11 NOTE — Progress Notes (Signed)
Patient ID: Jessica Miles, female    DOB: 01/12/1945, 76 y.o.   MRN: [id number]  This visit was conducted in person.  BP 110/60    Pulse 79    Temp 97.8 F (36.6 C) (Temporal)    Ht 5\' 6"  (1.676 m)    Wt 109 lb 8 oz (49.7 kg)    SpO2 98%    BMI 17.67 kg/m    CC: Chief Complaint  Patient presents with   Sore in Groin Area    Subjective:   HPI: Jessica Miles is a 76 y.o. female presenting on 02/11/2021 for Sore in Groin Area   She has noted a lesion in left groin off and on in last 6 month .. feels like a small pimple but in last 2 weeks it has been increasing in pain and size.  Causes pain with exercise   Got a steroid injection in foot for gout yesterday.. this has decreased it in size.   She is treating hemorrhoid wipes, peroxide and antibiotics cream.   No known discharge.   No fever.  No vaginal discharge.  Reviewed OV from yesterday for gout/steroid injection.     Relevant past medical, surgical, family and social history reviewed and updated as indicated. Interim medical history since our last visit reviewed. Allergies and medications reviewed and updated. Outpatient Medications Prior to Visit  Medication Sig Dispense Refill   ascorbic acid (VITAMIN C) 500 MG tablet Take 500 mg by mouth 2 (two) times daily.      atenolol (TENORMIN) 25 MG tablet Take 25 mg by mouth daily as needed.     buPROPion ER (WELLBUTRIN SR) 100 MG 12 hr tablet TAKE 1 TABLET TWICE A DAY 180 tablet 1   Calcium Carb-Cholecalciferol (CALCIUM-VITAMIN D) 600-400 MG-UNIT TABS Take 1 tablet by mouth daily.     DTx App - Gastrointestinal (REGULORA) MISC 1 application by Does not apply route daily. Pt cell [phone] 1 each 0   hydrocortisone-pramoxine (ANALPRAM-HC) 2.5-1 % rectal cream Place 1 application rectally as needed for hemorrhoids or anal itching. 30 g 1   ketoconazole (NIZORAL) 2 % cream Apply between the third and fourth toe twice a day 60 g 2   levothyroxine (SYNTHROID) 100 MCG tablet Take 1  tablet (100 mcg total) by mouth daily. Except for 0.5 tablet on Sunday.  6.5 tablets total per week. 90 tablet 3   Lifitegrast (XIIDRA) 5 % SOLN      MAGNESIUM PO Take 1 tablet by mouth daily.     MELATONIN PO Take 1 tablet by mouth daily.     MULTIPLE VITAMIN PO Take 1 tablet by mouth daily.     Multiple Vitamins-Minerals (ZINC PO) Take 1 tablet by mouth daily.     nitroGLYCERIN (NITRO-DUR) 0.2 mg/hr patch Place 1 patch (0.2 mg total) onto the skin daily. 30 patch 12   NON FORMULARY VSL live probiotic as needed     OVER THE COUNTER MEDICATION CBD nightly     thiamine (VITAMIN B-1) 100 MG tablet Take 100 mg by mouth daily.     No facility-administered medications prior to visit.     Per HPI unless specifically indicated in ROS section below Review of Systems  Constitutional:  Negative for fatigue and fever.  HENT:  Negative for ear pain.   Eyes:  Negative for pain.  Respiratory:  Negative for chest tightness and shortness of breath.   Cardiovascular:  Negative for chest pain, palpitations and leg swelling.  Gastrointestinal:  Negative for abdominal pain.  Genitourinary:  Negative for dysuria.  Objective:  BP 110/60    Pulse 79    Temp 97.8 F (36.6 C) (Temporal)    Ht 5\' 6"  (1.676 m)    Wt 109 lb 8 oz (49.7 kg)    SpO2 98%    BMI 17.67 kg/m   Wt Readings from Last 3 Encounters:  02/11/21 109 lb 8 oz (49.7 kg)  12/23/20 109 lb 6.4 oz (49.6 kg)  10/20/20 107 lb (48.5 kg)      Physical Exam Constitutional:      General: She is not in acute distress.    Appearance: Normal appearance. She is well-developed. She is not ill-appearing or toxic-appearing.  HENT:     Head: Normocephalic.     Right Ear: Hearing, tympanic membrane, ear canal and external ear normal. Tympanic membrane is not erythematous, retracted or bulging.     Left Ear: Hearing, tympanic membrane, ear canal and external ear normal. Tympanic membrane is not erythematous, retracted or bulging.     Nose: No mucosal edema  or rhinorrhea.     Right Sinus: No maxillary sinus tenderness or frontal sinus tenderness.     Left Sinus: No maxillary sinus tenderness or frontal sinus tenderness.     Mouth/Throat:     Pharynx: Uvula midline.  Eyes:     General: Lids are normal. Lids are everted, no foreign bodies appreciated.     Conjunctiva/sclera: Conjunctivae normal.     Pupils: Pupils are equal, round, and reactive to light.  Neck:     Thyroid: No thyroid mass or thyromegaly.     Vascular: No carotid bruit.     Trachea: Trachea normal.  Cardiovascular:     Rate and Rhythm: Normal rate and regular rhythm.     Pulses: Normal pulses.     Heart sounds: Normal heart sounds, S1 normal and S2 normal. No murmur heard.   No friction rub. No gallop.  Pulmonary:     Effort: Pulmonary effort is normal. No tachypnea or respiratory distress.     Breath sounds: Normal breath sounds. No decreased breath sounds, wheezing, rhonchi or rales.  Abdominal:     General: Bowel sounds are normal.     Palpations: Abdomen is soft.     Tenderness: There is no abdominal tenderness.  Genitourinary:    Labia:        Right: No rash, tenderness or lesion.        Left: No rash, tenderness or lesion.   Musculoskeletal:     Cervical back: Normal range of motion and neck supple.  Skin:    General: Skin is warm and dry.     Findings: No rash.  Neurological:     Mental Status: She is alert.  Psychiatric:        Mood and Affect: Mood is not anxious or depressed.        Speech: Speech normal.        Behavior: Behavior normal. Behavior is cooperative.        Thought Content: Thought content normal.        Judgment: Judgment normal.      Results for orders placed or performed in visit on 10/20/20  T3, free  Result Value Ref Range   T3, Free 2.7 2.3 - 4.2 pg/mL  T4, free  Result Value Ref Range   Free T4 0.96 0.60 - 1.60 ng/dL  TSH  Result Value Ref Range   TSH 0.55 0.35 -  5.50 uIU/mL  Lipid panel  Result Value Ref Range    Cholesterol 183 0 - 200 mg/dL   Triglycerides 85.0 0.0 - 149.0 mg/dL   HDL 83.40 >39.00 mg/dL   VLDL 17.0 0.0 - 40.0 mg/dL   LDL Cholesterol 83 0 - 99 mg/dL   Total CHOL/HDL Ratio 2    NonHDL 99.81     This visit occurred during the SARS-CoV-2 public health emergency.  Safety protocols were in place, including screening questions prior to the visit, additional usage of staff PPE, and extensive cleaning of exam room while observing appropriate contact time as indicated for disinfecting solutions.   COVID 19 screen:  No recent travel or known exposure to COVID19 The patient denies respiratory symptoms of COVID 19 at this time. The importance of social distancing was discussed today.   Assessment and Plan    Problem List Items Addressed This Visit     Inflamed sebaceous cyst - Primary    Improved post steroid injection for gout in foot.  Most likely inflamed sebaceous cyst.  If recurs treat with warm compress, wash with warm soapy water and can apply topical antibiotic or cortisone 10 cream.        Eliezer Lofts, MD

## 2021-02-11 NOTE — Assessment & Plan Note (Addendum)
Improved post steroid injection for gout in foot.  Most likely inflamed sebaceous cyst.  If recurs treat with warm compress, wash with warm soapy water and can apply topical antibiotic or cortisone 10 cream.

## 2021-02-11 NOTE — Patient Instructions (Signed)
If area recurs.Marland Kitchen use warm compresses, wash with warm soapy water and can apply topical antibiotic or cortisone 10 cream.

## 2021-03-02 ENCOUNTER — Ambulatory Visit (INDEPENDENT_AMBULATORY_CARE_PROVIDER_SITE_OTHER): Payer: Medicare Other | Admitting: Podiatry

## 2021-03-02 ENCOUNTER — Other Ambulatory Visit: Payer: Self-pay

## 2021-03-02 ENCOUNTER — Encounter: Payer: Self-pay | Admitting: Podiatry

## 2021-03-02 DIAGNOSIS — M25572 Pain in left ankle and joints of left foot: Secondary | ICD-10-CM

## 2021-03-02 DIAGNOSIS — M6240 Contracture of muscle, unspecified site: Secondary | ICD-10-CM | POA: Diagnosis not present

## 2021-03-02 DIAGNOSIS — M19079 Primary osteoarthritis, unspecified ankle and foot: Secondary | ICD-10-CM | POA: Diagnosis not present

## 2021-03-02 DIAGNOSIS — L97511 Non-pressure chronic ulcer of other part of right foot limited to breakdown of skin: Secondary | ICD-10-CM | POA: Diagnosis not present

## 2021-03-02 MED ORDER — MUPIROCIN CALCIUM 2 % EX CREA: 1.0000 "application " | TOPICAL_CREAM | Freq: Two times a day (BID) | CUTANEOUS | 2 refills | Status: DC

## 2021-03-02 MED ORDER — NITROGLYCERIN 0.2 MG/HR TD PT24: 0.2000 mg | MEDICATED_PATCH | Freq: Every day | TRANSDERMAL | 12 refills | Status: DC

## 2021-03-02 NOTE — Progress Notes (Signed)
She presents today states that the toe is still bothering her that Dr. Rolley Sims injected last time as she refers to the third digit on the right foot.  Objective: Vital signs are stable alert oriented x3 lateral aspect of the third toe of the right foot demonstrates a superficial ulceration overlying the PIPJ.  It appears to not probe deep but it is definitely open there is no erythema cellulitis drainage or odor associated with it.  She does have good strong palpable pulses but capillary fill time is sluggish most likely Raynaud's.  Assessment: Raynaud's with ulcerative lesion lateral aspect third digit right foot.  Plan: Number write her a prescription for new nitro patches because she is using outdated ones and I am also going to write her a prescription for a new Bactroban ointment for the same reason.  I would like her to soak Epsom salts and warm water after showering and making sure that this is dried completely before adding any medication to it.  I like to follow-up with her in just a few weeks and we are going to see about getting her new pair of orthotics.

## 2021-03-04 ENCOUNTER — Telehealth: Payer: Self-pay

## 2021-03-04 NOTE — Telephone Encounter (Signed)
Received request for duplicate orthotics from 2019  Patient will need to come in for signature of ABN and Financial form.

## 2021-03-05 ENCOUNTER — Telehealth: Payer: Self-pay | Admitting: Podiatry

## 2021-03-05 NOTE — Telephone Encounter (Signed)
Kennyth Lose from [facility] called asking about a RX they received for this patient. Kennyth Lose states they had some questions about the order.Jackie's number is [phone].

## 2021-03-05 NOTE — Telephone Encounter (Signed)
Called [place] at [facility]-she was asking if the patient needed to be splinted because they do not do that there. I recommended they just to the stretches and work with her on what they can do and if she or the patient feels its not beneficial then we would try something different. Kennyth Lose agreed and will get the patient scheduled.

## 2021-03-08 ENCOUNTER — Ambulatory Visit: Payer: Medicare Other | Admitting: Podiatry

## 2021-03-09 ENCOUNTER — Telehealth: Payer: Self-pay | Admitting: *Deleted

## 2021-03-09 NOTE — Telephone Encounter (Signed)
PA submitted and approved via Covermymeds for Xifaxan. Faxed approval to pharmacy.

## 2021-03-16 ENCOUNTER — Other Ambulatory Visit: Payer: Medicare Other

## 2021-03-16 ENCOUNTER — Ambulatory Visit: Payer: Medicare Other | Admitting: Podiatry

## 2021-03-31 ENCOUNTER — Ambulatory Visit (INDEPENDENT_AMBULATORY_CARE_PROVIDER_SITE_OTHER): Payer: Medicare Other | Admitting: Podiatry

## 2021-03-31 ENCOUNTER — Encounter: Payer: Self-pay | Admitting: Podiatry

## 2021-03-31 ENCOUNTER — Other Ambulatory Visit: Payer: Self-pay

## 2021-03-31 DIAGNOSIS — L97511 Non-pressure chronic ulcer of other part of right foot limited to breakdown of skin: Secondary | ICD-10-CM

## 2021-03-31 NOTE — Progress Notes (Signed)
She presents today for follow-up of ulceration on the lateral aspect third digit right foot.  States that is a lot better is no longer painful. ? ?Objective: Vital signs are stable she alert and oriented x3 I debrided the little scab that was present today no longer appears to be deep at all there is no purulence no malodor no signs of infection. ? ?Assessment: Well-healing ulcerative lesion lateral PIPJ third digit right foot. ? ?Plan: Discussed appropriate shoe gear and wound therapy and I will follow-up with her on an as-needed basis. ?

## 2021-04-02 ENCOUNTER — Ambulatory Visit: Payer: Medicare Other

## 2021-04-02 ENCOUNTER — Other Ambulatory Visit: Payer: Self-pay

## 2021-04-02 DIAGNOSIS — L97511 Non-pressure chronic ulcer of other part of right foot limited to breakdown of skin: Secondary | ICD-10-CM

## 2021-04-02 DIAGNOSIS — M19079 Primary osteoarthritis, unspecified ankle and foot: Secondary | ICD-10-CM

## 2021-04-02 DIAGNOSIS — M6240 Contracture of muscle, unspecified site: Secondary | ICD-10-CM

## 2021-04-02 NOTE — Progress Notes (Signed)
SITUATION ?Reason for Consult: Evaluation for Bilateral Custom Foot Orthoses ?Patient / Caregiver Report: Patient is ready for foot orthotics ? ?OBJECTIVE DATA: ?Patient History / Diagnosis:  ?  ICD-10-CM   ?1. 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  ?2. Skin ulcer of toe of right foot, limited to breakdown of skin ([place])  L97.511   ?  ?3. 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  ? ? ?Current or Previous Devices:   Current User ? ?Foot Examination: ?Skin presentation:   Intact ?Ulcers & Callousing:   Historical ?Toe / Foot Deformities:  Bunion, Pes Planus ?Weight Bearing Presentation:  Planus ?Sensation:    Intact ? ?Shoe Size:    10 ? ?ORTHOTIC RECOMMENDATION ?Recommended Device: 1x pair of custom functional foot orthotics ? ?GOALS OF ORTHOSES ?- Reduce Pain ?- Prevent Foot Deformity ?- Prevent Progression of Further Foot Deformity ?- Relieve Pressure ?- Improve the Overall Biomechanical Function of the Foot and Lower Extremity. ? ?ACTIONS PERFORMED ?Potential out of pocket cost was communicated to patient. Patient understood and consent to casting. Patient was casted for Foot Orthoses via crush box. Procedure was explained and patient tolerated procedure well. Casts were shipped to central fabrication. All questions were answered and concerns addressed. ? ?PLAN ?Patient is to be called for fitting when devices are ready.  ? ? ? ?

## 2021-04-23 ENCOUNTER — Encounter: Payer: Self-pay | Admitting: Family Medicine

## 2021-04-23 DIAGNOSIS — G609 Hereditary and idiopathic neuropathy, unspecified: Secondary | ICD-10-CM

## 2021-04-24 ENCOUNTER — Encounter: Payer: Self-pay | Admitting: Emergency Medicine

## 2021-04-24 ENCOUNTER — Ambulatory Visit
Admission: EM | Admit: 2021-04-24 | Discharge: 2021-04-24 | Disposition: A | Discharge status: Home / Self Care | Payer: Medicare Other | Attending: Family Medicine | Admitting: Family Medicine

## 2021-04-24 DIAGNOSIS — K580 Irritable bowel syndrome with diarrhea: Secondary | ICD-10-CM

## 2021-04-24 DIAGNOSIS — R197 Diarrhea, unspecified: Secondary | ICD-10-CM | POA: Diagnosis not present

## 2021-04-24 MED ORDER — AZITHROMYCIN 500 MG PO TABS: 500.0000 mg | ORAL_TABLET | Freq: Every day | ORAL | 0 refills | Status: DC

## 2021-04-24 MED ORDER — DICYCLOMINE HCL 20 MG PO TABS: 20.0000 mg | ORAL_TABLET | Freq: Three times a day (TID) | ORAL | 0 refills | Status: DC

## 2021-04-24 NOTE — ED Triage Notes (Signed)
Pt has a history of IBS and she has had several episodes of diarrhea x 7 days. She states it has a bad odor she is being followed by GI.  ?

## 2021-04-24 NOTE — Discharge Instructions (Addendum)
Contact your GI provider or Primary Care doctor on Monday. I will prescribed a very short-course of Azithromycin and Dicyclomine. ?

## 2021-04-25 ENCOUNTER — Ambulatory Visit: Payer: Medicare Other

## 2021-04-25 ENCOUNTER — Encounter: Payer: Self-pay | Admitting: Family Medicine

## 2021-04-26 ENCOUNTER — Telehealth: Payer: Self-pay | Admitting: Gastroenterology

## 2021-04-26 NOTE — Telephone Encounter (Signed)
Patient sent a my chart message as follows: ? ?"Went to Urgent Care Care yesterday, 04/24/21, with episodes of explosive diarrhea for 7 days. (I have IBS) It has gotten more watery and foul smelling ...have been eating only rice, chicken, egg whites...the nurse prescribes a short term dicyclomine,and said to contact my gastroenterologist.  She gave me a sterile container to capture any specimen that might be analyzed for infection. I have a sample in my refrigerator from yesterday. Hoping I can get an appointment and lab analysis on Monday" ?

## 2021-04-26 NOTE — Telephone Encounter (Signed)
Pt seen at [facility] and was given a stool cup to collect her stool specimen. According to lab, specimen should have been submitted within 24 hours of collection in order to process for determination of virus/bacteria. Therefore, specimen is no longer valuable in determining source of concern. Pt has been scheduled for 1st available urgent appt with Ellouise Newer, PA on 04/30/21 @ 130pm. Advised pt remain well hydrated including fluids containing electrolytes, continue ATBs as Rx'd from Urgent Care, and maintain proper hand hygiene to reduce risk of cross contamination should she have a potential virus. Given she is not having any abd pain, rectal bleeding, fever/chills, routing this message to Dr. Tarri Glenn and Ellouise Newer, PA to determine if either would like pt to have additional testing completed BEFORE appt. Pt is aware to proceed to ED if symptoms should worsen prior to upcoming appt. ?

## 2021-04-27 ENCOUNTER — Encounter: Payer: Self-pay | Admitting: Family Medicine

## 2021-04-27 ENCOUNTER — Ambulatory Visit (INDEPENDENT_AMBULATORY_CARE_PROVIDER_SITE_OTHER): Payer: Medicare Other | Admitting: Family Medicine

## 2021-04-27 VITALS — BP 110/62 | HR 70 | Temp 97.7°F | Ht 66.0 in | Wt 106.6 lb

## 2021-04-27 DIAGNOSIS — K582 Mixed irritable bowel syndrome: Secondary | ICD-10-CM | POA: Diagnosis not present

## 2021-04-27 DIAGNOSIS — R197 Diarrhea, unspecified: Secondary | ICD-10-CM | POA: Insufficient documentation

## 2021-04-27 NOTE — Assessment & Plan Note (Addendum)
Acute, moderate improvement ? ?She is no longer having diarrhea but is not having any stool altogether given she is not eating.  I encouraged her to slowly advance her diet as tolerated. ?She does not appear dehydrated as she is keeping up with liquids. ? ?It is unclear as to whether this is infectious versus an IBS flare.  I will have her return stool sample for GI pathogen panel and C.Difficile. Of note, there is no fever and no abdominal pain.  She will consider trying the Bentyl that was prescribed at the urgent care as an antispasmodic. (She was hesitant to start Bentyl as she feels she might of been prescribed this medicine in the past by her ophthalmologist and had a side effect) ? ?She feels that in the past when she was feeling poorly the only thing that helped was rifaximin for SIBO.  I will contact jennifer  Lemmon. PA that we will be seeing her in 3 days to ask if this would be a possibility to start prior to that appointment. ?

## 2021-04-27 NOTE — Patient Instructions (Addendum)
Return stool sample for testing ASAP. Push fluids and slowly advance diet  as tolerated. ? I will forward my note to GI for any recommendations... keep appt on Friday as planned. ? Can try bentyl  as an antispasmodic to help with possible IBS flare. ?

## 2021-04-27 NOTE — Progress Notes (Signed)
? ? Patient ID: Jessica Miles, female    DOB: Mar 24, 1945, 76 y.o.   MRN: [id number] ? ?This visit was conducted in person. ? ?BP 110/62   Pulse 70   Temp 97.7 ?F (36.5 ?C) (Oral)   Ht '5\' 6"'$  (1.676 m)   Wt 106 lb 9 oz (48.3 kg)   SpO2 99%   BMI 17.20 kg/m?   ? ?CC:  ?Chief Complaint  ?Patient presents with  ? Follow-up  ?  Urgent Care 04/24/21-Diarrhea  ? ? ? ? ?Subjective:  ? ?HPI: ?Jessica Miles is a 76 y.o. female with history of IBS presenting on 04/27/2021 for Follow-up (Urgent Care 04/24/21-Diarrhea) ?  ?She felt that in last 6-7 week ago using IBS apps/ strict diet had helped her overall IBS  and she was doing very well. ? ? 3 weeks ago started having bloating and gas. Had reintroduced some new foods ( kiwi and spinach) Stool started becoming softer. ? 7 days ago she started having explosive diarrhea, foul smelling.. 2 times daily. ? Treated with  immodium, did not help. ? No fever. ? Had some low back pain. ? No abdominal pain , no blood in stool. ? ?She was seen at urgent care on 4/15/202. Note reviewed in detail. ?Treated with azithromycin and bentyl prescription... she did not take  either one. ? ? Diarrhea has stopped given she is not eating. ? Still foul smelling gas.  ? She is drinking water and chicken broth.  ? ?Wt Readings from Last 3 Encounters:  ?04/27/21 106 lb 9 oz (48.3 kg)  ?02/11/21 109 lb 8 oz (49.7 kg)  ?12/23/20 109 lb 6.4 oz (49.6 kg)  ? ? Cannot get into Dr. Modena Nunnery until PA appt in 3 days. ? Was  treated  with rifaximin for possible SIBO. ? ?   Good urine output. ? ?Relevant past medical, surgical, family and social history reviewed and updated as indicated. Interim medical history since our last visit reviewed. ?Allergies and medications reviewed and updated. ?Outpatient Medications Prior to Visit  ?Medication Sig Dispense Refill  ? ascorbic acid (VITAMIN C) 500 MG tablet Take 500 mg by mouth 2 (two) times daily.     ? atenolol (TENORMIN) 25 MG tablet Take 25 mg by mouth daily as needed.     ? azithromycin (ZITHROMAX) 500 MG tablet Take 1 tablet (500 mg total) by mouth daily. (Patient not taking: Reported on 04/27/2021) 3 tablet 0  ? buPROPion ER (WELLBUTRIN SR) 100 MG 12 hr tablet TAKE 1 TABLET TWICE A DAY 180 tablet 1  ? Calcium Carb-Cholecalciferol (CALCIUM-VITAMIN D) 600-400 MG-UNIT TABS Take 1 tablet by mouth daily.    ? dicyclomine (BENTYL) 20 MG tablet Take 1 tablet (20 mg total) by mouth 3 (three) times daily before meals. 12 tablet 0  ? DTx App - Gastrointestinal (REGULORA) MISC 1 application by Does not apply route daily. Pt cell [phone] 1 each 0  ? hydrocortisone-pramoxine (ANALPRAM-HC) 2.5-1 % rectal cream Place 1 application rectally as needed for hemorrhoids or anal itching. 30 g 1  ? ketoconazole (NIZORAL) 2 % cream Apply between the third and fourth toe twice a day 60 g 2  ? levothyroxine (SYNTHROID) 100 MCG tablet Take 1 tablet (100 mcg total) by mouth daily. Except for 0.5 tablet on Sunday.  6.5 tablets total per week. 90 tablet 3  ? Lifitegrast (XIIDRA) 5 % SOLN     ? MAGNESIUM PO Take 1 tablet by mouth daily.    ? MELATONIN PO  Take 1 tablet by mouth daily.    ? MULTIPLE VITAMIN PO Take 1 tablet by mouth daily.    ? Multiple Vitamins-Minerals (ZINC PO) Take 1 tablet by mouth daily.    ? mupirocin cream (BACTROBAN) 2 % Apply 1 application topically 2 (two) times daily. 15 g 2  ? nitroGLYCERIN (NITRO-DUR) 0.2 mg/hr patch Place 1 patch (0.2 mg total) onto the skin daily. 30 patch 12  ? NON FORMULARY VSL live probiotic as needed    ? OVER THE COUNTER MEDICATION CBD nightly    ? thiamine (VITAMIN B-1) 100 MG tablet Take 100 mg by mouth daily.    ? TYRVAYA 0.03 MG/ACT SOLN Place 1 spray into both nostrils 2 (two) times daily.    ? ?No facility-administered medications prior to visit.  ?  ? ?Per HPI unless specifically indicated in ROS section below ?Review of Systems  ?Constitutional:  Negative for fatigue and fever.  ?HENT:  Negative for congestion.   ?Eyes:  Negative for pain.   ?Respiratory:  Negative for cough and shortness of breath.   ?Cardiovascular:  Negative for chest pain, palpitations and leg swelling.  ?Gastrointestinal:  Positive for diarrhea. Negative for abdominal distention, abdominal pain, anal bleeding, blood in stool, constipation, nausea, rectal pain and vomiting.  ?Genitourinary:  Negative for dysuria and vaginal bleeding.  ?Musculoskeletal:  Negative for back pain.  ?Neurological:  Negative for syncope, light-headedness and headaches.  ?Psychiatric/Behavioral:  Negative for dysphoric mood.   ?Objective:  ?BP 110/62   Pulse 70   Temp 97.7 ?F (36.5 ?C) (Oral)   Ht '5\' 6"'$  (1.676 m)   Wt 106 lb 9 oz (48.3 kg)   SpO2 99%   BMI 17.20 kg/m?   ?Wt Readings from Last 3 Encounters:  ?04/27/21 106 lb 9 oz (48.3 kg)  ?02/11/21 109 lb 8 oz (49.7 kg)  ?12/23/20 109 lb 6.4 oz (49.6 kg)  ?  ?  ?Physical Exam ?Constitutional:   ?   General: She is not in acute distress. ?   Appearance: Normal appearance. She is well-developed. She is not ill-appearing or toxic-appearing.  ?HENT:  ?   Head: Normocephalic.  ?   Right Ear: Hearing, tympanic membrane, ear canal and external ear normal. Tympanic membrane is not erythematous, retracted or bulging.  ?   Left Ear: Hearing, tympanic membrane, ear canal and external ear normal. Tympanic membrane is not erythematous, retracted or bulging.  ?   Nose: No mucosal edema or rhinorrhea.  ?   Right Sinus: No maxillary sinus tenderness or frontal sinus tenderness.  ?   Left Sinus: No maxillary sinus tenderness or frontal sinus tenderness.  ?   Mouth/Throat:  ?   Pharynx: Uvula midline.  ?Eyes:  ?   General: Lids are normal. Lids are everted, no foreign bodies appreciated.  ?   Conjunctiva/sclera: Conjunctivae normal.  ?   Pupils: Pupils are equal, round, and reactive to light.  ?Neck:  ?   Thyroid: No thyroid mass or thyromegaly.  ?   Vascular: No carotid bruit.  ?   Trachea: Trachea normal.  ?Cardiovascular:  ?   Rate and Rhythm: Normal rate and  regular rhythm.  ?   Pulses: Normal pulses.  ?   Heart sounds: Normal heart sounds, S1 normal and S2 normal. No murmur heard. ?  No friction rub. No gallop.  ?Pulmonary:  ?   Effort: Pulmonary effort is normal. No tachypnea or respiratory distress.  ?   Breath sounds: Normal breath sounds.  No decreased breath sounds, wheezing, rhonchi or rales.  ?Abdominal:  ?   General: Bowel sounds are increased.  ?   Palpations: Abdomen is soft.  ?   Tenderness: There is no abdominal tenderness. There is no right CVA tenderness, left CVA tenderness or guarding.  ?Musculoskeletal:  ?   Cervical back: Normal range of motion and neck supple.  ?Skin: ?   General: Skin is warm and dry.  ?   Findings: No rash.  ?Neurological:  ?   Mental Status: She is alert.  ?Psychiatric:     ?   Mood and Affect: Mood is not anxious or depressed.     ?   Speech: Speech normal.     ?   Behavior: Behavior normal. Behavior is cooperative.     ?   Thought Content: Thought content normal.     ?   Judgment: Judgment normal.  ? ?   ?Results for orders placed or performed in visit on 10/20/20  ?T3, free  ?Result Value Ref Range  ? T3, Free 2.7 2.3 - 4.2 pg/mL  ?T4, free  ?Result Value Ref Range  ? Free T4 0.96 0.60 - 1.60 ng/dL  ?TSH  ?Result Value Ref Range  ? TSH 0.55 0.35 - 5.50 uIU/mL  ?Lipid panel  ?Result Value Ref Range  ? Cholesterol 183 0 - 200 mg/dL  ? Triglycerides 85.0 0.0 - 149.0 mg/dL  ? HDL 83.40 >39.00 mg/dL  ? VLDL 17.0 0.0 - 40.0 mg/dL  ? LDL Cholesterol 83 0 - 99 mg/dL  ? Total CHOL/HDL Ratio 2   ? NonHDL 99.81   ? ? ?This visit occurred during the SARS-CoV-2 public health emergency.  Safety protocols were in place, including screening questions prior to the visit, additional usage of staff PPE, and extensive cleaning of exam room while observing appropriate contact time as indicated for disinfecting solutions.  ? ?COVID 19 screen:  No recent travel or known exposure to [place] ?The patient denies respiratory symptoms of COVID 19 at this  time. ?The importance of social distancing was discussed today.  ? ?Assessment and Plan ?Problem List Items Addressed This Visit   ? ? Acute diarrhea - Primary  ?  Acute, moderate improvement ? ?She is no longer having di

## 2021-04-28 ENCOUNTER — Telehealth: Payer: Self-pay

## 2021-04-28 LAB — C. DIFFICILE GDH AND TOXIN A/B
GDH ANTIGEN: NOT DETECTED
MICRO NUMBER:: 13278721
SPECIMEN QUALITY:: ADEQUATE
TOXIN A AND B: NOT DETECTED

## 2021-04-28 NOTE — Progress Notes (Signed)
Following response received from Dr. Tarri Glenn: ? ?Thornton Park, MD  Levin Erp, [place]; Jinny Sanders, MD; Aleatha Borer, LPN ?I would recommend using PeptoBismal or Imodium to control symptoms while waiting for the GI pathogen panel to result. If negative, could retreat with Xifaxan 550 mg TID x 14 days.  ? ?KLB  ? ?Called pt to inform about Dr. Tarri Glenn recommendations. Phone rang repeatedly without any means to LVM.  ?

## 2021-04-28 NOTE — Telephone Encounter (Signed)
Following message received today from Dr. Tarri Glenn: ? ?Thornton Park, MD  Levin Erp, [place]; Jinny Sanders, MD; Aleatha Borer, LPN ? ?I would recommend using PeptoBismal or Imodium to control symptoms while waiting for the GI pathogen panel to result. If negative, could retreat with Xifaxan 550 mg TID x 14 days.  ? ?KLB  ? ?Called pt to inform about Dr. Tarri Glenn recommendations. Phone rang repeatedly without any means to LVM.  ?

## 2021-04-29 ENCOUNTER — Encounter: Payer: Self-pay | Admitting: Family Medicine

## 2021-04-29 LAB — GASTROINTESTINAL PATHOGEN PNL

## 2021-04-29 NOTE — Telephone Encounter (Signed)
Called pt and informed her of Dr. Tarri Glenn response. Pt states results are available and should be able to move forward with Rx for Xifaxan. Advised the GI pathogen panel has not yet resulted and this is the test Dr. Tarri Glenn is awaiting results of prior to treating with Xifaxan. Results of GI path panel can take a few days. States she plans to discuss this concern with Ellouise Newer, PA during her OV. Routing this message to care team for continuity of care purposes. ?

## 2021-04-30 ENCOUNTER — Ambulatory Visit (INDEPENDENT_AMBULATORY_CARE_PROVIDER_SITE_OTHER): Payer: Medicare Other | Admitting: Physician Assistant

## 2021-04-30 ENCOUNTER — Encounter: Payer: Self-pay | Admitting: Physician Assistant

## 2021-04-30 ENCOUNTER — Other Ambulatory Visit: Payer: Medicare Other

## 2021-04-30 VITALS — BP 116/62 | HR 71 | Ht 66.0 in | Wt 104.1 lb

## 2021-04-30 DIAGNOSIS — R197 Diarrhea, unspecified: Secondary | ICD-10-CM

## 2021-04-30 NOTE — Progress Notes (Signed)
? ?Chief Complaint: Diarrhea ? ?HPI: ?   Jessica Miles is a 76 year old female with a past medical history as listed below including IBS, anxiety, Raynaud's and multiple others, known to Dr. Tarri Glenn, who was referred to me by Jinny Sanders, MD for a complaint of diarrhea. ?   03/10/2020 patient seen in clinic by Dr. Tarri Glenn and at that time they discussed symptomatic diverticulosis +/- SIBO given her presentation of left lower quadrant pain with altered bowel habits different from her chronic constipation IBS symptoms.  That time she is continued on a FODMAP diet and VSL probiotics were discussed.  She was given a trial of Xifaxan 550 mg 3 times daily x2 weeks and told to add Citrucel.  She is also scheduled for an EGD with EUS with Dr. Ardis Hughs or Dr. Rush Landmark given slight prominence of the pancreatic duct. ?   04/26/2021 patient called and described that she went to urgent care on 4/15 with episodes of explosive diarrhea for 7 days.  This is apparently gotten more watery and foul-smelling than her normal IBS.  She had only been eating rice, chicken egg whites and the nurse prescribed short-term Dicyclomine and she was told to call us. ?   04/27/2021 patient saw her PCP and described as 6 to 7 weeks ago she felt that her IBS symptoms were better on a strict diet, but then 3 weeks ago started having bloating and gas after introducing some new foods and a week ago had started with explosive diarrhea, foul-smelling 2 times a day.  She treated with Imodium but did not help.  Described being seen in the urgent care 04/24/2021 with a prescription for Azithromycin and Bentyl.  She apparently did not take either 1.  The diarrhea had stopped given that she is not eating.  Now it is producing foul-smelling gas.  At that time stool studies were ordered including a GI pathogen panel and C. difficile.  (Unfortunately GI path panel was unable to be done) C. difficile negative. ?   04/28/2021 patient called again and asked if she could  just be prescribed Xifaxan as this is worked in the past.  That time Dr. Tarri Glenn recommended using Pepto-Bismol or Imodium to control symptoms while waiting for the GI pathogen panel to result.  If negative could retreat with Xifaxan 550 3 times daily x14 days. ?   Today, the patient tells me that she was doing really well with her IBS and following a very strict diet but then about 4 weeks ago she started developing some excess bloating and gas and foul-smelling odor, but was still having solid stools and then she went on vacation and went off of her diet and started with urgent diarrhea anytime she would eat.  Due to this was seen in the urgent care as above on 4/15 and they prescribed her an antibiotic as well as dicyclomine but she did not take either of these medicines.  She stuck with not eating at all Imodium and she did not have any more bowel movements but "I did not have anything in me".  Most recently she has had to produce some stool samples for PCP and [place] and this morning ate for the first time in about 2 or 3 days and produce some pieces of solid/mushy stool ranked as a 6/7 on the [place] scale she tells me.  She has not had another bowel movement since eating this morning.  She did bring her sample back to the lab today for  repeat GI pathogen panel. ?   Does admit to being an anxious person. ?   Denies fever, chills, abdominal pain or blood in her stool. ? ?Past Medical History:  ?Diagnosis Date  ? Allergy   ? Anxiety   ? COVID-19 virus infection   ? 09-23-2019- 9-16 had monoclonal infusion   ? Depression   ? History of IBS   ? HPV (human papilloma virus) infection   ? Hyperlipidemia   ? 82- not taking statin   ? Loss of taste   ? Neuropathy 2015  ? surgery for Mortons neuroma in 1985  ? Osteopenia   ? Raynaud's disease 02/2020  ? Tachycardia   ? Thyroid disease   ? ? ?Past Surgical History:  ?Procedure Laterality Date  ? COLONOSCOPY  2019  ? hems only   ? [place]  ? WISDOM TOOTH  EXTRACTION    ? ? ?Current Outpatient Medications  ?Medication Sig Dispense Refill  ? ascorbic acid (VITAMIN C) 500 MG tablet Take 500 mg by mouth 2 (two) times daily.     ? atenolol (TENORMIN) 25 MG tablet Take 25 mg by mouth daily as needed.    ? azithromycin (ZITHROMAX) 500 MG tablet Take 1 tablet (500 mg total) by mouth daily. (Patient not taking: Reported on 04/27/2021) 3 tablet 0  ? buPROPion ER (WELLBUTRIN SR) 100 MG 12 hr tablet TAKE 1 TABLET TWICE A DAY 180 tablet 1  ? Calcium Carb-Cholecalciferol (CALCIUM-VITAMIN D) 600-400 MG-UNIT TABS Take 1 tablet by mouth daily.    ? dicyclomine (BENTYL) 20 MG tablet Take 1 tablet (20 mg total) by mouth 3 (three) times daily before meals. 12 tablet 0  ? DTx App - Gastrointestinal (REGULORA) MISC 1 application by Does not apply route daily. Pt cell [phone] 1 each 0  ? hydrocortisone-pramoxine (ANALPRAM-HC) 2.5-1 % rectal cream Place 1 application rectally as needed for hemorrhoids or anal itching. 30 g 1  ? ketoconazole (NIZORAL) 2 % cream Apply between the third and fourth toe twice a day 60 g 2  ? levothyroxine (SYNTHROID) 100 MCG tablet Take 1 tablet (100 mcg total) by mouth daily. Except for 0.5 tablet on Sunday.  6.5 tablets total per week. 90 tablet 3  ? Lifitegrast (XIIDRA) 5 % SOLN     ? MAGNESIUM PO Take 1 tablet by mouth daily.    ? MELATONIN PO Take 1 tablet by mouth daily.    ? MULTIPLE VITAMIN PO Take 1 tablet by mouth daily.    ? Multiple Vitamins-Minerals (ZINC PO) Take 1 tablet by mouth daily.    ? mupirocin cream (BACTROBAN) 2 % Apply 1 application topically 2 (two) times daily. 15 g 2  ? nitroGLYCERIN (NITRO-DUR) 0.2 mg/hr patch Place 1 patch (0.2 mg total) onto the skin daily. 30 patch 12  ? NON FORMULARY VSL live probiotic as needed    ? OVER THE COUNTER MEDICATION CBD nightly    ? thiamine (VITAMIN B-1) 100 MG tablet Take 100 mg by mouth daily.    ? TYRVAYA 0.03 MG/ACT SOLN Place 1 spray into both nostrils 2 (two) times daily.    ? ?No current  facility-administered medications for this visit.  ? ? ?Allergies as of 04/30/2021 - Review Complete 04/24/2021  ?Allergen Reaction Noted  ? Fluticasone Other (See Comments) 03/13/2015  ? Mometasone Other (See Comments) 03/13/2015  ? Fexofenadine Palpitations 07/22/2013  ? ? ?Family History  ?Problem Relation Age of Onset  ? Stroke Mother 51  ?  Heart failure Mother   ? Heart attack Mother   ? Heart disease Mother   ? Heart attack Father 56  ? Heart disease Father   ? Heart disease Brother   ? Heart failure Brother   ?     CHF  ? Heart disease Maternal Grandmother   ? Heart attack Maternal Grandfather   ? Diabetes Son   ?     IDDM  ? Stroke Paternal Uncle   ? Thyroid disease Neg Hx   ? Stomach cancer Neg Hx   ? Esophageal cancer Neg Hx   ? Colon polyps Neg Hx   ? Colon cancer Neg Hx   ? Rectal cancer Neg Hx   ? ? ?Social History  ? ?Socioeconomic History  ? Marital status: Married  ?  Spouse name: Mercie Eon. Simer  ? Number of children: 2  ? Years of education: MA  ? Highest education level: Not on file  ?Occupational History  ? 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 Smoking status: Never  ? Smokeless tobacco: Never  ?Vaping Use  ? Vaping Use: Never used  ?Substance and Sexual Activity  ? Alcohol use: Not Currently  ? Drug use: No  ? Sexual activity: Not Currently  ?Other Topics Concern  ? Not on file  ?Social History Narrative  ? 12/23/20 Patient lives at [facility] with spouse.  ? Caffeine Use: 1 cup daily  ? 2 kids- 4 grandchildren- two boys and two girls  ?   ? ?Social Determinants of Health  ? ?Financial Resource Strain: Not on file  ?Food Insecurity: Not on file  ?Transportation Needs: Not on file  ?Physical Activity: Not on file  ?Stress: Not on file  ?Social Connections: Not on file  ?Intimate Partner Violence: Not on file  ? ? ?Review of Systems:    ?Constitutional: No weight loss, fever or chills ?Cardiovascular: No chest pain  ?Respiratory: No SOB  ?Gastrointestinal: See HPI and  otherwise negative ? ? Physical Exam:  ?Vital signs: ?BP 116/62   Pulse 71   Ht '5\' 6"'$  (1.676 m)   Wt 104 lb 2 oz (47.2 kg)   BMI 16.81 kg/m?   ? ?Constitutional:   Pleasant Caucasian female appears to be in

## 2021-04-30 NOTE — Patient Instructions (Signed)
Mrs. Barth, ? ?It was a pleasure to see you in clinic today.  As we discussed we are awaiting results from GI pathogen panel.  If this is negative we will go ahead and retreat with Xifaxan, if it is positive we will need to pick a different antibiotic. ? ?As we discussed you can trial the Dicyclomine 20 mg as needed for symptoms and/or continue Imodium as needed. ? ?Sincerely, ?Ellouise Newer, PA-C ?

## 2021-04-30 NOTE — Telephone Encounter (Signed)
Jessica Miles stopped by lab this morning and picked up containers.  ?

## 2021-04-30 NOTE — Progress Notes (Signed)
I

## 2021-05-02 LAB — GASTROINTESTINAL PATHOGEN PNL
CampyloBacter Group: NOT DETECTED
Norovirus GI/GII: NOT DETECTED
Rotavirus A: NOT DETECTED
Salmonella species: NOT DETECTED
Shiga Toxin 1: NOT DETECTED
Shiga Toxin 2: NOT DETECTED
Shigella Species: NOT DETECTED
Vibrio Group: NOT DETECTED
Yersinia enterocolitica: NOT DETECTED

## 2021-05-04 MED ORDER — RIFAXIMIN 550 MG PO TABS: 550.0000 mg | ORAL_TABLET | Freq: Three times a day (TID) | ORAL | 0 refills | Status: AC

## 2021-05-04 NOTE — Telephone Encounter (Signed)
Pt called in to the office to discuss MyChart message. Pt already has an active PA on file for Xifaxan per CMM "An active PA is already on file with expiration date of 03/09/2022. Please wait to resubmit request within 60 days of that expiration date to obtain a PA renewal.". RX for Xifaxan sent to [facility] per pt request. Pt notified via [place].  ?

## 2021-05-13 ENCOUNTER — Ambulatory Visit (INDEPENDENT_AMBULATORY_CARE_PROVIDER_SITE_OTHER): Payer: Self-pay

## 2021-05-13 DIAGNOSIS — L97511 Non-pressure chronic ulcer of other part of right foot limited to breakdown of skin: Secondary | ICD-10-CM

## 2021-05-13 DIAGNOSIS — M6240 Contracture of muscle, unspecified site: Secondary | ICD-10-CM

## 2021-05-13 DIAGNOSIS — M19079 Primary osteoarthritis, unspecified ankle and foot: Secondary | ICD-10-CM

## 2021-05-13 NOTE — Progress Notes (Signed)
SITUATION: ?Reason for Visit: Fitting and Delivery of Custom Fabricated Foot Orthoses ?Patient Report: Patient reports comfort and is satisfied with device. ? ?OBJECTIVE DATA: ?Patient History / Diagnosis:   ?  ICD-10-CM   ?1. 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  ?2. Skin ulcer of toe of right foot, limited to breakdown of skin ([place])  L97.511   ?  ?3. 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  ? ? ?Provided Device:  Custom Functional Foot Orthotics ?    [facility]: [id number] ? ?GOAL OF ORTHOSIS ?- Improve gait ?- Decrease energy expenditure ?- Improve Balance ?- Provide Triplanar stability of foot complex ?- Facilitate motion ? ?ACTIONS PERFORMED ?Patient was fit with foot orthotics trimmed to shoe last. Patient tolerated fittign procedure.  ? ?Patient was provided with verbal and written instruction and demonstration regarding donning, doffing, wear, care, proper fit, function, purpose, cleaning, and use of the orthosis and in all related precautions and risks and benefits regarding the orthosis. ? ?Patient was also provided with verbal instruction regarding how to report any failures or malfunctions of the orthosis and necessary follow up care. Patient was also instructed to contact our office regarding any change in status that may affect the function of the orthosis. ? ?Patient demonstrated independence with proper donning, doffing, and fit and verbalized understanding of all instructions. ? ?PLAN: ?Patient is to follow up in one week or as necessary (PRN). All questions were answered and concerns addressed. Plan of care was discussed with and agreed upon by the patient. ? ?

## 2021-09-01 ENCOUNTER — Ambulatory Visit: Payer: Medicare Other | Admitting: Podiatry

## 2021-09-02 ENCOUNTER — Telehealth: Payer: Self-pay | Admitting: *Deleted

## 2021-09-02 NOTE — Patient Outreach (Signed)
  Care Coordination   09/02/2021 Name: Jessica Miles MRN: [id number] DOB: [birth year]   Care Coordination Outreach Attempts:  An unsuccessful telephone outreach was attempted today to offer the patient information about available care coordination services as a benefit of their health plan.   Follow Up Plan:  Additional outreach attempts will be made to offer the patient care coordination information and services.   Encounter Outcome:  No Answer  Care Coordination Interventions Activate y  Care Coordination Interventions:  No, not indicated    [facility] [phone]

## 2021-09-07 ENCOUNTER — Ambulatory Visit (INDEPENDENT_AMBULATORY_CARE_PROVIDER_SITE_OTHER): Payer: Medicare Other | Admitting: Family Medicine

## 2021-09-07 ENCOUNTER — Encounter: Payer: Self-pay | Admitting: Family Medicine

## 2021-09-07 VITALS — BP 94/60 | HR 67 | Temp 98.2°F | Ht 66.0 in | Wt 106.1 lb

## 2021-09-07 DIAGNOSIS — R5383 Other fatigue: Secondary | ICD-10-CM

## 2021-09-07 DIAGNOSIS — R636 Underweight: Secondary | ICD-10-CM | POA: Diagnosis not present

## 2021-09-07 DIAGNOSIS — E559 Vitamin D deficiency, unspecified: Secondary | ICD-10-CM | POA: Diagnosis not present

## 2021-09-07 DIAGNOSIS — Z130 Encounter for screening for diseases of the blood and blood-forming organs and certain disorders involving the immune mechanism: Secondary | ICD-10-CM | POA: Diagnosis not present

## 2021-09-07 LAB — CBC WITH DIFFERENTIAL/PLATELET
Basophils Absolute: 0.1 10*3/uL (ref 0.0–0.1)
Basophils Relative: 1 % (ref 0.0–3.0)
Eosinophils Absolute: 0.1 10*3/uL (ref 0.0–0.7)
Eosinophils Relative: 1.4 % (ref 0.0–5.0)
HCT: 39.5 % (ref 36.0–46.0)
Hemoglobin: 13 g/dL (ref 12.0–15.0)
Lymphocytes Relative: 30.4 % (ref 12.0–46.0)
Lymphs Abs: 1.6 10*3/uL (ref 0.7–4.0)
MCHC: 33 g/dL (ref 30.0–36.0)
MCV: 95.1 fl (ref 78.0–100.0)
Monocytes Absolute: 0.3 10*3/uL (ref 0.1–1.0)
Monocytes Relative: 6 % (ref 3.0–12.0)
Neutro Abs: 3.1 10*3/uL (ref 1.4–7.7)
Neutrophils Relative %: 61.2 % (ref 43.0–77.0)
Platelets: 209 10*3/uL (ref 150.0–400.0)
RBC: 4.15 Mil/uL (ref 3.87–5.11)
RDW: 12.6 % (ref 11.5–15.5)
WBC: 5.1 10*3/uL (ref 4.0–10.5)

## 2021-09-07 LAB — COMPREHENSIVE METABOLIC PANEL
ALT: 38 U/L — ABNORMAL HIGH (ref 0–35)
AST: 39 U/L — ABNORMAL HIGH (ref 0–37)
Albumin: 4.6 g/dL (ref 3.5–5.2)
Alkaline Phosphatase: 107 U/L (ref 39–117)
BUN: 31 mg/dL — ABNORMAL HIGH (ref 6–23)
CO2: 27 mEq/L (ref 19–32)
Calcium: 10.2 mg/dL (ref 8.4–10.5)
Chloride: 101 mEq/L (ref 96–112)
Creatinine, Ser: 1.01 mg/dL (ref 0.40–1.20)
GFR: 53.66 mL/min — ABNORMAL LOW (ref >60.00)
Glucose, Bld: 80 mg/dL (ref 70–99)
Potassium: 4.2 mEq/L (ref 3.5–5.1)
Sodium: 141 mEq/L (ref 135–145)
Total Bilirubin: 0.5 mg/dL (ref 0.2–1.2)
Total Protein: 7.2 g/dL (ref 6.0–8.3)

## 2021-09-07 LAB — IBC + FERRITIN
Ferritin: 83.4 ng/mL (ref 10.0–291.0)
Iron: 82 ug/dL (ref 42–145)
Saturation Ratios: 22.6 % (ref 20.0–50.0)
TIBC: 362.6 ug/dL (ref 250.0–450.0)
Transferrin: 259 mg/dL (ref 212.0–360.0)

## 2021-09-07 LAB — VITAMIN B12: Vitamin B-12: >1500 pg/mL — ABNORMAL HIGH (ref 211–911)

## 2021-09-07 LAB — TSH: TSH: 3.72 u[IU]/mL (ref 0.35–5.50)

## 2021-09-07 LAB — VITAMIN D 25 HYDROXY (VIT D DEFICIENCY, FRACTURES): VITD: 89.19 ng/mL (ref 30.00–100.00)

## 2021-09-07 NOTE — Assessment & Plan Note (Signed)
We reviewed her dietary intake.  She is very likely deficient in iron or vitamins.  Will review evaluate with labs today.  I encouraged her to see a nutritionist for discussion of diet that will help IBS as well as SIBO.  She will consider specialty GI nutrition as she is looking at online.  It is a 18-monthprogram.  She is not interested in a local referral to nutritionist.

## 2021-09-07 NOTE — Patient Instructions (Signed)
Please stop at the lab to have labs drawn.  

## 2021-09-07 NOTE — Progress Notes (Signed)
Patient ID: Jessica Miles, female    DOB: 01/03/1946, 76 y.o.   MRN: [id number]  This visit was conducted in person.  BP 94/60   Pulse 67   Temp 98.2 F (36.8 C) (Oral)   Ht '5\' 6"'$  (1.676 m)   Wt 106 lb 2 oz (48.1 kg)   SpO2 98%   BMI 17.13 kg/m    CC:  Chief Complaint  Patient presents with   Leg and Hand Cramps   Abdominal Pain   Wave of Cold that comes acorss back and abdomen   Fatigue         Subjective:   HPI: Jessica Miles is a 76 y.o. female presenting on 09/07/2021 for Leg and Hand Cramps, Abdominal Pain, Wave of Cold that comes acorss back and abdomen, and Fatigue (/)   She presents with multiple ongoing issues.   She has very restricted diet with SIBO and IBS... she is eating rice cakes, blueberries, almond butter, egg whites.  She feels she may be more deficient in vitamins, electrolytes  Feeling weak and tired in last week weeks, cramps in  legs, increase in gas, feeling cold intolerance.  Wt Readings from Last 3 Encounters:  09/07/21 106 lb 2 oz (48.1 kg)  04/30/21 104 lb 2 oz (47.2 kg)  04/27/21 106 lb 9 oz (48.3 kg)   Body mass index is 17.13 kg/m.  She is  looking into specialist  GI nutrition  evaluation on  [facility].      Relevant past medical, surgical, family and social history reviewed and updated as indicated. Interim medical history since our last visit reviewed. Allergies and medications reviewed and updated. Outpatient Medications Prior to Visit  Medication Sig Dispense Refill   ascorbic acid (VITAMIN C) 500 MG tablet Take 500 mg by mouth 2 (two) times daily.      atenolol (TENORMIN) 25 MG tablet Take 25 mg by mouth daily as needed.     buPROPion ER (WELLBUTRIN SR) 100 MG 12 hr tablet TAKE 1 TABLET TWICE A DAY 180 tablet 1   DTx App - Gastrointestinal (REGULORA) MISC 1 application by Does not apply route daily. Pt cell [phone] 1 each 0   hydrocortisone-pramoxine (ANALPRAM-HC) 2.5-1 % rectal cream Place 1 application  rectally as needed for hemorrhoids or anal itching. 30 g 1   ketoconazole (NIZORAL) 2 % cream Apply between the third and fourth toe twice a day 60 g 2   levothyroxine (SYNTHROID) 100 MCG tablet Take 1 tablet (100 mcg total) by mouth daily. Except for 0.5 tablet on Sunday.  6.5 tablets total per week. 90 tablet 3   Lifitegrast (XIIDRA) 5 % SOLN      MAGNESIUM PO Take 1 tablet by mouth daily.     MELATONIN PO Take 1 tablet by mouth daily.     MULTIPLE VITAMIN PO Take 1 tablet by mouth daily.     Multiple Vitamins-Minerals (ZINC PO) Take 1 tablet by mouth daily.     mupirocin cream (BACTROBAN) 2 % Apply 1 application topically 2 (two) times daily. 15 g 2   nitroGLYCERIN (NITRO-DUR) 0.2 mg/hr patch Place 1 patch (0.2 mg total) onto the skin daily. 30 patch 12   NON FORMULARY VSL live probiotic-one tablet by mouth daily     OVER THE COUNTER MEDICATION CBD nightly     thiamine (VITAMIN B-1) 100 MG tablet Take 100 mg by mouth daily.     Calcium Carb-Cholecalciferol (CALCIUM-VITAMIN D) 600-400 MG-UNIT TABS Take  1 tablet by mouth daily.     TYRVAYA 0.03 MG/ACT SOLN Place 1 spray into both nostrils 2 (two) times daily. (Patient not taking: Reported on 09/07/2021)     dicyclomine (BENTYL) 20 MG tablet Take 1 tablet (20 mg total) by mouth 3 (three) times daily before meals. (Patient not taking: Reported on 04/30/2021) 12 tablet 0   No facility-administered medications prior to visit.     Per HPI unless specifically indicated in ROS section below Review of Systems  Constitutional:  Positive for fatigue. Negative for fever.  HENT:  Negative for ear pain.   Eyes:  Negative for pain.  Respiratory:  Negative for chest tightness and shortness of breath.   Cardiovascular:  Negative for chest pain, palpitations and leg swelling.  Gastrointestinal:  Negative for abdominal pain.  Genitourinary:  Negative for dysuria.   Objective:  BP 94/60   Pulse 67   Temp 98.2 F (36.8 C) (Oral)   Ht '5\' 6"'$  (1.676 m)    Wt 106 lb 2 oz (48.1 kg)   SpO2 98%   BMI 17.13 kg/m   Wt Readings from Last 3 Encounters:  09/07/21 106 lb 2 oz (48.1 kg)  04/30/21 104 lb 2 oz (47.2 kg)  04/27/21 106 lb 9 oz (48.3 kg)      Physical Exam Constitutional:      General: She is not in acute distress.    Appearance: Normal appearance. She is well-developed and underweight. She is not ill-appearing or toxic-appearing.  HENT:     Head: Normocephalic.     Right Ear: Hearing, tympanic membrane, ear canal and external ear normal. Tympanic membrane is not erythematous, retracted or bulging.     Left Ear: Hearing, tympanic membrane, ear canal and external ear normal. Tympanic membrane is not erythematous, retracted or bulging.     Nose: No mucosal edema or rhinorrhea.     Right Sinus: No maxillary sinus tenderness or frontal sinus tenderness.     Left Sinus: No maxillary sinus tenderness or frontal sinus tenderness.     Mouth/Throat:     Pharynx: Uvula midline.  Eyes:     General: Lids are normal. Lids are everted, no foreign bodies appreciated.     Conjunctiva/sclera: Conjunctivae normal.     Pupils: Pupils are equal, round, and reactive to light.  Neck:     Thyroid: No thyroid mass or thyromegaly.     Vascular: No carotid bruit.     Trachea: Trachea normal.  Cardiovascular:     Rate and Rhythm: Normal rate and regular rhythm.     Pulses: Normal pulses.     Heart sounds: Normal heart sounds, S1 normal and S2 normal. No murmur heard.    No friction rub. No gallop.  Pulmonary:     Effort: Pulmonary effort is normal. No tachypnea or respiratory distress.     Breath sounds: Normal breath sounds. No decreased breath sounds, wheezing, rhonchi or rales.  Abdominal:     General: Bowel sounds are normal.     Palpations: Abdomen is soft.     Tenderness: There is no abdominal tenderness.  Musculoskeletal:     Cervical back: Normal range of motion and neck supple.  Skin:    General: Skin is warm and dry.     Findings: No  rash.  Neurological:     Mental Status: She is alert.  Psychiatric:        Mood and Affect: Mood is not anxious or depressed.  Speech: Speech normal.        Behavior: Behavior normal. Behavior is cooperative.        Thought Content: Thought content normal.        Judgment: Judgment normal.       Results for orders placed or performed in visit on 04/30/21  Gastrointestinal Pathogen Pnl RT, PCR  Result Value Ref Range   CampyloBacter Group NOT DETECTED NOT DETECTED   Salmonella species NOT DETECTED NOT DETECTED   Shigella Species NOT DETECTED NOT DETECTED   Vibrio Group NOT DETECTED NOT DETECTED   Yersinia enterocolitica NOT DETECTED NOT DETECTED   Shiga Toxin 1 NOT DETECTED NOT DETECTED   Shiga Toxin 2 NOT DETECTED NOT DETECTED   Norovirus GI/GII NOT DETECTED NOT DETECTED   Rotavirus A NOT DETECTED NOT DETECTED     COVID 19 screen:  No recent travel or known exposure to [place] The patient denies respiratory symptoms of COVID 19 at this time. The importance of social distancing was discussed today.   Assessment and Plan Problem List Items Addressed This Visit     Fatigue - Primary    Acute Likely multifactorial.  Will evaluate with labs.      Relevant Orders   CBC with Differential/Platelet   Vitamin B12   TSH   Comprehensive metabolic panel   Magnesium   Severely underweight adult    We reviewed her dietary intake.  She is very likely deficient in iron or vitamins.  Will review evaluate with labs today.  I encouraged her to see a nutritionist for discussion of diet that will help IBS as well as SIBO.  She will consider specialty GI nutrition as she is looking at online.  It is a 16-monthprogram.  She is not interested in a local referral to nutritionist.      Other Visit Diagnoses     Vitamin D deficiency       Relevant Orders   VITAMIN D 25 Hydroxy (Vit-D Deficiency, Fractures)   Screening, iron deficiency anemia       Relevant Orders   IBC + Ferritin        AEliezer Lofts MD

## 2021-09-07 NOTE — Assessment & Plan Note (Signed)
Acute Likely multifactorial.  Will evaluate with labs.

## 2021-09-08 ENCOUNTER — Encounter: Payer: Self-pay | Admitting: Podiatry

## 2021-09-08 ENCOUNTER — Ambulatory Visit (INDEPENDENT_AMBULATORY_CARE_PROVIDER_SITE_OTHER): Payer: Medicare Other | Admitting: Podiatry

## 2021-09-08 DIAGNOSIS — D2371 Other benign neoplasm of skin of right lower limb, including hip: Secondary | ICD-10-CM | POA: Diagnosis not present

## 2021-09-08 DIAGNOSIS — G5792 Unspecified mononeuropathy of left lower limb: Secondary | ICD-10-CM | POA: Diagnosis not present

## 2021-09-08 DIAGNOSIS — L603 Nail dystrophy: Secondary | ICD-10-CM

## 2021-09-08 LAB — MAGNESIUM: Magnesium: 2.3 mg/dL (ref 1.5–2.5)

## 2021-09-08 MED ORDER — TRIAMCINOLONE ACETONIDE 40 MG/ML IJ SUSP
20.0000 mg | Freq: Once | INTRAMUSCULAR | Status: AC
  Administered 2021-09-08: 20 mg

## 2021-09-08 NOTE — Progress Notes (Signed)
Jessica Miles presents today with a plethora of concerns.  Largest concerns seem to be calluses to the second third fourth toes of the right foot.  She is also concerned about a possible ingrown toenail to the fibular border of the right hallux.  She is also concerned about discoloration and thickening of the toenail third digit left foot pain which is radiating burning at the anterior lateral ankle left and the fact that she is cramping.  States that these cramps are nocturnal and her primary care provider has just done blood work panel which she has not heard back from yet.  She is also concerned about splotches in her bilateral lower extremity.  Objective: Vital signs are stable she alert and oriented x3.  Pulses are palpable.  Neurologic sensorium is intact she does have tenderness on palpation of the intermediate dorsal cutaneous nerve at the level of the talus.  Most likely is due to shoe gear being tight pressing against a very prominent area on her talus.  This is tender with palpation.  Left foot also demonstrates some nail color change to the nail plate left however it is too little to sample and there is no surrounding rash associated with it.  She also demonstrates bilateral ankles and legs spider veins and varicosities which she was calling red splotches.  No open lesions or wounds are identified.  Right foot does demonstrate poor keratomas to the toes in question and she does have a mildly incurvated fibular nail border right great toe.  Assessment: Benign skin lesions toes #2 #3 #4 of the right foot.  Ingrown toenail fibular border right foot.  Left foot nail dystrophy third digit.  Neuritis intermediate dorsal cutaneous nerve left and spider veins left.  Plan: Discussed etiology pathology conservative surgical therapies I debrided all benign skin lesions today.  I also injected her left ankle at the level of the nerve which is very visible on her in anatomy.  I injected dexamethasone and local  anesthetic.  I asked her to allow the nail to grow for about 2 months and then we will take sample to send it for pathology.  Follow-up with me in a couple of months.

## 2021-09-09 ENCOUNTER — Other Ambulatory Visit: Payer: Self-pay | Admitting: Family Medicine

## 2021-09-27 ENCOUNTER — Encounter: Payer: Self-pay | Admitting: Family Medicine

## 2021-10-08 ENCOUNTER — Telehealth: Payer: Self-pay | Admitting: Family Medicine

## 2021-10-08 DIAGNOSIS — E039 Hypothyroidism, unspecified: Secondary | ICD-10-CM

## 2021-10-08 DIAGNOSIS — E559 Vitamin D deficiency, unspecified: Secondary | ICD-10-CM

## 2021-10-08 DIAGNOSIS — I700 Atherosclerosis of aorta: Secondary | ICD-10-CM

## 2021-10-08 NOTE — Telephone Encounter (Signed)
-----   Message from Ellamae Sia sent at 09/29/2021  3:48 PM EDT ----- Regarding: Lab orders for Thursday, 10.5.23 Patient is scheduled for CPX labs, please order future labs, Thanks , Karna Christmas

## 2021-10-08 NOTE — Telephone Encounter (Signed)
labs

## 2021-10-13 ENCOUNTER — Ambulatory Visit: Payer: Medicare Other

## 2021-10-14 ENCOUNTER — Other Ambulatory Visit (INDEPENDENT_AMBULATORY_CARE_PROVIDER_SITE_OTHER): Payer: Medicare Other

## 2021-10-14 DIAGNOSIS — I700 Atherosclerosis of aorta: Secondary | ICD-10-CM

## 2021-10-14 LAB — LIPID PANEL
Cholesterol: 170 mg/dL (ref 0–200)
HDL: 77.2 mg/dL (ref >39.00)
LDL Cholesterol: 80 mg/dL (ref 0–99)
NonHDL: 92.51
Total CHOL/HDL Ratio: 2
Triglycerides: 62 mg/dL (ref 0.0–149.0)
VLDL: 12.4 mg/dL (ref 0.0–40.0)

## 2021-10-14 LAB — COMPREHENSIVE METABOLIC PANEL
ALT: 54 U/L — ABNORMAL HIGH (ref 0–35)
AST: 50 U/L — ABNORMAL HIGH (ref 0–37)
Albumin: 4.3 g/dL (ref 3.5–5.2)
Alkaline Phosphatase: 121 U/L — ABNORMAL HIGH (ref 39–117)
BUN: 28 mg/dL — ABNORMAL HIGH (ref 6–23)
CO2: 28 mEq/L (ref 19–32)
Calcium: 9.2 mg/dL (ref 8.4–10.5)
Chloride: 101 mEq/L (ref 96–112)
Creatinine, Ser: 1.11 mg/dL (ref 0.40–1.20)
GFR: 47.88 mL/min — ABNORMAL LOW (ref >60.00)
Glucose, Bld: 69 mg/dL — ABNORMAL LOW (ref 70–99)
Potassium: 4.1 mEq/L (ref 3.5–5.1)
Sodium: 138 mEq/L (ref 135–145)
Total Bilirubin: 0.4 mg/dL (ref 0.2–1.2)
Total Protein: 6.9 g/dL (ref 6.0–8.3)

## 2021-10-14 NOTE — Progress Notes (Signed)
No critical labs need to be addressed urgently. We will discuss labs in detail at upcoming office visit.   

## 2021-10-18 ENCOUNTER — Other Ambulatory Visit: Payer: Self-pay | Admitting: Family Medicine

## 2021-10-18 DIAGNOSIS — E039 Hypothyroidism, unspecified: Secondary | ICD-10-CM

## 2021-10-20 ENCOUNTER — Encounter: Payer: Self-pay | Admitting: Diagnostic Neuroimaging

## 2021-10-20 ENCOUNTER — Ambulatory Visit (INDEPENDENT_AMBULATORY_CARE_PROVIDER_SITE_OTHER): Payer: Medicare Other | Admitting: Diagnostic Neuroimaging

## 2021-10-20 VITALS — BP 113/59 | HR 63 | Ht 66.0 in | Wt 106.5 lb

## 2021-10-20 DIAGNOSIS — R200 Anesthesia of skin: Secondary | ICD-10-CM | POA: Diagnosis not present

## 2021-10-20 NOTE — Progress Notes (Signed)
[facility]  PATIENT: Jessica Miles DOB: 1945-12-20  REFERRING CLINICIAN: Jinny Sanders, MD HISTORY FROM: patient  REASON FOR VISIT: follow up   HISTORICAL  CHIEF COMPLAINT:  Chief Complaint  Patient presents with   Follow-up    Pt is well. She feels like she has a cold solution running through her body that comes to back and abdomen. Room 7 alone    HISTORY OF PRESENT ILLNESS:   UPDATE (10/20/21, VRP): Since last visit, doing about the same. Some intermittent chill sensation in back and abdomen (2-5 minutes; 1-3 x per day; since 2021). Started after COVID infx. No pain. No triggers.  UPDATE 12/23/20: 76 year old female here for evaluation of transient confusion.  07/25/2020 patient was riding her bike on a 20 mile route, when halfway through she felt confused and could not recognize where she was.  This lasted for a few minutes and then resolved.  She had a similar event in 2020 when she was out walking in her neighborhood, could not recognize her surroundings and then it resolved.  No loss of consciousness.  No weakness, facial droop, headaches, vision changes.  UPDATE 11/11/15: Since last visit, in last 3 months, more symptoms are starting. Now with muscle weakness in wrist, ankles, intermittent muscle twitching. More burning sensation in tongue and tingling in face. More burning in feet. Had event where she stood up and could move her legs. Had intermittent right foot/ankle inversion. Intermittent toes cramping.   UPDATE 01/20/14: Since last visit, foot numbness has slightly improved again. Taste loss is stable. Going to see [facility] smell/taste clinic in a few months. IBS symptoms stable.    UPDATE 10/16/13: Since last visit, numbness in feet slightly improved; continues on ALA, benfotamine, B12 supplements. Also has new problem, consistenting of reduced taste perception. Especially diff with sweet and sour. Smell is slightly reduced. She noticed this 3 weeks ago.     PRIOR HPI (08/20/13): 76 year old right-handed female with anxiety, tachycardia, hypothyroidism, IBS, here for evaluation of neuropathy. 5 months ago patient had significant increase in physical activity with increased exercise on a daily basis. End of June 2015 patient also started playing tennis. Within a few weeks patient developed numbness in her toes and feet. She felt a "pad sensation" on the balls of her feet. Patient also was developing some cramps in her calves. Patient went to PCP and podiatry, treated with metatarsal pads, cortisone shots in the feet, started on vitamin supplementation. Over the past few weeks patient symptoms have slightly improved. She still has some numbness on the bottom of her feet as well as some intermittent cramps. Patient is also to try to reduce her physical activity. Patient had blood testing by PCP with normal B12 level, and slightly low TSH. 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 Patient denies any neck or low back pain. No problems with her fingers or hands. No vision changes, slurred speech trouble talking headaches.   REVIEW OF SYSTEMS: Full 14 system review of systems performed and negative with exception of: as per HPI.  ALLERGIES: Allergies  Allergen Reactions   Fluticasone Other (See Comments)    Nose bleeds   Mometasone Other (See Comments)    Epistaxis   Fexofenadine Palpitations    HOME MEDICATIONS: Outpatient Medications Prior to Visit  Medication Sig Dispense Refill   ascorbic acid (VITAMIN C) 500 MG tablet Take 500 mg by mouth 2 (two) times daily.  atenolol (TENORMIN) 25 MG tablet Take 25 mg by mouth daily as needed.     buPROPion ER (WELLBUTRIN SR) 100 MG 12 hr tablet TAKE 1 TABLET TWICE A DAY 180 tablet 1   Calcium Carb-Cholecalciferol (CALCIUM-VITAMIN D) 600-400 MG-UNIT TABS Take 1 tablet by mouth daily.     DTx App - Gastrointestinal (REGULORA) MISC 1 application by Does not  apply route daily. Pt cell [phone] 1 each 0   hydrocortisone-pramoxine (ANALPRAM-HC) 2.5-1 % rectal cream Place 1 application rectally as needed for hemorrhoids or anal itching. 30 g 1   ketoconazole (NIZORAL) 2 % cream Apply between the third and fourth toe twice a day 60 g 2   levothyroxine (SYNTHROID) 100 MCG tablet TAKE ONE TABLET DAILY EXCEPT TAKE ONE-HALF (1/2) TABLET ON SUNDAY. SIX AND ONE-HALF TABLETS TOTAL PER WEEK 90 tablet 3   Lifitegrast (XIIDRA) 5 % SOLN      MAGNESIUM PO Take 1 tablet by mouth daily.     MELATONIN PO Take 1 tablet by mouth daily.     MULTIPLE VITAMIN PO Take 1 tablet by mouth daily.     Multiple Vitamins-Minerals (ZINC PO) Take 1 tablet by mouth daily.     mupirocin cream (BACTROBAN) 2 % Apply 1 application topically 2 (two) times daily. 15 g 2   nitroGLYCERIN (NITRO-DUR) 0.2 mg/hr patch Place 1 patch (0.2 mg total) onto the skin daily. 30 patch 12   NON FORMULARY VSL live probiotic-one tablet by mouth daily     OVER THE COUNTER MEDICATION CBD nightly     thiamine (VITAMIN B-1) 100 MG tablet Take 100 mg by mouth daily.     TYRVAYA 0.03 MG/ACT SOLN Place 1 spray into both nostrils 2 (two) times daily. (Patient not taking: Reported on 09/07/2021)     No facility-administered medications prior to visit.    PAST MEDICAL HISTORY: Past Medical History:  Diagnosis Date   Allergy    Anxiety    COVID-19 virus infection    09-23-2019- 9-16 had monoclonal infusion    Depression    History of IBS    HPV (human papilloma virus) infection    Hyperlipidemia    83- not taking statin    Loss of taste    Neuropathy 2015   surgery for Mortons neuroma in 1985   Osteopenia    Raynaud's disease 02/2020   Tachycardia    Thyroid disease     PAST SURGICAL HISTORY: Past Surgical History:  Procedure Laterality Date   COLONOSCOPY  2019   hems only    FOOT NEUROMA SURGERY  1985   WISDOM TOOTH EXTRACTION      FAMILY HISTORY: Family History  Problem Relation Age  of Onset   Stroke Mother 50   Heart failure Mother    Heart attack Mother    Heart disease Mother    Heart attack Father 46   Heart disease Father    Heart disease Brother    Heart failure Brother        CHF   Heart disease Maternal Grandmother    Heart attack Maternal Grandfather    Diabetes Son        IDDM   Stroke Paternal Uncle    Thyroid disease Neg Hx    Stomach cancer Neg Hx    Esophageal cancer Neg Hx    Colon polyps Neg Hx    Colon cancer Neg Hx    Rectal cancer Neg Hx     SOCIAL HISTORY: Social History  Socioeconomic History   Marital status: Married    Spouse name: Mercie Eon. Shadrick   Number of children: 2   Years of education: MA   Highest education level: Not on file  Occupational History   Occupation: Retired    Comment: Music therapist  Tobacco Use   Smoking status: Never   Smokeless tobacco: Never  Vaping Use   Vaping Use: Never used  Substance and Sexual Activity   Alcohol use: Not Currently   Drug use: No   Sexual activity: Not Currently  Other Topics Concern   Not on file  Social History Narrative   12/23/20 Patient lives at [facility] with spouse.   Caffeine Use: 1 cup daily   2 kids- 4 grandchildren- two boys and two girls      Social Determinants of Health   Financial Resource Strain: Not on file  Food Insecurity: Not on file  Transportation Needs: Not on file  Physical Activity: Not on file  Stress: Not on file  Social Connections: Not on file  Intimate Partner Violence: Not on file     PHYSICAL EXAM  GENERAL EXAM/CONSTITUTIONAL: Vitals:  Vitals:   10/20/21 0840  BP: (!) 113/59  Pulse: 63  Weight: 106 lb 8 oz (48.3 kg)  Height: '5\' 6"'$  (1.676 m)   Body mass index is 17.19 kg/m. Wt Readings from Last 3 Encounters:  10/20/21 106 lb 8 oz (48.3 kg)  09/07/21 106 lb 2 oz (48.1 kg)  04/30/21 104 lb 2 oz (47.2 kg)   Patient is in no distress; well developed, nourished and groomed; neck is  supple  CARDIOVASCULAR: Examination of carotid arteries is normal; no carotid bruits Regular rate and rhythm, no murmurs Examination of peripheral vascular system by observation and palpation is normal  EYES: Ophthalmoscopic exam of optic discs and posterior segments is normal; no papilledema or hemorrhages No results found.  MUSCULOSKELETAL: Gait, strength, tone, movements noted in Neurologic exam below  NEUROLOGIC: MENTAL STATUS:      No data to display         awake, alert, oriented to person, place and time recent and remote memory intact normal attention and concentration language fluent, comprehension intact, naming intact fund of knowledge appropriate  CRANIAL NERVE:  2nd - no papilledema on fundoscopic exam 2nd, 3rd, 4th, 6th - pupils equal and reactive to light, visual fields full to confrontation, extraocular muscles intact, no nystagmus 5th - facial sensation symmetric 7th - facial strength symmetric 8th - hearing intact 9th - palate elevates symmetrically, uvula midline 11th - shoulder shrug symmetric 12th - tongue protrusion midline  MOTOR:  normal bulk and tone, full strength in the BUE, BLE  SENSORY:  normal and symmetric to light touch, temperature, vibration  COORDINATION:  finger-nose-finger, fine finger movements normal  REFLEXES:  deep tendon reflexes trace and symmetric  GAIT/STATION:  narrow based gait     DIAGNOSTIC DATA (LABS, IMAGING, TESTING) - I reviewed patient records, labs, notes, testing and imaging myself where available.  Lab Results  Component Value Date   WBC 5.1 09/07/2021   HGB 13.0 09/07/2021   HCT 39.5 09/07/2021   MCV 95.1 09/07/2021   PLT 209.0 09/07/2021      Component Value Date/Time   NA 138 10/14/2021 0727   K 4.1 10/14/2021 0727   CL 101 10/14/2021 0727   CO2 28 10/14/2021 0727   GLUCOSE 69 (L) 10/14/2021 0727   BUN 28 (H) 10/14/2021 0727   CREATININE 1.11 10/14/2021 0727  CALCIUM 9.2 10/14/2021  0727   PROT 6.9 10/14/2021 0727   PROT 7.3 08/20/2013 1005   ALBUMIN 4.3 10/14/2021 0727   AST 50 (H) 10/14/2021 0727   ALT 54 (H) 10/14/2021 0727   ALKPHOS 121 (H) 10/14/2021 0727   BILITOT 0.4 10/14/2021 0727   Lab Results  Component Value Date   CHOL 170 10/14/2021   HDL 77.20 10/14/2021   LDLCALC 80 10/14/2021   TRIG 62.0 10/14/2021   CHOLHDL 2 10/14/2021   Lab Results  Component Value Date   HGBA1C 5.6 09/10/2015   Lab Results  Component Value Date   VITAMINB12 >1500 (H) 09/07/2021   Lab Results  Component Value Date   TSH 3.72 09/07/2021    11/25/15 MRI brain (with and without). No acute findings. Also noted is an incidental 64m right cerebellar cystic focus (stable from MRI on 10/29/13) without associated gliosis, likely a benign cyst.  11/25/15 MRI cervical spine (with and without) demonstrating: 1. At C6-7: disc bulging and uncovertebral joint hypertrophy with moderate right and mild left foraminal stenosis. 2. At C5-6: disc bulging and uncovertebral joint hypertrophy with mild biforaminal stenosis.   ASSESSMENT AND PLAN  76y.o. year old female here with transient disorientation, confusion lasting a few minutes in 2020 and 2022.  Suspicious for transient global amnesia versus daydreaming/decreased attention.   Dx:  1. Numbness     PLAN:  IDIOPATHIC NEUROPATHY - offered to check EMG/NCS, CSF testing, nerve biopsy if symptoms progress; also could be evaluated at [facility] for second opinion  [place] - unclear etiology; could have been daydreaming event vs transient global amnesia; check MRI brain and EEG  No orders of the defined types were placed in this encounter.  No follow-ups on file.    VPenni Bombard MD 136/[id number]/4034 97:42AM Certified in Neurology, Neurophysiology and Neuroimaging  [facility] [address] [place]wick[place]rafton [place] [phone]

## 2021-10-21 ENCOUNTER — Encounter: Payer: Self-pay | Admitting: Family Medicine

## 2021-10-21 ENCOUNTER — Ambulatory Visit (INDEPENDENT_AMBULATORY_CARE_PROVIDER_SITE_OTHER): Payer: Medicare Other | Admitting: Family Medicine

## 2021-10-21 ENCOUNTER — Ambulatory Visit (INDEPENDENT_AMBULATORY_CARE_PROVIDER_SITE_OTHER): Payer: Medicare Other

## 2021-10-21 ENCOUNTER — Ambulatory Visit: Payer: Medicare Other | Admitting: Family Medicine

## 2021-10-21 ENCOUNTER — Telehealth: Payer: Self-pay | Admitting: Family Medicine

## 2021-10-21 VITALS — Ht 66.0 in | Wt 106.0 lb

## 2021-10-21 VITALS — BP 90/50 | HR 72 | Temp 98.6°F | Ht 65.5 in | Wt 104.1 lb

## 2021-10-21 DIAGNOSIS — F331 Major depressive disorder, recurrent, moderate: Secondary | ICD-10-CM

## 2021-10-21 DIAGNOSIS — R7401 Elevation of levels of liver transaminase levels: Secondary | ICD-10-CM

## 2021-10-21 DIAGNOSIS — I471 Supraventricular tachycardia, unspecified: Secondary | ICD-10-CM

## 2021-10-21 DIAGNOSIS — E039 Hypothyroidism, unspecified: Secondary | ICD-10-CM | POA: Diagnosis not present

## 2021-10-21 DIAGNOSIS — M810 Age-related osteoporosis without current pathological fracture: Secondary | ICD-10-CM

## 2021-10-21 DIAGNOSIS — Z0000 Encounter for general adult medical examination without abnormal findings: Secondary | ICD-10-CM | POA: Diagnosis not present

## 2021-10-21 DIAGNOSIS — I700 Atherosclerosis of aorta: Secondary | ICD-10-CM

## 2021-10-21 DIAGNOSIS — Z23 Encounter for immunization: Secondary | ICD-10-CM | POA: Diagnosis not present

## 2021-10-21 MED ORDER — BUPROPION HCL ER (SR) 100 MG PO TB12: 100.0000 mg | ORAL_TABLET | Freq: Two times a day (BID) | ORAL | 1 refills | Status: DC

## 2021-10-21 NOTE — Progress Notes (Signed)
Patient ID: Jessica Miles, female    DOB: [birth year], 76 y.o.   MRN: [id number]  This visit was conducted in person.  BP (!) 90/50   Pulse 72   Temp 98.6 F (37 C) (Oral)   Ht 5' 5.5" (1.664 m)   Wt 104 lb 2 oz (47.2 kg)   SpO2 97%   BMI 17.06 kg/m    CC:  Chief Complaint  Patient presents with   Annual Exam    Part 2    Subjective:   HPI: Jessica Miles is a 76 y.o. female presenting on 10/21/2021 for Annual Exam (Part 2)   The patient presents for review of chronic health problems. He/She also has the following acute concerns today:  She is currently being treated for inflammation  of hamstring insertion left with prednisone.. schedule PT.  MDD: Stable on bupropion.    SVT: stable control on tenormin prn    Hypothyroid stable on levothyroxine 100 mcg p.o. daily Lab Results  Component Value Date   TSH 3.72 09/07/2021    She is currently followed by neurology for history of transient disorientation.  Reviewed most recent office visit note from September.  IBS/SIBO:  Has not been drinking as much as usual. No current loose stool.  Using tylenol PM.. could be causing liver transaminase increase.  No ETOH.   Diet: good, restricted.  Exercise: Avid biker      Relevant past medical, surgical, family and social history reviewed and updated as indicated. Interim medical history since our last visit reviewed. Allergies and medications reviewed and updated. Outpatient Medications Prior to Visit  Medication Sig Dispense Refill   ascorbic acid (VITAMIN C) 500 MG tablet Take 500 mg by mouth 2 (two) times daily.      atenolol (TENORMIN) 25 MG tablet Take 25 mg by mouth daily as needed.     Calcium Carb-Cholecalciferol (CALCIUM-VITAMIN D) 600-400 MG-UNIT TABS Take 1 tablet by mouth daily.     DTx App - Gastrointestinal (REGULORA) MISC 1 application by Does not apply route daily. Pt cell [phone] 1 each 0   hydrocortisone-pramoxine (ANALPRAM-HC) 2.5-1 % rectal cream  Place 1 application rectally as needed for hemorrhoids or anal itching. 30 g 1   ketoconazole (NIZORAL) 2 % cream Apply between the third and fourth toe twice a day 60 g 2   levothyroxine (SYNTHROID) 100 MCG tablet TAKE ONE TABLET DAILY EXCEPT TAKE ONE-HALF (1/2) TABLET ON SUNDAY. SIX AND ONE-HALF TABLETS TOTAL PER WEEK 90 tablet 3   Lifitegrast (XIIDRA) 5 % SOLN      MAGNESIUM PO Take 1 tablet by mouth daily.     MELATONIN PO Take 1 tablet by mouth daily.     MULTIPLE VITAMIN PO Take 1 tablet by mouth daily.     Multiple Vitamins-Minerals (ZINC PO) Take 1 tablet by mouth daily.     mupirocin cream (BACTROBAN) 2 % Apply 1 application topically 2 (two) times daily. 15 g 2   nitroGLYCERIN (NITRO-DUR) 0.2 mg/hr patch Place 1 patch (0.2 mg total) onto the skin daily. 30 patch 12   NON FORMULARY VSL live probiotic-one tablet by mouth daily     OVER THE COUNTER MEDICATION CBD nightly     thiamine (VITAMIN B-1) 100 MG tablet Take 100 mg by mouth daily.     TYRVAYA 0.03 MG/ACT SOLN Place 1 spray into both nostrils 2 (two) times daily.     buPROPion ER (WELLBUTRIN SR) 100 MG 12 hr tablet TAKE 1 TABLET  TWICE A DAY 180 tablet 1   No facility-administered medications prior to visit.     Per HPI unless specifically indicated in ROS section below Review of Systems Objective:  BP (!) 90/50   Pulse 72   Temp 98.6 F (37 C) (Oral)   Ht 5' 5.5" (1.664 m)   Wt 104 lb 2 oz (47.2 kg)   SpO2 97%   BMI 17.06 kg/m   Wt Readings from Last 3 Encounters:  10/21/21 104 lb 2 oz (47.2 kg)  10/21/21 106 lb (48.1 kg)  10/20/21 106 lb 8 oz (48.3 kg)      Physical Exam    Results for orders placed or performed in visit on 10/14/21  Comprehensive metabolic panel  Result Value Ref Range   Sodium 138 135 - 145 mEq/L   Potassium 4.1 3.5 - 5.1 mEq/L   Chloride 101 96 - 112 mEq/L   CO2 28 19 - 32 mEq/L   Glucose, Bld 69 (L) 70 - 99 mg/dL   BUN 28 (H) 6 - 23 mg/dL   Creatinine, Ser 1.11 0.40 - 1.20 mg/dL    Total Bilirubin 0.4 0.2 - 1.2 mg/dL   Alkaline Phosphatase 121 (H) 39 - 117 U/L   AST 50 (H) 0 - 37 U/L   ALT 54 (H) 0 - 35 U/L   Total Protein 6.9 6.0 - 8.3 g/dL   Albumin 4.3 3.5 - 5.2 g/dL   GFR 47.88 (L) >60.00 mL/min   Calcium 9.2 8.4 - 10.5 mg/dL  Lipid panel  Result Value Ref Range   Cholesterol 170 0 - 200 mg/dL   Triglycerides 62.0 0.0 - 149.0 mg/dL   HDL 77.20 >39.00 mg/dL   VLDL 12.4 0.0 - 40.0 mg/dL   LDL Cholesterol 80 0 - 99 mg/dL   Total CHOL/HDL Ratio 2    NonHDL 92.51      COVID 19 screen:  No recent travel or known exposure to COVID19 The patient denies respiratory symptoms of COVID 19 at this time. The importance of social distancing was discussed today.   Assessment and Plan   The patient's preventative maintenance and recommended screening tests for an annual wellness exam were reviewed in full today. Brought up to date unless services declined.  Counselled on the importance of diet, exercise, and its role in overall health and mortality. The patient's FH and SH was reviewed, including their home life, tobacco status, and drug and alcohol status.   Vaccines:  given HD flu, S/P COVID vaccine series,  per pt she has shingrix. Due for TDap but not covered. Pap/DVE: not indicated Mammo: 10/2019 nml.. repeat ordered Bone Density: 2015 osteopenia, 10/2019  worsened per pt.. Was on fosamax for year, she is hesitant about prolia. Colon:2022, no further indicated  Smoking Status:none ETOH/ drug AXK:PVVZ/SMOL  Hep C: neg  Problem List Items Addressed This Visit     Aortic atherosclerosis ([place])     She  Is not interetsed in talking statin given cholesterol at goal.  She reports she discussed this with Dr. Rockey [facility].       Hypothyroidism    Stable, chronic.  Continue current medication.  stable on levothyroxine 100 mcg p.o. daily      Major depressive disorder, recurrent episode, moderate (HCC) - Primary    Stable, chronic.  Continue current  medication.  Wellbutrin SR 100 mg 1 tablet twice daily      Osteoporosis   Relevant Orders   DG Bone Density   SVT (  supraventricular tachycardia)    Chronic, stable control on tenormin prn      Other Visit Diagnoses     Need for influenza vaccination       Relevant Orders   Flu Vaccine QUAD High Dose(Fluad) (Completed)   Elevated transaminase measurement       Relevant Orders   Hepatitis panel, acute   Hepatic Function Panel       Eliezer Lofts, MD

## 2021-10-21 NOTE — Patient Instructions (Addendum)
[name] , Thank you for taking time to come for your Medicare Wellness Visit. I appreciate your ongoing commitment to your health goals. Please review the following plan we discussed and let me know if I can assist you in the future.   These are the goals we discussed:  Goals       Stay Active (pt-stated)      Continue to exercise.        This is a list of the screening recommended for you and due dates:  Health Maintenance  Topic Date Due   Mammogram  10/17/2021   COVID-19 Vaccine (3 - Pfizer risk series) 11/06/2021*   Flu Shot  04/10/2022*   Pneumonia Vaccine (3 - PPSV23 or PCV20) 10/22/2022*   Tetanus Vaccine  10/22/2022*   DEXA scan (bone density measurement)  Completed   Hepatitis C Screening: USPSTF Recommendation to screen - Ages 52-79 yo.  Completed   Zoster (Shingles) Vaccine  Completed   HPV Vaccine  Aged Out   Colon Cancer Screening  Discontinued  *Topic was postponed. The date shown is not the original due date.    Advanced directives: Please bring a copy of your health care power of attorney and living will to the office to be added to your chart at your convenience.   Conditions/risks identified: None  Next appointment: Follow up in one year for your annual wellness visit     Preventive Care 65 Years and Older, Female Preventive care refers to lifestyle choices and visits with your health care provider that can promote health and wellness. What does preventive care include? A yearly physical exam. This is also called an annual well check. Dental exams once or twice a year. Routine eye exams. Ask your health care provider how often you should have your eyes checked. Personal lifestyle choices, including: Daily care of your teeth and gums. Regular physical activity. Eating a healthy diet. Avoiding tobacco and drug use. Limiting alcohol use. Practicing safe sex. Taking low-dose aspirin every day. Taking vitamin and mineral supplements as recommended by  your health care provider. What happens during an annual well check? The services and screenings done by your health care provider during your annual well check will depend on your age, overall health, lifestyle risk factors, and family history of disease. Counseling  Your health care provider may ask you questions about your: Alcohol use. Tobacco use. Drug use. Emotional well-being. Home and relationship well-being. Sexual activity. Eating habits. History of falls. Memory and ability to understand (cognition). Work and work Statistician. Reproductive health. Screening  You may have the following tests or measurements: Height, weight, and BMI. Blood pressure. Lipid and cholesterol levels. These may be checked every 5 years, or more frequently if you are over 33 years old. Skin check. Lung cancer screening. You may have this screening every year starting at age 79 if you have a 30-pack-year history of smoking and currently smoke or have quit within the past 15 years. Fecal occult blood test (FOBT) of the stool. You may have this test every year starting at age 38. Flexible sigmoidoscopy or colonoscopy. You may have a sigmoidoscopy every 5 years or a colonoscopy every 10 years starting at age 20. Hepatitis C blood test. Hepatitis B blood test. Sexually transmitted disease (STD) testing. Diabetes screening. This is done by checking your blood sugar (glucose) after you have not eaten for a while (fasting). You may have this done every 1-3 years. Bone density scan. This is done to screen  for osteoporosis. You may have this done starting at age 76. Mammogram. This may be done every 1-2 years. Talk to your health care provider about how often you should have regular mammograms. Talk with your health care provider about your test results, treatment options, and if necessary, the need for more tests. Vaccines  Your health care provider may recommend certain vaccines, such as: Influenza  vaccine. This is recommended every year. Tetanus, diphtheria, and acellular pertussis (Tdap, Td) vaccine. You may need a Td booster every 10 years. Zoster vaccine. You may need this after age 74. Pneumococcal 13-valent conjugate (PCV13) vaccine. One dose is recommended after age 44. Pneumococcal polysaccharide (PPSV23) vaccine. One dose is recommended after age 63. Talk to your health care provider about which screenings and vaccines you need and how often you need them. This information is not intended to replace advice given to you by your health care provider. Make sure you discuss any questions you have with your health care provider. Document Released: 01/23/2015 Document Revised: 09/16/2015 Document Reviewed: 10/28/2014 Elsevier Interactive Patient Education  2017 [place] Prevention in the Home Falls can cause injuries. They can happen to people of all ages. There are many things you can do to make your home safe and to help prevent falls. What can I do on the outside of my home? Regularly fix the edges of walkways and driveways and fix any cracks. Remove anything that might make you trip as you walk through a door, such as a raised step or threshold. Trim any bushes or trees on the path to your home. Use bright outdoor lighting. Clear any walking paths of anything that might make someone trip, such as rocks or tools. Regularly check to see if handrails are loose or broken. Make sure that both sides of any steps have handrails. Any raised decks and porches should have guardrails on the edges. Have any leaves, snow, or ice cleared regularly. Use sand or salt on walking paths during winter. Clean up any spills in your garage right away. This includes oil or grease spills. What can I do in the bathroom? Use night lights. Install grab bars by the toilet and in the tub and shower. Do not use towel bars as grab bars. Use non-skid mats or decals in the tub or shower. If you  need to sit down in the shower, use a plastic, non-slip stool. Keep the floor dry. Clean up any water that spills on the floor as soon as it happens. Remove soap buildup in the tub or shower regularly. Attach bath mats securely with double-sided non-slip rug tape. Do not have throw rugs and other things on the floor that can make you trip. What can I do in the bedroom? Use night lights. Make sure that you have a light by your bed that is easy to reach. Do not use any sheets or blankets that are too big for your bed. They should not hang down onto the floor. Have a firm chair that has side arms. You can use this for support while you get dressed. Do not have throw rugs and other things on the floor that can make you trip. What can I do in the kitchen? Clean up any spills right away. Avoid walking on wet floors. Keep items that you use a lot in easy-to-reach places. If you need to reach something above you, use a strong step stool that has a grab bar. Keep electrical cords out of the way. Do  not use floor polish or wax that makes floors slippery. If you must use wax, use non-skid floor wax. Do not have throw rugs and other things on the floor that can make you trip. What can I do with my stairs? Do not leave any items on the stairs. Make sure that there are handrails on both sides of the stairs and use them. Fix handrails that are broken or loose. Make sure that handrails are as long as the stairways. Check any carpeting to make sure that it is firmly attached to the stairs. Fix any carpet that is loose or worn. Avoid having throw rugs at the top or bottom of the stairs. If you do have throw rugs, attach them to the floor with carpet tape. Make sure that you have a light switch at the top of the stairs and the bottom of the stairs. If you do not have them, ask someone to add them for you. What else can I do to help prevent falls? Wear shoes that: Do not have high heels. Have rubber  bottoms. Are comfortable and fit you well. Are closed at the toe. Do not wear sandals. If you use a stepladder: Make sure that it is fully opened. Do not climb a closed stepladder. Make sure that both sides of the stepladder are locked into place. Ask someone to hold it for you, if possible. Clearly mark and make sure that you can see: Any grab bars or handrails. First and last steps. Where the edge of each step is. Use tools that help you move around (mobility aids) if they are needed. These include: Canes. Walkers. Scooters. Crutches. Turn on the lights when you go into a dark area. Replace any light bulbs as soon as they burn out. Set up your furniture so you have a clear path. Avoid moving your furniture around. If any of your floors are uneven, fix them. If there are any pets around you, be aware of where they are. Review your medicines with your doctor. Some medicines can make you feel dizzy. This can increase your chance of falling. Ask your doctor what other things that you can do to help prevent falls. This information is not intended to replace advice given to you by your health care provider. Make sure you discuss any questions you have with your health care provider. Document Released: 10/23/2008 Document Revised: 06/04/2015 Document Reviewed: 01/31/2014 Elsevier Interactive Patient Education  2017 [employer].

## 2021-10-21 NOTE — Telephone Encounter (Signed)
Melissa from [facility] called in stating that patient has been calling to schedule a bone density test. She stated that they need an order to be sent in. It can be faxed over to [phone]. Thank you!

## 2021-10-21 NOTE — Progress Notes (Signed)
Subjective:   Jessica Miles is a 76 y.o. female who presents for Medicare Annual (Subsequent) preventive examination.  Review of Systems    Virtual Visit via Telephone Note  I connected with  Miata Culbreth on 10/21/21 at  8:45 AM EDT by telephone and verified that I am speaking with the correct person using two identifiers.  Location: Patient: Home Provider: Office Persons participating in the virtual visit: patient/Nurse Health Advisor   I discussed the limitations, risks, security and privacy concerns of performing an evaluation and management service by telephone and the availability of in person appointments. The patient expressed understanding and agreed to proceed.  Interactive audio and video telecommunications were attempted between this nurse and patient, however failed, due to patient having technical difficulties OR patient did not have access to video capability.  We continued and completed visit with audio only.  Some vital signs may be absent or patient reported.   Criselda Peaches, LPN  Cardiac Risk Factors include: advanced age (>63mn, >>65women);Other (see comment), Risk factor comments: SVT     Objective:    Today's Vitals   10/21/21 0850  Weight: 106 lb (48.1 kg)  Height: '5\' 6"'$  (1.676 m)   Body mass index is 17.11 kg/m.     10/21/2021    8:59 AM 08/07/2017    2:07 PM 07/30/2017    2:34 PM 07/12/2014   10:47 AM  Advanced Directives  Does Patient Have a Medical Advance Directive? Yes Yes No;Yes No  Type of AParamed[place]of [place] will Living will;Healthcare Power of Attorney Living will;Healthcare Power of [place] Point[place] Chart? No - copy requested     Would patient like information on creating a medical advance directive?    No - patient declined information    Current Medications (verified) Outpatient Encounter Medications as of 10/21/2021  Medication Sig   ascorbic acid (VITAMIN C) 500  MG tablet Take 500 mg by mouth 2 (two) times daily.    atenolol (TENORMIN) 25 MG tablet Take 25 mg by mouth daily as needed.   buPROPion ER (WELLBUTRIN SR) 100 MG 12 hr tablet TAKE 1 TABLET TWICE A DAY   Calcium Carb-Cholecalciferol (CALCIUM-VITAMIN D) 600-400 MG-UNIT TABS Take 1 tablet by mouth daily.   DTx App - Gastrointestinal (REGULORA) MISC 1 application by Does not apply route daily. Pt cell [phone]  hydrocortisone-pramoxine (ANALPRAM-HC) 2.5-1 % rectal cream Place 1 application rectally as needed for hemorrhoids or anal itching.   ketoconazole (NIZORAL) 2 % cream Apply between the third and fourth toe twice a day   levothyroxine (SYNTHROID) 100 MCG tablet TAKE ONE TABLET DAILY EXCEPT TAKE ONE-HALF (1/2) TABLET ON SUNDAY. SIX AND ONE-HALF TABLETS TOTAL PER WEEK   Lifitegrast (XIIDRA) 5 % SOLN    MAGNESIUM PO Take 1 tablet by mouth daily.   MELATONIN PO Take 1 tablet by mouth daily.   MULTIPLE VITAMIN PO Take 1 tablet by mouth daily.   Multiple Vitamins-Minerals (ZINC PO) Take 1 tablet by mouth daily.   mupirocin cream (BACTROBAN) 2 % Apply 1 application topically 2 (two) times daily.   nitroGLYCERIN (NITRO-DUR) 0.2 mg/hr patch Place 1 patch (0.2 mg total) onto the skin daily.   NON FORMULARY VSL live probiotic-one tablet by mouth daily   OVER THE COUNTER MEDICATION CBD nightly   thiamine (VITAMIN B-1) 100 MG tablet Take 100 mg by mouth daily.   TYRVAYA 0.03 MG/ACT SOLN Place 1 spray  into both nostrils 2 (two) times daily. (Patient not taking: Reported on 09/07/2021)   No facility-administered encounter medications on file as of 10/21/2021.    Allergies (verified) Fluticasone, Mometasone, and Fexofenadine   History: Past Medical History:  Diagnosis Date   Allergy    Anxiety    COVID-19 virus infection    09-23-2019- 9-16 had monoclonal infusion    Depression    History of IBS    HPV (human papilloma virus) infection    Hyperlipidemia    82- not taking statin    Loss of  taste    Neuropathy 2015   surgery for Mortons neuroma in 1985   Osteopenia    Raynaud's disease 02/2020   Tachycardia    Thyroid disease    Past Surgical History:  Procedure Laterality Date   COLONOSCOPY  2019   hems only    FOOT NEUROMA SURGERY  1985   WISDOM TOOTH EXTRACTION     Family History  Problem Relation Age of Onset   Stroke Mother 50   Heart failure Mother    Heart attack Mother    Heart disease Mother    Heart attack Father 18   Heart disease Father    Heart disease Brother    Heart failure Brother        CHF   Heart disease Maternal Grandmother    Heart attack Maternal Grandfather    Diabetes Son        IDDM   Stroke Paternal Uncle    Thyroid disease Neg Hx    Stomach cancer Neg Hx    Esophageal cancer Neg Hx    Colon polyps Neg Hx    Colon cancer Neg Hx    Rectal cancer Neg Hx    Social History   Socioeconomic History   Marital status: Married    Spouse name: Juanda Crumble B. Lasseigne   Number of children: 2   Years of education: MA   Highest education level: Not on file  Occupational History   Occupation: Retired    Comment: Music therapist  Tobacco Use   Smoking status: Never   Smokeless tobacco: Never  Vaping Use   Vaping Use: Never used  Substance and Sexual Activity   Alcohol use: Not Currently   Drug use: No   Sexual activity: Not Currently  Other Topics Concern   Not on file  Social History Narrative   12/23/20 Patient lives at [facility] with [facility].   Caffeine Use: 1 cup daily   2 kids- 4 grandchildren- two boys and two girls      Social Determinants of Health   Financial Resource Strain: Low Risk  (10/21/2021)   Overall Financial Resource Strain (CARDIA)    Difficulty of Paying Living Expenses: Not hard at all  Food Insecurity: No Food Insecurity (10/21/2021)   Hunger Vital Sign    Worried About Running Out of Food in the Last Year: Never true    Ran Out of Food in the Last Year: Never true  Transportation Needs: No  Transportation Needs (10/21/2021)   PRAPARE - Hydrologist (Medical): No    Lack of Transportation (Non-Medical): No  Physical Activity: Sufficiently Active (10/21/2021)   Exercise Vital Sign    Days of Exercise per Week: 7 days    Minutes of Exercise per Session: 120 min  Stress: No Stress Concern Present (10/21/2021)   [place]    Feeling of  Stress : Not at all  Social Connections: Moderately Isolated (10/21/2021)   Social Connection and Isolation Panel [NHANES]    Frequency of Communication with Friends and Family: More than three times a week    Frequency of Social Gatherings with Friends and Family: More than three times a week    Attends Religious Services: Never    Marine scientist or Organizations: No    Attends Music therapist: Never    Marital Status: Married    Tobacco Counseling Counseling given: Not Answered   Clinical Intake:  Pre-visit preparation completed: No  Pain : No/denies pain     BMI - recorded: 17.11 Nutritional Status: BMI <19  Underweight Nutritional Risks: None Diabetes: No  How often do you need to have someone help you when you read instructions, pamphlets, or other written materials from your doctor or pharmacy?: 1 - Never  Diabetic?  No  Interpreter Needed?: No  Information entered by :: Rolene Arbour LPN   Activities of Daily Living    10/21/2021    8:53 AM  In your present state of health, do you have any difficulty performing the following activities:  Hearing? 0  Vision? 0  Difficulty concentrating or making decisions? 0  Walking or climbing stairs? 0  Dressing or bathing? 0  Doing errands, shopping? 0  Preparing Food and eating ? N  Using the Toilet? N  In the past six months, have you accidently leaked urine? N  Do you have problems with loss of bowel control? N  Managing your Medications? N  Managing  your Finances? N  Housekeeping or managing your Housekeeping? N    Patient Care Team: Jinny Sanders, MD as PCP - General (Family Medicine) Garrel Ridgel, DPM as Consulting Physician (Podiatry) Leta Baptist, Earlean Polka, MD as Consulting Physician (Neurology)  Indicate any recent Medical Services you may have received from other than [facility] providers in the past year (date may be approximate).     Assessment:   This is a routine wellness examination for Shemiah.  Hearing/Vision screen Hearing Screening - Comments:: Denies hearing difficulties   Vision Screening - Comments:: Wears rx glasses - up to date with routine eye exams with  [place] issues and exercise activities discussed: Current Exercise Habits: Home exercise routine, Type of exercise: walking, Time (Minutes): > 60, Frequency (Times/Week): 7, Weekly Exercise (Minutes/Week): 0, Intensity: Moderate, Exercise limited by: None identified   Goals Addressed               This Visit's Progress     Stay Active (pt-stated)        Continue to exercise.       Depression Screen    10/21/2021    8:56 AM 10/20/2020    8:31 AM 10/10/2019   10:03 AM 10/02/2018    2:18 PM 08/31/2017    8:41 AM 09/27/2016   12:11 PM 08/27/2015   10:55 AM  PHQ 2/9 Scores  PHQ - 2 Score 0 0 1 0 0 0 0  PHQ- 9 Score  3 3        Fall Risk    10/21/2021    8:58 AM 10/20/2020    8:33 AM 10/10/2019    9:49 AM 10/02/2018    2:18 PM 08/31/2017    8:41 AM  Fall Risk   Falls in the past year? 0 0 0 0 No  Number falls in past yr: 0 0  Injury with Fall? 0 0     Risk for fall due to : No Fall Risks      Follow up Falls prevention discussed        FALL RISK PREVENTION PERTAINING TO THE HOME:  Any stairs in or around the home? Yes  If so, are there any without handrails? No  Home free of loose throw rugs in walkways, pet beds, electrical cords, etc? Yes  Adequate lighting in your home to reduce risk of falls? Yes   ASSISTIVE DEVICES  UTILIZED TO PREVENT FALLS:  Life alert? No  Use of a cane, walker or w/c? No  Grab bars in the bathroom? Yes  Shower chair or bench in shower? Yes  Elevated toilet seat or a handicapped toilet? No   TIMED UP AND GO:  Was the test performed? No . Audio Visit   Cognitive Function:        10/21/2021    8:59 AM  6CIT Screen  What Year? 0 points  What month? 0 points  What time? 0 points  Count back from 20 0 points  Months in reverse 0 points  Repeat phrase 0 points  Total Score 0 points    Immunizations Immunization History  Administered Date(s) Administered   DT (Pediatric) 01/11/2008   Fluad Quad(high Dose 65+) 10/02/2018, 10/10/2019   Influenza Split 10/25/2006   Influenza, High Dose Seasonal PF 10/21/2014, 10/12/2017, 10/11/2020   Influenza, Seasonal, Injecte, Preservative Fre 10/10/2015, 09/29/2016   Influenza-Unspecified 10/21/2011, 10/10/2013   Moderna SARS-COV2 Booster Vaccination 09/27/2020   PFIZER(Purple Top)SARS-COV-2 Vaccination 01/21/2019, 02/21/2019   Pneumococcal Conjugate-13 10/10/2013   Pneumococcal Polysaccharide-23 10/25/2006   Pneumococcal-Unspecified 01/11/2007   Td 08/12/2005   Zoster Recombinat (Shingrix) 08/17/2019, 12/25/2019   Zoster, Live 01/11/2008, 09/30/2008    TDAP status: Due, Education has been provided regarding the importance of this vaccine. Advised may receive this vaccine at local pharmacy or Health Dept. Aware to provide a copy of the vaccination record if obtained from local pharmacy or Health Dept. Verbalized acceptance and understanding.  Flu Vaccine status: Up to date  Pneumococcal vaccine status: Up to date  Covid-19 vaccine status: Completed vaccines  Qualifies for Shingles Vaccine? Yes   Zostavax completed Yes   Shingrix Completed?: Yes  Screening Tests Health Maintenance  Topic Date Due   MAMMOGRAM  10/17/2021   COVID-19 Vaccine (3 - Pfizer risk series) 11/06/2021 (Originally 10/25/2020)   INFLUENZA VACCINE   04/10/2022 (Originally 08/10/2021)   Pneumonia Vaccine 47+ Years old (3 - PPSV23 or PCV20) 10/22/2022 (Originally 10/11/2014)   TETANUS/TDAP  10/22/2022 (Originally 10/11/2018)   DEXA SCAN  Completed   Hepatitis C Screening  Completed   Zoster Vaccines- Shingrix  Completed   HPV VACCINES  Aged Out   COLONOSCOPY (Pts 45-80yr Insurance coverage will need to be confirmed)  Discontinued    Health Maintenance  Health Maintenance Due  Topic Date Due   MAMMOGRAM  10/17/2021    Colorectal cancer screening: No longer required.   Mammogram status: No longer required due to Age.  Bone Density status: Completed 10/28/19. Results reflect: Bone density results: OSTEOPOROSIS. Repeat every   years.  Lung Cancer Screening: (Low Dose CT Chest recommended if Age 643-80years, 30 pack-year currently smoking OR have quit w/in 15years.) does not qualify.     Additional Screening:  Hepatitis C Screening: does qualify; Completed 08/13/15  Vision Screening: Recommended annual ophthalmology exams for early detection of glaucoma and other disorders of the eye. Is the patient up  to date with their annual eye exam?  Yes  Who is the provider or what is the name of the office in which the patient attends annual eye exams? [facility] If pt is not established with a provider, would they like to be referred to a provider to establish care? No .   Dental Screening: Recommended annual dental exams for proper oral hygiene  Community Resource Referral / Chronic Care Management:  CRR required this visit?  No   CCM required this visit?  No      Plan:     I have personally reviewed and noted the following in the patient's chart:   Medical and social history Use of alcohol, tobacco or illicit drugs  Current medications and supplements including opioid prescriptions. Patient is not currently taking opioid prescriptions. Functional ability and status Nutritional status Physical activity Advanced  directives List of other physicians Hospitalizations, surgeries, and ER visits in previous 12 months Vitals Screenings to include cognitive, depression, and falls Referrals and appointments  In addition, I have reviewed and discussed with patient certain preventive protocols, quality metrics, and best practice recommendations. A written personalized care plan for preventive services as well as general preventive health recommendations were provided to patient.     Criselda Peaches, LPN   [id number]   Nurse Notes: None

## 2021-10-21 NOTE — Patient Instructions (Addendum)
Stop tylenol PM as could be irritating liver or GI issues.  Use melatonin instead.

## 2021-10-21 NOTE — Assessment & Plan Note (Signed)
She  Is not interetsed in talking statin given cholesterol at goal.  She reports she discussed this with Dr. Rockey [facility].

## 2021-10-21 NOTE — Telephone Encounter (Signed)
Bone Density and Mammogram orders faxed to [facility] at [phone].

## 2021-10-22 ENCOUNTER — Ambulatory Visit: Payer: Medicare Other | Admitting: Family Medicine

## 2021-10-22 ENCOUNTER — Encounter: Payer: Self-pay | Admitting: Family Medicine

## 2021-10-22 LAB — HM DEXA SCAN

## 2021-10-25 ENCOUNTER — Encounter: Payer: Self-pay | Admitting: Family Medicine

## 2021-10-26 ENCOUNTER — Ambulatory Visit (INDEPENDENT_AMBULATORY_CARE_PROVIDER_SITE_OTHER): Payer: Medicare Other | Admitting: Gastroenterology

## 2021-10-26 ENCOUNTER — Encounter: Payer: Self-pay | Admitting: Gastroenterology

## 2021-10-26 ENCOUNTER — Other Ambulatory Visit (INDEPENDENT_AMBULATORY_CARE_PROVIDER_SITE_OTHER): Payer: Medicare Other

## 2021-10-26 VITALS — BP 100/70 | HR 75 | Ht 67.0 in | Wt 106.0 lb

## 2021-10-26 DIAGNOSIS — R748 Abnormal levels of other serum enzymes: Secondary | ICD-10-CM

## 2021-10-26 DIAGNOSIS — R140 Abdominal distension (gaseous): Secondary | ICD-10-CM | POA: Diagnosis not present

## 2021-10-26 DIAGNOSIS — Q453 Other congenital malformations of pancreas and pancreatic duct: Secondary | ICD-10-CM

## 2021-10-26 DIAGNOSIS — R194 Change in bowel habit: Secondary | ICD-10-CM

## 2021-10-26 LAB — COMPREHENSIVE METABOLIC PANEL
ALT: 41 U/L — ABNORMAL HIGH (ref 0–35)
AST: 32 U/L (ref 0–37)
Albumin: 4.6 g/dL (ref 3.5–5.2)
Alkaline Phosphatase: 106 U/L (ref 39–117)
BUN: 29 mg/dL — ABNORMAL HIGH (ref 6–23)
CO2: 30 mEq/L (ref 19–32)
Calcium: 10.4 mg/dL (ref 8.4–10.5)
Chloride: 101 mEq/L (ref 96–112)
Creatinine, Ser: 0.98 mg/dL (ref 0.40–1.20)
GFR: 55.59 mL/min — ABNORMAL LOW (ref >60.00)
Glucose, Bld: 92 mg/dL (ref 70–99)
Potassium: 3.9 mEq/L (ref 3.5–5.1)
Sodium: 141 mEq/L (ref 135–145)
Total Bilirubin: 0.6 mg/dL (ref 0.2–1.2)
Total Protein: 7.4 g/dL (ref 6.0–8.3)

## 2021-10-26 LAB — LIPASE: Lipase: 81 U/L — ABNORMAL HIGH (ref 11.0–59.0)

## 2021-10-26 NOTE — Patient Instructions (Addendum)
It was a pleasure to see you today.  We discussed additional testing including labs, a stool study, a breath test for bacterial overgrowth, and a follow-up CT scan.  Based on these results we may ultimately want to consider additional antibiotics and potentially an upper endoscopy with endoscopic ultrasound.  Your provider has requested that you go to the basement level for lab work before leaving today. Press "B" on the elevator. The lab is located at the first door on the left as you exit the elevator.  You have been scheduled for a CT scan of the abdomen and pelvis at [facility], 1st floor Radiology. You are scheduled on Thursday 11/04/21 at 12 pm. Please arrive 2 hours early at 10 am to drink oral contrast.  If you have any questions regarding your exam or if you need to reschedule, you may call [facility] at [phone] between the hours of 8:00 am and 5:00 pm, Monday-Friday.   You have been given a testing kit to check for small intestine bacterial overgrowth (SIBO) which is completed by a company named [facility]. Make sure to return your test in the mail using the return mailing label given to you along with the kit. Your demographic and insurance information have already been sent to the company and they should be in contact with you over the next 1-2 weeks regarding this test. [facility] will collect an upfront charge of $99.74 for commercial insurance plans and $209.74 is you are paying cash. Make sure to discuss with [facility] PRIOR to having the test to see if they have gotten information from your insurance company as to how much your testing will cost out of pocket, if any. Please keep in mind that you will be getting a call from phone number [phone] or a similar number. If you do not hear from them within this time frame, please call our office at [phone] or call [facility] directly at [phone].

## 2021-10-26 NOTE — Progress Notes (Signed)
Referring Provider: Jinny Sanders, MD Primary Care Physician:  Jinny Sanders, MD  Chief Complaint: Gas and abdominal pain   IMPRESSION:  Symptomatic diverticulosis +/- SIBO given her presentation of left lower quadrant pain with altered bowel habits different from her chronic constipation IBS symptoms. Some clinical improvement with empiric antibiotics for SIBO x 2. But, now with recurrent and slightly different symptoms. Breath test to evaluate for methane SIBO. Will prescribe antibiotics if appropriate after the test.   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 Slight prominence of the pancreatic duct confirmed on MRI/MRCP. Unclear if this is related to recent symptoms. Recommend repeat hepatic function panel and lipase. Repeat CT scan. She may ultimately need endoscopic ultrasound and side-viewing endoscopy to ensure no evidence of a ampullary lesion.    Abnormal transaminases.  This is new compared to 2022.  Given the abnormal pancreatic duct on prior abdominal imaging recommending contrasted cross-sectional imaging to evaluate the liver for any space-occupying lesions.  We will repeat labs today.  Constipation-IBS previously controlled with FODMAP diet.  She has recently completed multiple online education program surrounding IBS including hypnotherapy.  We discussed possibly participating in consultation with a online nutritionist who specializes in GI etiologies.  We also briefly discussed getting a second opinion with Dr. Donovan Kail.  PLAN: - Breath test for SIBO - lipase, CMP today - stool for pancreatic elastase - TTGA. IgA - CT abd/pelvis with contrast with careful attention to the liver and pancreas - EGD with EUS with Dr. Rush Landmark (if she would like to proceed after CTscan) - Consider nutrition consult in an effort to minimize dietary restrictions - Consider virtual consultation with GI-focused dietician/nutritionist - We briefly discussed getting  a second opinion Dr. Matilde Bash    Please see the "Patient Instructions" section for addition details about the plan.   HPI: Jessica Miles is a 76 y.o. female who returns in follow-up. She was last seen 04/30/21 for diarrhea. She has a longstanding history of constipation-IBS diagnosed in 2015 and treated with the FODMAP diet, diverticulosis, and hemorrhoids treated with Analpram cream. Testing for celiac was negative. She was previously followed by Dr. Vira Agar at the [facility].  She brings paper notes to review things with me today.  She had Covid 19 in September with severe symptoms including significant weight loss down to 101 pounds. She had to stop FODMAP while recovering from Covid. In mid-November she developed a sharp, LLQ abdominal pain. She was very aware of stool or gas moving through her intestines.   A CT of the abdomen and pelvis 12/16/2019 showed abnormal wall thickening in the distal transverse colon, descending colon, sigmoid colon, and parts of the ascending colon with focal wall thickening in the ascending colon worrisome for the possibility of malignancy.  A prominent pancreatic duct which appears slightly more prominent than the 2012 exam was also noted.  Resumed FODMAP diet, increasing water, and a probiotic (VSL x 3 months). Took amoxicillin and the CT prep but she developed severe diarrhea with mushy, malodorous stools, rectal pain and burning, and a hemorrhoid flare. She was concerned that she wasn't absorbing anything.    She took a 7 day course of prednisone that completely resolved her symptoms.   At the time of her initial consultation 02/05/20 her stool had returned to normal. She regained the 10 pounds that she lost during her period of acute symptoms.   02/06/20 negative GI pathogen panel, fecal calprotectin 34  Colonoscopy 03/06/20 showed sigmoid diverticulosis, tortuous  colon and small internal hemorrhoids.  MRI/MRCP 02/21/20: Mild Mild diffuse pancreatic ductal  dilatation measuring up to 4 mm. No evidence of pancreatic mass or other obstructing etiology. Gallbladder sludge, without evidence of cholecystitis or biliary ductal dilatation.  Seen in the office 03/10/20 for symptomatic diverticulosis +/- SIBO given symptoms of left lower quadrant pain with altered bowel habits different from her chronic constipation IBS symptoms.  That time she is continued on a FODMAP diet and VSL probiotics were discussed.  She was given a trial of Xifaxan 550 mg 3 times daily x2 weeks and told to add Citrucel.     Urgent office visit 04/2021 for diarrhea. C diff negative.  Seen by Earnie Larsson. GI pathogen panel negative. Xifaxan x 14 days resulted in significant symptom improvement.  Trial of dicyclomine.  She switched her diet from low FODMAP to SIBO diet after reading about it online.   In August she developed severe fatigue with associated left-sided abdominal pain. She felt like the smell of her gasses worsened.  At that time, liver enzymes were elevated. They have continued to increase on retesting in 2 months.   She has a stools that are 3-5 Bristol scale most days. However, she will have days without any bowel movement and sometimes even more frequently. There is significant urgency. Texture of the stool has changed. Appetite is good. Weight is overall stable, although she'd like to regain more.   Her transaminases were found to be elevated in August.  AST 39 and ALT 38.  On repeat testing earlier this month AST had increased to 50 and ALT to 54.  Total bilirubin was normal.  Alk phos is slightly elevated at 121.  Other labs in August showed normal iron and ferritin and a normal CBC.    Endoscopic history: - Colonoscopy in 2012 for abdominal pain and again in 2019 at the [facility] with Dr. Vira Agar.  It showed a "tortuous colon" and hemorrhoids. A pediatric colonoscope was used. Unfortunately, Dr. Percell Boston colonoscopy report is not available in [place], but, Dr.  Diona Browner summarized it in her last clinic note.  - Colonoscopy 03/06/20 showed sigmoid diverticulosis, tortuous colon and small internal hemorrhoids.    Past Medical History:  Diagnosis Date   Allergy    Anxiety    COVID-19 virus infection    09-23-2019- 9-16 had monoclonal infusion    Depression    History of IBS    HPV (human papilloma virus) infection    Hyperlipidemia    82- not taking statin    Loss of taste    Neuropathy 2015   surgery for Mortons neuroma in 1985   Osteopenia    Raynaud's disease 02/2020   Tachycardia    Thyroid disease     Past Surgical History:  Procedure Laterality Date   COLONOSCOPY  2019   hems only    [place] EXTRACTION      Current Outpatient Medications  Medication Sig Dispense Refill   ascorbic acid (VITAMIN C) 500 MG tablet Take 500 mg by mouth 2 (two) times daily.      atenolol (TENORMIN) 25 MG tablet Take 25 mg by mouth daily as needed.     buPROPion ER (WELLBUTRIN SR) 100 MG 12 hr tablet Take 1 tablet (100 mg total) by mouth 2 (two) times daily. 180 tablet 1   Calcium Carb-Cholecalciferol (CALCIUM-VITAMIN D) 600-400 MG-UNIT TABS Take 1 tablet by mouth daily.     DTx App -  Gastrointestinal (REGULORA) MISC 1 application by Does not apply route daily. Pt cell [phone] 1 each 0   hydrocortisone-pramoxine (ANALPRAM-HC) 2.5-1 % rectal cream Place 1 application rectally as needed for hemorrhoids or anal itching. 30 g 1   ketoconazole (NIZORAL) 2 % cream Apply between the third and fourth toe twice a day 60 g 2   levothyroxine (SYNTHROID) 100 MCG tablet TAKE ONE TABLET DAILY EXCEPT TAKE ONE-HALF (1/2) TABLET ON SUNDAY. SIX AND ONE-HALF TABLETS TOTAL PER WEEK 90 tablet 3   Lifitegrast (XIIDRA) 5 % SOLN      MAGNESIUM PO Take 1 tablet by mouth daily.     MELATONIN PO Take 1 tablet by mouth daily.     MULTIPLE VITAMIN PO Take 1 tablet by mouth daily.     Multiple Vitamins-Minerals (ZINC PO) Take 1 tablet by  mouth daily.     mupirocin cream (BACTROBAN) 2 % Apply 1 application topically 2 (two) times daily. 15 g 2   nitroGLYCERIN (NITRO-DUR) 0.2 mg/hr patch Place 1 patch (0.2 mg total) onto the skin daily. 30 patch 12   NON FORMULARY VSL live probiotic-one tablet by mouth daily     OVER THE COUNTER MEDICATION CBD nightly     thiamine (VITAMIN B-1) 100 MG tablet Take 100 mg by mouth daily.     TYRVAYA 0.03 MG/ACT SOLN Place 1 spray into both nostrils 2 (two) times daily.     No current facility-administered medications for this visit.    Allergies as of 10/26/2021 - Review Complete 10/26/2021  Allergen Reaction Noted   Fluticasone Other (See Comments) 03/13/2015   Mometasone Other (See Comments) 03/13/2015   Fexofenadine Palpitations 07/22/2013      Physical Exam: General:   Alert,  well-nourished, pleasant and cooperative in NAD Head:  Normocephalic and atraumatic. Eyes:  Sclera clear, no icterus.   Conjunctiva pink. Abdomen:  Soft, thin, nontender, nondistended, normal bowel sounds, no rebound or guarding. No hepatosplenomegaly. I am unable to reproduce her abdominal pain.    Extremities:  No clubbing or edema. Neurologic:  Alert and  oriented x4;  grossly nonfocal Skin:  Intact without significant lesions or rashes. Psych:  Alert and cooperative. Normal mood and affect.     Sheleen Conchas L. Tarri Glenn, MD, MPH 10/26/2021, 3:04 PM

## 2021-10-27 ENCOUNTER — Other Ambulatory Visit: Payer: Medicare Other

## 2021-10-28 ENCOUNTER — Encounter: Payer: Self-pay | Admitting: Gastroenterology

## 2021-10-31 ENCOUNTER — Encounter: Payer: Self-pay | Admitting: Gastroenterology

## 2021-10-31 ENCOUNTER — Encounter: Payer: Self-pay | Admitting: Family Medicine

## 2021-11-01 ENCOUNTER — Ambulatory Visit
Admission: RE | Admit: 2021-11-01 | Discharge: 2021-11-01 | Disposition: A | Discharge status: Home / Self Care | Payer: Medicare Other | Source: Ambulatory Visit | Attending: Gastroenterology | Admitting: Gastroenterology

## 2021-11-01 DIAGNOSIS — Q453 Other congenital malformations of pancreas and pancreatic duct: Secondary | ICD-10-CM | POA: Diagnosis present

## 2021-11-01 DIAGNOSIS — R748 Abnormal levels of other serum enzymes: Secondary | ICD-10-CM | POA: Diagnosis present

## 2021-11-01 MED ORDER — IOHEXOL 300 MG/ML  SOLN
100.0000 mL | Freq: Once | INTRAMUSCULAR | Status: AC | PRN
  Administered 2021-11-01: 75 mL via INTRAVENOUS

## 2021-11-01 NOTE — Telephone Encounter (Signed)
Terri,  Can you look into this for me and the patient.

## 2021-11-02 ENCOUNTER — Other Ambulatory Visit: Payer: Self-pay

## 2021-11-02 ENCOUNTER — Encounter: Payer: Self-pay | Admitting: Gastroenterology

## 2021-11-02 MED ORDER — LACTULOSE 10 GM/15ML PO SOLN: 10.0000 g | Freq: Once | ORAL | 0 refills | Status: AC

## 2021-11-03 ENCOUNTER — Encounter: Payer: Self-pay | Admitting: Family Medicine

## 2021-11-04 ENCOUNTER — Ambulatory Visit (HOSPITAL_COMMUNITY): Payer: Medicare Other

## 2021-11-05 LAB — PANCREATIC ELASTASE, FECAL: Pancreatic Elastase-1, Stool: >500 mcg/g

## 2021-11-08 ENCOUNTER — Encounter (INDEPENDENT_AMBULATORY_CARE_PROVIDER_SITE_OTHER): Payer: Self-pay

## 2021-11-08 ENCOUNTER — Encounter: Payer: Self-pay | Admitting: Gastroenterology

## 2021-11-08 ENCOUNTER — Telehealth: Payer: Self-pay | Admitting: Gastroenterology

## 2021-11-08 NOTE — Telephone Encounter (Signed)
Refer to patient mychart encounter.

## 2021-11-08 NOTE — Telephone Encounter (Signed)
Patient called states she would like to schedule a follow up appointment with Dr Tarri Glenn but she do not have anything until 12/26. States MD asked her to schedule an appt when she sone with her test.States she would be dead by then. Requesting a call or message through [facility]. Please call to advise.

## 2021-11-09 ENCOUNTER — Other Ambulatory Visit: Payer: Self-pay | Admitting: *Deleted

## 2021-11-09 DIAGNOSIS — F331 Major depressive disorder, recurrent, moderate: Secondary | ICD-10-CM

## 2021-11-09 MED ORDER — BUPROPION HCL ER (SR) 100 MG PO TB12: 100.0000 mg | ORAL_TABLET | Freq: Two times a day (BID) | ORAL | 1 refills | Status: DC

## 2021-11-13 ENCOUNTER — Encounter: Payer: Self-pay | Admitting: Gastroenterology

## 2021-11-16 ENCOUNTER — Telehealth: Payer: Self-pay | Admitting: Family Medicine

## 2021-11-16 NOTE — Telephone Encounter (Signed)
Pt came in office to drop off a copy of her Living Will for PCP .  It  was place  in PCP folder

## 2021-11-16 NOTE — Telephone Encounter (Signed)
Placed in Dr. Rometta Emery office in box to review/sign.

## 2021-11-17 ENCOUNTER — Ambulatory Visit (INDEPENDENT_AMBULATORY_CARE_PROVIDER_SITE_OTHER): Payer: Medicare Other | Admitting: Podiatry

## 2021-11-17 ENCOUNTER — Encounter: Payer: Self-pay | Admitting: Podiatry

## 2021-11-17 DIAGNOSIS — L603 Nail dystrophy: Secondary | ICD-10-CM | POA: Diagnosis not present

## 2021-11-17 NOTE — Progress Notes (Signed)
She presents today to take a nail sample of the third toe of the left foot.  She states that she has let it grow.  Objective: Vital signs are stable she is alert and oriented x3.  There is no erythema edema salines drainage odor nail plate third digit left foot is dry and scaly with delaminated layers of nail.  Assessment: Probable nail dystrophy cannot rule out onychomycosis.  Plan: Samples of skin and nail were taken today to be sent for pathologic evaluation I will follow-up with Jessica Miles in 1 month

## 2021-11-22 ENCOUNTER — Encounter: Payer: Self-pay | Admitting: Gastroenterology

## 2021-11-23 ENCOUNTER — Other Ambulatory Visit: Payer: Self-pay

## 2021-11-23 MED ORDER — RIFAXIMIN 550 MG PO TABS: 550.0000 mg | ORAL_TABLET | Freq: Three times a day (TID) | ORAL | 0 refills | Status: AC

## 2021-11-25 ENCOUNTER — Encounter: Payer: Self-pay | Admitting: Podiatry

## 2021-11-25 NOTE — Assessment & Plan Note (Signed)
Stable, chronic.  Continue current medication.  Wellbutrin SR 100 mg 1 tablet twice daily

## 2021-11-25 NOTE — Assessment & Plan Note (Signed)
Stable, chronic.  Continue current medication.  stable on levothyroxine 100 mcg p.o. daily

## 2021-11-25 NOTE — Assessment & Plan Note (Signed)
Chronic, stable control on tenormin prn

## 2021-11-30 ENCOUNTER — Encounter: Payer: Self-pay | Admitting: Gastroenterology

## 2021-12-01 ENCOUNTER — Telehealth: Payer: Self-pay

## 2021-12-01 NOTE — Telephone Encounter (Signed)
Thornton Park, MD  to Carl Best, RN     11/30/21  9:36 PM Please let her know that I am on vacation and do not return until next week.  She may use Imodium every day. She should not use more than 16 mg/day.  I would recommend that we work on getting her an appointment with Dr. Matilde Bash. Please process the referral.  Thank you.  KLB   Pt aware Dr. Tarri Glenn out of town and that she can use imodium. Referral needs to be made to Dr Matilde Bash.

## 2021-12-06 ENCOUNTER — Telehealth: Payer: Self-pay | Admitting: Family Medicine

## 2021-12-06 NOTE — Telephone Encounter (Signed)
Referral faxed to Dr. Matilde Bash.

## 2021-12-06 NOTE — Telephone Encounter (Signed)
Pt called asking if any phlebotomist here at [facility] could draw her blood? Pt stated she has her own kit including the packaging to return the blood draw back/ pt understands it will possibly be a out of pocket cost, which she's agreed to pay. Pt is requesting a call back @ [phone].

## 2021-12-07 ENCOUNTER — Encounter: Payer: Self-pay | Admitting: Family Medicine

## 2021-12-07 ENCOUNTER — Encounter: Payer: Self-pay | Admitting: Gastroenterology

## 2021-12-07 NOTE — Telephone Encounter (Signed)
I cannot do it myself... I would recommend her calling [facility]. OR Is it something terri could do  when she gets back or is it not something we can do given lab policy etc? If it is policy, I do not think anyone, Butch Penny included could do it at the [place].

## 2021-12-07 NOTE — Telephone Encounter (Signed)
Patient brought Kit by that she wanted to have [place] draw labs for. Informed that I am not able to draw in lab for that. Do you know if you can do in office visit?

## 2021-12-07 NOTE — Telephone Encounter (Signed)
Also see MyChart patient sent.

## 2021-12-07 NOTE — Telephone Encounter (Signed)
Patient notified via My Chart

## 2021-12-07 NOTE — Telephone Encounter (Signed)
Double checked with Caryl Pina to make sure I am right but we can't draw here if no order and not going to a lab we contract with. We have no way to chart for it or receive results. It is recommended that patient go to [facility] draw station to see if they are able to do for her. Do you mind calling patient and letting her know?

## 2021-12-15 ENCOUNTER — Other Ambulatory Visit (INDEPENDENT_AMBULATORY_CARE_PROVIDER_SITE_OTHER): Payer: Medicare Other

## 2021-12-15 ENCOUNTER — Ambulatory Visit (INDEPENDENT_AMBULATORY_CARE_PROVIDER_SITE_OTHER): Payer: Medicare Other | Admitting: Gastroenterology

## 2021-12-15 ENCOUNTER — Encounter: Payer: Self-pay | Admitting: Gastroenterology

## 2021-12-15 ENCOUNTER — Other Ambulatory Visit: Payer: Medicare Other

## 2021-12-15 ENCOUNTER — Encounter: Payer: Self-pay | Admitting: Family Medicine

## 2021-12-15 VITALS — BP 110/80 | HR 74 | Ht 66.0 in | Wt 106.5 lb

## 2021-12-15 DIAGNOSIS — R935 Abnormal findings on diagnostic imaging of other abdominal regions, including retroperitoneum: Secondary | ICD-10-CM

## 2021-12-15 DIAGNOSIS — R194 Change in bowel habit: Secondary | ICD-10-CM

## 2021-12-15 DIAGNOSIS — R140 Abdominal distension (gaseous): Secondary | ICD-10-CM

## 2021-12-15 DIAGNOSIS — R1084 Generalized abdominal pain: Secondary | ICD-10-CM

## 2021-12-15 DIAGNOSIS — K638219 Small intestinal bacterial overgrowth, unspecified: Secondary | ICD-10-CM

## 2021-12-15 LAB — COMPREHENSIVE METABOLIC PANEL
ALT: 27 U/L (ref 0–35)
AST: 28 U/L (ref 0–37)
Albumin: 4.5 g/dL (ref 3.5–5.2)
Alkaline Phosphatase: 96 U/L (ref 39–117)
BUN: 22 mg/dL (ref 6–23)
CO2: 30 mEq/L (ref 19–32)
Calcium: 9.6 mg/dL (ref 8.4–10.5)
Chloride: 103 mEq/L (ref 96–112)
Creatinine, Ser: 1.05 mg/dL (ref 0.40–1.20)
GFR: 51.12 mL/min — ABNORMAL LOW (ref >60.00)
Glucose, Bld: 84 mg/dL (ref 70–99)
Potassium: 4.3 mEq/L (ref 3.5–5.1)
Sodium: 138 mEq/L (ref 135–145)
Total Bilirubin: 0.5 mg/dL (ref 0.2–1.2)
Total Protein: 7.2 g/dL (ref 6.0–8.3)

## 2021-12-15 LAB — LIPASE: Lipase: 66 U/L — ABNORMAL HIGH (ref 11.0–59.0)

## 2021-12-15 NOTE — Telephone Encounter (Signed)
Confirmed with Dr. Dustin Flock office that they did receive referral & have been trying to reach patient since 12/07/21. They are going to reach out again today.

## 2021-12-15 NOTE — Progress Notes (Signed)
Referring Provider: Jinny Sanders, MD Primary Care Physician:  Jinny Sanders, MD  Chief Complaint: Gas, altered bowel habits, abdominal pain   IMPRESSION:  Prominent pancreatic duct without pancreatic parenchyma abnormality. Slight prominence of the pancreatic duct on MRI/MRCP and follow-up CT of the pancreas. Lipase is slightly elevated as is the ALT.  Unclear if this is related to recent symptoms but, must consider ampullary lesion. Will review with our advanced biliary team to see if she could have endoscopic ultrasound and side-viewing endoscopy to ensure no evidence of a ampullary lesion locally or if we need to refer to a tertiary care center.  Constipation-IBS previously controlled with FODMAP diet. Symptoms recently changed and she is now having more diarrhea.  Breath-test negative for SIBO.  Repeat trial of Xifaxan exacerbated her symptoms. She has recently completed multiple online education program surrounding IBS including hypnotherapy.  We discussed possibly participating in consultation with a online nutritionist who specializes in GI etiologies.  We also briefly discussed getting a second opinion with Dr. Matilde Bash.  Symptomatic diverticulosis +/- SIBO given her presentation of left lower quadrant pain with altered bowel habits different from her chronic constipation IBS symptoms. Some clinical improvement with empiric antibiotics for SIBO x 2. But, now with recurrent and slightly different symptoms. Breath test to negative for SIBO.   Abnormal transaminases.  ALT normal in 2022 but have been consistently elevated this year. Liver has appeared normal on multiple cross-sectional imaging studies.   PLAN: - lipase, CMP today - stool for pancreatic elastase - Abdominal ultrasound followed by HIDA with CCK if negative to exclude symptomatic gallbladder disease - EGD with EUS with Dr. Rush Landmark versus referral to academic center (will review with Dr. Rush Landmark) - Consider  nutrition consult in an effort to minimize dietary restrictions - Consider virtual consultation with GI-focused dietician/nutritionist - We briefly discussed getting a second opinion with Dr. Matilde Bash    Please see the "Patient Instructions" section for addition details about the plan.   HPI: Jessica Miles is a 76 y.o. female who returns in follow-up for altered bowel habits. She has a longstanding history of constipation-IBS diagnosed in 2015 and treated with the FODMAP diet, diverticulosis, and hemorrhoids treated with Analpram cream. Testing for celiac was negative. She was previously followed by Dr. Vira Agar at the [facility].    She had Covid 19 in September with severe symptoms including significant weight loss down to 101 pounds. She had to stop FODMAP while recovering from Covid. In mid-November she developed a sharp, LLQ abdominal pain. She was very aware of stool or gas moving through her intestines. She has had ongoing problems with change in bowel habits, gas, and abdominal pain since that time.   A CT of the abdomen and pelvis 12/16/2019 showed abnormal wall thickening in the distal transverse colon, descending colon, sigmoid colon, and parts of the ascending colon with focal wall thickening in the ascending colon worrisome for the possibility of malignancy.  A prominent pancreatic duct which appears slightly more prominent than the 2012 exam was also noted.  Resumed FODMAP diet, increasing water, and a probiotic (VSL x 3 months). Took amoxicillin and the CT prep but she developed severe diarrhea with mushy, malodorous stools, rectal pain and burning, and a hemorrhoid flare. She was concerned that she wasn't absorbing anything.    She took a 7 day course of prednisone that completely resolved her symptoms.   At the time of her initial consultation 02/05/20 her stool had returned to  normal. She regained the 10 pounds that she lost during her period of acute symptoms.   02/06/20  negative GI pathogen panel, fecal calprotectin 34  Colonoscopy 03/06/20 showed sigmoid diverticulosis, tortuous colon and small internal hemorrhoids.  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 No evidence of pancreatic mass or other obstructing etiology. Gallbladder sludge, without evidence of cholecystitis or biliary ductal dilatation.  Seen in the office 03/10/20 for symptomatic diverticulosis +/- SIBO given symptoms of left lower quadrant pain with altered bowel habits different from her chronic constipation IBS symptoms.  That time she is continued on a FODMAP diet and VSL probiotics were discussed.  She was given a trial of Xifaxan 550 mg 3 times daily x2 weeks and told to add Citrucel.     Urgent office visit 04/2021 with Ellouise Newer for diarrhea. C diff negative.  Seen by Earnie Larsson. GI pathogen panel negative. Xifaxan x 14 days resulted in significant symptom improvement.  Trial of dicyclomine.  She switched her diet from low FODMAP to SIBO diet after reading about it online.   At time of office visit in October 2023 she reported developing severe fatigue with associated left-sided abdominal pain. She felt like the smell of her gas worsened.  At that time, liver enzymes were elevated. They have continued to increase on retesting in 2 months.  She has a stools that are 3-5 Bristol scale most days. However, she will have days without any bowel movement and sometimes even more frequently. There is significant urgency. Texture of the stool has changed. Appetite is good. Weight is overall stable, although she'd like to regain more.   Her transaminases were found to be elevated in August.  AST 39 and ALT 38.  On repeat testing earlier this month AST had increased to 50 and ALT to 54.  Total bilirubin was normal.  Alk phos is slightly elevated at 121.  Other labs in August showed normal iron and ferritin and a normal CBC.  In October, stool negative  for pancreatic elastase.  Lipase mildly elevated at 81. ALT had improved to 41 but was still slightly elevated.   CT abd/pelvis with contrast 11/02/21 showed stable and nonspecific pancreatic duct dilation. The pancreas otherwise appears normal. No acute intra-abdominal or intrapelvic process identified.   Patient submitted TrioSmart Breath test on her own. Results were negative for SIBO. But, she developed severe abdominal pain and diarrhea after drinking the lactulose for the test. At her request, another round of Xifaxan 550 mg TID x 14 days was prescribed. However, symptoms escalated and she felt they may be due to side effects of the Xifaxan.   Returns today. She is frustrated because symptoms now started 2 years ago this month. She has stopped Citrucel and peppermint oil. She is frustrated by post-prandial diarrhea, gas, and eructation. The symptoms are so bad that she had to cancel Thanksgiving. Has tried to submit testing for IBS-Smart through [employer]. She is also considering medical marijuana.    Endoscopic history: - Colonoscopy in 2012 for abdominal pain and again in 2019 at the [facility] clinic with Dr. Vira Agar.  It showed a "tortuous colon" and hemorrhoids. A pediatric colonoscope was used. Unfortunately, Dr. Percell Boston colonoscopy report is not available in [place], but, Dr. Diona Browner summarized it in a clinic note.  - Colonoscopy 03/06/20 showed sigmoid diverticulosis, tortuous colon and small internal hemorrhoids.    Past Medical History:  Diagnosis Date   Allergy    Anxiety  COVID-19 virus infection    09-23-2019- 9-16 had monoclonal infusion    Depression    History of IBS    HPV (human papilloma virus) infection    Hyperlipidemia    82- not taking statin    Loss of taste    Neuropathy 2015   surgery for Mortons neuroma in 1985   Osteopenia    Raynaud's disease 02/2020   Tachycardia    Thyroid disease     Past Surgical History:  Procedure Laterality Date    COLONOSCOPY  2019   hems only    [place] EXTRACTION      Current Outpatient Medications  Medication Sig Dispense Refill   ascorbic acid (VITAMIN C) 500 MG tablet Take 500 mg by mouth 2 (two) times daily.      atenolol (TENORMIN) 25 MG tablet Take 25 mg by mouth daily as needed.     buPROPion ER (WELLBUTRIN SR) 100 MG 12 hr tablet Take 1 tablet (100 mg total) by mouth 2 (two) times daily. 180 tablet 1   Calcium Carb-Cholecalciferol (CALCIUM-VITAMIN D) 600-400 MG-UNIT TABS Take 1 tablet by mouth daily.     DTx App - Gastrointestinal (REGULORA) MISC 1 application by Does not apply route daily. Pt cell [phone] 1 each 0   hydrocortisone-pramoxine (ANALPRAM-HC) 2.5-1 % rectal cream Place 1 application rectally as needed for hemorrhoids or anal itching. 30 g 1   ketoconazole (NIZORAL) 2 % cream Apply between the third and fourth toe twice a day 60 g 2   levothyroxine (SYNTHROID) 100 MCG tablet TAKE ONE TABLET DAILY EXCEPT TAKE ONE-HALF (1/2) TABLET ON SUNDAY. SIX AND ONE-HALF TABLETS TOTAL PER WEEK 90 tablet 3   Lifitegrast (XIIDRA) 5 % SOLN      MAGNESIUM PO Take 1 tablet by mouth daily.     MELATONIN PO Take 1 tablet by mouth daily.     MULTIPLE VITAMIN PO Take 1 tablet by mouth daily.     Multiple Vitamins-Minerals (ZINC PO) Take 1 tablet by mouth daily.     mupirocin cream (BACTROBAN) 2 % Apply 1 application topically 2 (two) times daily. 15 g 2   nitroGLYCERIN (NITRO-DUR) 0.2 mg/hr patch Place 1 patch (0.2 mg total) onto the skin daily. 30 patch 12   NON FORMULARY VSL live probiotic-one tablet by mouth daily     OVER THE COUNTER MEDICATION CBD nightly     thiamine (VITAMIN B-1) 100 MG tablet Take 100 mg by mouth daily.     TYRVAYA 0.03 MG/ACT SOLN Place 1 spray into both nostrils 2 (two) times daily.     No current facility-administered medications for this visit.    Allergies as of 12/15/2021 - Review Complete 12/15/2021  Allergen Reaction Noted    Fluticasone Other (See Comments) 03/13/2015   Mometasone Other (See Comments) 03/13/2015   Fexofenadine Palpitations 07/22/2013      Physical Exam: General:   Alert,  well-nourished, pleasant and cooperative in NAD Head:  Normocephalic and atraumatic. Eyes:  Sclera clear, no icterus.   Conjunctiva pink. Abdomen:  Soft, thin, nontender, nondistended, normal bowel sounds, no rebound or guarding. No hepatosplenomegaly. I am unable to reproduce her abdominal pain.    Extremities:  No clubbing or edema. Neurologic:  Alert and  oriented x4;  grossly nonfocal Skin:  Intact without significant lesions or rashes. Psych:  Alert and cooperative. Normal mood and affect.     Jessica Miles L. Tarri Glenn, MD, MPH 12/15/2021, 1:29 PM

## 2021-12-15 NOTE — Patient Instructions (Signed)
At this point, I think we need to make sure that your symptoms are something other than IBS. We discussed testing of both your gallbladder and pancreas. If these are negative, Dr. Mathews Robinsons would be the next best step as we try to sort this out.    Please forward the results of your IBS when they are available.   Your provider has requested that you go to the basement level for lab work before leaving today. Press "B" on the elevator. The lab is located at the first door on the left as you exit the elevator.  You have been scheduled for an abdominal ultrasound at [facility]iology (1st floor of hospital) on Friday 12/17/21 at 7:00 am. Please arrive 30 minutes prior to your appointment for registration. Make certain not to have anything to eat or drink 6 hours prior to your appointment. Should you need to reschedule your appointment, please contact radiology at [phone]. This test typically takes about 30 minutes to perform.

## 2021-12-16 ENCOUNTER — Ambulatory Visit
Admission: RE | Admit: 2021-12-16 | Discharge: 2021-12-16 | Disposition: A | Discharge status: Home / Self Care | Payer: Medicare Other | Source: Ambulatory Visit | Attending: Gastroenterology | Admitting: Gastroenterology

## 2021-12-16 DIAGNOSIS — R935 Abnormal findings on diagnostic imaging of other abdominal regions, including retroperitoneum: Secondary | ICD-10-CM | POA: Diagnosis present

## 2021-12-16 DIAGNOSIS — R1084 Generalized abdominal pain: Secondary | ICD-10-CM | POA: Diagnosis present

## 2021-12-16 DIAGNOSIS — R140 Abdominal distension (gaseous): Secondary | ICD-10-CM | POA: Diagnosis present

## 2021-12-17 ENCOUNTER — Ambulatory Visit (HOSPITAL_COMMUNITY): Payer: Medicare Other

## 2021-12-17 NOTE — Telephone Encounter (Signed)
Mesentery is not in the office, can you help me resubmit this GI pathogen panel with different diagnosis than acute diarrhea.  I would like to resubmit it with a diagnosis of SIBO Dx : X48.0165   Can you help or who do I ask?   See pt Mychart notes.

## 2021-12-21 ENCOUNTER — Encounter: Payer: Self-pay | Admitting: Gastroenterology

## 2021-12-21 ENCOUNTER — Telehealth: Payer: Self-pay

## 2021-12-21 ENCOUNTER — Other Ambulatory Visit: Payer: Self-pay

## 2021-12-21 DIAGNOSIS — Q453 Other congenital malformations of pancreas and pancreatic duct: Secondary | ICD-10-CM

## 2021-12-21 DIAGNOSIS — R748 Abnormal levels of other serum enzymes: Secondary | ICD-10-CM

## 2021-12-21 DIAGNOSIS — R1084 Generalized abdominal pain: Secondary | ICD-10-CM

## 2021-12-21 LAB — PANCREATIC ELASTASE, FECAL: Pancreatic Elastase-1, Stool: 500 mcg/g

## 2021-12-21 NOTE — Telephone Encounter (Signed)
EGD EUS linear 01/25/22 at 9 am at [facility] with GM   Left message on machine to call back

## 2021-12-21 NOTE — Telephone Encounter (Signed)
-----   Message from Irving Copas., MD sent at 12/21/2021  4:54 AM EST ----- KB, Happy to be available.  Ellarie Picking, Please schedule patient for EGD/EUS linear for pancreatic duct dilation evaluation and abnormal lipase. Thanks. GM ----- Message ----- From: Thornton Park, MD Sent: 12/20/2021  12:39 PM EST To: Irving Copas., MD  Gabe, This patient has pain, bloating, and diarrhea. She was convinced it was IBS, but, evaluation has shown a persistently elevated lipase and prominent pancreatic duct without pancreatic parenchyma abnormality on serial imaging. Slight prominence of the pancreatic duct on MRI/MRCP and follow-up CT of the pancreas. Unclear if this is related to recent symptoms but, must consider ampullary lesion. Would you consider an EGD with endoscopic ultrasound and side-viewing endoscopy to ensure no evidence of a ampullary lesion locally or if we need to refer to a tertiary care center? Thanks. ----- Message ----- From: Interface, Lab In Three Zero One Sent: 12/15/2021  11:19 AM EST To: Thornton Park, MD

## 2021-12-21 NOTE — Telephone Encounter (Signed)
Refer to imaging result note 12/16/21.

## 2021-12-22 ENCOUNTER — Encounter: Payer: Self-pay | Admitting: Podiatry

## 2021-12-22 ENCOUNTER — Ambulatory Visit (INDEPENDENT_AMBULATORY_CARE_PROVIDER_SITE_OTHER): Payer: Medicare Other | Admitting: Podiatry

## 2021-12-22 ENCOUNTER — Encounter: Payer: Self-pay | Admitting: Gastroenterology

## 2021-12-22 DIAGNOSIS — L603 Nail dystrophy: Secondary | ICD-10-CM

## 2021-12-22 NOTE — Telephone Encounter (Signed)
EUS scheduled, pt instructed and medications reviewed.  Patient instructions mailed to home and My Chart .  Patient to call with any questions or concerns.

## 2021-12-22 NOTE — Progress Notes (Signed)
She presents today for follow-up of her nail pathology.  Objective: Vital signs are stable she alert and oriented x 3 pathology report does demonstrate nail dystrophy and onychomycosis with Trichophyton rubrum.  Assessment: Onychomycosis.  Plan: Discussed etiology pathology conservative surgical therapies at this point I saw where she is having pancreatic and biliary issues.  She is having studies done coming up in the near future I recommended no oral medication at this time.  We did discuss topical versus laser therapy.  She would like to get started with laser and I will follow-up with her once that is complete.

## 2021-12-22 NOTE — Telephone Encounter (Signed)
Left message on machine to call back  

## 2021-12-27 ENCOUNTER — Ambulatory Visit
Admission: RE | Admit: 2021-12-27 | Discharge: 2021-12-27 | Disposition: A | Discharge status: Home / Self Care | Payer: Medicare Other | Source: Ambulatory Visit | Attending: Gastroenterology | Admitting: Gastroenterology

## 2021-12-27 DIAGNOSIS — R1084 Generalized abdominal pain: Secondary | ICD-10-CM | POA: Insufficient documentation

## 2021-12-27 MED ORDER — TECHNETIUM TC 99M MEBROFENIN IV KIT
5.0000 | PACK | Freq: Once | INTRAVENOUS | Status: AC | PRN
  Administered 2021-12-27: 5.4 via INTRAVENOUS

## 2021-12-29 ENCOUNTER — Encounter: Payer: Self-pay | Admitting: Gastroenterology

## 2022-01-06 ENCOUNTER — Other Ambulatory Visit: Payer: Self-pay

## 2022-01-06 ENCOUNTER — Other Ambulatory Visit: Payer: Self-pay | Admitting: Gastroenterology

## 2022-01-06 MED ORDER — COLESTIPOL HCL 1 G PO TABS: ORAL_TABLET | ORAL | 0 refills | Status: DC

## 2022-01-10 ENCOUNTER — Encounter: Payer: Self-pay | Admitting: Podiatry

## 2022-01-14 ENCOUNTER — Other Ambulatory Visit: Payer: Medicare Other

## 2022-01-14 ENCOUNTER — Encounter: Payer: Self-pay | Admitting: Family Medicine

## 2022-01-16 ENCOUNTER — Encounter: Payer: Self-pay | Admitting: Gastroenterology

## 2022-01-17 NOTE — Telephone Encounter (Signed)
Previous mychart message sent from patient has been forwarded to Dr. Tarri Glenn.

## 2022-01-18 ENCOUNTER — Other Ambulatory Visit: Payer: Self-pay

## 2022-01-18 ENCOUNTER — Encounter: Payer: Self-pay | Admitting: Gastroenterology

## 2022-01-18 DIAGNOSIS — R194 Change in bowel habit: Secondary | ICD-10-CM

## 2022-01-18 DIAGNOSIS — R143 Flatulence: Secondary | ICD-10-CM

## 2022-01-18 DIAGNOSIS — Q453 Other congenital malformations of pancreas and pancreatic duct: Secondary | ICD-10-CM

## 2022-01-18 DIAGNOSIS — R140 Abdominal distension (gaseous): Secondary | ICD-10-CM

## 2022-01-18 DIAGNOSIS — R110 Nausea: Secondary | ICD-10-CM

## 2022-01-18 DIAGNOSIS — R197 Diarrhea, unspecified: Secondary | ICD-10-CM

## 2022-01-19 ENCOUNTER — Encounter (HOSPITAL_COMMUNITY): Payer: Self-pay | Admitting: Gastroenterology

## 2022-01-19 ENCOUNTER — Other Ambulatory Visit (INDEPENDENT_AMBULATORY_CARE_PROVIDER_SITE_OTHER): Payer: Medicare Other

## 2022-01-19 ENCOUNTER — Encounter: Payer: Self-pay | Admitting: Family Medicine

## 2022-01-19 DIAGNOSIS — Q453 Other congenital malformations of pancreas and pancreatic duct: Secondary | ICD-10-CM | POA: Diagnosis not present

## 2022-01-19 DIAGNOSIS — R143 Flatulence: Secondary | ICD-10-CM

## 2022-01-19 DIAGNOSIS — R194 Change in bowel habit: Secondary | ICD-10-CM | POA: Diagnosis not present

## 2022-01-19 DIAGNOSIS — R197 Diarrhea, unspecified: Secondary | ICD-10-CM

## 2022-01-19 DIAGNOSIS — R140 Abdominal distension (gaseous): Secondary | ICD-10-CM | POA: Diagnosis not present

## 2022-01-19 DIAGNOSIS — R110 Nausea: Secondary | ICD-10-CM

## 2022-01-19 LAB — COMPREHENSIVE METABOLIC PANEL
ALT: 17 U/L (ref 0–35)
AST: 22 U/L (ref 0–37)
Albumin: 4.6 g/dL (ref 3.5–5.2)
Alkaline Phosphatase: 92 U/L (ref 39–117)
BUN: 16 mg/dL (ref 6–23)
CO2: 23 mEq/L (ref 19–32)
Calcium: 9.7 mg/dL (ref 8.4–10.5)
Chloride: 102 mEq/L (ref 96–112)
Creatinine, Ser: 1.02 mg/dL (ref 0.40–1.20)
GFR: 52.89 mL/min — ABNORMAL LOW (ref >60.00)
Glucose, Bld: 98 mg/dL (ref 70–99)
Potassium: 4.1 mEq/L (ref 3.5–5.1)
Sodium: 140 mEq/L (ref 135–145)
Total Bilirubin: 0.5 mg/dL (ref 0.2–1.2)
Total Protein: 7.6 g/dL (ref 6.0–8.3)

## 2022-01-19 LAB — CBC WITH DIFFERENTIAL/PLATELET
Basophils Absolute: 0 10*3/uL (ref 0.0–0.1)
Basophils Relative: 0.8 % (ref 0.0–3.0)
Eosinophils Absolute: 0 10*3/uL (ref 0.0–0.7)
Eosinophils Relative: 0.4 % (ref 0.0–5.0)
HCT: 42.7 % (ref 36.0–46.0)
Hemoglobin: 14.6 g/dL (ref 12.0–15.0)
Lymphocytes Relative: 18.4 % (ref 12.0–46.0)
Lymphs Abs: 1 10*3/uL (ref 0.7–4.0)
MCHC: 34.2 g/dL (ref 30.0–36.0)
MCV: 90.3 fl (ref 78.0–100.0)
Monocytes Absolute: 0.3 10*3/uL (ref 0.1–1.0)
Monocytes Relative: 5 % (ref 3.0–12.0)
Neutro Abs: 4 10*3/uL (ref 1.4–7.7)
Neutrophils Relative %: 75.4 % (ref 43.0–77.0)
Platelets: 298 10*3/uL (ref 150.0–400.0)
RBC: 4.73 Mil/uL (ref 3.87–5.11)
RDW: 12.7 % (ref 11.5–15.5)
WBC: 5.3 10*3/uL (ref 4.0–10.5)

## 2022-01-19 LAB — AMYLASE: Amylase: 88 U/L (ref 27–131)

## 2022-01-19 LAB — LIPASE: Lipase: 97 U/L — ABNORMAL HIGH (ref 11.0–59.0)

## 2022-01-19 MED ORDER — HYDROCORT-PRAMOXINE (PERIANAL) 2.5-1 % EX CREA: 1.0000 | TOPICAL_CREAM | CUTANEOUS | 1 refills | Status: DC | PRN

## 2022-01-19 NOTE — Telephone Encounter (Signed)
Refer to alternate [place] [facility] encounters.

## 2022-01-20 ENCOUNTER — Telehealth: Payer: Self-pay

## 2022-01-20 NOTE — Telephone Encounter (Signed)
Patient called in stating she submitted her stool kit today at [place] that they are missing the order number. Called to speak with lab at their office to see if this number could be provided and they stated they don't typically do orders from outside clinics & that patient should submit sample with our office. Patient has been advised to drop off a new kit with our office at her earliest convenience. Pt verbalized al understating.

## 2022-01-21 ENCOUNTER — Other Ambulatory Visit: Payer: Medicare Other

## 2022-01-21 DIAGNOSIS — R143 Flatulence: Secondary | ICD-10-CM

## 2022-01-21 DIAGNOSIS — R110 Nausea: Secondary | ICD-10-CM

## 2022-01-21 DIAGNOSIS — Q453 Other congenital malformations of pancreas and pancreatic duct: Secondary | ICD-10-CM

## 2022-01-21 DIAGNOSIS — R140 Abdominal distension (gaseous): Secondary | ICD-10-CM

## 2022-01-21 DIAGNOSIS — R197 Diarrhea, unspecified: Secondary | ICD-10-CM

## 2022-01-21 DIAGNOSIS — R194 Change in bowel habit: Secondary | ICD-10-CM

## 2022-01-22 ENCOUNTER — Encounter: Payer: Self-pay | Admitting: Gastroenterology

## 2022-01-22 ENCOUNTER — Encounter: Payer: Self-pay | Admitting: Family Medicine

## 2022-01-23 ENCOUNTER — Encounter: Payer: Self-pay | Admitting: Gastroenterology

## 2022-01-25 ENCOUNTER — Ambulatory Visit (HOSPITAL_COMMUNITY): Payer: Medicare Other | Admitting: Registered Nurse

## 2022-01-25 ENCOUNTER — Encounter (HOSPITAL_COMMUNITY)
Admission: RE | Disposition: A | Discharge status: Home / Self Care | Payer: Self-pay | Source: Home / Self Care | Attending: Gastroenterology

## 2022-01-25 ENCOUNTER — Encounter (HOSPITAL_COMMUNITY): Payer: Self-pay | Admitting: Gastroenterology

## 2022-01-25 ENCOUNTER — Ambulatory Visit (HOSPITAL_COMMUNITY)
Admission: RE | Admit: 2022-01-25 | Discharge: 2022-01-25 | Disposition: A | Discharge status: Home / Self Care | Payer: Medicare Other | Attending: Gastroenterology | Admitting: Gastroenterology

## 2022-01-25 ENCOUNTER — Ambulatory Visit (HOSPITAL_BASED_OUTPATIENT_CLINIC_OR_DEPARTMENT_OTHER): Payer: Medicare Other | Admitting: Registered Nurse

## 2022-01-25 ENCOUNTER — Encounter: Payer: Self-pay | Admitting: Gastroenterology

## 2022-01-25 ENCOUNTER — Other Ambulatory Visit: Payer: Self-pay

## 2022-01-25 DIAGNOSIS — M1990 Unspecified osteoarthritis, unspecified site: Secondary | ICD-10-CM | POA: Insufficient documentation

## 2022-01-25 DIAGNOSIS — K449 Diaphragmatic hernia without obstruction or gangrene: Secondary | ICD-10-CM | POA: Insufficient documentation

## 2022-01-25 DIAGNOSIS — K5710 Diverticulosis of small intestine without perforation or abscess without bleeding: Secondary | ICD-10-CM | POA: Diagnosis not present

## 2022-01-25 DIAGNOSIS — K8689 Other specified diseases of pancreas: Secondary | ICD-10-CM

## 2022-01-25 DIAGNOSIS — K3189 Other diseases of stomach and duodenum: Secondary | ICD-10-CM | POA: Diagnosis not present

## 2022-01-25 DIAGNOSIS — E039 Hypothyroidism, unspecified: Secondary | ICD-10-CM

## 2022-01-25 DIAGNOSIS — K2289 Other specified disease of esophagus: Secondary | ICD-10-CM

## 2022-01-25 DIAGNOSIS — F418 Other specified anxiety disorders: Secondary | ICD-10-CM

## 2022-01-25 DIAGNOSIS — Q453 Other congenital malformations of pancreas and pancreatic duct: Secondary | ICD-10-CM

## 2022-01-25 DIAGNOSIS — R748 Abnormal levels of other serum enzymes: Secondary | ICD-10-CM

## 2022-01-25 DIAGNOSIS — R1013 Epigastric pain: Secondary | ICD-10-CM | POA: Diagnosis present

## 2022-01-25 HISTORY — PX: EUS: SHX5427

## 2022-01-25 HISTORY — PX: ESOPHAGOGASTRODUODENOSCOPY (EGD) WITH PROPOFOL: SHX5813

## 2022-01-25 HISTORY — PX: BIOPSY: SHX5522

## 2022-01-25 SURGERY — ESOPHAGOGASTRODUODENOSCOPY (EGD) WITH PROPOFOL
Anesthesia: Monitor Anesthesia Care

## 2022-01-25 MED ORDER — PROPOFOL 500 MG/50ML IV EMUL
INTRAVENOUS | Status: DC | PRN
  Administered 2022-01-25: 120 ug/kg/min via INTRAVENOUS
  Administered 2022-01-25: 10 mg via INTRAVENOUS

## 2022-01-25 MED ORDER — SODIUM CHLORIDE 0.9 % IV SOLN: INTRAVENOUS | Status: DC

## 2022-01-25 MED ORDER — LACTATED RINGERS IV SOLN
INTRAVENOUS | Status: DC
  Administered 2022-01-25: 1000 mL via INTRAVENOUS

## 2022-01-25 MED ORDER — ESOMEPRAZOLE MAGNESIUM 40 MG PO CPDR: 40.0000 mg | DELAYED_RELEASE_CAPSULE | Freq: Every day | ORAL | 1 refills | Status: DC

## 2022-01-25 SURGICAL SUPPLY — 15 items

## 2022-01-25 NOTE — Anesthesia Procedure Notes (Signed)
Procedure Name: MAC Date/Time: 01/25/2022 9:21 AM  Performed by: Victoriano Lain, CRNAPre-anesthesia Checklist: Patient identified, Emergency Drugs available, Suction available, Patient being monitored and Timeout performed Patient Re-evaluated:Patient Re-evaluated prior to induction Oxygen Delivery Method: Simple face mask Dental Injury: Teeth and Oropharynx as per pre-operative assessment

## 2022-01-25 NOTE — Discharge Instructions (Signed)
YOU HAD AN ENDOSCOPIC PROCEDURE TODAY: Refer to the procedure report and other information in the discharge instructions given to you for any specific questions about what was found during the examination. If this information does not answer your questions, please call [place] office at [phone] to clarify.  ° °YOU SHOULD EXPECT: Some feelings of bloating in the abdomen. Passage of more gas than usual. Walking can help get rid of the air that was put into your GI tract during the procedure and reduce the bloating. If you had a lower endoscopy (such as a colonoscopy or flexible sigmoidoscopy) you may notice spotting of blood in your stool or on the toilet paper. Some abdominal soreness may be present for a day or two, also. ° °DIET: Your first meal following the procedure should be a light meal and then it is ok to progress to your normal diet. A half-sandwich or bowl of soup is an example of a good first meal. Heavy or fried foods are harder to digest and may make you feel nauseous or bloated. Drink plenty of fluids but you should avoid alcoholic beverages for 24 hours. If you had a esophageal dilation, please see attached instructions for diet.   ° °ACTIVITY: Your care partner should take you home directly after the procedure. You should plan to take it easy, moving slowly for the rest of the day. You can resume normal activity the day after the procedure however YOU SHOULD NOT DRIVE, use power tools, machinery or perform tasks that involve climbing or major physical exertion for 24 hours (because of the sedation medicines used during the test).  ° °SYMPTOMS TO REPORT IMMEDIATELY: °A gastroenterologist can be reached at any hour. Please call [phone]  for any of the following symptoms:  °Following lower endoscopy (colonoscopy, flexible sigmoidoscopy) °Excessive amounts of blood in the stool  °Significant tenderness, worsening of abdominal pains  °Swelling of the abdomen that is new, acute  °Fever of 100° or  higher  °Following upper endoscopy (EGD, EUS, ERCP, esophageal dilation) °Vomiting of blood or coffee ground material  °New, significant abdominal pain  °New, significant chest pain or pain under the shoulder blades  °Painful or persistently difficult swallowing  °New shortness of breath  °Black, tarry-looking or red, bloody stools ° °FOLLOW UP:  °If any biopsies were taken you will be contacted by phone or by letter within the next 1-3 weeks. Call [phone]  if you have not heard about the biopsies in 3 weeks.  °Please also call with any specific questions about appointments or follow up tests. ° °

## 2022-01-25 NOTE — H&P (Signed)
GASTROENTEROLOGY PROCEDURE H&P NOTE   Primary Care Physician: Jinny Sanders, MD  HPI: Jessica Miles is a 77 y.o. female who presents for EGD/EUS to evaluate a prominent pancreatic duct, elevated lipase and rule out ampullary processes.  Past Medical History:  Diagnosis Date   Allergy    Anxiety    COVID-19 virus infection    09-23-2019- 9-16 had monoclonal infusion    Depression    History of IBS    HPV (human papilloma virus) infection    Hyperlipidemia    82- not taking statin    Loss of taste    Neuropathy 2015   surgery for Mortons neuroma in 1985   Osteopenia    Raynaud's disease 02/2020   Tachycardia    Thyroid disease    Past Surgical History:  Procedure Laterality Date   COLONOSCOPY  2019   hems only    [place] EXTRACTION     No current facility-administered medications for this encounter.   No current facility-administered medications for this encounter. Allergies  Allergen Reactions   Fluticasone Other (See Comments)    Nose bleeds   Mometasone Other (See Comments)    Epistaxis   Fexofenadine Palpitations   Family History  Problem Relation Age of Onset   Stroke Mother 66   Heart failure Mother    Heart attack Mother    Heart disease Mother    Heart attack Father 58   Heart disease Father    Heart disease Brother    Heart failure Brother        CHF   Heart disease Maternal Grandmother    Heart attack Maternal Grandfather    Diabetes Son        IDDM   Stroke Paternal Uncle    Thyroid disease Neg Hx    Stomach cancer Neg Hx    Esophageal cancer Neg Hx    Colon polyps Neg Hx    Colon cancer Neg Hx    Rectal cancer Neg Hx    Social History   Socioeconomic History   Marital status: Married    Spouse name: Juanda Crumble B. Corigliano   Number of children: 2   Years of education: MA   Highest education level: Not on file  Occupational History   Occupation: Retired    Comment: Music therapist  Tobacco Use    Smoking status: Never   Smokeless tobacco: Never  Vaping Use   Vaping Use: Never used  Substance and Sexual Activity   Alcohol use: Not Currently   Drug use: No   Sexual activity: Not Currently  Other Topics Concern   Not on file  Social History Narrative   12/23/20 Patient lives at [facility] with spouse.   Caffeine Use: 1 cup daily   2 kids- 4 grandchildren- two boys and two girls      Social Determinants of Health   Financial Resource Strain: Low Risk  (10/21/2021)   Overall Financial Resource Strain (CARDIA)    Difficulty of Paying Living Expenses: Not hard at all  Food Insecurity: No Food Insecurity (10/21/2021)   Hunger Vital Sign    Worried About Running Out of Food in the Last Year: Never true    Ran Out of Food in the Last Year: Never true  Transportation Needs: No Transportation Needs (10/21/2021)   PRAPARE - Hydrologist (Medical): No    Lack of Transportation (Non-Medical): No  Physical Activity:  Sufficiently Active (10/21/2021)   Exercise Vital Sign    Days of Exercise per Week: 7 days    Minutes of Exercise per Session: 120 min  Stress: No Stress Concern Present (10/21/2021)   [place]    Feeling of Stress : Not at all  Social Connections: Moderately Isolated (10/21/2021)   Social Connection and Isolation Panel [NHANES]    Frequency of Communication with Friends and Family: More than three times a week    Frequency of Social Gatherings with Friends and Family: More than three times a week    Attends Religious Services: Never    Marine scientist or Organizations: No    Attends Archivist Meetings: Never    Marital Status: Married  Human resources officer Violence: Not At Risk (10/21/2021)   Humiliation, Afraid, Rape, and Kick questionnaire    Fear of Current or Ex-Partner: No    Emotionally Abused: No    Physically Abused: No    Sexually Abused: No     Physical Exam: There were no vitals filed for this visit. There is no height or weight on file to calculate BMI. GEN: NAD EYE: Sclerae anicteric ENT: MMM CV: Non-tachycardic GI: Soft, NT/ND NEURO:  Alert & Oriented x 3  Lab Results: No results for input(s): "WBC", "HGB", "HCT", "PLT" in the last 72 hours. BMET No results for input(s): "NA", "K", "CL", "CO2", "GLUCOSE", "BUN", "CREATININE", "CALCIUM" in the last 72 hours. LFT No results for input(s): "PROT", "ALBUMIN", "AST", "ALT", "ALKPHOS", "BILITOT", "BILIDIR", "IBILI" in the last 72 hours. PT/INR No results for input(s): "LABPROT", "INR" in the last 72 hours.   Impression / Plan: This is a 77 y.o.female who presents for EGD/EUS to evaluate a prominent pancreatic duct, elevated lipase and rule out ampullary processes.  The risks of an EUS including intestinal perforation, bleeding, infection, aspiration, and medication effects were discussed as was the possibility it may not give a definitive diagnosis if a biopsy is performed.  When a biopsy of the pancreas is done as part of the EUS, there is an additional risk of pancreatitis at the rate of about 1-2%.  It was explained that procedure related pancreatitis is typically mild, although it can be severe and even life threatening, which is why we do not perform random pancreatic biopsies and only biopsy a lesion/area we feel is concerning enough to warrant the risk.  The risks and benefits of endoscopic evaluation/treatment were discussed with the patient and/or family; these include but are not limited to the risk of perforation, infection, bleeding, missed lesions, lack of diagnosis, severe illness requiring hospitalization, as well as anesthesia and sedation related illnesses.  The patient's history has been reviewed, patient examined, no change in status, and deemed stable for procedure.  The patient and/or family is agreeable to proceed.    Justice Britain, MD [place]  Gastroenterology Advanced Endoscopy Office # [phone]

## 2022-01-25 NOTE — Anesthesia Preprocedure Evaluation (Signed)
Anesthesia Evaluation  Patient identified by MRN, date of birth, ID band Patient awake    Reviewed: Allergy & Precautions, H&P , NPO status , Patient's Chart, lab work & pertinent test results  Airway Mallampati: II   Neck ROM: full    Dental   Pulmonary neg pulmonary ROS   breath sounds clear to auscultation       Cardiovascular negative cardio ROS  Rhythm:regular Rate:Normal     Neuro/Psych  PSYCHIATRIC DISORDERS Anxiety Depression       GI/Hepatic   Endo/Other  Hypothyroidism  Pancreatic duct dilation  Renal/GU      Musculoskeletal  (+) Arthritis ,    Abdominal   Peds  Hematology   Anesthesia Other Findings   Reproductive/Obstetrics                             Anesthesia Physical Anesthesia Plan  ASA: 2  Anesthesia Plan: MAC   Post-op Pain Management:    Induction: Intravenous  PONV Risk Score and Plan: 2 and Propofol infusion and Treatment may vary due to age or medical condition  Airway Management Planned: Nasal Cannula  Additional Equipment:   Intra-op Plan:   Post-operative Plan:   Informed Consent: I have reviewed the patients History and Physical, chart, labs and discussed the procedure including the risks, benefits and alternatives for the proposed anesthesia with the patient or authorized representative who has indicated his/her understanding and acceptance.     Dental advisory given  Plan Discussed with: CRNA, Anesthesiologist and Surgeon  Anesthesia Plan Comments:        Anesthesia Quick Evaluation

## 2022-01-25 NOTE — Telephone Encounter (Signed)
Can you help me resubmit this GI pathogen panel with different diagnosis than acute diarrhea.  I would like to resubmit it with a diagnosis of SIBO Dx : W72.1828    Can you help or who do I ask?    See pt Mychart notes  01/22/22 as well as from 12/6   This is patient's second request.. I think I forwarded it to you but you may have been out... should I send it to someone else?

## 2022-01-25 NOTE — Op Note (Signed)
[facility] Patient Name: Jessica Miles Procedure Date: 01/25/2022 MRN: [id number] Attending MD: Justice Britain , MD, [id number] Date of Birth: 05-12-1945 CSN: [id number] Age: 77 Admit Type: Outpatient Procedure:                Upper EUS Indications:              Dilated pancreatic duct on MRCP, Epigastric                            abdominal pain, Weight loss Providers:                Justice Britain, MD, Vista Lawman, RN, Fanny Skates RN, RN, Frazier Richards, Technician Referring MD:             Thornton Park MD, MD, Jinny Sanders MD, MD Medicines:                Monitored Anesthesia Care Complications:            No immediate complications. Estimated Blood Loss:     Estimated blood loss was minimal. Procedure:                Pre-Anesthesia Assessment:                           - Prior to the procedure, a History and Physical                            was performed, and patient medications and                            allergies were reviewed. The patient's tolerance of                            previous anesthesia was also reviewed. The risks                            and benefits of the procedure and the sedation                            options and risks were discussed with the patient.                            All questions were answered, and informed consent                            was obtained. Prior Anticoagulants: The patient has                            taken no anticoagulant or antiplatelet agents. ASA                            Grade Assessment: III - A patient with severe  systemic disease. After reviewing the risks and                            benefits, the patient was deemed in satisfactory                            condition to undergo the procedure.                           After obtaining informed consent, the endoscope was                            passed under direct  vision. Throughout the                            procedure, the patient's blood pressure, pulse, and                            oxygen saturations were monitored continuously. The                            [id number]-[id number] ([id number]) Olympus endoscope was introduced                            through the mouth, and advanced to the second part                            of duodenum. The [id number]-[id number] ([id number]) Olympus                            duodenoscope was introduced through the mouth, and                            advanced to the area of papilla. The [id number]                            ([id number]) Olympus linear ultrasound scope was                            introduced through the mouth, and advanced to the                            duodenum for ultrasound examination from the                            stomach and duodenum. The upper EUS was                            accomplished without difficulty. The patient                            tolerated the procedure. Scope In: Scope Out: Findings:      ENDOSCOPIC FINDING: :      No gross lesions were noted in the  entire esophagus.      The Z-line was irregular and was found 41 cm from the incisors.      A 1 cm hiatal hernia was present.      A few dispersed small erosions with no bleeding and no stigmata of       recent bleeding were found in the gastric antrum.      No other gross lesions were noted in the entire examined stomach.       Biopsies were taken with a cold forceps for histology and Helicobacter       pylori testing.      A medium non-bleeding diverticulum was found in the area of the papilla.      The major papilla was normal.      No gross lesions were noted in the duodenal bulb, in the first portion       of the duodenum and in the second portion of the duodenum. Biopsies were       taken with a cold forceps for histology.      ENDOSONOGRAPHIC FINDING: :      The pancreatic duct was prominent and dilated in the pancreatic head        (3.2 mm -> 4.7 mm), genu of the pancreas (5.2 mm -> 4.3 mm), body of the       pancreas (3.5 mm -> 2.1 mm) and tail of the pancreas (1.5 mm). Within       the neck of the pancreas, mild sidebranch duct dilation was noted.      There was no sign of significant endosonographic parenchymal abnormality       in the pancreatic head, genu of the pancreas, pancreatic body and       pancreatic tail. No masses, no cysts, no calcifications. Difficulty in       passage of the scope into station 4 (as result of the duodenal sweep)       made uncinate pancreas evaluation difficult.      There was no sign of significant endosonographic abnormality in the       common bile duct (4.2 mm) and in the common hepatic duct (6.6 mm). An       unremarkable gallbladder was identified.      Endosonographic imaging of the ampulla showed no intramural       (subepithelial) lesion.      Endosonographic imaging in the visualized portion of the liver showed no       mass.      No malignant-appearing lymph nodes were visualized in the celiac region       (level 20), peripancreatic region and porta hepatis region.      The celiac region was visualized. Impression:               EGD impression:                           - No gross lesions in the entire esophagus. Z-line                            irregular, 41 cm from the incisors.                           - 1 cm hiatal hernia.                           -  Erosive gastropathy with no bleeding and no                            stigmata of recent bleeding in the antrum. No other                            gross lesions in the entire stomach. Biopsied.                           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 Normal major papillary orifice however.                           - No other gross lesions in the duodenal bulb, in                            the first portion of the duodenum and in the second                             portion of the duodenum. Biopsied.                           EUS impression:                           - The pancreatic duct had a prominent appearance in                            the pancreatic head, genu of the pancreas, body of                            the pancreas and tail of the pancreas. There was                            mild pancreatic ductal sidebranch dilation in the                            neck                           - There was no sign of significant parenchymal                            abnormality in the pancreatic head, genu of the                            pancreas, pancreatic body and pancreatic tail.                           - There was no sign of significant pathology in the  common bile duct and in the common hepatic duct.                           - No malignant-appearing lymph nodes were                            visualized in the celiac region (level 20),                            peripancreatic region and porta hepatis region. Moderate Sedation:      Not Applicable - Patient had care per Anesthesia. Recommendation:           - The patient will be observed post-procedure,                            until all discharge criteria are met.                           - Discharge patient to home.                           - Patient has a contact number available for                            emergencies. The signs and symptoms of potential                            delayed complications were discussed with the                            patient. Return to normal activities tomorrow.                            Written discharge instructions were provided to the                            patient.                           - Resume previous diet.                           - Observe patient's clinical course.                           - Await path results.                           - Initiate Nexium 40 mg daily  for 1 month and then                            20 mg daily for 1 month and then may stop if no                            evidence of significant improvement  in symptoms.                           - With the ductal dilation being present, I would                            recommend monitoring with follow-up MRI/MRCP in 1                            year with patient's primary gastroenterologist.                           - Further workup as per primary GI. Although                            patient has had a contrasted CT, she describes                            significant postprandial abdominal discomfort and                            if H. pylori is not found here, then I would think                            it may be reasonable to consider mesenteric                            ischemia rule out but again will defer that to her                            primary GI team.                           - The findings and recommendations were discussed                            with the patient.                           - The findings and recommendations were discussed                            with the patient's family. Procedure Code(s):        --- Professional ---                           [id number], Esophagogastroduodenoscopy, flexible,                            transoral; with endoscopic ultrasound examination                            limited to the esophagus, stomach or duodenum, and  adjacent structures                           43239, Esophagogastroduodenoscopy, flexible,                            transoral; with biopsy, single or multiple Diagnosis Code(s):        --- Professional ---                           K22.89, Other specified disease of esophagus                           K44.9, Diaphragmatic hernia without obstruction or                            gangrene                           K31.89, Other diseases of stomach and duodenum                            I89.9, Noninfective disorder of lymphatic vessels                            and lymph nodes, unspecified                           K86.89, Other specified diseases of pancreas                           R10.13, Epigastric pain                           R63.4, Abnormal weight loss                           K57.10, Diverticulosis of small intestine without                            perforation or abscess without bleeding                           R93.3, Abnormal findings on diagnostic imaging of                            other parts of digestive tract CPT copyright 2022 [employer]. All rights reserved. The codes documented in this report are preliminary and upon coder review may  be revised to meet current compliance requirements. Justice Britain, MD 01/25/2022 10:17:29 AM Number of Addenda: 0

## 2022-01-25 NOTE — Transfer of Care (Signed)
Immediate Anesthesia Transfer of Care Note  Patient: Jessica Miles  Procedure(s) Performed: ESOPHAGOGASTRODUODENOSCOPY (EGD) WITH PROPOFOL UPPER ENDOSCOPIC ULTRASOUND (EUS) LINEAR BIOPSY  Patient Location: PACU and Endoscopy Unit  Anesthesia Type:MAC  Level of Consciousness: awake, alert , oriented, and patient cooperative  Airway & Oxygen Therapy: Patient Spontanous Breathing and Patient connected to face mask oxygen  Post-op Assessment: Report given to RN and Post -op Vital signs reviewed and stable  Post vital signs: Reviewed and stable  Last Vitals:  Vitals Value Taken Time  BP 116/52 01/25/22 1003  Temp    Pulse 78 01/25/22 1004  Resp 21 01/25/22 1004  SpO2 100 % 01/25/22 1004  Vitals shown include unvalidated device data.  Last Pain:  Vitals:   01/25/22 0757  TempSrc: Tympanic  PainSc: 0-No pain         Complications: No notable events documented.

## 2022-01-26 ENCOUNTER — Other Ambulatory Visit: Payer: Self-pay

## 2022-01-26 ENCOUNTER — Other Ambulatory Visit: Payer: Self-pay | Admitting: Gastroenterology

## 2022-01-26 ENCOUNTER — Other Ambulatory Visit: Payer: Medicare Other

## 2022-01-26 DIAGNOSIS — R197 Diarrhea, unspecified: Secondary | ICD-10-CM

## 2022-01-26 DIAGNOSIS — R143 Flatulence: Secondary | ICD-10-CM

## 2022-01-26 DIAGNOSIS — Q453 Other congenital malformations of pancreas and pancreatic duct: Secondary | ICD-10-CM

## 2022-01-26 DIAGNOSIS — R194 Change in bowel habit: Secondary | ICD-10-CM

## 2022-01-26 DIAGNOSIS — R140 Abdominal distension (gaseous): Secondary | ICD-10-CM

## 2022-01-26 DIAGNOSIS — R110 Nausea: Secondary | ICD-10-CM

## 2022-01-26 LAB — SURGICAL PATHOLOGY

## 2022-01-26 MED ORDER — COLESTIPOL HCL 1 G PO TABS: 2.0000 g | ORAL_TABLET | Freq: Two times a day (BID) | ORAL | 1 refills | Status: DC

## 2022-01-27 NOTE — Anesthesia Postprocedure Evaluation (Signed)
Anesthesia Post Note  Patient: Jessica Miles  Procedure(s) Performed: ESOPHAGOGASTRODUODENOSCOPY (EGD) WITH PROPOFOL UPPER ENDOSCOPIC ULTRASOUND (EUS) LINEAR BIOPSY     Patient location during evaluation: PACU Anesthesia Type: MAC Level of consciousness: awake and alert Pain management: pain level controlled Vital Signs Assessment: post-procedure vital signs reviewed and stable Respiratory status: spontaneous breathing, nonlabored ventilation, respiratory function stable and patient connected to nasal cannula oxygen Cardiovascular status: stable and blood pressure returned to baseline Postop Assessment: no apparent nausea or vomiting Anesthetic complications: no   No notable events documented.  Last Vitals:  Vitals:   01/25/22 1025 01/25/22 1030  BP:  (!) 121/54  Pulse: 65 67  Resp: (!) 21 (!) 28  Temp:    SpO2: 100% 100%    Last Pain:  Vitals:   01/25/22 1020  TempSrc:   PainSc: 0-No pain                 Rosalina Dingwall S

## 2022-01-28 ENCOUNTER — Encounter: Payer: Self-pay | Admitting: Gastroenterology

## 2022-01-28 LAB — 5 HIAA, QUANTITATIVE, URINE, 24 HOUR
5 HIAA, 24 Hour Urine: 2.7 mg/24 h (ref <=6.0)
Total Volume: 1080 mL

## 2022-01-28 LAB — CALCITONIN: Calcitonin: <2 pg/mL (ref <=5)

## 2022-01-28 LAB — VASOACTIVE INTESTINAL PEPTIDE (VIP): Vasoactive Intest Polypeptide: 53 pg/mL (ref <78)

## 2022-01-28 LAB — GASTRIN: Gastrin: 29 pg/mL (ref <=100)

## 2022-01-28 LAB — EXTRA URINE SPECIMEN

## 2022-01-29 ENCOUNTER — Encounter (HOSPITAL_COMMUNITY): Payer: Self-pay | Admitting: Gastroenterology

## 2022-01-31 ENCOUNTER — Encounter: Payer: Self-pay | Admitting: Gastroenterology

## 2022-02-01 LAB — SOMATOSTATIN: Somatostatin: <21 pg/mL (ref <=30)

## 2022-02-02 ENCOUNTER — Encounter: Payer: Self-pay | Admitting: Family Medicine

## 2022-02-02 ENCOUNTER — Other Ambulatory Visit (HOSPITAL_COMMUNITY): Payer: Self-pay

## 2022-02-02 ENCOUNTER — Telehealth: Payer: Self-pay | Admitting: Pharmacy Technician

## 2022-02-02 ENCOUNTER — Encounter: Payer: Self-pay | Admitting: Gastroenterology

## 2022-02-02 LAB — PANCREATIC ELASTASE, FECAL: Pancreatic Elastase-1, Stool: 398 mcg/g

## 2022-02-02 NOTE — Telephone Encounter (Signed)
Patient Advocate Encounter  Received notification from [employer] that prior authorization for ESOMEPRAZOLE '40MG'$  is required.   PA submitted on 1.24.24 Key [id number]  Status is pending

## 2022-02-07 ENCOUNTER — Encounter: Payer: Self-pay | Admitting: Podiatry

## 2022-02-08 NOTE — Telephone Encounter (Signed)
Dawn- Please see below. Is this something that you can help this patient with?  Amy

## 2022-02-10 ENCOUNTER — Encounter: Payer: Self-pay | Admitting: Gastroenterology

## 2022-02-15 NOTE — Telephone Encounter (Signed)
Patient Advocate Encounter  Prior Authorization for ESOMEPRAZOLE '40MG'$  has been approved.    PA# [id number] Effective dates: 12.25.23 through 12.31.2099

## 2022-02-17 ENCOUNTER — Encounter: Payer: Self-pay | Admitting: Family Medicine

## 2022-02-18 NOTE — Telephone Encounter (Signed)
Patient called in regarding this, she would like to know about the coding regarding this lab,. She stated that insurance will not pay for it,and she does not want it to be sent to collections,due to her not paying when insurance was suppose to pay for it.

## 2022-02-18 NOTE — Telephone Encounter (Signed)
Spoke to Jessica Miles today re: lab code correction  Will follow-up with the patient next week regarding the outcome.

## 2022-02-22 ENCOUNTER — Ambulatory Visit (INDEPENDENT_AMBULATORY_CARE_PROVIDER_SITE_OTHER): Payer: Medicare Other | Admitting: Gastroenterology

## 2022-02-22 ENCOUNTER — Encounter: Payer: Self-pay | Admitting: Gastroenterology

## 2022-02-22 VITALS — BP 118/60 | Ht 66.0 in | Wt 104.0 lb

## 2022-02-22 DIAGNOSIS — R935 Abnormal findings on diagnostic imaging of other abdominal regions, including retroperitoneum: Secondary | ICD-10-CM

## 2022-02-22 DIAGNOSIS — R194 Change in bowel habit: Secondary | ICD-10-CM | POA: Diagnosis not present

## 2022-02-22 DIAGNOSIS — R140 Abdominal distension (gaseous): Secondary | ICD-10-CM

## 2022-02-22 DIAGNOSIS — R748 Abnormal levels of other serum enzymes: Secondary | ICD-10-CM

## 2022-02-22 NOTE — Progress Notes (Signed)
Referring Provider: Jinny Sanders, MD Primary Care Physician:  Jinny Sanders, MD  Chief Complaint: Gas, altered bowel habits, abdominal pain   IMPRESSION:  Prominent pancreatic duct without pancreatic parenchyma abnormality. Slight prominence of the pancreatic duct on MRI/MRCP and follow-up CT of the pancreas. Lipase is slightly elevated as is the ALT.  EUS negative for pancreatic or ampullary lesion.   Long-standing history of constipation-IBS previously controlled with FODMAP diet. More recently having diarrhea with temporal association in symptom change after Covid.  Breath-test negative for SIBO. Stool studies negative for inflammation and fat. Evaluation for unusual causes of chronic diarrhea also negative.  EUS negative for chronic pancreatitis. Repeat trial of Xifaxan exacerbated her symptoms. She has completed multiple online education programs for  IBS including hypnotherapy.  We discussed possibly participating in consultation with a online nutritionist who specializes in GI etiologies.  We also discussed getting a second opinion with Dr. Matilde Bash.  Symptomatic diverticulosis +/- SIBO given her presentation of left lower quadrant pain with altered bowel habits different from her chronic constipation IBS symptoms. Some clinical improvement with empiric antibiotics for SIBO x 2. But, now with recurrent and slightly different symptoms. Breath test is negative for SIBO.   Abnormal transaminases in 2023, normal since 12/23.  Liver appears normal on multiple cross-sectional imaging studies.   PLAN: - Titrate colestipol dose to symptomatic effect - Trial of peppermint oil to try to control symptoms - Discussed using ginger at bedtime (has some published data for SIBO) - Office follow-up PRN - No surveillance endoscopy planned given reassuring EUS and she has aged out of colon cancer surveillance  Please see the "Patient Instructions" section for addition details about the  plan.   HPI: Jessica Miles is a 77 y.o. female who returns in follow-up for altered bowel habits. She has a longstanding history of constipation-IBS diagnosed in 2015 and treated with the FODMAP diet, diverticulosis, and hemorrhoids treated with Analpram cream. Testing for celiac was negative. She was previously followed by Dr. Vira [facility]ar at [facility]    She had Covid 19 in September 2021 with severe symptoms including significant weight loss down to 101 pounds. She had to stop FODMAP while recovering from Covid. In mid-November she developed a sharp, LLQ abdominal pain. She was very aware of stool or gas moving through her intestines and she has had ongoing problems with change in bowel habits, gas, and abdominal pain since that time.   A CT of the abdomen and pelvis 12/16/2019 showed abnormal wall thickening in the distal transverse colon, descending colon, sigmoid colon, and parts of the ascending colon with focal wall thickening in the ascending colon worrisome for the possibility of malignancy.  A prominent pancreatic duct which appears slightly more prominent than the 2012 exam was also noted.  Resumed FODMAP diet, increasing water, and a probiotic (VSL x 3 months). Took amoxicillin and the CT prep but she developed severe diarrhea with mushy, malodorous stools, rectal pain and burning, and a hemorrhoid flare. She was concerned that she wasn't absorbing anything.    She took a 7 day course of prednisone that completely resolved her symptoms.   At the time of her initial consultation 02/05/20 her stool had returned to normal. She regained the 10 pounds that she lost during her period of acute symptoms.   02/06/20 negative GI pathogen panel, fecal calprotectin 34  Colonoscopy 03/06/20 showed sigmoid diverticulosis, tortuous colon and small internal hemorrhoids.  MRI/MRCP 02/21/20: Mild Mild diffuse pancreatic ductal  dilatation measuring up to 4 mm. No evidence of pancreatic mass or other  obstructing etiology. Gallbladder sludge, without evidence of cholecystitis or biliary ductal dilatation.  Seen in the office 03/10/20 for symptomatic diverticulosis +/- SIBO given symptoms of left lower quadrant pain with altered bowel habits different from her chronic constipation IBS symptoms.  That time she is continued on a FODMAP diet and VSL probiotics were discussed.  She was given a trial of Xifaxan 550 mg 3 times daily x2 weeks and told to add Citrucel.     Urgent office visit 04/2021 with Ellouise Newer for diarrhea. C diff negative. GI pathogen panel negative. Xifaxan x 14 days resulted in significant symptom improvement.  Followed by a trial of dicyclomine.  She switched her diet from low FODMAP to SIBO diet after reading about it online.   At time of office visit in October 2023 she reported developing severe fatigue with associated left-sided abdominal pain. She felt like the smell of her gas worsened.  At that time, liver enzymes were elevated. They have were elevated on retesting 2 months later.  She was having stools that were 3-5 Bristol scale most days. However, she would have days without any bowel movement and sometimes even more frequently. There wass significant urgency. Texture of the stool has changed. Appetite is good. Weight is overall stable, although she'd like to regain more.   Her transaminases were found to be elevated in August 2023.  AST 39 and ALT 38.  On repeat testing in October 2023 AST had increased to 50 and ALT to 54.  Total bilirubin was normal.  Alk phos is slightly elevated at 121.  Other labs in August showed normal iron and ferritin and a normal CBC.  In October, stool negative for pancreatic elastase.  Lipase mildly elevated at 81. ALT had improved to 41 but was still slightly elevated.   Liver enzymes have normalized are remain normal since 12/23.   CT abd/pelvis with contrast 11/02/21 showed stable and nonspecific pancreatic duct dilation. The pancreas  otherwise appears normal. No acute intra-abdominal or intrapelvic process identified.   Patient submitted TrioSmart Breath test on her own. Results were negative for SIBO. But, she developed severe abdominal pain and diarrhea after drinking the lactulose for the test. At her request, another round of Xifaxan 550 mg TID x 14 days was prescribed. However, symptoms escalated and she felt they may be due to side effects of the Xifaxan.   Returns today. She is frustrated because symptoms now started 2 years ago this month. She has stopped Citrucel and peppermint oil. She is frustrated by post-prandial diarrhea, gas, and eructation. The symptoms are so bad that she had to cancel Thanksgiving. Has tried to submit testing for IBS-Smart through [employer]. She is also considering medical marijuana.   EUS 01/25/22 with Dr. Rush Landmark was negative for worrisome pancreas findings.   [facility] declined the referral for a second opinion. I reached out to Dr. Loni Muse to see if she was personally available.   Gastrin, calcitonin, VIP, and 5 HIAA were normal.   Pancreatic elastase normal x 2  Returns today in follow-up. On a higher dose of colestipol 2 g BID. She found a diet online that is SIBO friendly and has tried to overlap this with a low FODMAP. Following this diet, she has been able to eat more and has actually started to gain some weight. She has gained 3 pounds on her home scale. Continues to have cold waves moving through  her Core. She is looking forward to upcoming consultation with Dr. Matilde Bash.    Endoscopic history: - Colonoscopy in 2012 for abdominal pain and again in 2019 at the [facility] clinic with Dr. Vira Agar.  It showed a "tortuous colon" and hemorrhoids. A pediatric colonoscope was used. Unfortunately, Dr. Percell Boston colonoscopy report is not available in [place], but, Dr. Diona Browner summarized it in a clinic note.  - Colonoscopy 03/06/20 showed sigmoid diverticulosis, tortuous colon and small internal  hemorrhoids.    Past Medical History:  Diagnosis Date   Allergy    Anxiety    COVID-19 virus infection    09-23-2019- 9-16 had monoclonal infusion    Depression    History of IBS    HPV (human papilloma virus) infection    Hyperlipidemia    82- not taking statin    Loss of taste    Neuropathy 2015   surgery for Mortons neuroma in 1985   Osteopenia    Raynaud's disease 02/2020   Tachycardia    Thyroid disease     Past Surgical History:  Procedure Laterality Date   BIOPSY  01/25/2022   Procedure: BIOPSY;  Surgeon: Irving Copas., MD;  Location: [facility];  Service: Gastroenterology;;   COLONOSCOPY  2019   hems only    ESOPHAGOGASTRODUODENOSCOPY (EGD) WITH PROPOFOL N/A 01/25/2022   Procedure: ESOPHAGOGASTRODUODENOSCOPY (EGD) WITH PROPOFOL;  Surgeon: Irving Copas., MD;  Location: [facility];  Service: Gastroenterology;  Laterality: N/A;   EUS N/A 01/25/2022   Procedure: UPPER ENDOSCOPIC ULTRASOUND (EUS) LINEAR;  Surgeon: Irving Copas., MD;  Location: [facility];  Service: Gastroenterology;  Laterality: N/A;   FOOT NEUROMA SURGERY  1985   WISDOM TOOTH EXTRACTION      Current Outpatient Medications  Medication Sig Dispense Refill   ALPHA LIPOIC ACID PO Take 100 mg by mouth daily.     ascorbic acid (VITAMIN C) 500 MG tablet Take 500 mg by mouth 2 (two) times daily.      atenolol (TENORMIN) 25 MG tablet Take 25 mg by mouth daily as needed (palpitations/pulse racing).     buPROPion ER (WELLBUTRIN SR) 100 MG 12 hr tablet Take 1 tablet (100 mg total) by mouth 2 (two) times daily. (Patient taking differently: Take 100 mg by mouth daily.) 180 tablet 1   Calcium Carb-Cholecalciferol (CALCIUM 600 + D PO) Take 1 tablet by mouth in the morning. 600 mg /400 mcg     Cholecalciferol (VITAMIN D-3 PO) Take 5,000 Units by mouth in the morning.     colestipol (COLESTID) 1 g tablet TAKE 2 TABLETS(2 GRAMS) BY MOUTH TWICE DAILY. MAY INCREASE TO 3 GM TOTAL IF STILL  HAVING DIARRHEA 360 tablet 0   Cyanocobalamin (VITAMIN B-12) 2500 MCG SUBL Take 2,500 mcg by mouth in the morning.     esomeprazole (NEXIUM) 40 MG capsule Take 1 capsule (40 mg total) by mouth daily. 30 capsule 1   hydrocortisone-pramoxine (ANALPRAM-HC) 2.5-1 % rectal cream Place 1 Application rectally as needed for hemorrhoids or anal itching. 30 g 1   ketoconazole (NIZORAL) 2 % cream Apply between the third and fourth toe twice a day (Patient taking differently: Apply 1 Application topically 2 (two) times daily as needed for irritation (podiatry).) 60 g 2   levothyroxine (SYNTHROID) 100 MCG tablet TAKE ONE TABLET DAILY EXCEPT TAKE ONE-HALF (1/2) TABLET ON SUNDAY. SIX AND ONE-HALF TABLETS TOTAL PER WEEK 90 tablet 3   Lifitegrast (XIIDRA) 5 % SOLN Place 1 drop into both eyes 2 (two) times  daily.     MAGNESIUM PO Take 125 mg by mouth daily.     MELATONIN PO Take 10 mg by mouth at bedtime.     Methylcellulose, Laxative, (CITRUCEL PO) Take 1 Dose by mouth 2 (two) times daily between meals as needed.     MULTIPLE VITAMIN PO Take 1 tablet by mouth in the morning.     Multiple Vitamins-Minerals (ZINC PO) Take 22 mg by mouth in the morning.     mupirocin cream (BACTROBAN) 2 % Apply 1 application topically 2 (two) times daily. (Patient taking differently: Apply 1 application  topically daily as needed (Podiatry).) 15 g 2   nitroGLYCERIN (NITRO-DUR) 0.2 mg/hr patch Place 1 patch (0.2 mg total) onto the skin daily. (Patient taking differently: Place 0.2 mg onto the skin daily as needed (When walking).) 30 patch 12   NON FORMULARY Take 1 tablet by mouth daily. VSL live probiotic     OVER THE COUNTER MEDICATION Take 1.5-2 tablets by mouth at bedtime. gummy 55 mg of CBD 5 mg THC     OVER THE COUNTER MEDICATION Take 1 capsule by mouth 2 (two) times daily with a meal. Pepperment     thiamine (VITAMIN B-1) 100 MG tablet Take 100 mg by mouth daily.     TYRVAYA 0.03 MG/ACT SOLN Place 1 spray into both nostrils  daily as needed (dry eyes).     No current facility-administered medications for this visit.    Allergies as of 02/22/2022 - Review Complete 02/22/2022  Allergen Reaction Noted   Fluticasone Other (See Comments) 03/13/2015   Mometasone Other (See Comments) 03/13/2015   Fexofenadine Palpitations 07/22/2013      Physical Exam: General:   Alert,  well-nourished, pleasant and cooperative in NAD Head:  Normocephalic and atraumatic. Eyes:  Sclera clear, no icterus.   Conjunctiva pink. Abdomen:  Soft, thin, nontender, nondistended, normal bowel sounds, no rebound or guarding. No hepatosplenomegaly. I am unable to reproduce her abdominal pain.    Extremities:  No clubbing or edema. Neurologic:  Alert and  oriented x4;  grossly nonfocal Skin:  Intact without significant lesions or rashes. Psych:  Alert and cooperative. Normal mood and affect.  I spent 32 minutes, including in depth chart review, independent review of results, communicating results with the patient directly, face-to-face time with the patient, coordinating care, and ordering studies and medications as appropriate, and documentation.    Tashaun Obey L. Tarri Glenn, MD, MPH 03/01/2022, 1:51 PM

## 2022-02-22 NOTE — Patient Instructions (Addendum)
I am delighted you will be seeing Dr. Matilde Bash in April.   Adjust the colestipol as you are able to control the diarrhea.  We discussed trying peppermint oil to control your symptoms.  Ginger 1000 mg at bedtime (has some published data for SIBO).  Please let me know if you need anything else.

## 2022-02-25 ENCOUNTER — Ambulatory Visit (INDEPENDENT_AMBULATORY_CARE_PROVIDER_SITE_OTHER): Payer: Medicare Other | Admitting: *Deleted

## 2022-02-25 DIAGNOSIS — L603 Nail dystrophy: Secondary | ICD-10-CM

## 2022-02-25 DIAGNOSIS — B351 Tinea unguium: Secondary | ICD-10-CM

## 2022-02-25 NOTE — Patient Instructions (Signed)

## 2022-02-25 NOTE — Progress Notes (Signed)
Patient presents today for the 1st laser treatment. Diagnosed with mycotic nail infection by Dr. Milinda Pointer.   Toenail most affected is 3rd and 5th left.  All other systems are negative.  Nails were filed thin. Laser therapy was administered to 3rd and 5th toenails left and patient tolerated the treatment well. All safety precautions were in place.   ~Did 4 passes on both affected nails only~   Follow up in 4 weeks for laser # 2.

## 2022-03-01 ENCOUNTER — Encounter: Payer: Self-pay | Admitting: Gastroenterology

## 2022-03-05 ENCOUNTER — Encounter: Payer: Self-pay | Admitting: Gastroenterology

## 2022-03-08 MED ORDER — COLESTIPOL HCL 1 G PO TABS: 2.0000 g | ORAL_TABLET | Freq: Two times a day (BID) | ORAL | 3 refills | Status: DC

## 2022-03-23 ENCOUNTER — Encounter: Payer: Self-pay | Admitting: Gastroenterology

## 2022-03-25 ENCOUNTER — Ambulatory Visit (INDEPENDENT_AMBULATORY_CARE_PROVIDER_SITE_OTHER): Payer: Medicare Other | Admitting: *Deleted

## 2022-03-25 DIAGNOSIS — B353 Tinea pedis: Secondary | ICD-10-CM

## 2022-03-25 DIAGNOSIS — L603 Nail dystrophy: Secondary | ICD-10-CM

## 2022-03-25 NOTE — Progress Notes (Signed)
Patient presents today for the 2nd laser treatment. Diagnosed with mycotic nail infection by Dr. Milinda Pointer.   Toenail most affected is 3rd and 5th left. Not much change as of yet.  All other systems are negative.  Nails were filed thin. Laser therapy was administered to 3rd and 5th toenails left and patient tolerated the treatment well. All safety precautions were in place.   ~Did 4 passes on both affected nails only~   Follow up in 4 weeks for laser # 3.

## 2022-03-30 ENCOUNTER — Encounter: Payer: Self-pay | Admitting: Family Medicine

## 2022-03-30 ENCOUNTER — Telehealth: Payer: Self-pay | Admitting: Family Medicine

## 2022-03-31 NOTE — Telephone Encounter (Signed)
Jessica Miles please help

## 2022-04-01 NOTE — Telephone Encounter (Signed)
Patient called in to follow up on this. She stated that she needs to know something by the end of today because they are getting ready to leave. Thank you!

## 2022-04-06 NOTE — Telephone Encounter (Addendum)
Just FYI: [facility] is refilng with new codes and has asked that we allow 30-45 days for the ref-file. Mrs. Jessica Miles has been informed via email at her request via [email or web address]    Update- I have been working with [facility] attempting to get the code changed for this visit from April of 2023. The code that Dr. Ermalene SearingBedsole supplied,  [phone] on their end is showing invalid.Marland Kitchen.Marland Kitchen.Marland Kitchen.Marland Kitchen.even the K63.821 Small intestinal bacterial growth (which is showing as the updated code for 2024 is not working.  Is there anything else that can be used?  Dawn-I know this is [facility], but I am hoping that you may have some coding insight in this regard...    From: Lavenia AtlasMcDaniel, Jessica B [email or web address]> Sent: Wednesday, April 06, 2022 8:56 AM To: Conard NovakYarborough, Amy [email or web address]> Cc: [place], Patrick [email or web address]> Subject: RE: SECURE Re: (SECURE) Re-submit claim [name]   Good morning,  Billing states this code is also invalid. Do you have any additional? Lavenia AtlasJessica McDaniel  Sr. Arts development officerCustomer Solutions Specialist  [facility]  [facility]  [address]  [place], [place] [address] [place]  phone [phone] I fax [phone] [email or web address]  [email or web address]    I will send this over to billing.   Thank you,   Lavenia AtlasJessica McDaniel  Sr. Customer Solutions Specialist    CAUTION! This email originated outside of [facility]. DO NOT click links or open attachments unless you recognize the sender and know the content is safe. Please report suspicious emails to: [email or web address]     This message was sent securely by [facility].      Please try [phone] Small intestinal bacterial growth        *Caution - External email - see footer for warnings*  Hi Amy,     Our billing department states the [phone] is an invalid code and will not be billed. Do you have any additional  codes that we could have resubmitted for this patient?     Thank you,        Lavenia AtlasJessica McDaniel  Sr. Arts development officerCustomer Solutions Specialist  [facility]  [facility]  [address]  [place], [place] [address] [place]  phone [phone] I fax [phone] [email or web address]  [email or web address]     Thanks.

## 2022-04-22 ENCOUNTER — Ambulatory Visit (INDEPENDENT_AMBULATORY_CARE_PROVIDER_SITE_OTHER): Payer: Medicare Other

## 2022-04-22 DIAGNOSIS — B351 Tinea unguium: Secondary | ICD-10-CM

## 2022-04-22 DIAGNOSIS — L603 Nail dystrophy: Secondary | ICD-10-CM

## 2022-04-22 NOTE — Progress Notes (Signed)
Patient presents today for the 4th  laser treatment. 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   Toenail most affected 3rd and 5th left.  All other systems are negative.  Nails were filed thin. Laser therapy was administered to 3rd and 5th  toenails left  and patient tolerated the treatment well. All safety precautions were in place.   Single laser pass was done on non-affected nails.   Follow up in 6 weeks for laser # 5.

## 2022-04-27 IMAGING — MR MR ABDOMEN WO/W CM MRCP
15 of 21 series · 32 of 48 positions shown · IV contrast (9ml Multihance)
Comparison: CT on 12/16/2019

CLINICAL DATA: Left abdominal pain for several months. Prominent
pancreatic duct on recent CT.

EXAM:
MRI ABDOMEN WITHOUT AND WITH CONTRAST (INCLUDING MRCP)
TECHNIQUE: Multiplanar multisequence MR imaging of the abdomen was performed
both before and after the administration of intravenous contrast.
Heavily T2-weighted images of the biliary and pancreatic ducts were
obtained, and three-dimensional MRCP images were rendered by post
processing.
CONTRAST:  9mL MULTIHANCE GADOBENATE DIMEGLUMINE 529 MG/ML IV SOLN

[Series 4: cor haste · coronal · 5.0mm · 0.74mm/px · 2 of 28 slices shown]
[im 1/28]
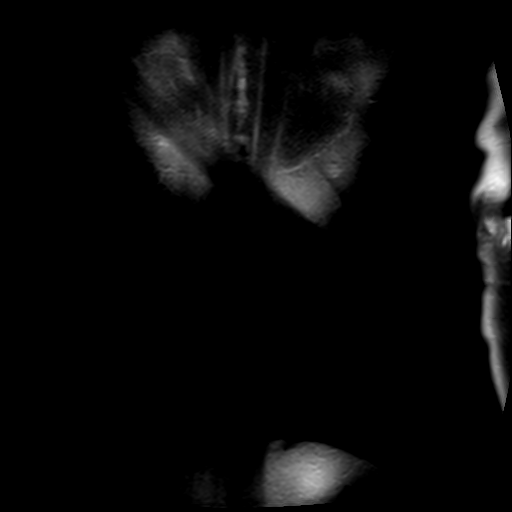
[im 28/28]
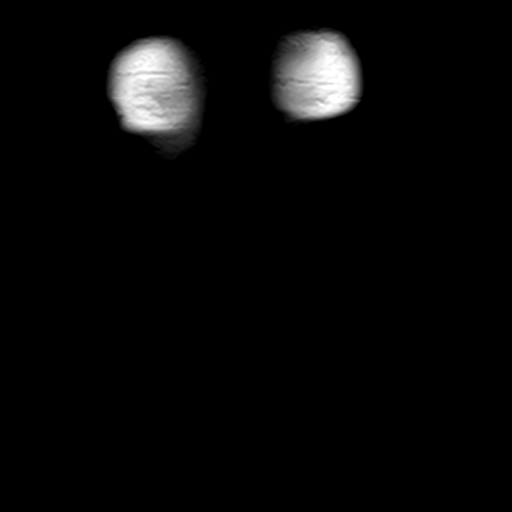

[Series 5: T2 · axial · 6.0mm · 1.12mm/px · 1 of 37 slices shown (1 of 2)]
[im 1/37]
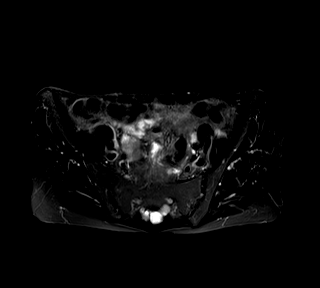

[Series 6: bSSFP · coronal · 5.0mm · 0.78mm/px · 1 of 24 slices shown (1 of 2)]
[im 1/24]
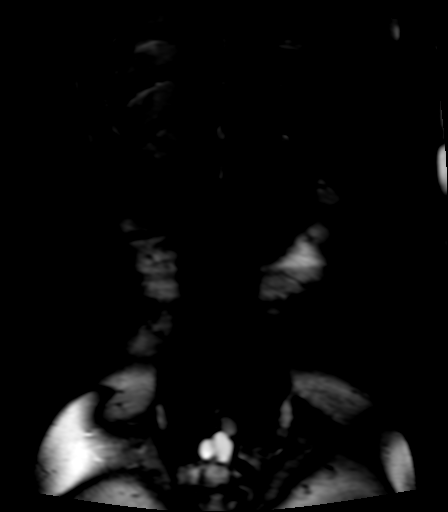

[Series 7: axial haste · axial · 6.0mm · 0.74mm/px · 1 of 42 slices shown]
[im 1/42]
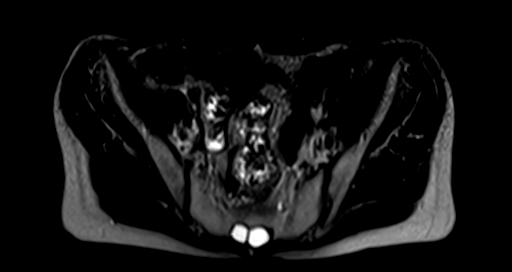

[Series 8: ep2d_diff_b50_500_800_p2_trig · axial · 6.0mm · 1.98mm/px · z∈[-235,+24]mm · 4 of 111 slices shown]
[im 1/111]
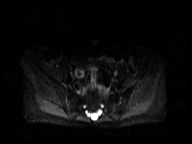
[im 37/111]
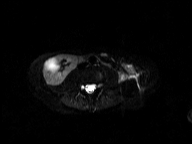
[im 74/111]
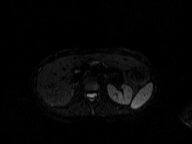
[im 111/111]
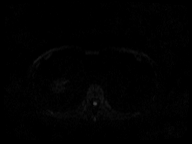

[Series 9: ep2d_diff_b50_500_800_p2_trig_adc · axial · 6.0mm · 1.98mm/px · 1 of 37 slices shown]
[im 1/37]
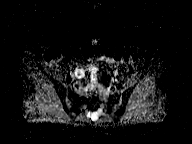

[Series 10: T1 · axial · 6.0mm · 0.74mm/px · z∈[-240,+24]mm · 3 of 82 slices shown]
[im 1/82]
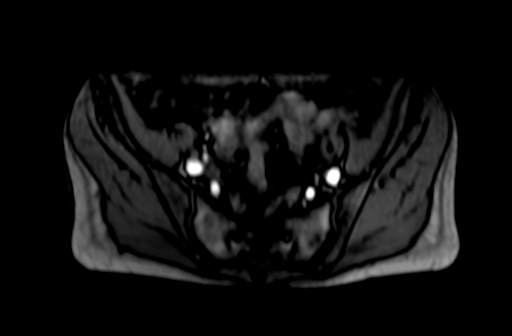
[im 41/82]
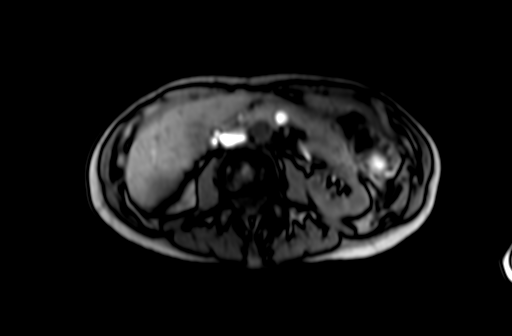
[im 82/82]
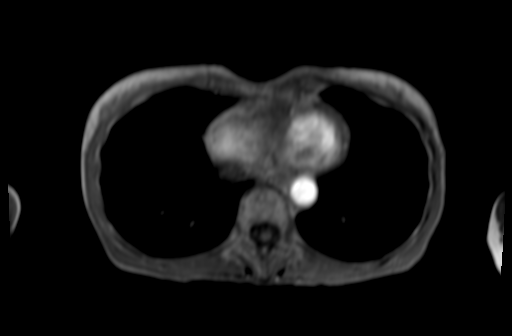

[Series 11: bSSFP · axial · 4.0mm · 0.74mm/px · z∈[-236,+20]mm · 2 of 65 slices shown (2 of 2)]
[im 1/65]
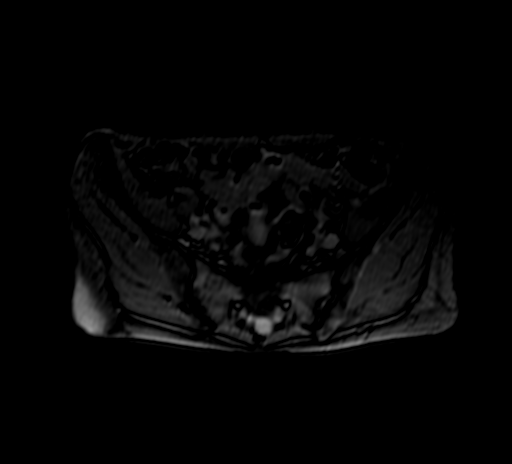
[im 65/65]
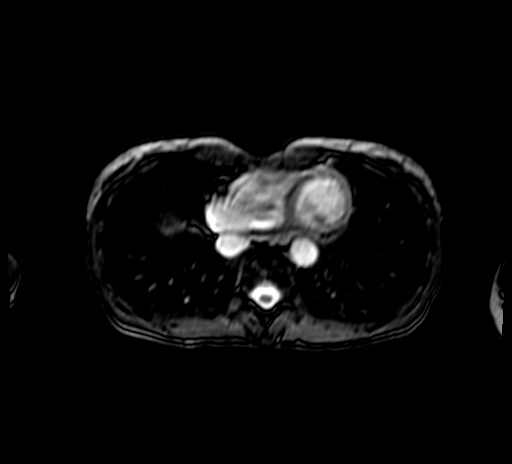

[Series 14: T2 · coronal · 3.0mm · 0.70mm/px · 1 of 41 slices shown (2 of 2)]
[im 1/41]
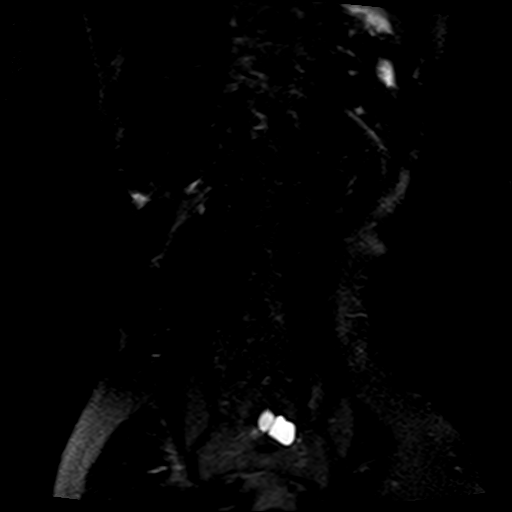

[Series 15: t2_haste_fs_cor_thick_sl_p2_384 · coronal · 50.0mm · 0.78mm/px · 1 of 6 slices shown]
[im 1/6]
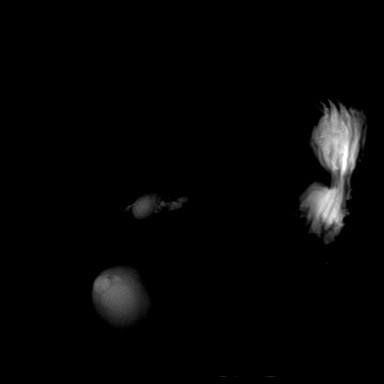

[Series 16: T1 dynamic · axial · non-contrast · 2.5mm · 0.74mm/px · z∈[-236,+21]mm · 3 of 104 slices shown]
[im 1/104]
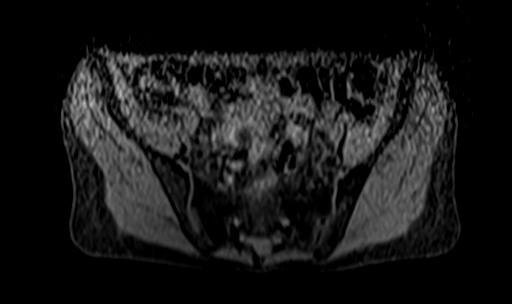
[im 52/104]
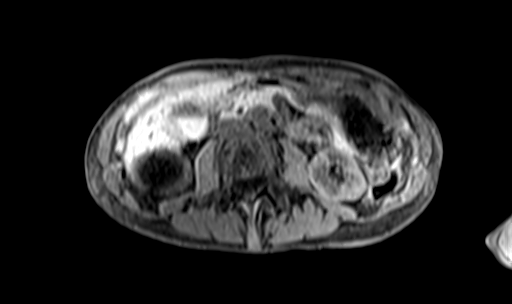
[im 104/104]
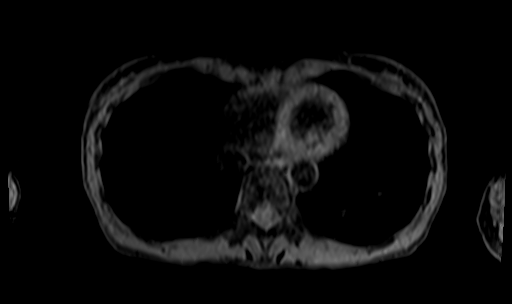

[Series 17: T1 dynamic post-contrast · axial · 2.5mm · 0.74mm/px · z∈[-236,+21]mm · 3 of 104 slices shown (1 of 4)]
[im 1/104]
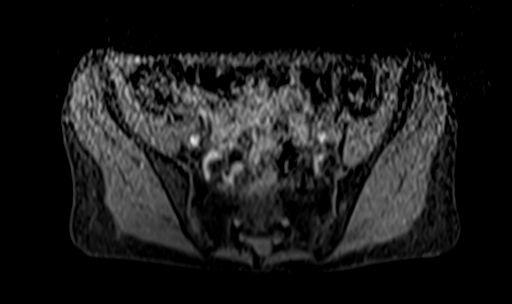
[im 52/104]
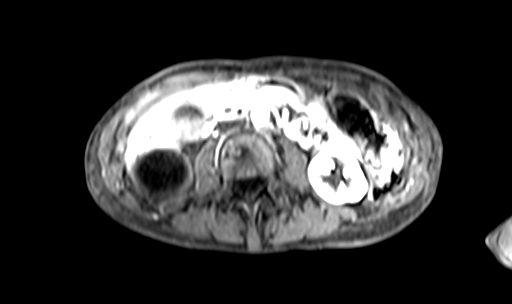
[im 104/104]
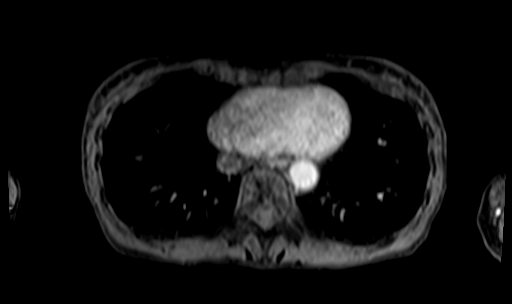

[Series 18: T1 dynamic post-contrast · axial · 2.5mm · 0.74mm/px · z∈[-236,+21]mm · 3 of 104 slices shown (2 of 4)]
[im 1/104]
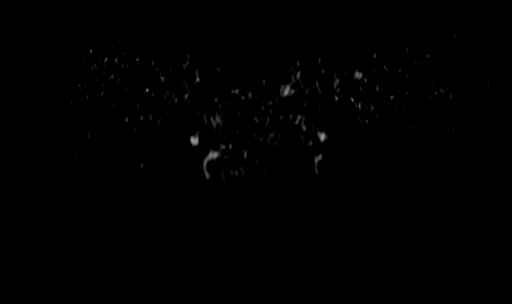
[im 52/104]
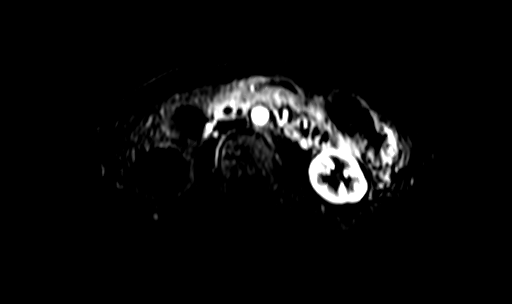
[im 104/104]
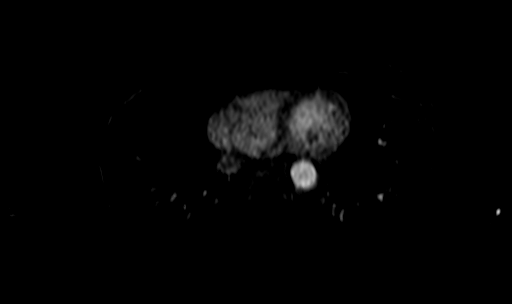

[Series 19: T1 dynamic post-contrast · axial · 2.5mm · 0.74mm/px · z∈[-236,+21]mm · 3 of 104 slices shown (3 of 4)]
[im 1/104]
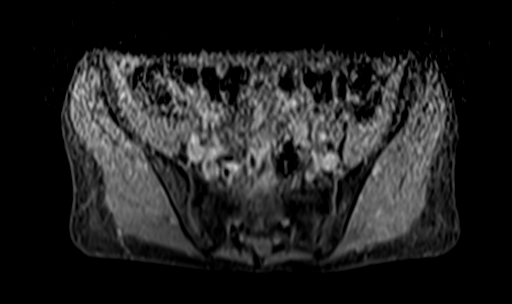
[im 52/104]
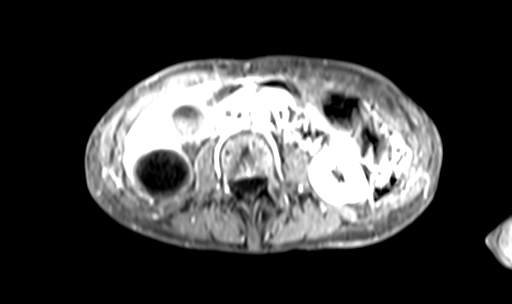
[im 104/104]
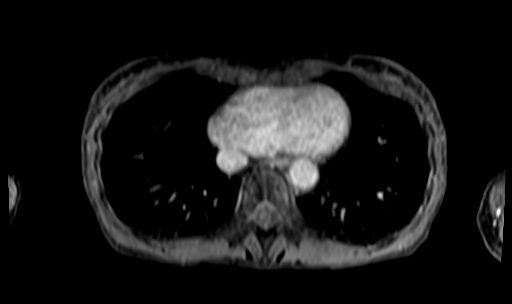

[Series 20: T1 dynamic post-contrast · axial · 2.5mm · 0.74mm/px · z∈[-236,+21]mm · 3 of 104 slices shown (4 of 4)]
[im 1/104]
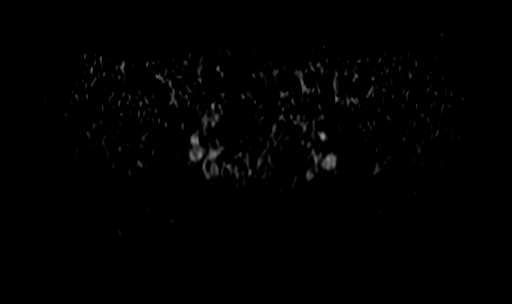
[im 52/104]
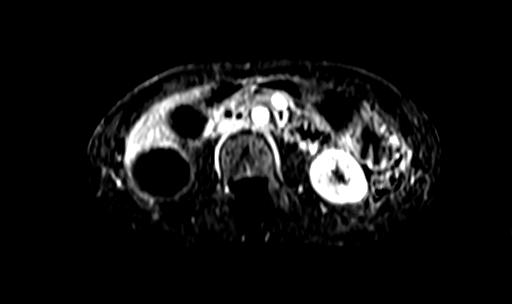
[im 104/104]
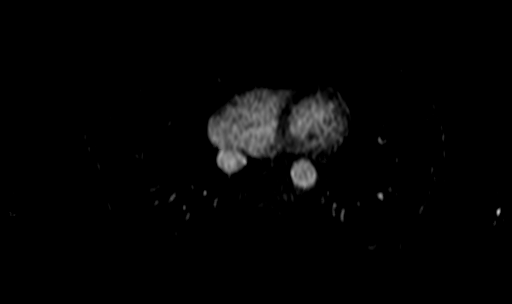

[32 of 48 positions shown; findings below may reference images not displayed]

FINDINGS: Lower chest: No acute findings.

Hepatobiliary: No hepatic masses identified. Layering gallbladder
sludge noted, however there is no evidence of cholecystitis or
biliary ductal dilatation.

Pancreas: Mild diffuse dilatation of the pancreatic duct is seen
measuring up to 4 mm in the pancreatic head. No evidence of
pancreatic mass or pancreatic ductal obstruction. No evidence of
pancreatitis or peripancreatic fluid collections.

Spleen:  Within normal limits in size and appearance.

Adrenals/Urinary Tract: 2 large right renal cysts are seen largest
measuring 6 cm. A tiny sub-cm left renal cyst is seen. No evidence
of renal masses or hydronephrosis.

Stomach/Bowel: Visualized portion unremarkable.

Vascular/Lymphatic: No pathologically enlarged lymph nodes
identified. No abdominal aortic aneurysm.

Other:  None.

Musculoskeletal:  No suspicious bone lesions identified.
IMPRESSION: Mild diffuse pancreatic ductal dilatation measuring up to 4 mm. No
evidence of pancreatic mass or other obstructing etiology.

Gallbladder sludge, without evidence of cholecystitis or biliary
ductal dilatation.

## 2022-05-16 NOTE — Telephone Encounter (Signed)
error 

## 2022-06-01 ENCOUNTER — Other Ambulatory Visit: Payer: Self-pay | Admitting: Family Medicine

## 2022-06-01 DIAGNOSIS — F331 Major depressive disorder, recurrent, moderate: Secondary | ICD-10-CM

## 2022-06-03 ENCOUNTER — Ambulatory Visit (INDEPENDENT_AMBULATORY_CARE_PROVIDER_SITE_OTHER): Payer: Medicare Other

## 2022-06-03 DIAGNOSIS — L603 Nail dystrophy: Secondary | ICD-10-CM

## 2022-06-03 NOTE — Progress Notes (Signed)
Patient presents today for the 5th  laser treatment. 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   Toenail most affected 3rd and 5th left.  All other systems are negative.  Nails were filed thin. Laser therapy was administered to 3rd and 5th  toenails left  and patient tolerated the treatment well. All safety precautions were in place.   Single laser pass was done on non-affected nails.   Follow up in 6 weeks for laser # 6.

## 2022-06-23 ENCOUNTER — Ambulatory Visit (INDEPENDENT_AMBULATORY_CARE_PROVIDER_SITE_OTHER): Payer: Medicare Other | Admitting: Family Medicine

## 2022-06-23 ENCOUNTER — Encounter: Payer: Self-pay | Admitting: Family Medicine

## 2022-06-23 VITALS — BP 116/72 | HR 71 | Temp 97.4°F | Ht 65.5 in | Wt 107.2 lb

## 2022-06-23 DIAGNOSIS — Z91038 Other insect allergy status: Secondary | ICD-10-CM | POA: Diagnosis not present

## 2022-06-23 MED ORDER — TRIAMCINOLONE ACETONIDE 0.5 % EX CREA: 1.0000 | TOPICAL_CREAM | Freq: Two times a day (BID) | CUTANEOUS | 0 refills | Status: DC

## 2022-06-23 NOTE — Assessment & Plan Note (Signed)
Acute, most consistent with hypersensitivity reaction to insect bite, allergic dermatitis.  Will treat with topical triamcinolone 0.5% cream twice daily.  Reviewed possible signs of cellulitis, she will be reevaluated if redness is spreading.  She does plan to start refaxing for GI issues soon and this looks like it would likely cover staph if needed.

## 2022-06-23 NOTE — Progress Notes (Signed)
Patient ID: Jessica Miles, female    DOB: 1945/04/13, 77 y.o.   MRN: [id number]  This visit was conducted in person.  BP 116/72   Pulse 71   Temp (!) 97.4 F (36.3 C) (Temporal)   Ht 5' 5.5" (1.664 m)   Wt 107 lb 4 oz (48.6 kg)   SpO2 97%   BMI 17.58 kg/m    CC:  Chief Complaint  Patient presents with   Insect Bite    C/o bug bite on R arm. Noticed about 1 wk ago. Area is red, swollen and itchy.     Subjective:   HPI: Jessica Miles is a 77 y.o. female presenting on 06/23/2022 for Insect Bite (C/o bug bite on R arm. Noticed about 1 wk ago. Area is red, swollen and itchy. )   New onset skin lesion on right arm ongoing for 1 week. She noted first what appeared to be itchy mosquito bite.  Redness spreading... applied OTC hydrocortisone 2 %.  Very itchy.   Feels well otherwise, no flu like symptoms, fever.  No neurologic changes       Relevant past medical, surgical, family and social history reviewed and updated as indicated. Interim medical history since our last visit reviewed. Allergies and medications reviewed and updated. Outpatient Medications Prior to Visit  Medication Sig Dispense Refill   ALPHA LIPOIC ACID PO Take 100 mg by mouth daily.     ascorbic acid (VITAMIN C) 500 MG tablet Take 500 mg by mouth 2 (two) times daily.      atenolol (TENORMIN) 25 MG tablet Take 25 mg by mouth daily as needed (palpitations/pulse racing).     buPROPion ER (WELLBUTRIN SR) 100 MG 12 hr tablet TAKE 1 TABLET TWICE A DAY 180 tablet 1   Calcium Carb-Cholecalciferol (CALCIUM 600 + D PO) Take 1 tablet by mouth in the morning. 600 mg /400 mcg     Cholecalciferol (VITAMIN D-3 PO) Take 5,000 Units by mouth in the morning.     colestipol (COLESTID) 1 g tablet Take 2 tablets (2 g total) by mouth 2 (two) times daily. 360 tablet 3   Cyanocobalamin (VITAMIN B-12) 2500 MCG SUBL Take 2,500 mcg by mouth in the morning.     esomeprazole (NEXIUM) 40 MG capsule Take 1 capsule (40 mg total) by mouth  daily. 30 capsule 1   hydrocortisone-pramoxine (ANALPRAM-HC) 2.5-1 % rectal cream Place 1 Application rectally as needed for hemorrhoids or anal itching. 30 g 1   ketoconazole (NIZORAL) 2 % cream Apply between the third and fourth toe twice a day (Patient taking differently: Apply 1 Application topically 2 (two) times daily as needed for irritation (podiatry).) 60 g 2   levothyroxine (SYNTHROID) 100 MCG tablet TAKE ONE TABLET DAILY EXCEPT TAKE ONE-HALF (1/2) TABLET ON SUNDAY. SIX AND ONE-HALF TABLETS TOTAL PER WEEK 90 tablet 3   Lifitegrast (XIIDRA) 5 % SOLN Place 1 drop into both eyes 2 (two) times daily.     MAGNESIUM PO Take 125 mg by mouth daily.     MELATONIN PO Take 10 mg by mouth at bedtime.     Methylcellulose, Laxative, (CITRUCEL PO) Take 1 Dose by mouth 2 (two) times daily between meals as needed.     MULTIPLE VITAMIN PO Take 1 tablet by mouth in the morning.     Multiple Vitamins-Minerals (ZINC PO) Take 22 mg by mouth in the morning.     mupirocin cream (BACTROBAN) 2 % Apply 1 application topically 2 (two) times  daily. (Patient taking differently: Apply 1 application  topically daily as needed (Podiatry).) 15 g 2   nitroGLYCERIN (NITRO-DUR) 0.2 mg/hr patch Place 1 patch (0.2 mg total) onto the skin daily. (Patient taking differently: Place 0.2 mg onto the skin daily as needed (When walking).) 30 patch 12   NON FORMULARY Take 1 tablet by mouth daily. VSL live probiotic     OVER THE COUNTER MEDICATION Take 1.5-2 tablets by mouth at bedtime. gummy 55 mg of CBD 5 mg THC     OVER THE COUNTER MEDICATION Take 1 capsule by mouth 2 (two) times daily with a meal. Pepperment     thiamine (VITAMIN B-1) 100 MG tablet Take 100 mg by mouth daily.     TYRVAYA 0.03 MG/ACT SOLN Place 1 spray into both nostrils daily as needed (dry eyes).     No facility-administered medications prior to visit.     Per HPI unless specifically indicated in ROS section below Review of Systems  Constitutional:   Negative for fatigue and fever.  HENT:  Negative for congestion.   Eyes:  Negative for pain.  Respiratory:  Negative for cough and shortness of breath.   Cardiovascular:  Negative for chest pain, palpitations and leg swelling.  Gastrointestinal:  Negative for abdominal pain.  Genitourinary:  Negative for dysuria and vaginal bleeding.  Musculoskeletal:  Negative for back pain.  Neurological:  Negative for syncope, light-headedness and headaches.  Psychiatric/Behavioral:  Negative for dysphoric mood.    Objective:  BP 116/72   Pulse 71   Temp (!) 97.4 F (36.3 C) (Temporal)   Ht 5' 5.5" (1.664 m)   Wt 107 lb 4 oz (48.6 kg)   SpO2 97%   BMI 17.58 kg/m   Wt Readings from Last 3 Encounters:  06/23/22 107 lb 4 oz (48.6 kg)  02/22/22 104 lb (47.2 kg)  01/25/22 100 lb (45.4 kg)      Physical Exam Constitutional:      General: She is not in acute distress.    Appearance: Normal appearance. She is well-developed. She is not ill-appearing or toxic-appearing.  HENT:     Head: Normocephalic.     Right Ear: Hearing normal. Tympanic membrane is not erythematous, retracted or bulging.     Left Ear: Hearing normal. Tympanic membrane is not erythematous, retracted or bulging.     Nose: No mucosal edema or rhinorrhea.     Right Sinus: No maxillary sinus tenderness or frontal sinus tenderness.     Left Sinus: No maxillary sinus tenderness or frontal sinus tenderness.     Mouth/Throat:     Pharynx: Uvula midline.  Eyes:     General: Lids are normal. Lids are everted, no foreign bodies appreciated.     Conjunctiva/sclera: Conjunctivae normal.     Pupils: Pupils are equal, round, and reactive to light.  Neck:     Thyroid: No thyroid mass or thyromegaly.     Vascular: No carotid bruit.     Trachea: Trachea normal.  Cardiovascular:     Rate and Rhythm: Normal rate and regular rhythm.     Heart sounds: S1 normal and S2 normal.  Pulmonary:     Effort: Pulmonary effort is normal. No  tachypnea.     Breath sounds: No decreased breath sounds.  Abdominal:     General: Bowel sounds are normal.     Palpations: Abdomen is soft.     Tenderness: There is no abdominal tenderness.  Musculoskeletal:     Cervical back: Normal  range of motion and neck supple.  Skin:    General: Skin is warm and dry.     Findings: Rash present.     Comments: See photo  Neurological:     Mental Status: She is alert.  Psychiatric:        Mood and Affect: Mood is not anxious or depressed.        Speech: Speech normal.        Behavior: Behavior normal. Behavior is cooperative.        Thought Content: Thought content normal.        Judgment: Judgment normal.       Results for orders placed or performed in visit on 01/26/22  Pancreatic elastase, fecal  Result Value Ref Range   Pancreatic Elastase-1, Stool 398 mcg/g    Assessment and Plan  Allergic reaction to insect bite Assessment & Plan: Acute, most consistent with hypersensitivity reaction to insect bite, allergic dermatitis.  Will treat with topical triamcinolone 0.5% cream twice daily.  Reviewed possible signs of cellulitis, she will be reevaluated if redness is spreading.  She does plan to start refaxing for GI issues soon and this looks like it would likely cover staph if needed.   Other orders -     Triamcinolone Acetonide; Apply 1 Application topically 2 (two) times daily.  Dispense: 30 g; Refill: 0    No follow-ups on file.   Kerby Nora, MD

## 2022-06-24 ENCOUNTER — Ambulatory Visit: Payer: Medicare Other

## 2022-06-24 DIAGNOSIS — B351 Tinea unguium: Secondary | ICD-10-CM

## 2022-06-24 DIAGNOSIS — L603 Nail dystrophy: Secondary | ICD-10-CM

## 2022-06-24 NOTE — Progress Notes (Signed)
Patient presents today for the 6th  laser treatment. 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   Toenail most affected 3rd and 5th left.  All other systems are negative.  Nails were filed thin. Laser therapy was administered to 3rd and 5th  toenails left  and patient tolerated the treatment well. All safety precautions were in place.      Follow up in 6 weeks for laser # 7.

## 2022-07-01 ENCOUNTER — Other Ambulatory Visit: Payer: Medicare Other

## 2022-07-29 ENCOUNTER — Ambulatory Visit (INDEPENDENT_AMBULATORY_CARE_PROVIDER_SITE_OTHER): Payer: Medicare Other | Admitting: Podiatry

## 2022-07-29 DIAGNOSIS — B351 Tinea unguium: Secondary | ICD-10-CM

## 2022-07-29 DIAGNOSIS — L603 Nail dystrophy: Secondary | ICD-10-CM

## 2022-07-29 NOTE — Progress Notes (Signed)
Lasered left 3rd and 5th toenails.  She is already scheduled for next visit  Patient presents for laser treatment #6 today.  She gets the left 3rd and 5th toenails treated with the [facility] Laser System.  She isn't sure how much they've improved.  She states she has two more laser appointments scheduled.    The left 3rd and 5th toenails were debrided with sterile nail nippers and power debriding burr.  Then, the laser was used on the two toenails without incident.  Patient tolerated this well.  F/u as scheduled for laser #7

## 2022-08-22 ENCOUNTER — Encounter: Payer: Self-pay | Admitting: Family Medicine

## 2022-08-26 ENCOUNTER — Ambulatory Visit (INDEPENDENT_AMBULATORY_CARE_PROVIDER_SITE_OTHER): Payer: Self-pay | Admitting: Podiatrist

## 2022-08-26 DIAGNOSIS — L603 Nail dystrophy: Secondary | ICD-10-CM

## 2022-08-26 NOTE — Progress Notes (Signed)
Patient presents today for the 7th  laser treatment. 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   Toenail most affected 3rd and 5th left.  All other systems are negative.  Nails were filed thin. Laser therapy was administered to 3rd and 5th  toenails left  and patient tolerated the treatment well. All safety precautions were in place.      Follow up in 6 weeks for laser # 8.

## 2022-09-23 ENCOUNTER — Other Ambulatory Visit: Payer: Medicare Other

## 2022-10-11 ENCOUNTER — Other Ambulatory Visit: Payer: Self-pay | Admitting: Family Medicine

## 2022-10-11 DIAGNOSIS — E039 Hypothyroidism, unspecified: Secondary | ICD-10-CM

## 2022-10-19 ENCOUNTER — Other Ambulatory Visit (INDEPENDENT_AMBULATORY_CARE_PROVIDER_SITE_OTHER): Payer: Medicare Other

## 2022-10-19 ENCOUNTER — Encounter: Payer: Self-pay | Admitting: Family Medicine

## 2022-10-19 DIAGNOSIS — R7401 Elevation of levels of liver transaminase levels: Secondary | ICD-10-CM

## 2022-10-19 LAB — HEPATIC FUNCTION PANEL
ALT: 50 U/L — ABNORMAL HIGH (ref 0–35)
AST: 37 U/L (ref 0–37)
Albumin: 4.4 g/dL (ref 3.5–5.2)
Alkaline Phosphatase: 94 U/L (ref 39–117)
Bilirubin, Direct: 0.1 mg/dL (ref 0.0–0.3)
Total Bilirubin: 0.6 mg/dL (ref 0.2–1.2)
Total Protein: 6.5 g/dL (ref 6.0–8.3)

## 2022-10-20 LAB — HEPATITIS PANEL, ACUTE
Hep A IgM: NONREACTIVE
Hep B C IgM: NONREACTIVE
Hepatitis B Surface Ag: NONREACTIVE
Hepatitis C Ab: NONREACTIVE

## 2022-10-21 ENCOUNTER — Other Ambulatory Visit: Payer: Medicare Other

## 2022-10-21 ENCOUNTER — Other Ambulatory Visit: Payer: Self-pay | Admitting: Family Medicine

## 2022-10-21 DIAGNOSIS — E039 Hypothyroidism, unspecified: Secondary | ICD-10-CM

## 2022-10-21 DIAGNOSIS — Z13220 Encounter for screening for lipoid disorders: Secondary | ICD-10-CM | POA: Diagnosis not present

## 2022-10-21 DIAGNOSIS — G609 Hereditary and idiopathic neuropathy, unspecified: Secondary | ICD-10-CM

## 2022-10-21 LAB — LIPID PANEL
Cholesterol: 216 mg/dL — ABNORMAL HIGH (ref 0–200)
HDL: 88.7 mg/dL (ref >39.00)
LDL Cholesterol: 116 mg/dL — ABNORMAL HIGH (ref 0–99)
NonHDL: 127.75
Total CHOL/HDL Ratio: 2
Triglycerides: 60 mg/dL (ref 0.0–149.0)
VLDL: 12 mg/dL (ref 0.0–40.0)

## 2022-10-21 LAB — TSH: TSH: 2.51 u[IU]/mL (ref 0.35–5.50)

## 2022-10-21 LAB — T4, FREE: Free T4: 0.96 ng/dL (ref 0.60–1.60)

## 2022-10-21 LAB — T3, FREE: T3, Free: 2.6 pg/mL (ref 2.3–4.2)

## 2022-10-25 ENCOUNTER — Ambulatory Visit (INDEPENDENT_AMBULATORY_CARE_PROVIDER_SITE_OTHER): Payer: Medicare Other

## 2022-10-25 VITALS — Ht 66.0 in | Wt 106.0 lb

## 2022-10-25 DIAGNOSIS — Z0000 Encounter for general adult medical examination without abnormal findings: Secondary | ICD-10-CM

## 2022-10-25 NOTE — Patient Instructions (Signed)
Jessica Miles , Thank you for taking time to come for your Medicare Wellness Visit. I appreciate your ongoing commitment to your health goals. Please review the following plan we discussed and let me know if I can assist you in the future.   Referrals/Orders/Follow-Ups/Clinician Recommendations: Aim for 30 minutes of exercise or brisk walking, 6-8 glasses of water, and 5 servings of fruits and vegetables each day.   This is a list of the screening recommended for you and due dates:  Health Maintenance  Topic Date Due   DTaP/Tdap/Td vaccine (3 - Tdap) 01/10/2018   Pneumonia Vaccine (3 of 3 - PPSV23 or PCV20) 10/11/2018   COVID-19 Vaccine (3 - Pfizer risk series) 10/25/2020   Mammogram  10/17/2021   Flu Shot  08/11/2022   Medicare Annual Wellness Visit  10/25/2023   DEXA scan (bone density measurement)  Completed   Hepatitis C Screening  Completed   Zoster (Shingles) Vaccine  Completed   HPV Vaccine  Aged Out   Colon Cancer Screening  Discontinued    Advanced directives: (In Chart) A copy of your advanced directives are scanned into your chart should your provider ever need it.  Next Medicare Annual Wellness Visit scheduled for next year: Yes  Insert Preventive Care attachment Insert FALL PREVENTION attachment if needed

## 2022-10-25 NOTE — Progress Notes (Signed)
Subjective:   [name] is a 77 y.o. female who presents for Medicare Annual (Subsequent) preventive examination.  Visit Complete: Virtual I connected with  Jessica Miles on 10/25/22 by a audio enabled telemedicine application and verified that I am speaking with the correct person using two identifiers.  Patient Location: Home  Provider Location: Home Office  I discussed the limitations of evaluation and management by telemedicine. The patient expressed understanding and agreed to proceed.  Vital Signs: Because this visit was a virtual/telehealth visit, some criteria may be missing or patient reported. Any vitals not documented were not able to be obtained and vitals that have been documented are patient reported.  Patient Medicare AWV questionnaire was completed by the patient on 10/25/2022; I have confirmed that all information answered by patient is correct and no changes since this date.  Cardiac Risk Factors include: advanced age (>68men, >6 women);dyslipidemia;hypertension     Objective:    Today's Vitals   10/25/22 0804  Weight: 106 lb (48.1 kg)  Height: 5\' 6"  (1.676 m)   Body mass index is 17.11 kg/m.     10/25/2022    8:07 AM 01/25/2022    7:56 AM 10/21/2021    8:59 AM 08/07/2017    2:07 PM 07/30/2017    2:34 PM 07/12/2014   10:47 AM  Advanced Directives  Does Patient Have a Medical Advance Directive? Yes Yes Yes Yes No;Yes No  Type of Estate agent of [place];Living will Healthcare Power of [place];Living will Healthcare Power of [place];Living will Living will;Healthcare Power of Attorney Living will;Healthcare Power of Attorney   Copy of Healthcare Power of Attorney in Chart? No - copy requested No - copy requested No - copy requested     Would patient like information on creating a medical advance directive?  No - Patient declined    No - patient declined information    Current Medications (verified) Outpatient Encounter Medications as  of 10/25/2022  Medication Sig   ALPHA LIPOIC ACID PO Take 100 mg by mouth daily.   ascorbic acid (VITAMIN C) 500 MG tablet Take 500 mg by mouth 2 (two) times daily.    atenolol (TENORMIN) 25 MG tablet Take 25 mg by mouth daily as needed (palpitations/pulse racing).   buPROPion ER (WELLBUTRIN SR) 100 MG 12 hr tablet TAKE 1 TABLET TWICE A DAY   Calcium Carb-Cholecalciferol (CALCIUM 600 + D PO) Take 1 tablet by mouth in the morning. 600 mg /400 mcg   Cholecalciferol (VITAMIN D-3 PO) Take 5,000 Units by mouth in the morning.   colestipol (COLESTID) 1 g tablet Take 2 tablets (2 g total) by mouth 2 (two) times daily.   Cyanocobalamin (VITAMIN B-12) 2500 MCG SUBL Take 2,500 mcg by mouth in the morning.   esomeprazole (NEXIUM) 40 MG capsule Take 1 capsule (40 mg total) by mouth daily.   hydrocortisone-pramoxine (ANALPRAM-HC) 2.5-1 % rectal cream Place 1 Application rectally as needed for hemorrhoids or anal itching.   ketoconazole (NIZORAL) 2 % cream Apply between the third and fourth toe twice a day (Patient taking differently: Apply 1 Application topically 2 (two) times daily as needed for irritation (podiatry).)   levothyroxine (SYNTHROID) 100 MCG tablet TAKE ONE TABLET DAILY EXCEPT TAKE ONE-HALF (1/2) TABLET ON SUNDAY. SIX AND ONE-HALF TABLETS TOTAL PER WEEK   Lifitegrast (XIIDRA) 5 % SOLN Place 1 drop into both eyes 2 (two) times daily.   MAGNESIUM PO Take 125 mg by mouth daily.   MELATONIN PO Take 10  mg by mouth at bedtime.   Methylcellulose, Laxative, (CITRUCEL PO) Take 1 Dose by mouth 2 (two) times daily between meals as needed.   MULTIPLE VITAMIN PO Take 1 tablet by mouth in the morning.   Multiple Vitamins-Minerals (ZINC PO) Take 22 mg by mouth in the morning.   mupirocin cream (BACTROBAN) 2 % Apply 1 application topically 2 (two) times daily. (Patient taking differently: Apply 1 application  topically daily as needed (Podiatry).)   nitroGLYCERIN (NITRO-DUR) 0.2 mg/hr patch Place 1 patch (0.2  mg total) onto the skin daily. (Patient taking differently: Place 0.2 mg onto the skin daily as needed (When walking).)   NON FORMULARY Take 1 tablet by mouth daily. VSL live probiotic   OVER THE COUNTER MEDICATION Take 1.5-2 tablets by mouth at bedtime. gummy 55 mg of CBD 5 mg THC   OVER THE COUNTER MEDICATION Take 1 capsule by mouth 2 (two) times daily with a meal. Pepperment   thiamine (VITAMIN B-1) 100 MG tablet Take 100 mg by mouth daily.   triamcinolone cream (KENALOG) 0.5 % Apply 1 Application topically 2 (two) times daily.   TYRVAYA 0.03 MG/ACT SOLN Place 1 spray into both nostrils daily as needed (dry eyes).   No facility-administered encounter medications on file as of 10/25/2022.    Allergies (verified) Fluticasone, Mometasone, and Fexofenadine   History: Past Medical History:  Diagnosis Date   Allergy    Anxiety    COVID-19 virus infection    09-23-2019- 9-16 had monoclonal infusion    Depression    History of IBS    HPV (human papilloma virus) infection    Hyperlipidemia    82- not taking statin    Loss of taste    Neuropathy 2015   surgery for Mortons neuroma in 1985   Osteopenia    Raynaud's disease 02/2020   Tachycardia    Thyroid disease    Past Surgical History:  Procedure Laterality Date   BIOPSY  01/25/2022   Procedure: BIOPSY;  Surgeon: Lemar Lofty., MD;  Location: [facility];  Service: Gastroenterology;;   COLONOSCOPY  2019   hems only    ESOPHAGOGASTRODUODENOSCOPY (EGD) WITH PROPOFOL N/A 01/25/2022   Procedure: ESOPHAGOGASTRODUODENOSCOPY (EGD) WITH PROPOFOL;  Surgeon: Lemar Lofty., MD;  Location: [facility];  Service: Gastroenterology;  Laterality: N/A;   EUS N/A 01/25/2022   Procedure: UPPER ENDOSCOPIC ULTRASOUND (EUS) LINEAR;  Surgeon: Lemar Lofty., MD;  Location: [facility];  Service: Gastroenterology;  Laterality: N/A;   FOOT NEUROMA SURGERY  1985   WISDOM TOOTH EXTRACTION     Family History  Problem  Relation Age of Onset   Stroke Mother 71   Heart failure Mother    Heart attack Mother    Heart disease Mother    Heart attack Father 78   Heart disease Father    Heart disease Brother    Heart failure Brother        CHF   Heart disease Maternal Grandmother    Heart attack Maternal Grandfather    Diabetes Son        IDDM   Stroke Paternal Uncle    Thyroid disease Neg Hx    Stomach cancer Neg Hx    Esophageal cancer Neg Hx    Colon polyps Neg Hx    Colon cancer Neg Hx    Rectal cancer Neg Hx    Social History   Socioeconomic History   Marital status: Married    Spouse name: Leonette Most B. Milana Kidney  Number of children: 2   Years of education: MA   Highest education level: Not on file  Occupational History   Occupation: Retired    Comment: Editor, commissioning  Tobacco Use   Smoking status: Never   Smokeless tobacco: Never  Vaping Use   Vaping status: Never Used  Substance and Sexual Activity   Alcohol use: Not Currently   Drug use: No   Sexual activity: Not Currently  Other Topics Concern   Not on file  Social History Narrative   12/23/20 Patient lives at [facility] with [facility].   Caffeine Use: 1 cup daily   2 kids- 4 grandchildren- two boys and two girls      Social Determinants of Health   Financial Resource Strain: Low Risk  (10/25/2022)   Overall Financial Resource Strain (CARDIA)    Difficulty of Paying Living Expenses: Not hard at all  Food Insecurity: No Food Insecurity (10/25/2022)   Hunger Vital Sign    Worried About Running Out of Food in the Last Year: Never true    Ran Out of Food in the Last Year: Never true  Transportation Needs: No Transportation Needs (10/25/2022)   PRAPARE - Administrator, Civil Service (Medical): No    Lack of Transportation (Non-Medical): No  Physical Activity: Sufficiently Active (10/25/2022)   Exercise Vital Sign    Days of Exercise per Week: 6 days    Minutes of Exercise per Session: 60 min  Stress: No Stress  Concern Present (10/25/2022)   [employer] of Occupational Health - Occupational Stress Questionnaire    Feeling of Stress : Not at all  Social Connections: Socially Integrated (10/25/2022)   Social Connection and Isolation Panel [NHANES]    Frequency of Communication with Friends and Family: More than three times a week    Frequency of Social Gatherings with Friends and Family: More than three times a week    Attends Religious Services: More than 4 times per year    Active Member of [employer] or Organizations: Yes    Attends Engineer, structural: More than 4 times per year    Marital Status: Married    Tobacco Counseling Counseling given: Not Answered   Clinical Intake:  Pre-visit preparation completed: Yes  Pain : No/denies pain     Nutritional Risks: None Diabetes: No  How often do you need to have someone help you when you read instructions, pamphlets, or other written materials from your doctor or pharmacy?: 1 - Never  Interpreter Needed?: No  Information entered by :: Renie Ora, LPN   Activities of Daily Living    10/25/2022    8:07 AM 10/19/2022    1:07 PM  In your present state of health, do you have any difficulty performing the following activities:  Hearing? 0 0  Vision? 0 1  Difficulty concentrating or making decisions? 0 0  Walking or climbing stairs? 0 0  Dressing or bathing? 0 0  Doing errands, shopping? 0 0  Preparing Food and eating ? N N  Using the Toilet? N N  In the past six months, have you accidently leaked urine? N Y  Do you have problems with loss of bowel control? N Y  Managing your Medications? N N  Managing your Finances? N N  Housekeeping or managing your Housekeeping? N N    Patient Care Team: Excell Seltzer, MD as PCP - General (Family Medicine) Elinor Parkinson, DPM as Consulting Physician (Podiatry) Penumalli, [place],  MD as Consulting Physician (Neurology)  Indicate any recent Medical Services you may have  received from other than [facility] providers in the past year (date may be approximate).     Assessment:   This is a routine wellness examination for Jessica Miles.  Hearing/Vision screen Vision Screening - Comments:: Wears rx glasses - up to date with routine eye exams with  Dr.Brasington    Goals Addressed               This Visit's Progress     Stay Active (pt-stated)   On track     Continue to exercise.       Depression Screen    10/25/2022    8:06 AM 10/21/2021   11:26 AM 10/21/2021    8:56 AM 10/20/2020    8:31 AM 10/10/2019   10:03 AM 10/02/2018    2:18 PM 08/31/2017    8:41 AM  PHQ 2/9 Scores  PHQ - 2 Score 0 2 0 0 1 0 0  PHQ- 9 Score  3  3 3       Fall Risk    10/25/2022    8:04 AM 10/19/2022    1:07 PM 10/21/2021    8:58 AM 10/20/2020    8:33 AM 10/10/2019    9:49 AM  Fall Risk   Falls in the past year? 0 0 0 0 0  Number falls in past yr: 0  0 0   Injury with Fall? 0  0 0   Risk for fall due to : No Fall Risks  No Fall Risks    Follow up Falls prevention discussed  Falls prevention discussed      MEDICARE RISK AT HOME: Medicare Risk at Home Any stairs in or around the home?: No If so, are there any without handrails?: No Home free of loose throw rugs in walkways, pet beds, electrical cords, etc?: Yes Adequate lighting in your home to reduce risk of falls?: Yes Life alert?: No Use of a cane, walker or w/c?: No Grab bars in the bathroom?: Yes Shower chair or bench in shower?: Yes Elevated toilet seat or a handicapped toilet?: Yes  TIMED UP AND GO:  Was the test performed?  No    Cognitive Function:        10/25/2022    8:07 AM 10/21/2021    8:59 AM  6CIT Screen  What Year? 0 points 0 points  What month? 0 points 0 points  What time? 0 points 0 points  Count back from 20 0 points 0 points  Months in reverse 0 points 0 points  Repeat phrase 0 points 0 points  Total Score 0 points 0 points    Immunizations Immunization History  Administered  Date(s) Administered   DT (Pediatric) 01/11/2008   Fluad Quad(high Dose 65+) 10/02/2018, 10/10/2019, 10/21/2021   Influenza Split 10/25/2006   Influenza, High Dose Seasonal PF 10/21/2014, 10/12/2017, 10/11/2020   Influenza, Seasonal, Injecte, Preservative Fre 10/10/2015, 09/29/2016   Influenza-Unspecified 10/21/2011, 10/10/2013   Moderna SARS-COV2 Booster Vaccination 09/27/2020   PFIZER(Purple Top)SARS-COV-2 Vaccination 01/21/2019, 02/21/2019   Pneumococcal Conjugate-13 10/10/2013   Pneumococcal Polysaccharide-23 10/25/2006   Pneumococcal-Unspecified 01/11/2007   Td 08/12/2005   Zoster Recombinant(Shingrix) 08/17/2019, 12/25/2019   Zoster, Live 01/11/2008, 09/30/2008    TDAP status: Due, Education has been provided regarding the importance of this vaccine. Advised may receive this vaccine at local pharmacy or Health Dept. Aware to provide a copy of the vaccination record if obtained from local pharmacy or Health  Dept. Verbalized acceptance and understanding.  Flu Vaccine status: Up to date  Pneumococcal vaccine status: Up to date  Covid-19 vaccine status: Completed vaccines  Qualifies for Shingles Vaccine? Yes   Zostavax completed Yes   Shingrix Completed?: Yes  Screening Tests Health Maintenance  Topic Date Due   DTaP/Tdap/Td (3 - Tdap) 01/10/2018   Pneumonia Vaccine 95+ Years old (3 of 3 - PPSV23 or PCV20) 10/11/2018   COVID-19 Vaccine (3 - Pfizer risk series) 10/25/2020   MAMMOGRAM  10/17/2021   INFLUENZA VACCINE  08/11/2022   Medicare Annual Wellness (AWV)  10/25/2023   DEXA SCAN  Completed   Hepatitis C Screening  Completed   Zoster Vaccines- Shingrix  Completed   HPV VACCINES  Aged Out   Colonoscopy  Discontinued    Health Maintenance  Health Maintenance Due  Topic Date Due   DTaP/Tdap/Td (3 - Tdap) 01/10/2018   Pneumonia Vaccine 93+ Years old (3 of 3 - PPSV23 or PCV20) 10/11/2018   COVID-19 Vaccine (3 - Pfizer risk series) 10/25/2020   MAMMOGRAM  10/17/2021    INFLUENZA VACCINE  08/11/2022    Colorectal cancer screening: No longer required.   Mammogram status: No longer required due to age.  Bone Density status: Completed 10/22/2021. Results reflect: Bone density results: OSTEOPENIA. Repeat every 5 years.  Lung Cancer Screening: (Low Dose CT Chest recommended if Age 34-80 years, 20 pack-year currently smoking OR have quit w/in 15years.) does not qualify.   Lung Cancer Screening Referral: n/a  Additional Screening:  Hepatitis C Screening: does not qualify; Completed 10/19/2022  Vision Screening: Recommended annual ophthalmology exams for early detection of glaucoma and other disorders of the eye. Is the patient up to date with their annual eye exam?  Yes  Who is the provider or what is the name of the office in which the patient attends annual eye exams? Dr.Brasington  If pt is not established with a provider, would they like to be referred to a provider to establish care? No .   Dental Screening: Recommended annual dental exams for proper oral hygiene   Community Resource Referral / Chronic Care Management: CRR required this visit?  No   CCM required this visit?  No     Plan:     I have personally reviewed and noted the following in the patient's chart:   Medical and social history Use of alcohol, tobacco or illicit drugs  Current medications and supplements including opioid prescriptions. Patient is not currently taking opioid prescriptions. Functional ability and status Nutritional status Physical activity Advanced directives List of other physicians Hospitalizations, surgeries, and ER visits in previous 12 months Vitals Screenings to include cognitive, depression, and falls Referrals and appointments  In addition, I have reviewed and discussed with patient certain preventive protocols, quality metrics, and best practice recommendations. A written personalized care plan for preventive services as well as general  preventive health recommendations were provided to patient.     Lorrene Reid, LPN   [id number]   After Visit Summary: (MyChart) Due to this being a telephonic visit, the after visit summary with patients personalized plan was offered to patient via MyChart   Nurse Notes: none

## 2022-10-26 ENCOUNTER — Ambulatory Visit: Payer: Medicare Other | Admitting: Family Medicine

## 2022-10-26 ENCOUNTER — Encounter: Payer: Self-pay | Admitting: Family Medicine

## 2022-10-26 VITALS — BP 120/60 | HR 69 | Temp 97.8°F | Ht 66.0 in | Wt 108.0 lb

## 2022-10-26 DIAGNOSIS — I700 Atherosclerosis of aorta: Secondary | ICD-10-CM

## 2022-10-26 DIAGNOSIS — I471 Supraventricular tachycardia, unspecified: Secondary | ICD-10-CM | POA: Diagnosis not present

## 2022-10-26 DIAGNOSIS — E039 Hypothyroidism, unspecified: Secondary | ICD-10-CM

## 2022-10-26 DIAGNOSIS — K582 Mixed irritable bowel syndrome: Secondary | ICD-10-CM

## 2022-10-26 DIAGNOSIS — F410 Panic disorder [episodic paroxysmal anxiety] without agoraphobia: Secondary | ICD-10-CM

## 2022-10-26 DIAGNOSIS — F331 Major depressive disorder, recurrent, moderate: Secondary | ICD-10-CM

## 2022-10-26 DIAGNOSIS — M810 Age-related osteoporosis without current pathological fracture: Secondary | ICD-10-CM

## 2022-10-26 MED ORDER — BUSPIRONE HCL 5 MG PO TABS: 5.0000 mg | ORAL_TABLET | Freq: Two times a day (BID) | ORAL | 0 refills | Status: DC | PRN

## 2022-10-26 MED ORDER — ALENDRONATE SODIUM 70 MG PO TABS: 70.0000 mg | ORAL_TABLET | ORAL | 11 refills | Status: DC

## 2022-10-26 MED ORDER — LEVOTHYROXINE SODIUM 100 MCG PO TABS: 100.0000 ug | ORAL_TABLET | Freq: Every day | ORAL | Status: DC

## 2022-10-26 NOTE — Assessment & Plan Note (Signed)
Stable, chronic.  Continue current medication.  stable on levothyroxine 100 mcg p.o. daily

## 2022-10-26 NOTE — Assessment & Plan Note (Addendum)
She  Is not interetsed in talking statin given cholesterol at goal.  She reports she discussed this with Dr. Mariah Milling Cardiology.  Currently off  colestipol but may restart if GI issue return with addition of diet issues.

## 2022-10-26 NOTE — Assessment & Plan Note (Signed)
Acute worsening.. will try Buspar 5 mg BID prn panic attack. Continue Wellbutrin for depression.

## 2022-10-26 NOTE — Progress Notes (Signed)
Patient ID: Jessica Miles, female    DOB: 1945/03/16, 77 y.o.   MRN: [id number]  This visit was conducted in person.  BP 120/60 (BP Location: Left Arm, Patient Position: Sitting, Cuff Size: Normal)   Pulse 69   Temp 97.8 F (36.6 C) (Oral)   Ht 5\' 6"  (1.676 m)   Wt 108 lb (49 kg)   SpO2 99%   BMI 17.43 kg/m    CC: Chief Complaint  Patient presents with   Medicare Wellness    Part 2    Subjective:   HPI: Jessica Miles is a 77 y.o. female presenting on 10/26/2022 for AMW.  The patient presents for complete physical and review of chronic health problems. He/She also has the following acute concerns today:  GI issues/IBS: followed by Dr. Orvan Falconer and now with secondary consult with Dr. Almyra Deforest, in [place].  Seeing dietician.  MDD: Stable on bupropion ER 100 mg BID GAD/panic attacks:   She has been more anxious lately.. did have a panic attack last week... interested in having med like buspar she had in past.    10/26/2022    9:01 AM  GAD 7 : Generalized Anxiety Score  Nervous, Anxious, on Edge 0  Control/stop worrying 0  Worry too much - different things 0  Trouble relaxing 0  Restless 0  Easily annoyed or irritable 0  Afraid - awful might happen 0  Total GAD 7 Score 0  Anxiety Difficulty Not difficult at all        10/26/2022    9:00 AM 10/25/2022    8:06 AM 10/21/2021   11:26 AM 10/21/2021    8:56 AM 10/20/2020    8:31 AM  Depression screen PHQ 2/9  Decreased Interest 0 0 1 0 0  Down, Depressed, Hopeless 0 0 1 0 0  PHQ - 2 Score 0 0 2 0 0  Altered sleeping 0  0  0  Tired, decreased energy 0  1  3  Change in appetite 0  0  0  Feeling bad or failure about yourself  0  0  0  Trouble concentrating 0  0  0  Moving slowly or fidgety/restless 0  0  0  Suicidal thoughts 0  0  0  PHQ-9 Score 0  3  3  Difficult doing work/chores Not difficult at all  Not difficult at all  Very difficult      SVT: 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   Hypothyroid :  100 mcg  daily  of levothyroxine Lab Results  Component Value Date   TSH 2.51 10/21/2022    Aortic atherosclerosis: Not watching cholesterol in diet. Lab Results  Component Value Date   CHOL 216 (H) 10/21/2022   HDL 88.70 10/21/2022   LDLCALC 116 (H) 10/21/2022   TRIG 60.0 10/21/2022   CHOLHDL 2 10/21/2022   On colestipol for IBS.. held for last 2 weeks.   Diet: heart healthy Exercise:Avid biker  20 miles at a time, walks frequently.    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  Looking into anogenital manometry.   ALT elevated with recent labs.. has now stopped tylenol PM. No ETOH.  Relevant past medical, surgical, family and social history reviewed and updated as indicated. Interim medical history since our last visit reviewed. Allergies and medications reviewed and updated. Outpatient Medications Prior to Visit  Medication Sig Dispense Refill   ALPHA LIPOIC ACID PO Take 100 mg by  mouth daily.     ascorbic acid (VITAMIN C) 500 MG tablet Take 500 mg by mouth 2 (two) times daily.      atenolol (TENORMIN) 25 MG tablet Take 25 mg by mouth daily as needed (palpitations/pulse racing).     buPROPion ER (WELLBUTRIN SR) 100 MG 12 hr tablet TAKE 1 TABLET TWICE A DAY 180 tablet 1   Calcium Carb-Cholecalciferol (CALCIUM 600 + D PO) Take 1 tablet by mouth in the morning. 600 mg /400 mcg     Cholecalciferol (VITAMIN D-3 PO) Take 5,000 Units by mouth in the morning.     colestipol (COLESTID) 1 g tablet Take 2 tablets (2 g total) by mouth 2 (two) times daily. 360 tablet 3   Cyanocobalamin (VITAMIN B-12) 2500 MCG SUBL Take 2,500 mcg by mouth in the morning.     esomeprazole (NEXIUM) 40 MG capsule Take 1 capsule (40 mg total) by mouth daily. 30 capsule 1   hydrocortisone-pramoxine (ANALPRAM-HC) 2.5-1 % rectal cream Place 1 Application rectally as needed for hemorrhoids or anal itching. 30 g 1   ketoconazole (NIZORAL) 2 % cream Apply between the third  and fourth toe twice a day (Patient taking differently: Apply 1 Application topically 2 (two) times daily as needed for irritation (podiatry).) 60 g 2   Lifitegrast (XIIDRA) 5 % SOLN Place 1 drop into both eyes 2 (two) times daily.     MAGNESIUM PO Take 125 mg by mouth daily.     MELATONIN PO Take 10 mg by mouth at bedtime.     Methylcellulose, Laxative, (CITRUCEL PO) Take 1 Dose by mouth 2 (two) times daily between meals as needed.     MULTIPLE VITAMIN PO Take 1 tablet by mouth in the morning.     Multiple Vitamins-Minerals (ZINC PO) Take 22 mg by mouth in the morning.     mupirocin cream (BACTROBAN) 2 % Apply 1 application topically 2 (two) times daily. (Patient taking differently: Apply 1 application  topically daily as needed (Podiatry).) 15 g 2   nitroGLYCERIN (NITRO-DUR) 0.2 mg/hr patch Place 1 patch (0.2 mg total) onto the skin daily. (Patient taking differently: Place 0.2 mg onto the skin daily as needed (When walking).) 30 patch 12   NON FORMULARY Take 1 tablet by mouth daily. VSL live probiotic     OVER THE COUNTER MEDICATION Take 1.5-2 tablets by mouth at bedtime. gummy 55 mg of CBD 5 mg THC     OVER THE COUNTER MEDICATION Take 1 capsule by mouth 2 (two) times daily with a meal. Pepperment     thiamine (VITAMIN B-1) 100 MG tablet Take 100 mg by mouth daily.     triamcinolone cream (KENALOG) 0.5 % Apply 1 Application topically 2 (two) times daily. 30 g 0   TYRVAYA 0.03 MG/ACT SOLN Place 1 spray into both nostrils daily as needed (dry eyes).     levothyroxine (SYNTHROID) 100 MCG tablet TAKE ONE TABLET DAILY EXCEPT TAKE ONE-HALF (1/2) TABLET ON SUNDAY. SIX AND ONE-HALF TABLETS TOTAL PER WEEK 85 tablet 3   No facility-administered medications prior to visit.     Per HPI unless specifically indicated in ROS section below Review of Systems  Constitutional:  Negative for fatigue and fever.  HENT:  Negative for congestion.   Eyes:  Negative for pain.  Respiratory:  Negative for cough  and shortness of breath.   Cardiovascular:  Negative for chest pain, palpitations and leg swelling.  Gastrointestinal:  Negative for abdominal pain.  Genitourinary:  Negative  for dysuria and vaginal bleeding.  Musculoskeletal:  Negative for back pain.  Neurological:  Negative for syncope, light-headedness and headaches.  Psychiatric/Behavioral:  Negative for dysphoric mood.    Objective:  BP 120/60 (BP Location: Left Arm, Patient Position: Sitting, Cuff Size: Normal)   Pulse 69   Temp 97.8 F (36.6 C) (Oral)   Ht 5\' 6"  (1.676 m)   Wt 108 lb (49 kg)   SpO2 99%   BMI 17.43 kg/m   Wt Readings from Last 3 Encounters:  10/26/22 108 lb (49 kg)  10/25/22 106 lb (48.1 kg)  06/23/22 107 lb 4 oz (48.6 kg)      Physical Exam Constitutional:      General: She is not in acute distress.    Appearance: Normal appearance. She is well-developed. She is not ill-appearing or toxic-appearing.  HENT:     Head: Normocephalic.     Right Ear: Hearing, tympanic membrane, ear canal and external ear normal. Tympanic membrane is not erythematous, retracted or bulging.     Left Ear: Hearing, tympanic membrane, ear canal and external ear normal. Tympanic membrane is not erythematous, retracted or bulging.     Nose: No mucosal edema or rhinorrhea.     Right Sinus: No maxillary sinus tenderness or frontal sinus tenderness.     Left Sinus: No maxillary sinus tenderness or frontal sinus tenderness.     Mouth/Throat:     Pharynx: Uvula midline.  Eyes:     General: Lids are normal. Lids are everted, no foreign bodies appreciated.     Conjunctiva/sclera: Conjunctivae normal.     Pupils: Pupils are equal, round, and reactive to light.  Neck:     Thyroid: No thyroid mass or thyromegaly.     Vascular: No carotid bruit.     Trachea: Trachea normal.  Cardiovascular:     Rate and Rhythm: Normal rate and regular rhythm.     Pulses: Normal pulses.     Heart sounds: Normal heart sounds, S1 normal and S2 normal. No  murmur heard.    No friction rub. No gallop.  Pulmonary:     Effort: Pulmonary effort is normal. No tachypnea or respiratory distress.     Breath sounds: Normal breath sounds. No decreased breath sounds, wheezing, rhonchi or rales.  Abdominal:     General: Bowel sounds are normal.     Palpations: Abdomen is soft.     Tenderness: There is no abdominal tenderness.  Musculoskeletal:     Cervical back: Normal range of motion and neck supple.  Skin:    General: Skin is warm and dry.     Findings: No rash.  Neurological:     Mental Status: She is alert and oriented to person, place, and time.     GCS: GCS eye subscore is 4. GCS verbal subscore is 5. GCS motor subscore is 6.     Cranial Nerves: No cranial nerve deficit.     Sensory: No sensory deficit.     Motor: No abnormal muscle tone.     Coordination: Coordination normal.     Gait: Gait normal.     Deep Tendon Reflexes: Reflexes are normal and symmetric.     Comments: Nml cerebellar exam   No papilledema  Psychiatric:        Mood and Affect: Mood is not anxious or depressed.        Speech: Speech normal.        Behavior: Behavior normal. Behavior is cooperative.  Thought Content: Thought content normal.        Cognition and Memory: Memory is not impaired. She does not exhibit impaired recent memory or impaired remote memory.        Judgment: Judgment normal.       Results for orders placed or performed in visit on 10/21/22  TSH  Result Value Ref Range   TSH 2.51 0.35 - 5.50 uIU/mL  Lipid panel  Result Value Ref Range   Cholesterol 216 (H) 0 - 200 mg/dL   Triglycerides 16.1 0.0 - 149.0 mg/dL   HDL 09.60 >45.40 mg/dL   VLDL 98.1 0.0 - 19.1 mg/dL   LDL Cholesterol 478 (H) 0 - 99 mg/dL   Total CHOL/HDL Ratio 2    NonHDL 127.75   T3, free  Result Value Ref Range   T3, Free 2.6 2.3 - 4.2 pg/mL  T4, free  Result Value Ref Range   Free T4 0.96 0.60 - 1.60 ng/dL    This visit occurred during the SARS-CoV-2 public  health emergency.  Safety protocols were in place, including screening questions prior to the visit, additional usage of staff PPE, and extensive cleaning of exam room while observing appropriate contact time as indicated for disinfecting solutions.   COVID 19 screen:  No recent travel or known exposure to COVID19 The patient denies respiratory symptoms of COVID 19 at this time. The importance of social distancing was discussed today.   Assessment and Plan   The patient's preventative maintenance and recommended screening tests for an annual wellness exam were reviewed in full today. Brought up to date unless services declined.  Counselled on the importance of diet, exercise, and its role in overall health and mortality. The patient's FH and SH was reviewed, including their home life, tobacco status, and drug and alcohol status.   Vaccines: uptodate flu, S/P COVID vaccine series x 3 ,  per pt she has shingrix. Due for TDap but not  ;ecovered. ? PNA, got flu and COVID shots in last few weeks Pap/DVE: not indicated Mammo: 10/2021  nml, repeat q2 years. Bone Density: 2015 osteopenia, 10/23 osteoporosis.Marland Kitchen will restart fosamax and recheck in 1 year. Colon:2022, no further indicated  Smoking Status:none ETOH/ drug GNF:AOZH/YQMV  Hep C: neg  Problem List Items Addressed This Visit     Aortic atherosclerosis (HCC)     She  Is not interetsed in talking statin given cholesterol at goal.  She reports she discussed this with Dr. Mariah Milling [facility]iology.  Currently off  colestipol but may restart if GI issue return with addition of diet issues.      Hypothyroidism - Primary    Stable, chronic.  Continue current medication.  stable on levothyroxine 100 mcg p.o. daily      Relevant Medications   levothyroxine (SYNTHROID) 100 MCG tablet   IBS (irritable bowel syndrome)     Chronic, trying to see how diet additions work when off colestipol.      Major depressive disorder, recurrent episode,  moderate (HCC)    Stable, chronic.  Continue current medication.  Wellbutrin SR 100 mg 1 tablet twice daily      Relevant Medications   busPIRone (BUSPAR) 5 MG tablet   Osteoporosis      Continued decline.Marland Kitchen  add  back fosamax.. recehck DEX in 1 year.      Relevant Medications   alendronate (FOSAMAX) 70 MG tablet   Panic attacks     Acute worsening.. will try Buspar 5 mg BID prn panic  attack. Continue Wellbutrin for depression.      Relevant Medications   busPIRone (BUSPAR) 5 MG tablet   SVT (supraventricular tachycardia) (HCC)    Chronic, stable control on tenormin prn       Kerby Nora, MD

## 2022-10-26 NOTE — Assessment & Plan Note (Signed)
Continued decline.Marland Kitchen  add  back fosamax.. recehck DEX in 1 year.

## 2022-10-26 NOTE — Assessment & Plan Note (Signed)
Stable, chronic.  Continue current medication.  Wellbutrin SR 100 mg 1 tablet twice daily

## 2022-10-26 NOTE — Assessment & Plan Note (Signed)
Chronic, trying to see how diet additions work when off colestipol.

## 2022-10-26 NOTE — Patient Instructions (Signed)
Use Buspar ad needed for anxiety. Stay on levothyroxine 100 mcg p.o. every day. Work on low-cholesterol diet and heart healthy diet off of cholesterol.  Recheck labs in 3 months. Restart Fosamax with reflux precautions weekly.

## 2022-10-26 NOTE — Assessment & Plan Note (Signed)
Chronic, stable control on tenormin prn

## 2022-11-09 ENCOUNTER — Other Ambulatory Visit: Payer: Self-pay | Admitting: Family Medicine

## 2022-11-09 DIAGNOSIS — F331 Major depressive disorder, recurrent, moderate: Secondary | ICD-10-CM

## 2022-11-09 MED ORDER — BUSPIRONE HCL 5 MG PO TABS: 5.0000 mg | ORAL_TABLET | Freq: Two times a day (BID) | ORAL | 0 refills | Status: DC | PRN

## 2022-11-09 NOTE — Addendum Note (Signed)
Addended by: Damita Lack on: 11/09/2022 01:54 PM   Modules accepted: Orders

## 2022-12-16 ENCOUNTER — Encounter: Payer: Self-pay | Admitting: Gastroenterology

## 2022-12-16 ENCOUNTER — Ambulatory Visit (INDEPENDENT_AMBULATORY_CARE_PROVIDER_SITE_OTHER): Payer: Medicare Other | Admitting: Gastroenterology

## 2022-12-16 VITALS — BP 110/64 | HR 78 | Ht 66.5 in | Wt 112.0 lb

## 2022-12-16 DIAGNOSIS — K581 Irritable bowel syndrome with constipation: Secondary | ICD-10-CM

## 2022-12-16 DIAGNOSIS — K6289 Other specified diseases of anus and rectum: Secondary | ICD-10-CM

## 2022-12-16 DIAGNOSIS — R159 Full incontinence of feces: Secondary | ICD-10-CM | POA: Diagnosis not present

## 2022-12-16 NOTE — Progress Notes (Signed)
Chief Complaint: referral for anorectal manometry Primary GI MD: (Dr. Orvan Falconer) - requesting Dr. Meridee Score.  HPI: 77 year old female with longstanding history of IBS, hypothyroidism, and others as listed below presents for evaluation of referral for anorectal manometry  History of longstanding constipation IBS diagnosed in 2015 and treated with a FODMAP diet.  Please see Dr. Orvan Falconer previous note for extensive details and scanned notes from Dr. Almyra Deforest.  March 2022 seen in the office for symptomatic diverticulosis +/- SIBO symptoms.  Patient was recommended to continue FODMAP diet and VSL probiotics.  Given a trial of Xifaxan 550 Mg 3 times daily x 2 weeks and told to add to Citrucel.  April 2023 seen for diarrhea with negative stool studies but improvement after her Xifaxan trial and dicyclomine.  Switch diet from low FODMAP to SIBO after reading online.  October 2023 stool negative for pancreatic elastase  Last seen February 2024 by Dr. Orvan Falconer and at that time patient had submitted Trio smart breath test on her own.  Report was negative for SIBO.  At her request she wanted another round of Xifaxan 550 Mg 3 times daily x 14 days.  However, her symptoms escalated thought to be secondary to the side effects of Xifaxan.  At her last office visit she was continuing to have postprandial diarrhea, gas, eructation.    Has tried to submit testing for IBS-smart through [facility] and was also considering medical marijuana at that time.  She requested second opinion at [facility] but the referral was declined so Dr. Orvan Falconer personally reached out to Dr. Carmon Sails which was declined. Patient ultimately got referred to Dr. Almyra Deforest to work in consultation with Dr. Orvan Falconer.  Gastrin, calcitonin, VIP, and 5 HIAA were normal.  Pancreatic elastase normal x 2.  Patient was recommended to try colestipol and titrate, peppermint oil, ginger at bedtime.  She has also tried [place], gut brain therapy, another round  of rifaximin in late October 2024.  Patient states she did extensive research and combined a SIBO diet with a low FODMAP diet which has worked well for her.  Although she states now she occasionally has episodes of fecal incontinence in which she will be walking and some stool will involuntarily come out.  She states more often than not her stools are soft and formed but can sometimes be hard.  She manages this with diet and water.  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  She is here today to get set up for ano-rectal manometry at the recommendation of Dr. Almyra Deforest.  She would like to discontinue her care with him and come back to [facility].  She request to see Dr. Meridee Score in Dr. Orvan Falconer absence.  Ultimately she had extensive workup with Dr. Lucretia Field including [employer] testing and set up for pelvic floor physical therapy for both her GI symptoms and urinary symptoms which has been set up through urogyn.  She has not started pelvic floor physical therapy.  She has had 2 children in the past.   PREVIOUS GI WORKUP   CT of the abdomen and pelvis 12/16/2019 showed abnormal wall thickening in the distal transverse colon, descending colon, sigmoid colon, and parts of the ascending colon with focal wall thickening in the ascending colon worrisome for the possibility of malignancy. A prominent pancreatic duct which appears slightly more prominent than the 2012  exam was also noted.   Colonoscopy 03/06/20 showed sigmoid diverticulosis, tortuous colon and small internal hemorrhoids.   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 No evidence of pancreatic mass or other obstructing etiology. Gallbladder sludge, without evidence of cholecystitis or biliary ductal dilatation.  CT abd/pelvis with contrast 11/02/21 showed stable and  nonspecific pancreatic duct dilation. The pancreas otherwise appears normal. No acute intra-abdominal or intrapelvic process identified.   EUS 01/25/22 with Dr. Meridee Score was negative for worrisome pancreas findings.   Gastrin, calcitonin, VIP, and 5 HIAA were normal.  Pancreatic elastase normal x 2.  Past Medical History:  Diagnosis Date   Allergy    Anxiety    COVID-19 virus infection    09-23-2019- 9-16 had monoclonal infusion    Depression    History of IBS    HPV (human papilloma virus) infection    Hyperlipidemia    82- not taking statin    Loss of taste    Neuropathy 2015   surgery for Mortons neuroma in 1985   Osteopenia    Raynaud's disease 02/2020   Tachycardia    Thyroid disease     Past Surgical History:  Procedure Laterality Date   BIOPSY  01/25/2022   Procedure: BIOPSY;  Surgeon: Lemar Lofty., MD;  Location: [facility];  Service: Gastroenterology;;   COLONOSCOPY  2019   hems only    ESOPHAGOGASTRODUODENOSCOPY (EGD) WITH PROPOFOL N/A 01/25/2022   Procedure: ESOPHAGOGASTRODUODENOSCOPY (EGD) WITH PROPOFOL;  Surgeon: Lemar Lofty., MD;  Location: [facility];  Service: Gastroenterology;  Laterality: N/A;   EUS N/A 01/25/2022   Procedure: UPPER ENDOSCOPIC ULTRASOUND (EUS) LINEAR;  Surgeon: Lemar Lofty., MD;  Location: [facility];  Service: Gastroenterology;  Laterality: N/A;   FOOT NEUROMA SURGERY  1985   WISDOM TOOTH EXTRACTION      Current Outpatient Medications  Medication Sig Dispense Refill   alendronate (FOSAMAX) 70 MG tablet Take 1 tablet (70 mg total) by mouth every 7 (seven) days. Take with a full glass of water on an empty stomach. 4 tablet 11   ALPHA LIPOIC ACID PO Take 100 mg by mouth daily.     ascorbic acid (VITAMIN C) 500 MG tablet Take 500 mg by mouth 2 (two) times daily.      atenolol (TENORMIN) 25 MG tablet Take 25 mg by mouth daily as needed (palpitations/pulse racing).     buPROPion ER (WELLBUTRIN SR) 100 MG  12 hr tablet TAKE 1 TABLET TWICE A DAY 180 tablet 3   busPIRone (BUSPAR) 5 MG tablet Take 1 tablet (5 mg total) by mouth 2 (two) times daily as needed (anxiety). 180 tablet 0   Calcium Carb-Cholecalciferol (CALCIUM 600 + D PO) Take 1 tablet by mouth in the morning. 600 mg /400 mcg     Cholecalciferol (VITAMIN D-3 PO) Take 5,000 Units by mouth in the morning.     colestipol (COLESTID) 1 g tablet Take 2 tablets (2 g total) by mouth 2 (two) times daily. 360 tablet 3   Cyanocobalamin (VITAMIN B-12) 2500 MCG SUBL Take 2,500 mcg by mouth in the morning.     esomeprazole (NEXIUM) 40 MG capsule Take 1 capsule (40 mg total) by mouth daily. 30 capsule 1   hydrocortisone-pramoxine (ANALPRAM-HC) 2.5-1 % rectal cream Place 1 Application rectally as needed for hemorrhoids or anal itching. 30 g 1   ketoconazole (NIZORAL) 2 % cream Apply between the third and fourth toe twice a day (Patient taking differently:  Apply 1 Application topically 2 (two) times daily as needed for irritation (podiatry).) 60 g 2   levothyroxine (SYNTHROID) 100 MCG tablet Take 1 tablet (100 mcg total) by mouth daily.     Lifitegrast (XIIDRA) 5 % SOLN Place 1 drop into both eyes 2 (two) times daily.     MAGNESIUM PO Take 125 mg by mouth daily.     MELATONIN PO Take 10 mg by mouth at bedtime.     Methylcellulose, Laxative, (CITRUCEL PO) Take 1 Dose by mouth 2 (two) times daily between meals as needed.     MULTIPLE VITAMIN PO Take 1 tablet by mouth in the morning.     Multiple Vitamins-Minerals (ZINC PO) Take 22 mg by mouth in the morning.     mupirocin cream (BACTROBAN) 2 % Apply 1 application topically 2 (two) times daily. (Patient taking differently: Apply 1 application  topically daily as needed (Podiatry).) 15 g 2   nitroGLYCERIN (NITRO-DUR) 0.2 mg/hr patch Place 1 patch (0.2 mg total) onto the skin daily. (Patient taking differently: Place 0.2 mg onto the skin daily as needed (When walking).) 30 patch 12   NON FORMULARY Take 1 tablet by  mouth daily. VSL live probiotic     OVER THE COUNTER MEDICATION Take 1.5-2 tablets by mouth at bedtime. gummy 55 mg of CBD 5 mg THC     OVER THE COUNTER MEDICATION Take 1 capsule by mouth 2 (two) times daily with a meal. Pepperment     thiamine (VITAMIN B-1) 100 MG tablet Take 100 mg by mouth daily.     triamcinolone cream (KENALOG) 0.5 % Apply 1 Application topically 2 (two) times daily. 30 g 0   TYRVAYA 0.03 MG/ACT SOLN Place 1 spray into both nostrils daily as needed (dry eyes).     No current facility-administered medications for this visit.    Allergies as of 12/16/2022 - Review Complete 10/26/2022  Allergen Reaction Noted   Fluticasone Other (See Comments) 03/13/2015   Mometasone Other (See Comments) 03/13/2015   Fexofenadine Palpitations 07/22/2013    Family History  Problem Relation Age of Onset   Stroke Mother 61   Heart failure Mother    Heart attack Mother    Heart disease Mother    Heart attack Father 13   Heart disease Father    Heart disease Brother    Heart failure Brother        CHF   Heart disease Maternal Grandmother    Heart attack Maternal Grandfather    Diabetes Son        IDDM   Stroke Paternal Uncle    Thyroid disease Neg Hx    Stomach cancer Neg Hx    Esophageal cancer Neg Hx    Colon polyps Neg Hx    Colon cancer Neg Hx    Rectal cancer Neg Hx     Social History   Socioeconomic History   Marital status: Married    Spouse name: Leonette Most B. Hortman   Number of children: 2   Years of education: MA   Highest education level: Not on file  Occupational History   Occupation: Retired    Comment: Editor, commissioning  Tobacco Use   Smoking status: Never   Smokeless tobacco: Never  Vaping Use   Vaping status: Never Used  Substance and Sexual Activity   Alcohol use: Not Currently   Drug use: No   Sexual activity: Not Currently  Other Topics Concern   Not on file  Social History  Narrative   12/23/20 Patient lives at [facility] with spouse.    Caffeine Use: 1 cup daily   2 kids- 4 grandchildren- two boys and two girls      Social Determinants of Health   Financial Resource Strain: Low Risk  (10/25/2022)   Overall Financial Resource Strain (CARDIA)    Difficulty of Paying Living Expenses: Not hard at all  Food Insecurity: No Food Insecurity (10/25/2022)   Hunger Vital Sign    Worried About Running Out of Food in the Last Year: Never true    Ran Out of Food in the Last Year: Never true  Transportation Needs: No Transportation Needs (10/25/2022)   PRAPARE - Administrator, Civil Service (Medical): No    Lack of Transportation (Non-Medical): No  Physical Activity: Sufficiently Active (10/25/2022)   Exercise Vital Sign    Days of Exercise per Week: 6 days    Minutes of Exercise per Session: 60 min  Stress: No Stress Concern Present (10/25/2022)   [employer] of Occupational Health - Occupational Stress Questionnaire    Feeling of Stress : Not at all  Social Connections: Socially Integrated (10/25/2022)   Social Connection and Isolation Panel [NHANES]    Frequency of Communication with Friends and Family: More than three times a week    Frequency of Social Gatherings with Friends and Family: More than three times a week    Attends Religious Services: More than 4 times per year    Active Member of [employer] or Organizations: Yes    Attends Engineer, structural: More than 4 times per year    Marital Status: Married  Catering manager Violence: Not At Risk (10/25/2022)   Humiliation, Afraid, Rape, and Kick questionnaire    Fear of Current or Ex-Partner: No    Emotionally Abused: No    Physically Abused: No    Sexually Abused: No    Review of Systems:    Constitutional: No weight loss, fever, chills, weakness or fatigue HEENT: Eyes: No change in vision               Ears, Nose, Throat:  No change in hearing or congestion Skin: No rash or itching Cardiovascular: No chest pain, chest pressure or  palpitations   Respiratory: No SOB or cough Gastrointestinal: See HPI and otherwise negative Genitourinary: No dysuria or change in urinary frequency Neurological: No headache, dizziness or syncope Musculoskeletal: No new muscle or joint pain Hematologic: No bleeding or bruising Psychiatric: No history of depression or anxiety    Physical Exam:  Vital signs: There were no vitals taken for this visit.  Constitutional: NAD, Well developed, Well nourished, alert and cooperative Head:  Normocephalic and atraumatic. Eyes:   PEERL, EOMI. No icterus. Conjunctiva pink. Respiratory: Respirations even and unlabored. Lungs clear to auscultation bilaterally.   No wheezes, crackles, or rhonchi.  Cardiovascular:  Regular rate and rhythm. No peripheral edema, cyanosis or pallor.  Gastrointestinal:  Soft, nondistended, nontender. No rebound or guarding. Normal bowel sounds. No appreciable masses or hepatomegaly. Rectal:  Not performed. Declined by patient. Msk:  Symmetrical without gross deformities. Without edema, no deformity or joint abnormality.  Neurologic:  Alert and  oriented x4;  grossly normal neurologically.  Skin:   Dry and intact without significant lesions or rashes. Psychiatric: Oriented to person, place and time. Demonstrates good judgement and reason without abnormal affect or behaviors.  Physical Exam           RELEVANT LABS AND IMAGING:  CBC    Component Value Date/Time   WBC 5.3 01/19/2022 0738   RBC 4.73 01/19/2022 0738   HGB 14.6 01/19/2022 0738   HCT 42.7 01/19/2022 0738   PLT 298.0 01/19/2022 0738   MCV 90.3 01/19/2022 0738   MCHC 34.2 01/19/2022 0738   RDW 12.7 01/19/2022 0738   LYMPHSABS 1.0 01/19/2022 0738   MONOABS 0.3 01/19/2022 0738   EOSABS 0.0 01/19/2022 0738   BASOSABS 0.0 01/19/2022 0738    CMP     Component Value Date/Time   NA 140 01/19/2022 0738   K 4.1 01/19/2022 0738   CL 102 01/19/2022 0738   CO2 23 01/19/2022 0738   GLUCOSE 98 01/19/2022  0738   BUN 16 01/19/2022 0738   CREATININE 1.02 01/19/2022 0738   CALCIUM 9.7 01/19/2022 0738   PROT 6.5 10/19/2022 0741   PROT 7.3 08/20/2013 1005   ALBUMIN 4.4 10/19/2022 0741   AST 37 10/19/2022 0741   ALT 50 (H) 10/19/2022 0741   ALKPHOS 94 10/19/2022 0741   BILITOT 0.6 10/19/2022 0741     Assessment/Plan:   77 year old female with longstanding history of IBS with very extensive negative workup including Gastrin, calcitonin, VIP, and 5 HIAA were normal.  Pancreatic elastase normal x 2.  Negative SIBO test.  Negative CT scan.  Colonoscopy overall normal besides torturous colon.  She has tried many medications including several trials of Xifaxan 3 times daily for 14 days, dicyclomine, Citrucel, Savella, and gut brain therapy (she does admit anxiety history which attributes to her gut symptoms).  Has been seen by Dr. Almyra Deforest ([facility] who does not take insurance). Ultimately she has seen the most improvement with a specific carbohydrate diet in which she researched that correlates with SIBO in addition to a low FODMAP diet.  She overall has some improvement but has been having some episodes of fecal incontinence and hard stools and has been told she has a weak sphincter tone in addition to urologic issues and she is set up to see pelvic floor physical therapy.  Patient would prefer to avoid medications if possible and continue treatment through nutrition services in which she is following with.  - Continue with plans for pelvic floor physical therapy - We will schedule anorectal manometry per request - Continue following up with nutrition and following diet that seems to be providing most results for patient - Continue to stay active increase water, increase fiber   This visit required 98 minutes of patient care (this includes precharting, chart review, review of results, face-to-face time used for counseling as well as treatment plan and follow-up. The patient was  provided an opportunity to ask questions and all were answered. The patient agreed with the plan and demonstrated an understanding of the instructions.   Boone Master, PA-C [facility] 12/16/2022, 8:22 AM  Cc: Excell Seltzer, MD

## 2022-12-16 NOTE — Patient Instructions (Signed)
You have been scheduled to have an anorectal manometry at [facility] on 05/03/2023 at 8:30 am. Please arrive 30 minutes prior to your appointment time for registration (1st floor of the hospital-admissions).  Please make certain to use 1 Fleets enema 2 hours prior to coming for your appointment. You can purchase Fleets enemas from the laxative section at your drug store. You should not eat anything during the two hours prior to the procedure. You may take regular medications with small sips of water at least 2 hours prior to the study.  Anorectal manometry is a test performed to evaluate patients with constipation or fecal incontinence. This test measures the pressures of the anal sphincter muscles, the sensation in the rectum, and the neural reflexes that are needed for normal bowel movements.  THE PROCEDURE The test takes approximately 30 minutes to 1 hour. You will be asked to change into a hospital gown. A technician or nurse will explain the procedure to you, take a brief health history, and answer any questions you may have. The patient then lies on his or her left side. A small, flexible tube, about the size of a thermometer, with a balloon at the end is inserted into the rectum. The catheter is connected to a machine that measures the pressure. During the test, the small balloon attached to the catheter may be inflated in the rectum to assess the normal reflex pathways. The nurse or technician may also ask the person to squeeze, relax, and push at various times. The anal sphincter muscle pressures are measured during each of these maneuvers. To squeeze, the patient tightens the sphincter muscles as if trying to prevent anything from coming out. To push or bear down, the patient strains down as if trying to have a bowel movement.   Due to recent changes in healthcare laws, you may see the results of your imaging and laboratory studies on MyChart before your provider has had a chance to  review them.  We understand that in some cases there may be results that are confusing or concerning to you. Not all laboratory results come back in the same time frame and the provider may be waiting for multiple results in order to interpret others.  Please give [place] 48 hours in order for your provider to thoroughly review all the results before contacting the office for clarification of your results.    I appreciate the  opportunity to care for you  Thank You   [facility]

## 2022-12-17 NOTE — Progress Notes (Signed)
Attending Physician's Attestation   I have reviewed the chart.   I agree with the Advanced Practitioner's note, impression, and recommendations with any updates as below. I remember this patient from recent upper EUS.  Her extensive workup is well-documented.  I am not sure I have much else to offer but seems like she has made significant improvement with Dr. Almyra Deforest.  Would be ideal for [place] to have all of his records scanned into the media chart for follow-up.  Pelvic floor biofeedback is reasonable, but anorectal manometry can certainly be pursued based on her symptomatology described.  If she continues to have significant issues we can try to sort through things versus her returning to Dr. Almyra Deforest.  I am okay to accept her back to the [facility] practice.   Corliss Parish, MD [facility]oscopy Office # [phone]

## 2023-01-10 ENCOUNTER — Telehealth: Payer: Self-pay | Admitting: *Deleted

## 2023-01-10 DIAGNOSIS — E7849 Other hyperlipidemia: Secondary | ICD-10-CM

## 2023-01-10 DIAGNOSIS — R7401 Elevation of levels of liver transaminase levels: Secondary | ICD-10-CM

## 2023-01-10 NOTE — Telephone Encounter (Signed)
-----   Message from Alvina Chou sent at 01/09/2023  3:56 PM EST ----- Regarding: lab orders for [facility], 1.15.25 Lab orders for f/u labs, thanks

## 2023-01-24 ENCOUNTER — Other Ambulatory Visit: Payer: Self-pay

## 2023-01-24 ENCOUNTER — Ambulatory Visit: Payer: Medicare Other | Attending: Obstetrics and Gynecology

## 2023-01-24 DIAGNOSIS — M5459 Other low back pain: Secondary | ICD-10-CM | POA: Diagnosis not present

## 2023-01-24 DIAGNOSIS — M6281 Muscle weakness (generalized): Secondary | ICD-10-CM | POA: Insufficient documentation

## 2023-01-24 DIAGNOSIS — M25552 Pain in left hip: Secondary | ICD-10-CM | POA: Insufficient documentation

## 2023-01-24 DIAGNOSIS — R279 Unspecified lack of coordination: Secondary | ICD-10-CM | POA: Insufficient documentation

## 2023-01-24 DIAGNOSIS — R102 Pelvic and perineal pain: Secondary | ICD-10-CM | POA: Insufficient documentation

## 2023-01-24 DIAGNOSIS — M7918 Myalgia, other site: Secondary | ICD-10-CM | POA: Diagnosis present

## 2023-01-24 DIAGNOSIS — R293 Abnormal posture: Secondary | ICD-10-CM | POA: Insufficient documentation

## 2023-01-24 DIAGNOSIS — M62838 Other muscle spasm: Secondary | ICD-10-CM | POA: Diagnosis not present

## 2023-01-24 DIAGNOSIS — G588 Other specified mononeuropathies: Secondary | ICD-10-CM | POA: Insufficient documentation

## 2023-01-24 NOTE — Therapy (Signed)
 OUTPATIENT PHYSICAL THERAPY FEMALE PELVIC EVALUATION   Patient Name: Jessica Miles MRN: [id number] DOB:[birth year], 78 y.o., female Today's Date: 01/24/2023  END OF SESSION:  PT End of Session - 01/24/23 1109     Visit Number 1    Date for PT Re-Evaluation 03/21/23    Authorization Type Medicare - needs Kx at 15    PT Start Time 1100    PT Stop Time 1140    PT Time Calculation (min) 40 min    Activity Tolerance Patient tolerated treatment well    Behavior During Therapy [place] for tasks assessed/performed             Past Medical History:  Diagnosis Date   Allergy    Anxiety    COVID-19 virus infection    09-23-2019- 9-16 had monoclonal infusion    Depression    History of IBS    HPV (human papilloma virus) infection    Hyperlipidemia    83- not taking statin    Loss of taste    Neuropathy 2015   surgery for Mortons neuroma in 1985   Osteopenia    Osteoporosis    Raynaud's disease 02/2020   Tachycardia    Thyroid  disease    Past Surgical History:  Procedure Laterality Date   BIOPSY  01/25/2022   Procedure: BIOPSY;  Surgeon: Wilhelmenia Aloha Raddle., MD;  Location: [facility];  Service: Gastroenterology;;   COLONOSCOPY  2019   hems only    ESOPHAGOGASTRODUODENOSCOPY (EGD) WITH PROPOFOL  N/A 01/25/2022   Procedure: ESOPHAGOGASTRODUODENOSCOPY (EGD) WITH PROPOFOL ;  Surgeon: Wilhelmenia Aloha Raddle., MD;  Location: [facility];  Service: Gastroenterology;  Laterality: N/A;   EUS N/A 01/25/2022   Procedure: UPPER ENDOSCOPIC ULTRASOUND (EUS) LINEAR;  Surgeon: Wilhelmenia Aloha Raddle., MD;  Location: [facility];  Service: Gastroenterology;  Laterality: N/A;   FOOT NEUROMA SURGERY  1985   WISDOM TOOTH EXTRACTION     Patient Active Problem List   Diagnosis Date Noted   Panic attacks 10/26/2022   Allergic reaction to insect bite 06/23/2022   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   Severely underweight adult 09/07/2021   Acute diarrhea 04/27/2021   Inflamed sebaceous cyst 02/11/2021    Transient disorientation 10/20/2020   Memory change 08/05/2020   Family history of early CAD 02/27/2020   Tremor 02/27/2020   Aortic atherosclerosis (HCC) 12/19/2019   Urinary urgency 05/21/2019   Bladder irritation 05/21/2019   External hemorrhoid 05/21/2019   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   Raynaud's syndrome 02/09/2019   Osteoarthritis of thumb, 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   Fatigue 12/19/2017   Acute left-sided low back pain without sciatica 12/19/2017   SVT (supraventricular tachycardia) (HCC) 08/31/2017   Peripheral neuropathy 08/31/2017   Dry eyes 09/21/2015   Atopic dermatitis 03/13/2015   IBS (irritable bowel syndrome) 03/18/2014   Osteoporosis 03/18/2014   Major depressive disorder, recurrent episode, moderate (HCC) 03/18/2014   Hypothyroidism 12/14/2013    PCP: Avelina Greig BRAVO, MD  REFERRING PROVIDER: Darol Norris, MD  REFERRING DIAG: M79.18 (ICD-10-CM) - Myalgia, other site G58.8 (ICD-10-CM) - Other specified mononeuropathies  THERAPY DIAG:  Other muscle spasm  Pelvic pain  Pain in left hip  Other low back pain  Muscle weakness (generalized)  Abnormal posture  Unspecified lack of coordination  Rationale for Evaluation and Treatment: Rehabilitation  ONSET DATE: 3 years   SUBJECTIVE:  SUBJECTIVE STATEMENT: Patient states that 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 She states that she has also been treatment for some hip pain and just recently had MRI. She started doing her previous pelvic floor muscle exercises and other activities: 3-5 miles every morning walking, 2 days of core, 2 days of weight training, and 3 days of yoga. She is wondering if yoga has triggered her hip pain.    PAIN:  Are you having pain? Yes NPRS scale: 5/10 Lt hip pain;  4/10 Pain location:  Lt hip and perineum/vulva  Pain type: soreness Pain description: intermittent   Aggravating factors: sitting Relieving factors: lying flat, sitting on toilet  PRECAUTIONS: Other: large mass in Rt lower quadrant that has not been treated by medical team  RED FLAGS: None   WEIGHT BEARING RESTRICTIONS: No  FALLS:  Has patient fallen in last 6 months? No  LIVING ENVIRONMENT: Lives with: lives with their family Lives in: House/apartment  OCCUPATION: retired runner, broadcasting/film/video  PLOF: Independent  PATIENT GOALS: decrease pain  PERTINENT HISTORY:  Interstitial cystitis  BOWEL MOVEMENT: Pain with bowel movement: No Type of bowel movement:Frequency 1x/day and Strain No Fully empty rectum: No Leakage: No - none currently Pads: No Fiber supplement: No  URINATION: Pain with urination: No Fully empty bladder: Yes: - Stream: Strong Urgency: Yes: have to run  Frequency: regular Leakage:  none Pads: No  INTERCOURSE: NA  PREGNANCY: Vaginal deliveries 2 Tearing No C-section deliveries 0 Currently pregnant No  PROLAPSE: None   OBJECTIVE:  Note: Objective measures were completed at Evaluation unless otherwise noted.  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, swelling of quadratus femoris muscle, tendinopathic and frayed hamstring origin Scheduled for anal manometry in April 2025   COGNITION: Overall cognitive status: Within functional limits for tasks assessed     SENSATION: Light touch: Appears intact Proprioception: Appears intact   FUNCTIONAL/SPECIAL TESTS:  Single leg stance:  Rt: more stable, some Lt drop - compensated trendelenburg  Lt: less stable, more Rt drop - compensated trendelenburg Squat: Lt weight shift, Rt valgus knee collapse FAIR: (+) Lt hip Hamstring length: significant decrease with pain on Lt, -75 degrees Curl-up test: breath holding, but no distortion, 2  finger width diastasis in upper abdominals Bed mobility: breath holding  Palpable transversus abdominus contraction bil, but weak Tenderness at Lt ischial tuberosity and hamstrings/Lt posterolateral hip mm  GAIT: Comments: WNL  POSTURE: forward head, decreased lumbar lordosis, posterior pelvic tilt, and Lt lower thoracic/upper lumbar curvature, elevated Lt iliac crest  PELVIC ALIGNMENT: Lt posterior rotation, Lt sacral facing rotation   LUMBARAROM/PROM:  A/PROM A/PROM  Eval (% available)  Flexion 75, pain on LT  Extension 75  Right lateral flexion 100  Left lateral flexion 100, pain in back and Lt pelvis  Right rotation 75  Left rotation 50, feels good on back   (Blank rows = not tested)   LOWER EXTREMITY MMT:  MMT Right eval Left eval  Hip abduction 5/5 5/5  Hip adduction    Hip internal rotation 3/5 4-/5  Hip external rotation 3/5 4/5   PALPATION:   General: Large mass located in Rt abdomen lateral to umbilicus, non-tender, but with pressure it caused discomfort in low back - patient had not noticed before and reports no doctor has mentioned to her before                External Perineal Exam deferred  Internal Pelvic Floor deferred  Patient confirms identification and approves PT to assess internal pelvic floor and treatment Yes deferred  PELVIC MMT: deferred   MMT eval  Vaginal   Internal Anal Sphincter   External Anal Sphincter   Puborectalis   Diastasis Recti   (Blank rows = not tested)        TONE: deferred  PROLAPSE: deferred  TODAY'S TREATMENT:                                                                                                                              DATE:  01/24/23  EVAL    PATIENT EDUCATION:  Education details: See above Person educated: Patient Education method: Programmer, Multimedia, Demonstration, Actor cues, Verbal cues, and Handouts Education comprehension: verbalized understanding  HOME  EXERCISE PROGRAM: NA today  ASSESSMENT:  CLINICAL IMPRESSION: Patient is a 77 y.o. female who was seen today for physical therapy evaluation and treatment for chronic pelvic pain and LT hip/low back pain. Exam findings notable for decreased lumbar A/ROM with discomfort on LT, abnormal posture with pelvic rotation, functional weakness noted with single leg stance and squat, breath holding with bed mobility, significant decrease in Lt hamstring length, 2 finger width upper diastasis recti abdominus without distortion, weak transversus abdominus contraction, decreased hip rotation strength, and tenderness at Lt ischial tuberosity and hamstrings/Lt posterolateral hip mm. Signs and symptoms are most consistent with pelvic floor muscle and Lt hip muscle spasm due to a combination of interstitial cystitis and chronic Lt hip labral tear that are leading to the pudendal neuralgia. We will plan to evaluate pelvic floor muscle more specifically next session. There is concerning mass in patient's Rt abdomen lateral to umbilicus that she was encouraged to consult MD about. She will continue to benefit from skilled PT intervention in order to decrease pelvic pain, improve low back/Lt hip pain, decrease urinary urgency, and begin/progress functional strengthening program.   OBJECTIVE IMPAIRMENTS: decreased activity tolerance, decreased coordination, decreased endurance, decreased mobility, decreased strength, increased fascial restrictions, increased muscle spasms, impaired tone, postural dysfunction, and pain.   ACTIVITY LIMITATIONS: lifting, bending, sitting, squatting, and bed mobility  PARTICIPATION LIMITATIONS: cleaning, driving, and community activity  PERSONAL FACTORS: 1 comorbidity: medical history  are also affecting patient's functional outcome.   REHAB POTENTIAL: Good  CLINICAL DECISION MAKING: Evolving/moderate complexity  EVALUATION COMPLEXITY: Moderate   GOALS: Goals reviewed with patient?  Yes  SHORT TERM GOALS: Target date: 02/21/23  Pt will be independent with HEP.   Baseline: Goal status: INITIAL  2.  Pt will be able to teach back and utilize urge suppression technique in order to help reduce number of trips to the bathroom.    Baseline:  Goal status: INITIAL  3.  Pt will begin use of pelvic floor muscle wand on regular basis (2-3x/week) in order to help reduce pelvic floor muscle tension and pain. Baseline:  Goal status: INITIAL  4.  Pt will be independent with diaphragmatic  breathing and down training activities in order to improve pelvic floor relaxation.  Baseline:  Goal status: INITIAL  5.  Pt will report pelvic pain no greater than 4/10. Baseline:  Goal status: INITIAL   LONG TERM GOALS: Target date: 03/21/23  Pt will be independent with advanced HEP.   Baseline:  Goal status: INITIAL  2.  Pt will be able to go 2-3 hours in between voids without urgency or incontinence in order to improve QOL and perform all functional activities with less difficulty.   Baseline:  Goal status: INITIAL  3.  Pt will report pelvic pain no greater than 2/10.  Baseline:  Goal status: INITIAL  4.  Pt will be able to sit for >30 minutes without any increased pelvic pain.  Baseline:  Goal status: INITIAL  5.  Pt will be able to stand on single leg without pelvic drop and squat without weight shift in order to demonstrate improved functional core and hip strength to more appropriately stabilize pelvis.  Baseline:  Goal status: INITIAL  6.  Pt will demonstrate increase in all impaired hip strength by 1 muscle grades in order to demonstrate improved lumbopelvic support and increase functional ability.   Baseline:  Goal status: INITIAL  PLAN:  PT FREQUENCY: 1-2x/week  PT DURATION: 8 weeks  PLANNED INTERVENTIONS: 97110-Therapeutic exercises, 97530- Therapeutic activity, 97112- Neuromuscular re-education, 97535- Self Care, [id number]- Manual therapy, Dry Needling, and  Biofeedback  PLAN FOR NEXT SESSION: test hamstring strength, internal pelvic floor muscle exam and treatment to tolerance, initial HEP   Josette Mares, PT, DPT01/14/254:44 PM

## 2023-01-25 ENCOUNTER — Ambulatory Visit (INDEPENDENT_AMBULATORY_CARE_PROVIDER_SITE_OTHER): Payer: Medicare Other | Admitting: Family Medicine

## 2023-01-25 ENCOUNTER — Other Ambulatory Visit (INDEPENDENT_AMBULATORY_CARE_PROVIDER_SITE_OTHER): Payer: Medicare Other

## 2023-01-25 ENCOUNTER — Encounter: Payer: Self-pay | Admitting: *Deleted

## 2023-01-25 ENCOUNTER — Encounter: Payer: Self-pay | Admitting: Family Medicine

## 2023-01-25 VITALS — BP 116/74 | HR 71 | Temp 97.6°F | Ht 66.0 in | Wt 110.0 lb

## 2023-01-25 DIAGNOSIS — E7849 Other hyperlipidemia: Secondary | ICD-10-CM

## 2023-01-25 DIAGNOSIS — R7401 Elevation of levels of liver transaminase levels: Secondary | ICD-10-CM

## 2023-01-25 DIAGNOSIS — R1901 Right upper quadrant abdominal swelling, mass and lump: Secondary | ICD-10-CM | POA: Diagnosis not present

## 2023-01-25 DIAGNOSIS — I700 Atherosclerosis of aorta: Secondary | ICD-10-CM

## 2023-01-25 LAB — HEPATIC FUNCTION PANEL
ALT: 29 U/L (ref 0–35)
AST: 34 U/L (ref 0–37)
Albumin: 4.5 g/dL (ref 3.5–5.2)
Alkaline Phosphatase: 61 U/L (ref 39–117)
Bilirubin, Direct: 0 mg/dL (ref 0.0–0.3)
Total Bilirubin: 0.7 mg/dL (ref 0.2–1.2)
Total Protein: 7.2 g/dL (ref 6.0–8.3)

## 2023-01-25 LAB — LIPID PANEL
Cholesterol: 206 mg/dL — ABNORMAL HIGH (ref 0–200)
HDL: 77.4 mg/dL (ref >39.00)
LDL Cholesterol: 114 mg/dL — ABNORMAL HIGH (ref 0–99)
NonHDL: 128.55
Total CHOL/HDL Ratio: 3
Triglycerides: 72 mg/dL (ref 0.0–149.0)
VLDL: 14.4 mg/dL (ref 0.0–40.0)

## 2023-01-25 NOTE — Assessment & Plan Note (Signed)
 Chronic, not interested in statin... LDL not at goal < 70.  Working on low cholesterol diet.

## 2023-01-25 NOTE — Progress Notes (Signed)
Patient ID: Jessica Miles, female    DOB: 1945-02-24, 78 y.o.   MRN: [id number]  This visit was conducted in person.  BP 116/74   Pulse 71   Temp 97.6 F (36.4 C) (Oral)   Ht 5\' 6"  (1.676 m)   Wt 110 lb (49.9 kg)   SpO2 93%   BMI 17.75 kg/m    CC:  Chief Complaint  Patient presents with   Right side mass    Therapist advised her to see the provider because she noticed yesterday at her appointment. Feels no pain     Subjective:   HPI: Jessica Miles is a 78 y.o. female presenting on 01/25/2023 for Right side mass (Therapist advised her to see the provider because she noticed yesterday at her appointment. Feels no pain )   Noted a prominence in right abdomen at waist... noted yesterday by pelvic floor therapist on exam.  No skin lesion. No pain.    Reviewed labs in detail with pt.Marland Kitchen LFTs back in nml range... stopped taking tylenol PM.    HX of Aortic Atherosclerosis on CT... not interested in statin... LDL not at goal < 70. Lab Results  Component Value Date   CHOL 206 (H) 01/25/2023   HDL 77.40 01/25/2023   LDLCALC 114 (H) 01/25/2023   TRIG 72.0 01/25/2023   CHOLHDL 3 01/25/2023    10/2021 CT abd:  renal cysts   12/2021 US abdomen:  2022  Daily BMs every morning.  Relevant past medical, surgical, family and social history reviewed and updated as indicated. Interim medical history since our last visit reviewed. Allergies and medications reviewed and updated. Outpatient Medications Prior to Visit  Medication Sig Dispense Refill   alendronate (FOSAMAX) 70 MG tablet Take 1 tablet (70 mg total) by mouth every 7 (seven) days. Take with a full glass of water on an empty stomach. 4 tablet 11   ALPHA LIPOIC ACID PO Take 100 mg by mouth daily.     ascorbic acid (VITAMIN C) 500 MG tablet Take 500 mg by mouth 2 (two) times daily.      atenolol (TENORMIN) 25 MG tablet Take 25 mg by mouth daily as needed (palpitations/pulse racing).     buPROPion ER (WELLBUTRIN SR) 100 MG 12 hr tablet  TAKE 1 TABLET TWICE A DAY 180 tablet 3   busPIRone (BUSPAR) 5 MG tablet Take 1 tablet (5 mg total) by mouth 2 (two) times daily as needed (anxiety). 180 tablet 0   Calcium Carb-Cholecalciferol (CALCIUM 600 + D PO) Take 1 tablet by mouth in the morning. 600 mg /400 mcg     Cholecalciferol (VITAMIN D-3 PO) Take 5,000 Units by mouth in the morning.     colestipol (COLESTID) 1 g tablet Take 2 tablets (2 g total) by mouth 2 (two) times daily. 360 tablet 3   esomeprazole (NEXIUM) 40 MG capsule Take 1 capsule (40 mg total) by mouth daily. (Patient taking differently: Take 40 mg by mouth daily as needed.) 30 capsule 1   hydrocortisone-pramoxine (ANALPRAM-HC) 2.5-1 % rectal cream Place 1 Application rectally as needed for hemorrhoids or anal itching. 30 g 1   ketoconazole (NIZORAL) 2 % cream Apply between the third and fourth toe twice a day (Patient taking differently: Apply 1 Application topically 2 (two) times daily as needed for irritation (podiatry).) 60 g 2   levothyroxine (SYNTHROID) 100 MCG tablet Take 1 tablet (100 mcg total) by mouth daily.     Lifitegrast (XIIDRA) 5 % SOLN Place  1 drop into both eyes 2 (two) times daily.     MAGNESIUM PO Take 125 mg by mouth daily.     Methylcellulose, Laxative, (CITRUCEL PO) Take 1 Dose by mouth 2 (two) times daily between meals as needed.     MULTIPLE VITAMIN PO Take 1 tablet by mouth in the morning.     mupirocin cream (BACTROBAN) 2 % Apply 1 application topically 2 (two) times daily. (Patient taking differently: Apply 1 application  topically daily as needed (Podiatry).) 15 g 2   nitroGLYCERIN (NITRO-DUR) 0.2 mg/hr patch Place 1 patch (0.2 mg total) onto the skin daily. (Patient taking differently: Place 0.2 mg onto the skin daily as needed (When walking).) 30 patch 12   thiamine (VITAMIN B-1) 100 MG tablet Take 100 mg by mouth daily.     triamcinolone cream (KENALOG) 0.5 % Apply 1 Application topically 2 (two) times daily. 30 g 0   TYRVAYA 0.03 MG/ACT SOLN  Place 1 spray into both nostrils daily as needed (dry eyes).     Cyanocobalamin (VITAMIN B-12) 2500 MCG SUBL Take 2,500 mcg by mouth in the morning.     MELATONIN PO Take 10 mg by mouth at bedtime. (Patient not taking: Reported on 12/16/2022)     Multiple Vitamins-Minerals (ZINC PO) Take 22 mg by mouth in the morning.     NON FORMULARY Take 1 tablet by mouth daily. VSL live probiotic     OVER THE COUNTER MEDICATION Take 1.5-2 tablets by mouth at bedtime. gummy 55 mg of CBD 5 mg THC     OVER THE COUNTER MEDICATION Take 1 capsule by mouth 2 (two) times daily with a meal. Pepperment     No facility-administered medications prior to visit.     Per HPI unless specifically indicated in ROS section below Review of Systems  Constitutional:  Negative for fatigue and fever.  HENT:  Negative for congestion.   Eyes:  Negative for pain.  Respiratory:  Negative for cough and shortness of breath.   Cardiovascular:  Negative for chest pain, palpitations and leg swelling.  Gastrointestinal:  Negative for abdominal pain.  Genitourinary:  Negative for dysuria and vaginal bleeding.  Musculoskeletal:  Negative for back pain.  Neurological:  Negative for syncope, light-headedness and headaches.  Psychiatric/Behavioral:  Negative for dysphoric mood.    Objective:  BP 116/74   Pulse 71   Temp 97.6 F (36.4 C) (Oral)   Ht 5\' 6"  (1.676 m)   Wt 110 lb (49.9 kg)   SpO2 93%   BMI 17.75 kg/m   Wt Readings from Last 3 Encounters:  01/25/23 110 lb (49.9 kg)  12/16/22 112 lb (50.8 kg)  10/26/22 108 lb (49 kg)      Physical Exam Constitutional:      General: She is not in acute distress.    Appearance: Normal appearance. She is well-developed. She is not ill-appearing or toxic-appearing.  HENT:     Head: Normocephalic.     Right Ear: Hearing, tympanic membrane, ear canal and external ear normal. Tympanic membrane is not erythematous, retracted or bulging.     Left Ear: Hearing, tympanic membrane, ear  canal and external ear normal. Tympanic membrane is not erythematous, retracted or bulging.     Nose: No mucosal edema or rhinorrhea.     Right Sinus: No maxillary sinus tenderness or frontal sinus tenderness.     Left Sinus: No maxillary sinus tenderness or frontal sinus tenderness.     Mouth/Throat:  Pharynx: Uvula midline.  Eyes:     General: Lids are normal. Lids are everted, no foreign bodies appreciated.     Conjunctiva/sclera: Conjunctivae normal.     Pupils: Pupils are equal, round, and reactive to light.  Neck:     Thyroid: No thyroid mass or thyromegaly.     Vascular: No carotid bruit.     Trachea: Trachea normal.  Cardiovascular:     Rate and Rhythm: Normal rate and regular rhythm.     Pulses: Normal pulses.     Heart sounds: Normal heart sounds, S1 normal and S2 normal. No murmur heard.    No friction rub. No gallop.  Pulmonary:     Effort: Pulmonary effort is normal. No tachypnea or respiratory distress.     Breath sounds: Normal breath sounds. No decreased breath sounds, wheezing, rhonchi or rales.  Abdominal:     General: Bowel sounds are normal.     Palpations: Abdomen is soft. There is mass. There is no shifting dullness, fluid wave, hepatomegaly, splenomegaly or pulsatile mass.     Tenderness: There is no abdominal tenderness. There is no right CVA tenderness, left CVA tenderness, guarding or rebound. Negative signs include Murphy's sign, McBurney's sign and psoas sign.     Hernia: No hernia is present.       Comments: Mobile 6 cm round prominence noted in right mid abdomen, nontender There is also a small superficial nodule on her last rib on the right, nonmobile approximately 1 cm in diameter  Musculoskeletal:     Cervical back: Normal range of motion and neck supple.  Skin:    General: Skin is warm and dry.     Findings: No rash.  Neurological:     Mental Status: She is alert.  Psychiatric:        Mood and Affect: Mood is not anxious or depressed.         Speech: Speech normal.        Behavior: Behavior normal. Behavior is cooperative.        Thought Content: Thought content normal.        Judgment: Judgment normal.       Results for orders placed or performed in visit on 01/25/23  Hepatic Function Panel   Collection Time: 01/25/23  7:23 AM  Result Value Ref Range   Total Bilirubin 0.7 0.2 - 1.2 mg/dL   Bilirubin, Direct 0.0 0.0 - 0.3 mg/dL   Alkaline Phosphatase 61 39 - 117 U/L   AST 34 0 - 37 U/L   ALT 29 0 - 35 U/L   Total Protein 7.2 6.0 - 8.3 g/dL   Albumin 4.5 3.5 - 5.2 g/dL  Lipid panel   Collection Time: 01/25/23  7:23 AM  Result Value Ref Range   Cholesterol 206 (H) 0 - 200 mg/dL   Triglycerides 33.2 0.0 - 149.0 mg/dL   HDL 95.18 >84.16 mg/dL   VLDL 60.6 0.0 - 30.1 mg/dL   LDL Cholesterol 601 (H) 0 - 99 mg/dL   Total CHOL/HDL Ratio 3    NonHDL 128.55     Assessment and Plan  Aortic atherosclerosis (HCC) Assessment & Plan:  Chronic, not interested in statin... LDL not at goal < 70.  Working on low cholesterol diet.   Right upper quadrant abdominal mass Assessment & Plan: Acute, smaller lesion over rib may be cyst versus bony lesion. Size of liver and spleen. Larger right mid abdomen prominence/mass potentially stool palpated given patient very thin, but patient  reports regular bowel movements. Will evaluate with complete abdominal ultrasound.  Will hold on transvaginal component at this time given mass is higher than adnexa.   Orders: -     US Abdomen Complete; Future    No follow-ups on file.   Kerby Nora, MD

## 2023-01-26 ENCOUNTER — Ambulatory Visit: Payer: Medicare Other

## 2023-01-26 ENCOUNTER — Other Ambulatory Visit: Payer: Medicare Other

## 2023-01-26 ENCOUNTER — Encounter: Payer: Self-pay | Admitting: Family Medicine

## 2023-01-26 DIAGNOSIS — R1901 Right upper quadrant abdominal swelling, mass and lump: Secondary | ICD-10-CM | POA: Insufficient documentation

## 2023-01-26 DIAGNOSIS — R102 Pelvic and perineal pain: Secondary | ICD-10-CM

## 2023-01-26 DIAGNOSIS — M6281 Muscle weakness (generalized): Secondary | ICD-10-CM

## 2023-01-26 DIAGNOSIS — M5459 Other low back pain: Secondary | ICD-10-CM

## 2023-01-26 DIAGNOSIS — G588 Other specified mononeuropathies: Secondary | ICD-10-CM | POA: Diagnosis not present

## 2023-01-26 DIAGNOSIS — R293 Abnormal posture: Secondary | ICD-10-CM

## 2023-01-26 DIAGNOSIS — M25552 Pain in left hip: Secondary | ICD-10-CM

## 2023-01-26 DIAGNOSIS — M62838 Other muscle spasm: Secondary | ICD-10-CM

## 2023-01-26 DIAGNOSIS — R279 Unspecified lack of coordination: Secondary | ICD-10-CM

## 2023-01-26 NOTE — Therapy (Signed)
OUTPATIENT PHYSICAL THERAPY FEMALE PELVIC TREATMENT   Patient Name: Jessica Miles MRN: [id number] DOB:09-Jun-1945, 78 y.o., female Today's Date: 01/26/2023  END OF SESSION:  PT End of Session - 01/26/23 0848     Visit Number 2    Date for PT Re-Evaluation 03/21/23    Authorization Type Medicare - needs Kx at 15    Progress Note Due on Visit 10    PT Start Time 0845    PT Stop Time 0925    PT Time Calculation (min) 40 min    Activity Tolerance Patient tolerated treatment well    Behavior During Therapy [facility] for tasks assessed/performed             Past Medical History:  Diagnosis Date   Allergy    Anxiety    COVID-19 virus infection    09-23-2019- 9-16 had monoclonal infusion    Depression    History of IBS    HPV (human papilloma virus) infection    Hyperlipidemia    83- not taking statin    Loss of taste    Neuropathy 2015   surgery for Mortons neuroma in 1985   Osteopenia    Osteoporosis    Raynaud's disease 02/2020   Tachycardia    Thyroid disease    Past Surgical History:  Procedure Laterality Date   BIOPSY  01/25/2022   Procedure: BIOPSY;  Surgeon: Lemar Lofty., MD;  Location: [facility];  Service: Gastroenterology;;   COLONOSCOPY  2019   hems only    ESOPHAGOGASTRODUODENOSCOPY (EGD) WITH PROPOFOL N/A 01/25/2022   Procedure: ESOPHAGOGASTRODUODENOSCOPY (EGD) WITH PROPOFOL;  Surgeon: Lemar Lofty., MD;  Location: [facility];  Service: Gastroenterology;  Laterality: N/A;   EUS N/A 01/25/2022   Procedure: UPPER ENDOSCOPIC ULTRASOUND (EUS) LINEAR;  Surgeon: Lemar Lofty., MD;  Location: [facility];  Service: Gastroenterology;  Laterality: N/A;   FOOT NEUROMA SURGERY  1985   WISDOM TOOTH EXTRACTION     Patient Active Problem List   Diagnosis Date Noted   Panic attacks 10/26/2022   Allergic reaction to insect bite 06/23/2022   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   Severely underweight adult 09/07/2021   Acute diarrhea 04/27/2021    Inflamed sebaceous cyst 02/11/2021   Transient disorientation 10/20/2020   Memory change 08/05/2020   Family history of early CAD 02/27/2020   Tremor 02/27/2020   Aortic atherosclerosis (HCC) 12/19/2019   Urinary urgency 05/21/2019   Bladder irritation 05/21/2019   External hemorrhoid 05/21/2019   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   Raynaud's syndrome 02/09/2019   Osteoarthritis of thumb, 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   Fatigue 12/19/2017   Acute left-sided low back pain without sciatica 12/19/2017   SVT (supraventricular tachycardia) (HCC) 08/31/2017   Peripheral neuropathy 08/31/2017   Dry eyes 09/21/2015   Atopic dermatitis 03/13/2015   IBS (irritable bowel syndrome) 03/18/2014   Osteoporosis 03/18/2014   Major depressive disorder, recurrent episode, moderate (HCC) 03/18/2014   Hypothyroidism 12/14/2013    PCP: Excell Seltzer, MD  REFERRING PROVIDER: Sherre Scarlet, MD  REFERRING DIAG: M79.18 (ICD-10-CM) - Myalgia, other site G58.8 (ICD-10-CM) - Other specified mononeuropathies  THERAPY DIAG:  Other muscle spasm  Pelvic pain  Pain in left hip  Other low back pain  Muscle weakness (generalized)  Abnormal posture  Unspecified lack of coordination  Rationale for Evaluation and Treatment: Rehabilitation  ONSET DATE: 3 years   SUBJECTIVE:  SUBJECTIVE STATEMENT: Pt states that she was able to see PCP about abdominal mass and is scheduled for [place] on 02/10/23.    PAIN:  Are you having pain? Yes NPRS scale: 1/10 Pain location:  Lt hip and perineum/vulva  Pain type: soreness Pain description: intermittent   Aggravating factors: sitting Relieving factors: lying flat, sitting on toilet  PRECAUTIONS: Other: large mass in Rt lower quadrant that has not been treated by medical team  RED  FLAGS: None   WEIGHT BEARING RESTRICTIONS: No  FALLS:  Has patient fallen in last 6 months? No  LIVING ENVIRONMENT: Lives with: lives with their family Lives in: House/apartment  OCCUPATION: retired Runner, broadcasting/film/video  PLOF: Independent  PATIENT GOALS: decrease pain  PERTINENT HISTORY:  Interstitial cystitis  BOWEL MOVEMENT: Pain with bowel movement: No Type of bowel movement:Frequency 1x/day and Strain No Fully empty rectum: No Leakage: No - none currently Pads: No Fiber supplement: No  URINATION: Pain with urination: No Fully empty bladder: Yes: - Stream: Strong Urgency: Yes: have to run  Frequency: regular Leakage:  none Pads: No  INTERCOURSE: NA  PREGNANCY: Vaginal deliveries 2 Tearing No C-section deliveries 0 Currently pregnant No  PROLAPSE: None   OBJECTIVE:  Note: Objective measures were completed at Evaluation unless otherwise noted. 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, swelling of quadratus femoris muscle, tendinopathic and frayed hamstring origin Scheduled for anal manometry in April 2025   COGNITION: Overall cognitive status: Within functional limits for tasks assessed     SENSATION: Light touch: Appears intact Proprioception: Appears intact   FUNCTIONAL/SPECIAL TESTS:  Single leg stance:  Rt: more stable, some Lt drop - compensated  trendelenburg  Lt: less stable, more Rt drop - compensated trendelenburg Squat: Lt weight shift, Rt valgus knee collapse FAIR: (+) Lt hip Hamstring length: significant decrease with pain on Lt, -75 degrees Curl-up test: breath holding, but no distortion, 2 finger width diastasis in upper abdominals Bed mobility: breath holding  Palpable transversus abdominus contraction bil, but weak Tenderness at Lt ischial tuberosity and hamstrings/Lt posterolateral hip mm  GAIT: Comments: WNL  POSTURE: forward head, decreased lumbar lordosis, posterior pelvic tilt, and Lt lower thoracic/upper lumbar curvature, elevated Lt iliac crest  PELVIC ALIGNMENT: Lt posterior rotation, Lt sacral facing rotation   LUMBARAROM/PROM:  A/PROM A/PROM  Eval (% available)  Flexion 75, pain on LT  Extension 75  Right lateral flexion 100  Left lateral flexion 100, pain in back and Lt pelvis  Right rotation 75  Left rotation 50, feels good on back   (Blank rows = not tested)   LOWER EXTREMITY MMT:  MMT Right eval Left eval  Hip abduction 5/5 5/5  Hip  adduction    Hip internal rotation 3/5 4-/5  Hip external rotation 3/5 4/5   PALPATION:   General: Large mass located in Rt abdomen lateral to umbilicus, non-tender, but with pressure it caused discomfort in low back - patient had not noticed before and reports no doctor has mentioned to her before    TODAY'S TREATMENT:                                                                                                                              DATE:  01/26/23 Manual: Pt provides verbal consent for internal vaginal/rectal pelvic floor exam. Internal vaginal pelvic floor muscle exam Vaginal pelvic floor muscle release to deep Lt layers Trigger Point Dry Needling  Initial Treatment: Pt instructed on Dry Needling rational, procedures, and possible side effects. Pt instructed to expect mild to moderate muscle soreness later in the day and/or into the next  day.  Pt instructed in methods to reduce muscle soreness. Pt instructed to continue prescribed HEP. Patient was educated on signs and symptoms of infection and other risk factors and advised to seek medical attention should they occur.  Patient verbalized understanding of these instructions and education.   Patient Verbal Consent Given: Yes Education Handout Provided: Yes Muscles Treated: Lt glute med Electrical Stimulation Performed: No Treatment Response/Outcome: twitch response and release Soft tissue mobilization to Lt glutes  01/24/23  EVAL    PATIENT EDUCATION:  Education details: See above Person educated: Patient Education method: Programmer, multimedia, Demonstration, Tactile cues, Verbal cues, and Handouts Education comprehension: verbalized understanding  HOME EXERCISE PROGRAM: NA today  ASSESSMENT:  CLINICAL IMPRESSION: Internal pelvic floor muscle exam performed vaginally and she demonstrated a lot of  tissue tenderness externally and internally most consistent with tissue atrophy. We discussed talking with MD about possible benefit from topical estrogen cream. Her Lt deep pelvic floor muscle did demonstrate high tone and tension with different type of discomfort. We performed pelvic floor muscle release to these muscles with excellent improvement in restriction. Trigger points also palpated in Lt glute med and dry needling performed with excellent twitch response and release. Soft tissue mobilization performed to this area afterward; some further improvement in soft tissue restriction. Pt reported good decrease in pain at end of session. She will continue to benefit from skilled PT intervention in order to decrease pelvic pain, improve low back/Lt hip pain, decrease urinary urgency, and begin/progress functional strengthening program.   OBJECTIVE IMPAIRMENTS: decreased activity tolerance, decreased coordination, decreased endurance, decreased mobility, decreased strength, increased  fascial restrictions, increased muscle spasms, impaired tone, postural dysfunction, and pain.   ACTIVITY LIMITATIONS: lifting, bending, sitting, squatting, and bed mobility  PARTICIPATION LIMITATIONS: cleaning, driving, and community activity  PERSONAL FACTORS: 1 comorbidity: medical history  are also affecting patient's functional outcome.   REHAB POTENTIAL: Good  CLINICAL DECISION MAKING: Evolving/moderate complexity  EVALUATION COMPLEXITY: Moderate   GOALS: Goals reviewed with patient? Yes  SHORT TERM GOALS: Target date: 02/21/23  Pt will be independent with HEP.   Baseline: Goal status: INITIAL  2.  Pt will be able to teach back and utilize urge suppression technique in order to help reduce number of trips to the bathroom.    Baseline:  Goal status: INITIAL  3.  Pt will begin use of pelvic floor muscle wand on regular basis (2-3x/week) in order to help reduce pelvic floor muscle tension and pain. Baseline:  Goal status: INITIAL  4.  Pt will be independent with diaphragmatic breathing and down training activities in order to improve pelvic floor relaxation.  Baseline:  Goal status: INITIAL  5.  Pt will report pelvic pain no greater than 4/10. Baseline:  Goal status: INITIAL   LONG TERM GOALS: Target date: 03/21/23  Pt will be independent with advanced HEP.   Baseline:  Goal status: INITIAL  2.  Pt will be able to go 2-3 hours in between voids without urgency or incontinence in order to improve QOL and perform all functional activities with less difficulty.   Baseline:  Goal status: INITIAL  3.  Pt will report pelvic pain no greater than 2/10.  Baseline:  Goal status: INITIAL  4.  Pt will be able to sit for >30 minutes without any increased pelvic pain.  Baseline:  Goal status: INITIAL  5.  Pt will be able to stand on single leg without pelvic drop and squat without weight shift in order to demonstrate improved functional core and hip strength to more  appropriately stabilize pelvis.  Baseline:  Goal status: INITIAL  6.  Pt will demonstrate increase in all impaired hip strength by 1 muscle grades in order to demonstrate improved lumbopelvic support and increase functional ability.   Baseline:  Goal status: INITIAL  PLAN:  PT FREQUENCY: 1-2x/week  PT DURATION: 8 weeks  PLANNED INTERVENTIONS: 97110-Therapeutic exercises, 97530- Therapeutic activity, 97112- Neuromuscular re-education, 97535- Self Care, 62952- Manual therapy, Dry Needling, and Biofeedback  PLAN FOR NEXT SESSION: test hamstring strength, internal pelvic floor muscle exam and treatment to tolerance, initial HEP   Julio Alm, PT, DPT01/16/2510:35 AM

## 2023-01-26 NOTE — Assessment & Plan Note (Signed)
Acute, smaller lesion over rib may be cyst versus bony lesion. Size of liver and spleen. Larger right mid abdomen prominence/mass potentially stool palpated given patient very thin, but patient reports regular bowel movements. Will evaluate with complete abdominal ultrasound.  Will hold on transvaginal component at this time given mass is higher than adnexa.

## 2023-01-26 NOTE — Patient Instructions (Signed)
 Trigger Point Dry Needling  What is Trigger Point Dry Needling (DN)? DN is a physical therapy technique used to treat muscle pain and dysfunction. Specifically, DN helps deactivate muscle trigger points (muscle knots).  A thin filiform needle is used to penetrate the skin and stimulate the underlying trigger point. The goal is for a local twitch response (LTR) to occur and for the trigger point to relax. No medication of any kind is injected during the procedure.   What Does Trigger Point Dry Needling Feel Like?  The procedure feels different for each individual patient. Some patients report that they do not actually feel the needle enter the skin and overall the process is not painful. Very mild bleeding may occur. However, many patients feel a deep cramping in the muscle in which the needle was inserted. This is the local twitch response.   How Will I feel after the treatment? Soreness is normal, and the onset of soreness may not occur for a few hours. Typically this soreness does not last longer than two days.  Bruising is uncommon, however; ice can be used to decrease any possible bruising.  In rare cases feeling tired or nauseous after the treatment is normal. In addition, your symptoms may get worse before they get better, this period will typically not last longer than 24 hours.   What Can I do After My Treatment? Increase your hydration by drinking more water for the next 24 hours.  You may place ice or heat on the areas treated that have become sore, however, do not use heat on inflamed or bruised areas. Heat often brings more relief post needling. You can continue your regular activities, but vigorous activity is not recommended initially after the treatment for 24 hours. DN is best combined with other physical therapy such as strengthening, stretching, and other therapies.   What are the complications? While your therapist has had extensive training in minimizing the risks of trigger  point dry needling, it is important to understand the risks of any procedure.  Risks include bleeding, pain, fatigue, hematoma, infection, vertigo, nausea or nerve involvement. Monitor for any changes to your skin or sensation. Contact your therapist or MD with concerns.  A rare but serious complication is a pneumothorax over or near your middle and upper chest and back If you have dry needling in this area, monitor for the following symptoms: Shortness of breath on exertion and/or Difficulty taking a deep breath and/or Chest Pain and/or A dry cough If any of the above symptoms develop, please go to the nearest emergency room or call 911. Tell them you had dry needling over your thorax and report any symptoms you are having. Please follow-up with your treating therapist after you complete the medical evaluation.   [facility]  [address] [place] [address].  [phone]

## 2023-01-30 NOTE — Therapy (Signed)
OUTPATIENT PHYSICAL THERAPY THORACOLUMBAR EVALUATION   Patient Name: Jessica Miles MRN: [id number] DOB:1945/04/10, 78 y.o., female Today's Date: 01/31/2023  END OF SESSION:  PT End of Session - 01/31/23 0925     Visit Number 3   1 pelvic   Date for PT Re-Evaluation 03/28/23   ortho   Authorization Type Medicare - needs Kx at 15    Progress Note Due on Visit 10    PT Start Time 0835    PT Stop Time 0925    PT Time Calculation (min) 50 min    Activity Tolerance Patient tolerated treatment well    Behavior During Therapy [facility] for tasks assessed/performed             Past Medical History:  Diagnosis Date   Allergy    Anxiety    COVID-19 virus infection    09-23-2019- 9-16 had monoclonal infusion    Depression    History of IBS    HPV (human papilloma virus) infection    Hyperlipidemia    82- not taking statin    Loss of taste    Neuropathy 2015   surgery for Mortons neuroma in 1985   Osteopenia    Osteoporosis    Raynaud's disease 02/2020   Tachycardia    Thyroid disease    Past Surgical History:  Procedure Laterality Date   BIOPSY  01/25/2022   Procedure: BIOPSY;  Surgeon: Lemar Lofty., MD;  Location: [facility];  Service: Gastroenterology;;   COLONOSCOPY  2019   hems only    ESOPHAGOGASTRODUODENOSCOPY (EGD) WITH PROPOFOL N/A 01/25/2022   Procedure: ESOPHAGOGASTRODUODENOSCOPY (EGD) WITH PROPOFOL;  Surgeon: Lemar Lofty., MD;  Location: [facility];  Service: Gastroenterology;  Laterality: N/A;   EUS N/A 01/25/2022   Procedure: UPPER ENDOSCOPIC ULTRASOUND (EUS) LINEAR;  Surgeon: Lemar Lofty., MD;  Location: [facility];  Service: Gastroenterology;  Laterality: N/A;   FOOT NEUROMA SURGERY  1985   WISDOM TOOTH EXTRACTION     Patient Active Problem List   Diagnosis Date Noted   Right upper quadrant abdominal mass 01/26/2023   Panic attacks 10/26/2022   Allergic reaction to insect bite 06/23/2022   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    Severely underweight adult 09/07/2021   Acute diarrhea 04/27/2021   Inflamed sebaceous cyst 02/11/2021   Transient disorientation 10/20/2020   Memory change 08/05/2020   Family history of early CAD 02/27/2020   Tremor 02/27/2020   Aortic atherosclerosis (HCC) 12/19/2019   Urinary urgency 05/21/2019   Bladder irritation 05/21/2019   External hemorrhoid 05/21/2019   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   Raynaud's syndrome 02/09/2019   Osteoarthritis of thumb, 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   Fatigue 12/19/2017   Acute left-sided low back pain without sciatica 12/19/2017   SVT (supraventricular tachycardia) (HCC) 08/31/2017   Peripheral neuropathy 08/31/2017   Dry eyes 09/21/2015   Atopic dermatitis 03/13/2015   IBS (irritable bowel syndrome) 03/18/2014   Osteoporosis 03/18/2014   Major depressive disorder, recurrent episode, moderate (HCC) 03/18/2014   Hypothyroidism 12/14/2013    PCP: Kerby Nora, MD  REFERRING PROVIDER: Juanell Fairly, MD  REFERRING DIAG: Lumbar radiculopathy, strain of muscle, fascia and tendon of the posterior muscle group of the thigh level   Rationale for Evaluation and Treatment: Rehabilitation  THERAPY DIAG:  Other muscle spasm - Plan: PT plan of care cert/re-cert  Pain in left hip - Plan: PT plan of care cert/re-cert  Muscle weakness (generalized) - Plan: PT plan of care cert/re-cert  Other low back pain - Plan: PT  plan of care cert/re-cert  ONSET DATE: 2 months ago (flare up) after chronic/intermittent   SUBJECTIVE:                                                                                                                                                                                           SUBJECTIVE STATEMENT: Pt presents to PT with Lt LE pain that began December 2024.  She has had this pain before and had PT ~1.5 years ago.  Imaging shows labral tear of the left hip, ischiofemoral impingement, tendiopathic and frayed hamstring origin.  She is active  in pelvic PT for pudendal nerve entrapment.   PERTINENT HISTORY:  Depression, osteopenia/porosis,Raynaud's, Lt hip labral tear and frayed hamstring origin, IBS  PAIN: 01/31/23 Are you having pain? Yes: NPRS scale: 2-5/10  Pain location: Lt hamstring insertion and gluteal  Pain description: aching, constant  Aggravating factors: sitting long periods, after exercise Relieving factors: exercising (during)  PRECAUTIONS: None  RED FLAGS: None   WEIGHT BEARING RESTRICTIONS: No  FALLS:  Has patient fallen in last 6 months? No  LIVING ENVIRONMENT: Lives with: lives with their spouse Lives in: House/apartment Stairs: No Has following equipment at home: None  OCCUPATION: retired Runner, broadcasting/film/video   PLOF: Independent and Leisure: exercises, walking/biking, reading   PATIENT GOALS: reduce Lt LE pain, sit without pain   NEXT MD VISIT: none scheduled   OBJECTIVE:  Note: Objective measures were completed at Evaluation unless otherwise noted.  DIAGNOSTIC FINDINGS:  10/2021 MRI Lt hip:  Mild Lt hip osteoarthropathy, labral tear of the left hip, ischiofemoral impingement, tendiopathic and frayed hamstring origin   PATIENT SURVEYS:  LEFS 60/64 (pt didn't answer questions 16-19 due to NA)  COGNITION: Overall cognitive status: Within functional limits for tasks assessed     SENSATION: WFL  MUSCLE LENGTH: Limited IR/ER bil hips by 25%.    POSTURE: rounded shoulders and forward head  PALPATION: Trigger points in Lt gluteals and reduced spinal mobility with PA Mobs in thoracic and lumbar spine  LUMBAR ROM:  Full lumbar A/ROM without pain  LOWER EXTREMITY ROM:      LOWER EXTREMITY MMT:    LOWER EXTREMITY MMT:   MMT Right eval Left eval  Hip abduction 5/5 5/5  Hip adduction      Hip internal rotation 3/5 4-/5  Hip external rotation 3/5 4/5     GAIT: Distance walked: 75 Assistive device utilized: None Level of assistance: Complete Independence Comments: mild scissoring  bil Step-ups: Rt and Lt with hip adduction bil  TREATMENT DATE:  01/31/23 HEP established: see below  PATIENT EDUCATION:  Education details: Access Code: [email or web address] Person educated: Patient Education method: Explanation, Demonstration, and Handouts Education comprehension: verbalized understanding and returned demonstration  HOME EXERCISE PROGRAM: Access Code: [email or web address] URL: [email or web address] Date: 01/31/2023 Prepared by: Tresa Endo  Exercises - Seated Figure 4 Piriformis Stretch  - 2-3 x daily - 7 x weekly - 3 sets - 10 reps - Clamshell  - 1 x daily - 7 x weekly - 2 sets - 10 reps - Sidelying Reverse Clamshell  - 1 x daily - 7 x weekly - 2 sets - 10 reps - Forward T with Counter Support  - 1 x daily - 7 x weekly - 1 sets - 10 reps  ASSESSMENT:  CLINICAL IMPRESSION: Patient is a 78 y.o. female who was seen today for physical therapy evaluation and treatment for Lt hip and gluteal pain. She has had chronic pain in this region with most recent flare up 2 months ago.  MRI from 2023 showed Lt hip labral tear and fraying at the Lt hamstring pelvic insertion.  She is active and exercises regularly at classes at her assisted living community.  Pt reports pain at the Lt gluteal and ischial tuberosity with sitting long periods.  LEFS score is 60/64 and pt with tension and trigger points in Lt gluteals and reduced spinal mobility and hip mobility throughout.   Patient will benefit from skilled PT to address the below impairments and improve overall function.    OBJECTIVE IMPAIRMENTS: decreased ROM, decreased strength, increased fascial restrictions, increased muscle spasms, impaired flexibility, and pain.   ACTIVITY LIMITATIONS: sitting  PARTICIPATION LIMITATIONS: driving  PERSONAL FACTORS: Age and 1-2 comorbidities: IBS, Lt hip labral tear and hamstring tear   are also affecting patient's functional outcome.   REHAB POTENTIAL: Good  CLINICAL DECISION MAKING: Stable/uncomplicated  EVALUATION COMPLEXITY: Low   GOALS: Goals reviewed with patient? Yes  SHORT TERM GOALS: Target date: 02/28/2023    Be independent in initial HEP Baseline: Goal status: INITIAL  2.  Report > or = to 30% reduction in Lt hip and gluteal pain with sitting  Baseline: 2-5/10 Goal status: INITIAL    LONG TERM GOALS: Target date: 03/28/2023    Be independent in advanced HEP Baseline:  Goal status: INITIAL  2.  Report > or = to 60% reduction in Lt hip and gluteal pain with sitting  Baseline:  Goal status: INITIAL  3.  Improve LEFS to > or = to 63/64 to improve function  Baseline:  Goal status: INITIAL  4.  Verbalie and demonstrate self-care strategies to manage pain including tissue mobility practices and change of position Baseline:  Goal status: INITIAL    PLAN:  PT FREQUENCY: 2x/week  PT DURATION: 8 weeks  PLANNED INTERVENTIONS: 97110-Therapeutic exercises, 97530- Therapeutic activity, 97112- Neuromuscular re-education, 97535- Self Care, 54098- Manual therapy, [id number]- Gait training, [phone]- Canalith repositioning, [id number]- Aquatic Therapy, 97014- Electrical stimulation (unattended), [id number]- Electrical stimulation (manual), [id number]- Traction (mechanical), Patient/Family education, Balance training, Stair training, Taping, Dry Needling, Joint mobilization, Joint manipulation, Spinal manipulation, Vestibular training, Cryotherapy, and Moist heat.  PLAN FOR NEXT SESSION: review HEP, single limb stability, foam rolling and DN for Lt hamstring   Lorrene Reid, PT 01/31/23 10:28 AM   [facility] [address], [address] [place], [place] [address] Phone # [phone] Fax [phone]

## 2023-01-31 ENCOUNTER — Other Ambulatory Visit: Payer: Self-pay

## 2023-01-31 ENCOUNTER — Ambulatory Visit: Payer: Medicare Other | Attending: Orthopedic Surgery

## 2023-01-31 ENCOUNTER — Ambulatory Visit: Payer: Medicare Other

## 2023-01-31 DIAGNOSIS — M5459 Other low back pain: Secondary | ICD-10-CM

## 2023-01-31 DIAGNOSIS — G588 Other specified mononeuropathies: Secondary | ICD-10-CM | POA: Diagnosis not present

## 2023-01-31 DIAGNOSIS — M25552 Pain in left hip: Secondary | ICD-10-CM

## 2023-01-31 DIAGNOSIS — M5416 Radiculopathy, lumbar region: Secondary | ICD-10-CM | POA: Diagnosis present

## 2023-01-31 DIAGNOSIS — M6281 Muscle weakness (generalized): Secondary | ICD-10-CM

## 2023-01-31 DIAGNOSIS — S76312D Strain of muscle, fascia and tendon of the posterior muscle group at thigh level, left thigh, subsequent encounter: Secondary | ICD-10-CM | POA: Diagnosis not present

## 2023-01-31 DIAGNOSIS — R293 Abnormal posture: Secondary | ICD-10-CM

## 2023-01-31 DIAGNOSIS — R102 Pelvic and perineal pain: Secondary | ICD-10-CM

## 2023-01-31 DIAGNOSIS — X58XXXD Exposure to other specified factors, subsequent encounter: Secondary | ICD-10-CM | POA: Insufficient documentation

## 2023-01-31 DIAGNOSIS — M62838 Other muscle spasm: Secondary | ICD-10-CM

## 2023-01-31 DIAGNOSIS — R279 Unspecified lack of coordination: Secondary | ICD-10-CM

## 2023-01-31 NOTE — Therapy (Signed)
OUTPATIENT PHYSICAL THERAPY FEMALE PELVIC TREATMENT   Patient Name: Jessica Miles MRN: [id number] DOB:May 01, 1945, 78 y.o., female Today's Date: 01/31/2023  END OF SESSION:  PT End of Session - 01/31/23 1008     Visit Number 4   PF   Date for PT Re-Evaluation 03/21/23    Authorization Type Medicare - needs Kx at 15    Progress Note Due on Visit 10    PT Start Time 1015    PT Stop Time 1055    PT Time Calculation (min) 40 min    Activity Tolerance Patient tolerated treatment well    Behavior During Therapy [facility] for tasks assessed/performed             Past Medical History:  Diagnosis Date   Allergy    Anxiety    COVID-19 virus infection    09-23-2019- 9-16 had monoclonal infusion    Depression    History of IBS    HPV (human papilloma virus) infection    Hyperlipidemia    82- not taking statin    Loss of taste    Neuropathy 2015   surgery for Mortons neuroma in 1985   Osteopenia    Osteoporosis    Raynaud's disease 02/2020   Tachycardia    Thyroid disease    Past Surgical History:  Procedure Laterality Date   BIOPSY  01/25/2022   Procedure: BIOPSY;  Surgeon: Lemar Lofty., MD;  Location: [facility];  Service: Gastroenterology;;   COLONOSCOPY  2019   hems only    ESOPHAGOGASTRODUODENOSCOPY (EGD) WITH PROPOFOL N/A 01/25/2022   Procedure: ESOPHAGOGASTRODUODENOSCOPY (EGD) WITH PROPOFOL;  Surgeon: Lemar Lofty., MD;  Location: [facility];  Service: Gastroenterology;  Laterality: N/A;   EUS N/A 01/25/2022   Procedure: UPPER ENDOSCOPIC ULTRASOUND (EUS) LINEAR;  Surgeon: Lemar Lofty., MD;  Location: [facility];  Service: Gastroenterology;  Laterality: N/A;   FOOT NEUROMA SURGERY  1985   WISDOM TOOTH EXTRACTION     Patient Active Problem List   Diagnosis Date Noted   Right upper quadrant abdominal mass 01/26/2023   Panic attacks 10/26/2022   Allergic reaction to insect bite 06/23/2022   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   Severely  underweight adult 09/07/2021   Acute diarrhea 04/27/2021   Inflamed sebaceous cyst 02/11/2021   Transient disorientation 10/20/2020   Memory change 08/05/2020   Family history of early CAD 02/27/2020   Tremor 02/27/2020   Aortic atherosclerosis (HCC) 12/19/2019   Urinary urgency 05/21/2019   Bladder irritation 05/21/2019   External hemorrhoid 05/21/2019   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   Raynaud's syndrome 02/09/2019   Osteoarthritis of thumb, 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   Fatigue 12/19/2017   Acute left-sided low back pain without sciatica 12/19/2017   SVT (supraventricular tachycardia) (HCC) 08/31/2017   Peripheral neuropathy 08/31/2017   Dry eyes 09/21/2015   Atopic dermatitis 03/13/2015   IBS (irritable bowel syndrome) 03/18/2014   Osteoporosis 03/18/2014   Major depressive disorder, recurrent episode, moderate (HCC) 03/18/2014   Hypothyroidism 12/14/2013    PCP: Excell Seltzer, MD  REFERRING PROVIDER: Sherre Scarlet, MD  REFERRING DIAG: M79.18 (ICD-10-CM) - Myalgia, other site G58.8 (ICD-10-CM) - Other specified mononeuropathies  THERAPY DIAG:  Other muscle spasm  Pelvic pain  Pain in left hip  Other low back pain  Muscle weakness (generalized)  Abnormal posture  Unspecified lack of coordination  Rationale for Evaluation and Treatment: Rehabilitation  ONSET DATE: 3 years   SUBJECTIVE:  SUBJECTIVE STATEMENT: Pt is now in ortho PT as well to work on hamstring. She states that she is having much more acute pain in Lt hamstring.    PAIN:  Are you having pain? Yes NPRS scale: 3/10 Pain location:  Lt hip and perineum/vulva  Pain type: soreness Pain description: intermittent   Aggravating factors: sitting Relieving factors: lying flat, sitting on toilet  PRECAUTIONS: Other:  large mass in Rt lower quadrant that has not been treated by medical team  RED FLAGS: None   WEIGHT BEARING RESTRICTIONS: No  FALLS:  Has patient fallen in last 6 months? No  LIVING ENVIRONMENT: Lives with: lives with their family Lives in: House/apartment  OCCUPATION: retired Runner, broadcasting/film/video  PLOF: Independent  PATIENT GOALS: decrease pain  PERTINENT HISTORY:  Interstitial cystitis  BOWEL MOVEMENT: Pain with bowel movement: No Type of bowel movement:Frequency 1x/day and Strain No Fully empty rectum: No Leakage: No - none currently Pads: No Fiber supplement: No  URINATION: Pain with urination: No Fully empty bladder: Yes: - Stream: Strong Urgency: Yes: have to run  Frequency: regular Leakage:  none Pads: No  INTERCOURSE: NA  PREGNANCY: Vaginal deliveries 2 Tearing No C-section deliveries 0 Currently pregnant No  PROLAPSE: None   OBJECTIVE:  Note: Objective measures were completed at Evaluation unless otherwise noted. 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, swelling of quadratus femoris muscle, tendinopathic and frayed hamstring origin Scheduled for anal manometry in April 2025   COGNITION: Overall cognitive status: Within functional limits for tasks assessed     SENSATION: Light touch: Appears intact Proprioception: Appears  intact   FUNCTIONAL/SPECIAL TESTS:  Single leg stance:  Rt: more stable, some Lt drop - compensated trendelenburg  Lt: less stable, more Rt drop - compensated trendelenburg Squat: Lt weight shift, Rt valgus knee collapse FAIR: (+) Lt hip Hamstring length: significant decrease with pain on Lt, -75 degrees Curl-up test: breath holding, but no distortion, 2 finger width diastasis in upper abdominals Bed mobility: breath holding  Palpable transversus abdominus contraction bil, but weak Tenderness at Lt ischial tuberosity and hamstrings/Lt posterolateral hip mm  GAIT: Comments: WNL  POSTURE: forward head, decreased lumbar lordosis, posterior pelvic tilt, and Lt lower thoracic/upper lumbar curvature, elevated Lt iliac crest  PELVIC ALIGNMENT: Lt posterior rotation, Lt sacral facing rotation   LUMBARAROM/PROM:  A/PROM A/PROM  Eval (% available)  Flexion 75, pain on LT  Extension 75  Right lateral flexion 100  Left lateral flexion 100, pain in back and Lt pelvis  Right rotation 75  Left rotation 50, feels good on back   (Blank rows = not tested)   LOWER EXTREMITY MMT:  MMT Right eval Left eval  Hip abduction 5/5 5/5  Hip adduction    Hip internal rotation 3/5 4-/5  Hip external rotation 3/5 4/5   PALPATION:   General: Large mass located in Rt abdomen lateral to umbilicus, non-tender, but with pressure it caused discomfort in low back - patient had not noticed before and reports no doctor has mentioned to her before    TODAY'S TREATMENT:                                                                                                                              DATE:  01/31/23 Neuromuscular re-education: Diaphragmatic breathing with multimodal cues for improved 360 expansion/lengthening Cat cow 2 x 10, 2 x 10 with hip internal rotation Quadruped rocking with hip internal rotation 2 x 10 Child's pose with hip internal rotation 20 breaths Happy baby 10 breaths Butterfly  10 breaths Exercises: Wide leg lower trunk rotation to emphasize hip internal rotation 2 x 10 Supine piriformis stretch 60 sec bil  01/26/23 Manual: Pt provides verbal consent for internal vaginal/rectal pelvic floor exam. Internal vaginal pelvic floor muscle exam Vaginal pelvic floor muscle release to deep Lt layers Trigger Point Dry Needling  Initial Treatment: Pt instructed on Dry Needling rational, procedures, and possible side effects. Pt instructed to expect mild to moderate muscle soreness later in the day and/or into the next day.  Pt instructed in methods to reduce muscle soreness. Pt instructed to continue prescribed HEP. Patient was educated on signs and symptoms of infection and other risk factors and advised to seek medical attention should they occur.  Patient verbalized understanding of these instructions and education.   Patient Verbal Consent Given: Yes Education Handout Provided: Yes Muscles Treated: Lt glute med Electrical Stimulation Performed: No Treatment Response/Outcome: twitch response and release Soft tissue mobilization to Lt glutes  01/24/23  EVAL    PATIENT EDUCATION:  Education details: See above Person educated: Patient Education method: Programmer, multimedia, Demonstration, Tactile cues, Verbal cues, and Handouts Education comprehension: verbalized understanding  HOME EXERCISE PROGRAM: [email or web address]  ASSESSMENT:  CLINICAL IMPRESSION: Pt unsure if treatment last session was helpful or not; she noticed more acute pain in Lt hamstring attachment. We discussed if she may have noticed this more because other pain was improved, but she was unsure. We performed training on diaphragmatic breathing today; she initially had difficulty with pushing her abdominals muscles out, but with multimodal cues made good improvements in allow inhale to expand muscle groups naturally, hopefully assisting with abdominal and pelvic floor muscle relaxation. She did well with all  mobility activities and down training today with no increase in pain. Several exercises focused on hip internal rotation to improve mobility and tension in external rotators and pelvic floor, hopefully reducing tension on pudendal nerve. She will continue to benefit from skilled PT intervention in order to decrease pelvic pain, improve low back/Lt hip pain, decrease urinary urgency, and begin/progress functional strengthening program.   OBJECTIVE IMPAIRMENTS: decreased activity tolerance, decreased  coordination, decreased endurance, decreased mobility, decreased strength, increased fascial restrictions, increased muscle spasms, impaired tone, postural dysfunction, and pain.   ACTIVITY LIMITATIONS: lifting, bending, sitting, squatting, and bed mobility  PARTICIPATION LIMITATIONS: cleaning, driving, and community activity  PERSONAL FACTORS: 1 comorbidity: medical history  are also affecting patient's functional outcome.   REHAB POTENTIAL: Good  CLINICAL DECISION MAKING: Evolving/moderate complexity  EVALUATION COMPLEXITY: Moderate   GOALS: Goals reviewed with patient? Yes  SHORT TERM GOALS: Target date: 02/21/23  Pt will be independent with HEP.   Baseline: Goal status: INITIAL  2.  Pt will be able to teach back and utilize urge suppression technique in order to help reduce number of trips to the bathroom.    Baseline:  Goal status: INITIAL  3.  Pt will begin use of pelvic floor muscle wand on regular basis (2-3x/week) in order to help reduce pelvic floor muscle tension and pain. Baseline:  Goal status: INITIAL  4.  Pt will be independent with diaphragmatic breathing and down training activities in order to improve pelvic floor relaxation.  Baseline:  Goal status: INITIAL  5.  Pt will report pelvic pain no greater than 4/10. Baseline:  Goal status: INITIAL   LONG TERM GOALS: Target date: 03/21/23  Pt will be independent with advanced HEP.   Baseline:  Goal status:  INITIAL  2.  Pt will be able to go 2-3 hours in between voids without urgency or incontinence in order to improve QOL and perform all functional activities with less difficulty.   Baseline:  Goal status: INITIAL  3.  Pt will report pelvic pain no greater than 2/10.  Baseline:  Goal status: INITIAL  4.  Pt will be able to sit for >30 minutes without any increased pelvic pain.  Baseline:  Goal status: INITIAL  5.  Pt will be able to stand on single leg without pelvic drop and squat without weight shift in order to demonstrate improved functional core and hip strength to more appropriately stabilize pelvis.  Baseline:  Goal status: INITIAL  6.  Pt will demonstrate increase in all impaired hip strength by 1 muscle grades in order to demonstrate improved lumbopelvic support and increase functional ability.   Baseline:  Goal status: INITIAL  PLAN:  PT FREQUENCY: 1-2x/week  PT DURATION: 8 weeks  PLANNED INTERVENTIONS: 97110-Therapeutic exercises, 97530- Therapeutic activity, 97112- Neuromuscular re-education, 97535- Self Care, [id number]- Manual therapy, Dry Needling, and Biofeedback  PLAN FOR NEXT SESSION: test hamstring strength, internal pelvic floor muscle exam and treatment to tolerance, initial HEP   Julio Alm, PT, DPT01/21/2510:56 AM

## 2023-02-01 ENCOUNTER — Ambulatory Visit
Admission: RE | Admit: 2023-02-01 | Discharge: 2023-02-01 | Discharge status: Home / Self Care | Payer: Medicare Other | Source: Ambulatory Visit | Attending: Family Medicine | Admitting: Family Medicine

## 2023-02-01 DIAGNOSIS — R1901 Right upper quadrant abdominal swelling, mass and lump: Secondary | ICD-10-CM

## 2023-02-02 ENCOUNTER — Ambulatory Visit: Payer: Medicare Other

## 2023-02-02 ENCOUNTER — Encounter: Payer: Self-pay | Admitting: Family Medicine

## 2023-02-02 ENCOUNTER — Ambulatory Visit: Payer: Medicare Other | Admitting: Physical Therapy

## 2023-02-02 ENCOUNTER — Encounter: Payer: Self-pay | Admitting: Physical Therapy

## 2023-02-02 DIAGNOSIS — M62838 Other muscle spasm: Secondary | ICD-10-CM

## 2023-02-02 DIAGNOSIS — Q6102 Congenital multiple renal cysts: Secondary | ICD-10-CM

## 2023-02-02 DIAGNOSIS — M5459 Other low back pain: Secondary | ICD-10-CM

## 2023-02-02 DIAGNOSIS — R102 Pelvic and perineal pain: Secondary | ICD-10-CM

## 2023-02-02 DIAGNOSIS — M25552 Pain in left hip: Secondary | ICD-10-CM

## 2023-02-02 DIAGNOSIS — M5416 Radiculopathy, lumbar region: Secondary | ICD-10-CM | POA: Diagnosis not present

## 2023-02-02 DIAGNOSIS — M6281 Muscle weakness (generalized): Secondary | ICD-10-CM

## 2023-02-02 DIAGNOSIS — G588 Other specified mononeuropathies: Secondary | ICD-10-CM | POA: Diagnosis not present

## 2023-02-02 DIAGNOSIS — R293 Abnormal posture: Secondary | ICD-10-CM

## 2023-02-02 DIAGNOSIS — R279 Unspecified lack of coordination: Secondary | ICD-10-CM

## 2023-02-02 NOTE — Therapy (Signed)
OUTPATIENT PHYSICAL THERAPY FEMALE PELVIC TREATMENT   Patient Name: Jessica Miles MRN: [id number] DOB:1945-11-13, 78 y.o., female Today's Date: 02/02/2023  END OF SESSION:  PT End of Session - 02/02/23 1102     Visit Number 6    Date for PT Re-Evaluation 03/21/23    Authorization Type Medicare - needs Kx at 15    Progress Note Due on Visit 10    PT Start Time 1102    PT Stop Time 1142    PT Time Calculation (min) 40 min    Activity Tolerance Patient tolerated treatment well    Behavior During Therapy [facility] for tasks assessed/performed             Past Medical History:  Diagnosis Date   Allergy    Anxiety    COVID-19 virus infection    09-23-2019- 9-16 had monoclonal infusion    Depression    History of IBS    HPV (human papilloma virus) infection    Hyperlipidemia    83- not taking statin    Loss of taste    Neuropathy 2015   surgery for Mortons neuroma in 1985   Osteopenia    Osteoporosis    Raynaud's disease 02/2020   Tachycardia    Thyroid disease    Past Surgical History:  Procedure Laterality Date   BIOPSY  01/25/2022   Procedure: BIOPSY;  Surgeon: Lemar Lofty., MD;  Location: [facility];  Service: Gastroenterology;;   COLONOSCOPY  2019   hems only    ESOPHAGOGASTRODUODENOSCOPY (EGD) WITH PROPOFOL N/A 01/25/2022   Procedure: ESOPHAGOGASTRODUODENOSCOPY (EGD) WITH PROPOFOL;  Surgeon: Lemar Lofty., MD;  Location: [facility];  Service: Gastroenterology;  Laterality: N/A;   EUS N/A 01/25/2022   Procedure: UPPER ENDOSCOPIC ULTRASOUND (EUS) LINEAR;  Surgeon: Lemar Lofty., MD;  Location: [facility];  Service: Gastroenterology;  Laterality: N/A;   FOOT NEUROMA SURGERY  1985   WISDOM TOOTH EXTRACTION     Patient Active Problem List   Diagnosis Date Noted   Right upper quadrant abdominal mass 01/26/2023   Panic attacks 10/26/2022   Allergic reaction to insect bite 06/23/2022   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   Severely underweight  adult 09/07/2021   Acute diarrhea 04/27/2021   Inflamed sebaceous cyst 02/11/2021   Transient disorientation 10/20/2020   Memory change 08/05/2020   Family history of early CAD 02/27/2020   Tremor 02/27/2020   Aortic atherosclerosis (HCC) 12/19/2019   Urinary urgency 05/21/2019   Bladder irritation 05/21/2019   External hemorrhoid 05/21/2019   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   Raynaud's syndrome 02/09/2019   Osteoarthritis of thumb, 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   Fatigue 12/19/2017   Acute left-sided low back pain without sciatica 12/19/2017   SVT (supraventricular tachycardia) (HCC) 08/31/2017   Peripheral neuropathy 08/31/2017   Dry eyes 09/21/2015   Atopic dermatitis 03/13/2015   IBS (irritable bowel syndrome) 03/18/2014   Osteoporosis 03/18/2014   Major depressive disorder, recurrent episode, moderate (HCC) 03/18/2014   Hypothyroidism 12/14/2013    PCP: Excell Seltzer, MD  REFERRING PROVIDER: Sherre Scarlet, MD  REFERRING DIAG: M79.18 (ICD-10-CM) - Myalgia, other site G58.8 (ICD-10-CM) - Other specified mononeuropathies  THERAPY DIAG:  Other muscle spasm  Pain in left hip  Muscle weakness (generalized)  Other low back pain  Pelvic pain  Abnormal posture  Unspecified lack of coordination  Rationale for Evaluation and Treatment: Rehabilitation  ONSET DATE: 3 years   SUBJECTIVE:  SUBJECTIVE STATEMENT: Pt states that she was able to get [place] on abdomen this week and is waiting on results. She felt like she has had trigger point in Lt glutes and massager was very helpful in reducing. She is uncertain if vulvar irritation is any better due to not biking, which usually aggravates symptoms, and Lt hip pain over riding discomfort.    PAIN:  Are you having pain? Yes NPRS scale: 1.5/10 Pain  location:  Lt hip and perineum/vulva  Pain type: soreness Pain description: intermittent   Aggravating factors: sitting Relieving factors: lying flat, sitting on toilet  PRECAUTIONS: Other: large mass in Rt lower quadrant that has not been treated by medical team  RED FLAGS: None   WEIGHT BEARING RESTRICTIONS: No  FALLS:  Has patient fallen in last 6 months? No  LIVING ENVIRONMENT: Lives with: lives with their family Lives in: House/apartment  OCCUPATION: retired Runner, broadcasting/film/video  PLOF: Independent  PATIENT GOALS: decrease pain  PERTINENT HISTORY:  Interstitial cystitis  BOWEL MOVEMENT: Pain with bowel movement: No Type of bowel movement:Frequency 1x/day and Strain No Fully empty rectum: No Leakage: No - none currently Pads: No Fiber supplement: No  URINATION: Pain with urination: No Fully empty bladder: Yes: - Stream: Strong Urgency: Yes: have to run  Frequency: regular Leakage:  none Pads: No  INTERCOURSE: NA  PREGNANCY: Vaginal deliveries 2 Tearing No C-section deliveries 0 Currently pregnant No  PROLAPSE: None   OBJECTIVE:  Note: Objective measures were completed at Evaluation unless otherwise noted. 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, swelling of quadratus femoris muscle, tendinopathic and frayed hamstring origin Scheduled for anal  manometry in April 2025   COGNITION: Overall cognitive status: Within functional limits for tasks assessed     SENSATION: Light touch: Appears intact Proprioception: Appears intact   FUNCTIONAL/SPECIAL TESTS:  Single leg stance:  Rt: more stable, some Lt drop - compensated trendelenburg  Lt: less stable, more Rt drop - compensated trendelenburg Squat: Lt weight shift, Rt valgus knee collapse FAIR: (+) Lt hip Hamstring length: significant decrease with pain on Lt, -75 degrees Curl-up test: breath holding, but no distortion, 2 finger width diastasis in upper abdominals Bed mobility: breath holding  Palpable transversus abdominus contraction bil, but weak Tenderness at Lt ischial tuberosity and hamstrings/Lt posterolateral hip mm  GAIT: Comments: WNL  POSTURE: forward head, decreased lumbar lordosis, posterior pelvic tilt, and Lt lower thoracic/upper lumbar curvature, elevated Lt iliac crest  PELVIC ALIGNMENT: Lt posterior rotation, Lt sacral facing rotation   LUMBARAROM/PROM:  A/PROM A/PROM  Eval (% available)  Flexion 75, pain on LT  Extension 75  Right lateral flexion 100  Left lateral flexion 100, pain in  back and Lt pelvis  Right rotation 75  Left rotation 50, feels good on back   (Blank rows = not tested)   LOWER EXTREMITY MMT:  MMT Right eval Left eval  Hip abduction 5/5 5/5  Hip adduction    Hip internal rotation 3/5 4-/5  Hip external rotation 3/5 4/5   PALPATION:   General: Large mass located in Rt abdomen lateral to umbilicus, non-tender, but with pressure it caused discomfort in low back - patient had not noticed before and reports no doctor has mentioned to her before    TODAY'S TREATMENT:                                                                                                                              DATE:  02/02/23 Manual: Pt provides verbal consent for internal vaginal/rectal pelvic floor exam. Internal vaginal pelvic floor muscle  exam Vaginal pelvic floor muscle release to deep Lt layers Trigger Point Dry Needling  Initial Treatment: Pt instructed on Dry Needling rational, procedures, and possible side effects. Pt instructed to expect mild to moderate muscle soreness later in the day and/or into the next day.  Pt instructed in methods to reduce muscle soreness. Pt instructed to continue prescribed HEP. Patient was educated on signs and symptoms of infection and other risk factors and advised to seek medical attention should they occur.  Patient verbalized understanding of these instructions and education.   Patient Verbal Consent Given: Yes Education Handout Provided: Yes Muscles Treated: Lt glute med, proximal adductors, hip flexor, pectineus, and disotal iliopsoas insertion Electrical Stimulation Performed: No Treatment Response/Outcome: twitch response and release Soft tissue mobilization to Lt glutes Lt side lying external obturator internus release  Soft tissue mobilization to Lt adductors/hip flexors   01/31/23 Neuromuscular re-education: Diaphragmatic breathing with multimodal cues for improved 360 expansion/lengthening Cat cow 2 x 10, 2 x 10 with hip internal rotation Quadruped rocking with hip internal rotation 2 x 10 Child's pose with hip internal rotation 20 breaths Happy baby 10 breaths Butterfly 10 breaths Exercises: Wide leg lower trunk rotation to emphasize hip internal rotation 2 x 10 Supine piriformis stretch 60 sec bil  01/26/23 Manual: Pt provides verbal consent for internal vaginal/rectal pelvic floor exam. Internal vaginal pelvic floor muscle exam Vaginal pelvic floor muscle release to deep Lt layers Trigger Point Dry Needling  Initial Treatment: Pt instructed on Dry Needling rational, procedures, and possible side effects. Pt instructed to expect mild to moderate muscle soreness later in the day and/or into the next day.  Pt instructed in methods to reduce muscle soreness. Pt  instructed to continue prescribed HEP. Patient was educated on signs and symptoms of infection and other risk factors and advised to seek medical attention should they occur.  Patient verbalized understanding of these instructions and education.   Patient Verbal Consent Given: Yes Education Handout Provided: Yes Muscles Treated: Lt glute med Electrical Stimulation Performed: No Treatment Response/Outcome: twitch response and  release Soft tissue mobilization to Lt glutes  PATIENT EDUCATION:  Education details: See above Person educated: Patient Education method: Explanation, Demonstration, Tactile cues, Verbal cues, and Handouts Education comprehension: verbalized understanding  HOME EXERCISE PROGRAM: [email or web address]  ASSESSMENT:  CLINICAL IMPRESSION: Pt is doing a little better with pain today after doing some self-massage in Lt glutes last night. However, we did perform dry needling to trigger points in this area to hopefully resolve restriction here and allow for improved mobility and strengthening. We also performed dry needling to anterior Lt hip due to restriction that may likely be due to labral tear and also impacting pelvic floor muscle. She tolerated this all very well. To help more directly release pudendal nerve tension, external release performed to Lt obturator internus; significant muscular and fascial restriction present some some improvement in mobility. She was instructed to continue her same exercises at this time. She will continue to benefit from skilled PT intervention in order to decrease pelvic pain, improve low back/Lt hip pain, decrease urinary urgency, and begin/progress functional strengthening program.   OBJECTIVE IMPAIRMENTS: decreased activity tolerance, decreased coordination, decreased endurance, decreased mobility, decreased strength, increased fascial restrictions, increased muscle spasms, impaired tone, postural dysfunction, and pain.   ACTIVITY LIMITATIONS:  lifting, bending, sitting, squatting, and bed mobility  PARTICIPATION LIMITATIONS: cleaning, driving, and community activity  PERSONAL FACTORS: 1 comorbidity: medical history  are also affecting patient's functional outcome.   REHAB POTENTIAL: Good  CLINICAL DECISION MAKING: Evolving/moderate complexity  EVALUATION COMPLEXITY: Moderate   GOALS: Goals reviewed with patient? Yes  SHORT TERM GOALS: Target date: 02/21/23  Pt will be independent with HEP.   Baseline: Goal status: INITIAL  2.  Pt will be able to teach back and utilize urge suppression technique in order to help reduce number of trips to the bathroom.    Baseline:  Goal status: INITIAL  3.  Pt will begin use of pelvic floor muscle wand on regular basis (2-3x/week) in order to help reduce pelvic floor muscle tension and pain. Baseline:  Goal status: INITIAL  4.  Pt will be independent with diaphragmatic breathing and down training activities in order to improve pelvic floor relaxation.  Baseline:  Goal status: INITIAL  5.  Pt will report pelvic pain no greater than 4/10. Baseline:  Goal status: INITIAL   LONG TERM GOALS: Target date: 03/21/23  Pt will be independent with advanced HEP.   Baseline:  Goal status: INITIAL  2.  Pt will be able to go 2-3 hours in between voids without urgency or incontinence in order to improve QOL and perform all functional activities with less difficulty.   Baseline:  Goal status: INITIAL  3.  Pt will report pelvic pain no greater than 2/10.  Baseline:  Goal status: INITIAL  4.  Pt will be able to sit for >30 minutes without any increased pelvic pain.  Baseline:  Goal status: INITIAL  5.  Pt will be able to stand on single leg without pelvic drop and squat without weight shift in order to demonstrate improved functional core and hip strength to more appropriately stabilize pelvis.  Baseline:  Goal status: INITIAL  6.  Pt will demonstrate increase in all impaired  hip strength by 1 muscle grades in order to demonstrate improved lumbopelvic support and increase functional ability.   Baseline:  Goal status: INITIAL  PLAN:  PT FREQUENCY: 1-2x/week  PT DURATION: 8 weeks  PLANNED INTERVENTIONS: 97110-Therapeutic exercises, 97530- Therapeutic activity, [id number]- Neuromuscular re-education, 97535- Self Care, [id number]- Manual  therapy, Dry Needling, and Biofeedback  PLAN FOR NEXT SESSION: test hamstring strength, internal pelvic floor muscle exam and treatment to tolerance   Julio Alm, PT, DPT01/23/2511:46 AM

## 2023-02-02 NOTE — Therapy (Signed)
OUTPATIENT PHYSICAL THERAPY THORACOLUMBAR TREATMENT   Patient Name: Jessica Miles MRN: [id number] DOB:11/02/1945, 78 y.o., female Today's Date: 02/02/2023  END OF SESSION:  PT End of Session - 02/02/23 0920     Visit Number 5    Date for PT Re-Evaluation 03/28/23    Authorization Type Medicare - needs Kx at 15    Progress Note Due on Visit 10    PT Start Time 0831    PT Stop Time 0918    PT Time Calculation (min) 47 min    Activity Tolerance Patient tolerated treatment well    Behavior During Therapy [facility] for tasks assessed/performed              Past Medical History:  Diagnosis Date   Allergy    Anxiety    COVID-19 virus infection    09-23-2019- 9-16 had monoclonal infusion    Depression    History of IBS    HPV (human papilloma virus) infection    Hyperlipidemia    83- not taking statin    Loss of taste    Neuropathy 2015   surgery for Mortons neuroma in 1985   Osteopenia    Osteoporosis    Raynaud's disease 02/2020   Tachycardia    Thyroid disease    Past Surgical History:  Procedure Laterality Date   BIOPSY  01/25/2022   Procedure: BIOPSY;  Surgeon: Lemar Lofty., MD;  Location: [facility];  Service: Gastroenterology;;   COLONOSCOPY  2019   hems only    ESOPHAGOGASTRODUODENOSCOPY (EGD) WITH PROPOFOL N/A 01/25/2022   Procedure: ESOPHAGOGASTRODUODENOSCOPY (EGD) WITH PROPOFOL;  Surgeon: Lemar Lofty., MD;  Location: [facility];  Service: Gastroenterology;  Laterality: N/A;   EUS N/A 01/25/2022   Procedure: UPPER ENDOSCOPIC ULTRASOUND (EUS) LINEAR;  Surgeon: Lemar Lofty., MD;  Location: [facility];  Service: Gastroenterology;  Laterality: N/A;   FOOT NEUROMA SURGERY  1985   WISDOM TOOTH EXTRACTION     Patient Active Problem List   Diagnosis Date Noted   Right upper quadrant abdominal mass 01/26/2023   Panic attacks 10/26/2022   Allergic reaction to insect bite 06/23/2022   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   Severely underweight  adult 09/07/2021   Acute diarrhea 04/27/2021   Inflamed sebaceous cyst 02/11/2021   Transient disorientation 10/20/2020   Memory change 08/05/2020   Family history of early CAD 02/27/2020   Tremor 02/27/2020   Aortic atherosclerosis (HCC) 12/19/2019   Urinary urgency 05/21/2019   Bladder irritation 05/21/2019   External hemorrhoid 05/21/2019   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   Raynaud's syndrome 02/09/2019   Osteoarthritis of thumb, 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   Fatigue 12/19/2017   Acute left-sided low back pain without sciatica 12/19/2017   SVT (supraventricular tachycardia) (HCC) 08/31/2017   Peripheral neuropathy 08/31/2017   Dry eyes 09/21/2015   Atopic dermatitis 03/13/2015   IBS (irritable bowel syndrome) 03/18/2014   Osteoporosis 03/18/2014   Major depressive disorder, recurrent episode, moderate (HCC) 03/18/2014   Hypothyroidism 12/14/2013    PCP: Kerby Nora, MD  REFERRING PROVIDER: Juanell Fairly, MD  REFERRING DIAG: Lumbar radiculopathy, strain of muscle, fascia and tendon of the posterior muscle group of the thigh level   Rationale for Evaluation and Treatment: Rehabilitation  THERAPY DIAG:  Other muscle spasm  Pain in left hip  Muscle weakness (generalized)  Other low back pain  ONSET DATE: 2 months ago (flare up) after chronic/intermittent   SUBJECTIVE:  SUBJECTIVE STATEMENT: Patient reports she had increased Rt hip pain yesterday after her exercise class yesterday. She used her massage gun and that helped her pain.   From Eval: Pt presents to PT with Lt LE pain that began December 2024.  She has had this pain before and had PT ~1.5 years ago.  Imaging shows labral tear of the left hip, ischiofemoral impingement, tendiopathic and frayed hamstring origin.  She is active in pelvic  PT for pudendal nerve entrapment.   PERTINENT HISTORY:  Depression, osteopenia/porosis,Raynaud's, Lt hip labral tear and frayed hamstring origin, IBS  PAIN: 02/02/23 Are you having pain? Yes: NPRS scale: 1.5/10  Pain location: Lt hamstring insertion and gluteal  Pain description: aching, constant  Aggravating factors: sitting long periods, after exercise Relieving factors: exercising (during)  PRECAUTIONS: None  RED FLAGS: None   WEIGHT BEARING RESTRICTIONS: No  FALLS:  Has patient fallen in last 6 months? No  LIVING ENVIRONMENT: Lives with: lives with their spouse Lives in: House/apartment Stairs: No Has following equipment at home: None  OCCUPATION: retired Runner, broadcasting/film/video   PLOF: Independent and Leisure: exercises, walking/biking, reading   PATIENT GOALS: reduce Lt LE pain, sit without pain   NEXT MD VISIT: none scheduled   OBJECTIVE:  Note: Objective measures were completed at Evaluation unless otherwise noted.  DIAGNOSTIC FINDINGS:  10/2021 MRI Lt hip:  Mild Lt hip osteoarthropathy, labral tear of the left hip, ischiofemoral impingement, tendiopathic and frayed hamstring origin   PATIENT SURVEYS:  LEFS 60/64 (pt didn't answer questions 16-19 due to NA)  COGNITION: Overall cognitive status: Within functional limits for tasks assessed     SENSATION: WFL  MUSCLE LENGTH: Limited IR/ER bil hips by 25%.    POSTURE: rounded shoulders and forward head  PALPATION: Trigger points in Lt gluteals and reduced spinal mobility with PA Mobs in thoracic and lumbar spine  LUMBAR ROM:  Full lumbar A/ROM without pain  LOWER EXTREMITY ROM:      LOWER EXTREMITY MMT:    LOWER EXTREMITY MMT:   MMT Right eval Left eval  Hip abduction 5/5 5/5  Hip adduction      Hip internal rotation 3/5 4-/5  Hip external rotation 3/5 4/5     GAIT: Distance walked: 75 Assistive device utilized: None Level of assistance: Complete Independence Comments: mild scissoring  bil Step-ups: Rt and Lt with hip adduction bil  TREATMENT DATE:  02/02/2023 NuStep Level 5 6 mins- PT present to discuss status Seated figure 4 stretch 2 x 30sec  Clamshells 2 x 10 bilateral  Reverse Clamshell 2 x 10 bilateral  Forward T at counter 2 x 20 sec Foam folling for glutes & hamstring x 2 mins Bridging 2 x 10 Squat taps to chair 8# DB hold 2 x 10 Hip flexion march holding 8# DB x 20 each direction Patient education on different foam rollers Hip Matrix (flexion & abduction) 2 x 10 bilateral 25# Hip flexor/ QL stretch on stair 2 x 20 sec     01/31/23 HEP established: see below  PATIENT EDUCATION:  Education details: Access Code: [email or web address] Person educated: Patient Education method: Explanation, Demonstration, and Handouts Education comprehension: verbalized understanding and returned demonstration  HOME EXERCISE PROGRAM: Access Code: [email or web address] URL: [email or web address] Date: 02/02/2023 Prepared by: Claude Manges  Exercises - Seated Figure 4 Piriformis Stretch  - 2-3 x daily - 7 x weekly - 3 sets - 10 reps - Clamshell  - 1 x daily - 7 x weekly - 2 sets - 10 reps - Sidelying Reverse Clamshell  - 1 x daily - 7 x weekly - 2 sets - 10 reps - Forward T with Counter Support  - 1 x daily - 7 x weekly - 1 sets - 10 reps - Standing Hip Flexion March  - 1 x daily - 7 x weekly - 2 sets - 10 reps - Supine Bridge  - 1 x daily - 7 x weekly - 2 sets - 10 reps - Half Kneeling Hip Flexor Stretch with Sidebend  - 1 x daily - 7 x weekly - 3 sets - 10 reps  ASSESSMENT:  CLINICAL IMPRESSION: Today's treatment session focused on hip stability. Robert has been compliant with HEP and required minimal verbal cues for form correction. Patient tolerated treatment session well. Educated patient in different foam rollers that are available and which would be  helpful for her. Patient required verbal and visual cues for correct exercise performance. Updated patient's HEP to include more hip and core strengthening exercises. Patient will benefit from skilled PT to address the below impairments and improve overall function.   OBJECTIVE IMPAIRMENTS: decreased ROM, decreased strength, increased fascial restrictions, increased muscle spasms, impaired flexibility, and pain.   ACTIVITY LIMITATIONS: sitting  PARTICIPATION LIMITATIONS: driving  PERSONAL FACTORS: Age and 1-2 comorbidities: IBS, Lt hip labral tear and hamstring tear  are also affecting patient's functional outcome.   REHAB POTENTIAL: Good  CLINICAL DECISION MAKING: Stable/uncomplicated  EVALUATION COMPLEXITY: Low   GOALS: Goals reviewed with patient? Yes  SHORT TERM GOALS: Target date: 02/28/2023    Be independent in initial HEP Baseline: Goal status: INITIAL  2.  Report > or = to 30% reduction in Lt hip and gluteal pain with sitting  Baseline: 2-5/10 Goal status: INITIAL    LONG TERM GOALS: Target date: 03/28/2023    Be independent in advanced HEP Baseline:  Goal status: INITIAL  2.  Report > or = to 60% reduction in Lt hip and gluteal pain with sitting  Baseline:  Goal status: INITIAL  3.  Improve LEFS to > or = to 63/64 to improve function  Baseline:  Goal status: INITIAL  4.  Verbalie and demonstrate self-care strategies to manage pain including tissue mobility practices and change of position Baseline:  Goal status: INITIAL    PLAN:  PT FREQUENCY: 2x/week  PT DURATION: 8 weeks  PLANNED INTERVENTIONS: 97110-Therapeutic exercises, 97530- Therapeutic activity, 97112- Neuromuscular re-education, 97535- Self Care, 21308- Manual therapy, [id number]- Gait training, [phone]- Canalith repositioning, [id number]- Aquatic Therapy, [id number]- Electrical stimulation (unattended), [id number]- Electrical stimulation (manual), [id number]- Traction (mechanical), Patient/Family education, Balance  training, Stair training, Taping, Dry Needling, Joint mobilization, Joint manipulation, Spinal manipulation, Vestibular training, Cryotherapy, and Moist heat.  PLAN FOR NEXT SESSION: assess updated HEP; single limb stability, leg press; DN for Lt hamstring    Claude Manges, PT 02/02/23 9:22 AM [facility] [address], [address] [place], [place] [address] Phone # [phone] Fax [phone]

## 2023-02-03 ENCOUNTER — Other Ambulatory Visit: Payer: Self-pay | Admitting: Family Medicine

## 2023-02-03 MED ORDER — ESTRADIOL 0.1 MG/GM VA CREA: TOPICAL_CREAM | VAGINAL | 12 refills | Status: DC

## 2023-02-06 ENCOUNTER — Ambulatory Visit: Payer: Medicare Other | Admitting: Physical Therapy

## 2023-02-06 ENCOUNTER — Encounter: Payer: Self-pay | Admitting: Physical Therapy

## 2023-02-06 ENCOUNTER — Ambulatory Visit: Payer: Medicare Other

## 2023-02-06 ENCOUNTER — Encounter: Payer: Self-pay | Admitting: Family Medicine

## 2023-02-06 DIAGNOSIS — R279 Unspecified lack of coordination: Secondary | ICD-10-CM

## 2023-02-06 DIAGNOSIS — M6281 Muscle weakness (generalized): Secondary | ICD-10-CM

## 2023-02-06 DIAGNOSIS — M5459 Other low back pain: Secondary | ICD-10-CM

## 2023-02-06 DIAGNOSIS — M62838 Other muscle spasm: Secondary | ICD-10-CM

## 2023-02-06 DIAGNOSIS — G588 Other specified mononeuropathies: Secondary | ICD-10-CM | POA: Diagnosis not present

## 2023-02-06 DIAGNOSIS — M25552 Pain in left hip: Secondary | ICD-10-CM

## 2023-02-06 DIAGNOSIS — R102 Pelvic and perineal pain: Secondary | ICD-10-CM

## 2023-02-06 DIAGNOSIS — M5416 Radiculopathy, lumbar region: Secondary | ICD-10-CM | POA: Diagnosis not present

## 2023-02-06 DIAGNOSIS — R293 Abnormal posture: Secondary | ICD-10-CM

## 2023-02-06 NOTE — Therapy (Signed)
OUTPATIENT PHYSICAL THERAPY THORACOLUMBAR TREATMENT   Patient Name: Jessica Miles MRN: [id number] DOB:1945/12/30, 78 y.o., female Today's Date: 02/06/2023  END OF SESSION:  PT End of Session - 02/06/23 0938     Visit Number 7    Date for PT Re-Evaluation 03/28/23    Authorization Type Medicare - needs Kx at 15    Progress Note Due on Visit 10    PT Start Time 0847    PT Stop Time 0931    PT Time Calculation (min) 44 min    Activity Tolerance Patient tolerated treatment well    Behavior During Therapy [facility] for tasks assessed/performed               Past Medical History:  Diagnosis Date   Allergy    Anxiety    COVID-19 virus infection    09-23-2019- 9-16 had monoclonal infusion    Depression    History of IBS    HPV (human papilloma virus) infection    Hyperlipidemia    82- not taking statin    Loss of taste    Neuropathy 2015   surgery for Mortons neuroma in 1985   Osteopenia    Osteoporosis    Raynaud's disease 02/2020   Tachycardia    Thyroid disease    Past Surgical History:  Procedure Laterality Date   BIOPSY  01/25/2022   Procedure: BIOPSY;  Surgeon: Lemar Lofty., MD;  Location: [facility];  Service: Gastroenterology;;   COLONOSCOPY  2019   hems only    ESOPHAGOGASTRODUODENOSCOPY (EGD) WITH PROPOFOL N/A 01/25/2022   Procedure: ESOPHAGOGASTRODUODENOSCOPY (EGD) WITH PROPOFOL;  Surgeon: Lemar Lofty., MD;  Location: [facility];  Service: Gastroenterology;  Laterality: N/A;   EUS N/A 01/25/2022   Procedure: UPPER ENDOSCOPIC ULTRASOUND (EUS) LINEAR;  Surgeon: Lemar Lofty., MD;  Location: [facility];  Service: Gastroenterology;  Laterality: N/A;   FOOT NEUROMA SURGERY  1985   WISDOM TOOTH EXTRACTION     Patient Active Problem List   Diagnosis Date Noted   Right upper quadrant abdominal mass 01/26/2023   Panic attacks 10/26/2022   Allergic reaction to insect bite 06/23/2022   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   Severely  underweight adult 09/07/2021   Acute diarrhea 04/27/2021   Inflamed sebaceous cyst 02/11/2021   Transient disorientation 10/20/2020   Memory change 08/05/2020   Family history of early CAD 02/27/2020   Tremor 02/27/2020   Aortic atherosclerosis (HCC) 12/19/2019   Urinary urgency 05/21/2019   Bladder irritation 05/21/2019   External hemorrhoid 05/21/2019   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   Raynaud's syndrome 02/09/2019   Osteoarthritis of thumb, 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   Fatigue 12/19/2017   Acute left-sided low back pain without sciatica 12/19/2017   SVT (supraventricular tachycardia) (HCC) 08/31/2017   Peripheral neuropathy 08/31/2017   Dry eyes 09/21/2015   Atopic dermatitis 03/13/2015   IBS (irritable bowel syndrome) 03/18/2014   Osteoporosis 03/18/2014   Major depressive disorder, recurrent episode, moderate (HCC) 03/18/2014   Hypothyroidism 12/14/2013    PCP: Kerby Nora, MD  REFERRING PROVIDER: Juanell Fairly, MD  REFERRING DIAG: Lumbar radiculopathy, strain of muscle, fascia and tendon of the posterior muscle group of the thigh level   Rationale for Evaluation and Treatment: Rehabilitation  THERAPY DIAG:  Other muscle spasm  Pain in left hip  Muscle weakness (generalized)  Other low back pain  ONSET DATE: 2 months ago (flare up) after chronic/intermittent   SUBJECTIVE:  SUBJECTIVE STATEMENT: Patient reports she may have over did her exercises on Friday. She was in increased pain on Saturday and did not do any activity.    From Eval: Pt presents to PT with Lt LE pain that began December 2024.  She has had this pain before and had PT ~1.5 years ago.  Imaging shows labral tear of the left hip, ischiofemoral impingement, tendiopathic and frayed hamstring origin.  She is active in pelvic  PT for pudendal nerve entrapment.   PERTINENT HISTORY:  Depression, osteopenia/porosis,Raynaud's, Lt hip labral tear and frayed hamstring origin, IBS  PAIN: 02/06/23 Are you having pain? Yes: NPRS scale: 2-3/10  Pain location: Lt hamstring insertion and gluteal  Pain description: aching, constant  Aggravating factors: sitting long periods, after exercise Relieving factors: exercising (during)  PRECAUTIONS: None  RED FLAGS: None   WEIGHT BEARING RESTRICTIONS: No  FALLS:  Has patient fallen in last 6 months? No  LIVING ENVIRONMENT: Lives with: lives with their spouse Lives in: House/apartment Stairs: No Has following equipment at home: None  OCCUPATION: retired Runner, broadcasting/film/video   PLOF: Independent and Leisure: exercises, walking/biking, reading   PATIENT GOALS: reduce Lt LE pain, sit without pain   NEXT MD VISIT: none scheduled   OBJECTIVE:  Note: Objective measures were completed at Evaluation unless otherwise noted.  DIAGNOSTIC FINDINGS:  10/2021 MRI Lt hip:  Mild Lt hip osteoarthropathy, labral tear of the left hip, ischiofemoral impingement, tendiopathic and frayed hamstring origin   PATIENT SURVEYS:  LEFS 60/64 (pt didn't answer questions 16-19 due to NA)  COGNITION: Overall cognitive status: Within functional limits for tasks assessed     SENSATION: WFL  MUSCLE LENGTH: Limited IR/ER bil hips by 25%.    POSTURE: rounded shoulders and forward head  PALPATION: Trigger points in Lt gluteals and reduced spinal mobility with PA Mobs in thoracic and lumbar spine  LUMBAR ROM:  Full lumbar A/ROM without pain  LOWER EXTREMITY ROM:      LOWER EXTREMITY MMT:    LOWER EXTREMITY MMT:   MMT Right eval Left eval  Hip abduction 5/5 5/5  Hip adduction      Hip internal rotation 3/5 4-/5  Hip external rotation 3/5 4/5     GAIT: Distance walked: 75 Assistive device utilized: None Level of assistance: Complete Independence Comments: mild scissoring  bil Step-ups: Rt and Lt with hip adduction bil  TREATMENT DATE:  02/06/2023 NuStep Level 3 6 mins- PT present to discuss status Form correction for QL stretch Hip Matrix (flexion & abduction) 2 x 10 bilateral 25# Leg Press seat 7 65# with blue loop around knees 2 x 10 bilateral ; 35# unilateral x 20 6 in Step up + knee drive 5# KB 2 x 10 bilateral  Sit stepping with blue loop around knees x 4 Single leg deadlifts 5# KB 2 x 10 bilateral     02/02/2023 NuStep Level 5 6 mins- PT present to discuss status Seated figure 4 stretch 2 x 30sec  Clamshells 2 x 10 bilateral  Reverse Clamshell 2 x 10 bilateral  Forward T at counter 2 x 20 sec Foam folling for glutes & hamstring x 2 mins Bridging 2 x 10 Squat taps to chair 8# DB hold 2 x 10 Hip flexion march holding 8# DB x 20 each direction Patient education on different foam rollers Hip Matrix (flexion & abduction) 2 x 10 bilateral 25# Hip flexor/ QL stretch on stair 2 x 20 sec    01/31/23 HEP established: see below  PATIENT EDUCATION:  Education details: Access Code: [email or web address] Person educated: Patient Education method: Explanation, Demonstration, and Handouts Education comprehension: verbalized understanding and returned demonstration  HOME EXERCISE PROGRAM: Access Code: [email or web address] URL: [email or web address] Date: 02/06/2023 Prepared by: Claude Manges  Exercises - Seated Figure 4 Piriformis Stretch  - 2-3 x daily - 7 x weekly - 3 sets - 10 reps - Clamshell  - 1 x daily - 7 x weekly - 2 sets - 10 reps - Sidelying Reverse Clamshell  - 1 x daily - 7 x weekly - 2 sets - 10 reps - Forward T with Counter Support  - 1 x daily - 7 x weekly - 1 sets - 10 reps - Standing Hip Flexion March  - 1 x daily - 7 x weekly - 2 sets - 10 reps - Supine Bridge  - 1 x daily - 7 x weekly - 2 sets - 10 reps - Half  Kneeling Hip Flexor Stretch with Sidebend  - 1 x daily - 7 x weekly - 3 sets - 10 reps - Side Stepping with Resistance at Thighs  - 1 x daily - 7 x weekly - 2 sets - Single Leg Deadlift with Kettlebell  - 1 x daily - 7 x weekly - 2 sets - 10 reps - Runner's Step Up/Down  - 1 x daily - 7 x weekly - 2 sets - 10 reps  ASSESSMENT:  CLINICAL IMPRESSION: Today's treatment session focused on hip stability. Educated patient to pace herself when doing exercises and modifying her exercise intensity. Incorporated single leg deadlifts and patient required verbal and visual cues for correct exercise performance. Updated patient's HEP to include hip strengthening progressions. Patient is progressing appropriately with physical therapy. Patient will benefit from skilled PT to address the below impairments and improve overall function.    OBJECTIVE IMPAIRMENTS: decreased ROM, decreased strength, increased fascial restrictions, increased muscle spasms, impaired flexibility, and pain.   ACTIVITY LIMITATIONS: sitting  PARTICIPATION LIMITATIONS: driving  PERSONAL FACTORS: Age and 1-2 comorbidities: IBS, Lt hip labral tear and hamstring tear  are also affecting patient's functional outcome.   REHAB POTENTIAL: Good  CLINICAL DECISION MAKING: Stable/uncomplicated  EVALUATION COMPLEXITY: Low   GOALS: Goals reviewed with patient? Yes  SHORT TERM GOALS: Target date: 02/28/2023    Be independent in initial HEP Baseline: Goal status: INITIAL  2.  Report > or = to 30% reduction in Lt hip and gluteal pain with sitting  Baseline: 2-5/10 Goal status: INITIAL    LONG TERM GOALS: Target date: 03/28/2023    Be independent in advanced HEP Baseline:  Goal status: INITIAL  2.  Report > or = to 60% reduction in Lt hip and gluteal pain with sitting  Baseline:  Goal status: INITIAL  3.  Improve LEFS to > or = to 63/64 to improve function  Baseline:  Goal status: INITIAL  4.  Verbalie and demonstrate  self-care strategies to manage pain including tissue mobility practices and change of position Baseline:  Goal status: INITIAL    PLAN:  PT FREQUENCY: 2x/week  PT DURATION: 8 weeks  PLANNED INTERVENTIONS: 97110-Therapeutic exercises, 97530- Therapeutic activity, 97112- Neuromuscular re-education, 97535- Self Care, 54098- Manual therapy, [id number]- Gait training, [phone]- Canalith repositioning, [id number]- Aquatic Therapy, [id number]- Electrical stimulation (unattended), [id number]- Electrical stimulation (manual), [id number]- Traction (mechanical), Patient/Family education, Balance training, Stair training, Taping, Dry Needling, Joint mobilization, Joint manipulation, Spinal manipulation, Vestibular training, Cryotherapy, and Moist heat.  PLAN FOR NEXT SESSION: assess updated HEP; hip abductor & ER strengthening;  DN for Lt hamstring    Claude Manges, PT 02/06/23 9:39 AM [facility] [address], [address] [place], [place] [address] Phone # [phone] Fax [phone]

## 2023-02-06 NOTE — Therapy (Signed)
OUTPATIENT PHYSICAL THERAPY FEMALE PELVIC TREATMENT   Patient Name: Jessica Miles MRN: [id number] DOB:1945/04/24, 78 y.o., female Today's Date: 02/06/2023  END OF SESSION:  PT End of Session - 02/06/23 1019     Visit Number 8    Date for PT Re-Evaluation 03/28/23    Authorization Type Medicare - needs Kx at 15    Progress Note Due on Visit 10    PT Start Time 1015    PT Stop Time 1055    PT Time Calculation (min) 40 min    Activity Tolerance Patient tolerated treatment well    Behavior During Therapy [facility] for tasks assessed/performed             Past Medical History:  Diagnosis Date   Allergy    Anxiety    COVID-19 virus infection    09-23-2019- 9-16 had monoclonal infusion    Depression    History of IBS    HPV (human papilloma virus) infection    Hyperlipidemia    82- not taking statin    Loss of taste    Neuropathy 2015   surgery for Mortons neuroma in 1985   Osteopenia    Osteoporosis    Raynaud's disease 02/2020   Tachycardia    Thyroid disease    Past Surgical History:  Procedure Laterality Date   BIOPSY  01/25/2022   Procedure: BIOPSY;  Surgeon: Lemar Lofty., MD;  Location: [facility];  Service: Gastroenterology;;   COLONOSCOPY  2019   hems only    ESOPHAGOGASTRODUODENOSCOPY (EGD) WITH PROPOFOL N/A 01/25/2022   Procedure: ESOPHAGOGASTRODUODENOSCOPY (EGD) WITH PROPOFOL;  Surgeon: Lemar Lofty., MD;  Location: [facility];  Service: Gastroenterology;  Laterality: N/A;   EUS N/A 01/25/2022   Procedure: UPPER ENDOSCOPIC ULTRASOUND (EUS) LINEAR;  Surgeon: Lemar Lofty., MD;  Location: [facility];  Service: Gastroenterology;  Laterality: N/A;   FOOT NEUROMA SURGERY  1985   WISDOM TOOTH EXTRACTION     Patient Active Problem List   Diagnosis Date Noted   Right upper quadrant abdominal mass 01/26/2023   Panic attacks 10/26/2022   Allergic reaction to insect bite 06/23/2022   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   Severely underweight  adult 09/07/2021   Acute diarrhea 04/27/2021   Inflamed sebaceous cyst 02/11/2021   Transient disorientation 10/20/2020   Memory change 08/05/2020   Family history of early CAD 02/27/2020   Tremor 02/27/2020   Aortic atherosclerosis (HCC) 12/19/2019   Urinary urgency 05/21/2019   Bladder irritation 05/21/2019   External hemorrhoid 05/21/2019   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   Raynaud's syndrome 02/09/2019   Osteoarthritis of thumb, 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   Fatigue 12/19/2017   Acute left-sided low back pain without sciatica 12/19/2017   SVT (supraventricular tachycardia) (HCC) 08/31/2017   Peripheral neuropathy 08/31/2017   Dry eyes 09/21/2015   Atopic dermatitis 03/13/2015   IBS (irritable bowel syndrome) 03/18/2014   Osteoporosis 03/18/2014   Major depressive disorder, recurrent episode, moderate (HCC) 03/18/2014   Hypothyroidism 12/14/2013    PCP: Excell Seltzer, MD  REFERRING PROVIDER: Sherre Scarlet, MD  REFERRING DIAG: M79.18 (ICD-10-CM) - Myalgia, other site G58.8 (ICD-10-CM) - Other specified mononeuropathies  THERAPY DIAG:  Other muscle spasm  Pain in left hip  Muscle weakness (generalized)  Other low back pain  Pelvic pain  Abnormal posture  Unspecified lack of coordination  Rationale for Evaluation and Treatment: Rehabilitation  ONSET DATE: 3 years   SUBJECTIVE:  SUBJECTIVE STATEMENT: Pt states that she was sore after last session. She was in a lot of pain over the weekend, but did state that she did over an hour of exercise each day. She is wondering if this was too much.   PAIN:  Are you having pain? Yes NPRS scale: 2.5/10 Pain location:  Lt hip and perineum/vulva  Pain type: soreness Pain description: intermittent   Aggravating factors: sitting Relieving  factors: lying flat, sitting on toilet  PRECAUTIONS: Other: large mass in Rt lower quadrant that has not been treated by medical team  RED FLAGS: None   WEIGHT BEARING RESTRICTIONS: No  FALLS:  Has patient fallen in last 6 months? No  LIVING ENVIRONMENT: Lives with: lives with their family Lives in: House/apartment  OCCUPATION: retired Runner, broadcasting/film/video  PLOF: Independent  PATIENT GOALS: decrease pain  PERTINENT HISTORY:  Interstitial cystitis  BOWEL MOVEMENT: Pain with bowel movement: No Type of bowel movement:Frequency 1x/day and Strain No Fully empty rectum: No Leakage: No - none currently Pads: No Fiber supplement: No  URINATION: Pain with urination: No Fully empty bladder: Yes: - Stream: Strong Urgency: Yes: have to run  Frequency: regular Leakage:  none Pads: No  INTERCOURSE: NA  PREGNANCY: Vaginal deliveries 2 Tearing No C-section deliveries 0 Currently pregnant No  PROLAPSE: None   OBJECTIVE:  Note: Objective measures were completed at Evaluation unless otherwise noted. 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, swelling of quadratus femoris muscle, tendinopathic and frayed hamstring origin Scheduled for anal manometry in April 2025   COGNITION: Overall cognitive status: Within functional limits for tasks assessed     SENSATION: Light touch:  Appears intact Proprioception: Appears intact   FUNCTIONAL/SPECIAL TESTS:  Single leg stance:  Rt: more stable, some Lt drop - compensated trendelenburg  Lt: less stable, more Rt drop - compensated trendelenburg Squat: Lt weight shift, Rt valgus knee collapse FAIR: (+) Lt hip Hamstring length: significant decrease with pain on Lt, -75 degrees Curl-up test: breath holding, but no distortion, 2 finger width diastasis in upper abdominals Bed mobility: breath holding  Palpable transversus abdominus contraction bil, but weak Tenderness at Lt ischial tuberosity and hamstrings/Lt posterolateral hip mm  GAIT: Comments: WNL  POSTURE: forward head, decreased lumbar lordosis, posterior pelvic tilt, and Lt lower thoracic/upper lumbar curvature, elevated Lt iliac crest  PELVIC ALIGNMENT: Lt posterior rotation, Lt sacral facing rotation   LUMBARAROM/PROM:  A/PROM A/PROM  Eval (% available)  Flexion 75, pain on LT  Extension 75  Right lateral flexion 100  Left lateral flexion 100, pain in back and Lt pelvis  Right rotation 75  Left rotation 50, feels good on back   (Blank rows =  not tested)   LOWER EXTREMITY MMT:  MMT Right eval Left eval  Hip abduction 5/5 5/5  Hip adduction    Hip internal rotation 3/5 4-/5  Hip external rotation 3/5 4/5   PALPATION:   General: Large mass located in Rt abdomen lateral to umbilicus, non-tender, but with pressure it caused discomfort in low back - patient had not noticed before and reports no doctor has mentioned to her before    TODAY'S TREATMENT:                                                                                                                              DATE:  02/06/23 Manual: Pt provides verbal consent for internal vaginal/rectal pelvic floor exam. Internal pelvic floor muscle release to Lt deep pelvic floor muscles Trigger point release to Lt deep hip rotators externally Neuromuscular re-education: Transversus abdominus  training with multimodal cues for improved motor control and breath coordination  Transversus abdominus isometrics 10x Supine unilateral clam shell red band 10x bil   02/02/23 Manual: Pt provides verbal consent for internal vaginal/rectal pelvic floor exam. Internal vaginal pelvic floor muscle exam Vaginal pelvic floor muscle release to deep Lt layers Trigger Point Dry Needling  Initial Treatment: Pt instructed on Dry Needling rational, procedures, and possible side effects. Pt instructed to expect mild to moderate muscle soreness later in the day and/or into the next day.  Pt instructed in methods to reduce muscle soreness. Pt instructed to continue prescribed HEP. Patient was educated on signs and symptoms of infection and other risk factors and advised to seek medical attention should they occur.  Patient verbalized understanding of these instructions and education.   Patient Verbal Consent Given: Yes Education Handout Provided: Yes Muscles Treated: Lt glute med, proximal adductors, hip flexor, pectineus, and disotal iliopsoas insertion Electrical Stimulation Performed: No Treatment Response/Outcome: twitch response and release Soft tissue mobilization to Lt glutes Lt side lying external obturator internus release  Soft tissue mobilization to Lt adductors/hip flexors   01/31/23 Neuromuscular re-education: Diaphragmatic breathing with multimodal cues for improved 360 expansion/lengthening Cat cow 2 x 10, 2 x 10 with hip internal rotation Quadruped rocking with hip internal rotation 2 x 10 Child's pose with hip internal rotation 20 breaths Happy baby 10 breaths Butterfly 10 breaths Exercises: Wide leg lower trunk rotation to emphasize hip internal rotation 2 x 10 Supine piriformis stretch 60 sec bil  PATIENT EDUCATION:  Education details: See above Person educated: Patient Education method: Explanation, Demonstration, Tactile cues, Verbal cues, and Handouts Education  comprehension: verbalized understanding  HOME EXERCISE PROGRAM: [email or web address]  ASSESSMENT:  CLINICAL IMPRESSION: Discussed HEP this session and encouraged her to split exercises up so she is not performing them for such long periods of time; this is possibly increasing her pain since she had higher levels over the weekend. She was also encouraged to observe soreness not just during her biking, but the several hours after. It would be surprising that  biking was not contributing to pain at all given location. We performed core training today and discussed how to incorporate into other PT exercises for improved pelvis support with appropriate core activation. We returned to pelvic floor muscle release internally and then external release to external sacral border and deep hip rotators with excellent response and significant improvement in pain, indicating large contribution to pain that she experiences being pelvic floor muscles. We will continue to work on this through future treatment sessions. She will continue to benefit from skilled PT intervention in order to decrease pelvic pain, improve low back/Lt hip pain,decrease urinary urgency, and begin/progress functional strengthening program.   OBJECTIVE IMPAIRMENTS: decreased activity tolerance, decreased coordination, decreased endurance, decreased mobility, decreased strength, increased fascial restrictions, increased muscle spasms, impaired tone, postural dysfunction, and pain.   ACTIVITY LIMITATIONS: lifting, bending, sitting, squatting, and bed mobility  PARTICIPATION LIMITATIONS: cleaning, driving, and community activity  PERSONAL FACTORS: 1 comorbidity: medical history  are also affecting patient's functional outcome.   REHAB POTENTIAL: Good  CLINICAL DECISION MAKING: Evolving/moderate complexity  EVALUATION COMPLEXITY: Moderate   GOALS: Goals reviewed with patient? Yes  SHORT TERM GOALS: Target date: 02/21/23  Pt will be independent  with HEP.   Baseline: Goal status: INITIAL  2.  Pt will be able to teach back and utilize urge suppression technique in order to help reduce number of trips to the bathroom.    Baseline:  Goal status: INITIAL  3.  Pt will begin use of pelvic floor muscle wand on regular basis (2-3x/week) in order to help reduce pelvic floor muscle tension and pain. Baseline:  Goal status: INITIAL  4.  Pt will be independent with diaphragmatic breathing and down training activities in order to improve pelvic floor relaxation.  Baseline:  Goal status: INITIAL  5.  Pt will report pelvic pain no greater than 4/10. Baseline:  Goal status: INITIAL   LONG TERM GOALS: Target date: 03/21/23  Pt will be independent with advanced HEP.   Baseline:  Goal status: INITIAL  2.  Pt will be able to go 2-3 hours in between voids without urgency or incontinence in order to improve QOL and perform all functional activities with less difficulty.   Baseline:  Goal status: INITIAL  3.  Pt will report pelvic pain no greater than 2/10.  Baseline:  Goal status: INITIAL  4.  Pt will be able to sit for >30 minutes without any increased pelvic pain.  Baseline:  Goal status: INITIAL  5.  Pt will be able to stand on single leg without pelvic drop and squat without weight shift in order to demonstrate improved functional core and hip strength to more appropriately stabilize pelvis.  Baseline:  Goal status: INITIAL  6.  Pt will demonstrate increase in all impaired hip strength by 1 muscle grades in order to demonstrate improved lumbopelvic support and increase functional ability.   Baseline:  Goal status: INITIAL  PLAN:  PT FREQUENCY: 1-2x/week  PT DURATION: 8 weeks  PLANNED INTERVENTIONS: 97110-Therapeutic exercises, 97530- Therapeutic activity, 97112- Neuromuscular re-education, 97535- Self Care, 56213- Manual therapy, Dry Needling, and Biofeedback  PLAN FOR NEXT SESSION: test hamstring strength, internal  pelvic floor muscle exam and treatment to tolerance   Julio Alm, PT, DPT01/27/2511:25 AM

## 2023-02-08 ENCOUNTER — Encounter: Payer: Self-pay | Admitting: Physical Therapy

## 2023-02-08 ENCOUNTER — Telehealth: Payer: Self-pay | Admitting: Family Medicine

## 2023-02-08 ENCOUNTER — Ambulatory Visit: Payer: Medicare Other | Admitting: Physical Therapy

## 2023-02-08 ENCOUNTER — Ambulatory Visit: Payer: Medicare Other

## 2023-02-08 DIAGNOSIS — M6281 Muscle weakness (generalized): Secondary | ICD-10-CM

## 2023-02-08 DIAGNOSIS — M62838 Other muscle spasm: Secondary | ICD-10-CM

## 2023-02-08 DIAGNOSIS — M5416 Radiculopathy, lumbar region: Secondary | ICD-10-CM | POA: Diagnosis not present

## 2023-02-08 DIAGNOSIS — M5459 Other low back pain: Secondary | ICD-10-CM

## 2023-02-08 DIAGNOSIS — M25552 Pain in left hip: Secondary | ICD-10-CM

## 2023-02-08 NOTE — Telephone Encounter (Signed)
Fax received.  Per nephrology office pt does not meet criteria of office.

## 2023-02-08 NOTE — Therapy (Signed)
OUTPATIENT PHYSICAL THERAPY THORACOLUMBAR TREATMENT   Patient Name: Jessica Miles MRN: [id number] DOB:1945-03-01, 78 y.o., female Today's Date: 02/08/2023  END OF SESSION:  PT End of Session - 02/08/23 1025     Visit Number 9    Date for PT Re-Evaluation 03/28/23    Authorization Type Medicare - needs Kx at 15    Progress Note Due on Visit 10    PT Start Time 0930    PT Stop Time 1016    PT Time Calculation (min) 46 min    Activity Tolerance Patient tolerated treatment well    Behavior During Therapy [facility] for tasks assessed/performed                Past Medical History:  Diagnosis Date   Allergy    Anxiety    COVID-19 virus infection    09-23-2019- 9-16 had monoclonal infusion    Depression    History of IBS    HPV (human papilloma virus) infection    Hyperlipidemia    82- not taking statin    Loss of taste    Neuropathy 2015   surgery for Mortons neuroma in 1985   Osteopenia    Osteoporosis    Raynaud's disease 02/2020   Tachycardia    Thyroid disease    Past Surgical History:  Procedure Laterality Date   BIOPSY  01/25/2022   Procedure: BIOPSY;  Surgeon: Lemar Lofty., MD;  Location: [facility];  Service: Gastroenterology;;   COLONOSCOPY  2019   hems only    ESOPHAGOGASTRODUODENOSCOPY (EGD) WITH PROPOFOL N/A 01/25/2022   Procedure: ESOPHAGOGASTRODUODENOSCOPY (EGD) WITH PROPOFOL;  Surgeon: Lemar Lofty., MD;  Location: [facility];  Service: Gastroenterology;  Laterality: N/A;   EUS N/A 01/25/2022   Procedure: UPPER ENDOSCOPIC ULTRASOUND (EUS) LINEAR;  Surgeon: Lemar Lofty., MD;  Location: [facility];  Service: Gastroenterology;  Laterality: N/A;   FOOT NEUROMA SURGERY  1985   WISDOM TOOTH EXTRACTION     Patient Active Problem List   Diagnosis Date Noted   Right upper quadrant abdominal mass 01/26/2023   Panic attacks 10/26/2022   Allergic reaction to insect bite 06/23/2022   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   Severely  underweight adult 09/07/2021   Acute diarrhea 04/27/2021   Inflamed sebaceous cyst 02/11/2021   Transient disorientation 10/20/2020   Memory change 08/05/2020   Family history of early CAD 02/27/2020   Tremor 02/27/2020   Aortic atherosclerosis (HCC) 12/19/2019   Urinary urgency 05/21/2019   Bladder irritation 05/21/2019   External hemorrhoid 05/21/2019   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   Raynaud's syndrome 02/09/2019   Osteoarthritis of thumb, 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   Fatigue 12/19/2017   Acute left-sided low back pain without sciatica 12/19/2017   SVT (supraventricular tachycardia) (HCC) 08/31/2017   Peripheral neuropathy 08/31/2017   Dry eyes 09/21/2015   Atopic dermatitis 03/13/2015   IBS (irritable bowel syndrome) 03/18/2014   Osteoporosis 03/18/2014   Major depressive disorder, recurrent episode, moderate (HCC) 03/18/2014   Hypothyroidism 12/14/2013    PCP: Kerby Nora, MD  REFERRING PROVIDER: Juanell Fairly, MD  REFERRING DIAG: Lumbar radiculopathy, strain of muscle, fascia and tendon of the posterior muscle group of the thigh level   Rationale for Evaluation and Treatment: Rehabilitation  THERAPY DIAG:  Other muscle spasm  Pain in left hip  Muscle weakness (generalized)  Other low back pain  ONSET DATE: 2 months ago (flare up) after chronic/intermittent   SUBJECTIVE:  SUBJECTIVE STATEMENT: Patient reports her hip felt good after last session. She got a foam roller but she feels she is not doing it correctly.   From Eval: Pt presents to PT with Lt LE pain that began December 2024.  She has had this pain before and had PT ~1.5 years ago.  Imaging shows labral tear of the left hip, ischiofemoral impingement, tendiopathic and frayed hamstring origin.  She is active in pelvic PT for  pudendal nerve entrapment.   PERTINENT HISTORY:  Depression, osteopenia/porosis,Raynaud's, Lt hip labral tear and frayed hamstring origin, IBS  PAIN: 02/08/23 Are you having pain? Yes: NPRS scale: 0/10  Pain location: Lt hamstring insertion and gluteal  Pain description: aching, constant  Aggravating factors: sitting long periods, after exercise Relieving factors: exercising (during)  PRECAUTIONS: None  RED FLAGS: None   WEIGHT BEARING RESTRICTIONS: No  FALLS:  Has patient fallen in last 6 months? No  LIVING ENVIRONMENT: Lives with: lives with their spouse Lives in: House/apartment Stairs: No Has following equipment at home: None  OCCUPATION: retired Runner, broadcasting/film/video   PLOF: Independent and Leisure: exercises, walking/biking, reading   PATIENT GOALS: reduce Lt LE pain, sit without pain   NEXT MD VISIT: none scheduled   OBJECTIVE:  Note: Objective measures were completed at Evaluation unless otherwise noted.  DIAGNOSTIC FINDINGS:  10/2021 MRI Lt hip:  Mild Lt hip osteoarthropathy, labral tear of the left hip, ischiofemoral impingement, tendiopathic and frayed hamstring origin   PATIENT SURVEYS:  LEFS 60/64 (pt didn't answer questions 16-19 due to NA)  COGNITION: Overall cognitive status: Within functional limits for tasks assessed     SENSATION: WFL  MUSCLE LENGTH: Limited IR/ER bil hips by 25%.    POSTURE: rounded shoulders and forward head  PALPATION: Trigger points in Lt gluteals and reduced spinal mobility with PA Mobs in thoracic and lumbar spine  LUMBAR ROM:  Full lumbar A/ROM without pain  LOWER EXTREMITY ROM:      LOWER EXTREMITY MMT:    LOWER EXTREMITY MMT:   MMT Right eval Left eval  Hip abduction 5/5 5/5  Hip adduction      Hip internal rotation 3/5 4-/5  Hip external rotation 3/5 4/5     GAIT: Distance walked: 75 Assistive device utilized: None Level of assistance: Complete Independence Comments: mild scissoring bil Step-ups: Rt  and Lt with hip adduction bil  TREATMENT DATE:  02/08/2023 NuStep Level 4 - PT present to discuss status Form correction form rolling Lt hip and hamstring  Hip Matrix (extension & abduction) 2 x 10 bilateral 30# Leg Press seat 7 70# with blue loop around knees 2 x 10 bilateral ; 40# unilateral x 20 Monster walks (forwards & backwards) with yellow loop x 4 laps Single leg deadlifts 5# KB 2 x 10 bilateral     02/06/2023 NuStep Level 3 6 mins- PT present to discuss status Form correction for QL stretch Hip Matrix (flexion & abduction) 2 x 10 bilateral 25# Leg Press seat 7 65# with blue loop around knees 2 x 10 bilateral ; 35# unilateral x 20 6 in Step up + knee drive 5# KB 2 x 10 bilateral  Sit stepping with blue loop around knees x 4 Single leg deadlifts 5# KB 2 x 10 bilateral     02/02/2023 NuStep Level 5 6 mins- PT present to discuss status Seated figure 4 stretch 2 x 30sec  Clamshells 2 x 10 bilateral  Reverse Clamshell 2 x 10 bilateral  Forward T at counter 2  x 20 sec Foam folling for glutes & hamstring x 2 mins Bridging 2 x 10 Squat taps to chair 8# DB hold 2 x 10 Hip flexion march holding 8# DB x 20 each direction Patient education on different foam rollers Hip Matrix (flexion & abduction) 2 x 10 bilateral 25# Hip flexor/ QL stretch on stair 2 x 20 sec                                                                                                                               PATIENT EDUCATION:  Education details: Access Code: [email or web address] Person educated: Patient Education method: Explanation, Demonstration, and Handouts Education comprehension: verbalized understanding and returned demonstration  HOME EXERCISE PROGRAM: Access Code: [email or web address] URL: [email or web address] Date: 02/06/2023 Prepared by: Claude Manges  Exercises - Seated Figure 4 Piriformis Stretch  - 2-3 x daily - 7 x weekly - 3 sets - 10 reps - Clamshell  - 1 x daily - 7 x weekly - 2  sets - 10 reps - Sidelying Reverse Clamshell  - 1 x daily - 7 x weekly - 2 sets - 10 reps - Forward T with Counter Support  - 1 x daily - 7 x weekly - 1 sets - 10 reps - Standing Hip Flexion March  - 1 x daily - 7 x weekly - 2 sets - 10 reps - Supine Bridge  - 1 x daily - 7 x weekly - 2 sets - 10 reps - Half Kneeling Hip Flexor Stretch with Sidebend  - 1 x daily - 7 x weekly - 3 sets - 10 reps - Side Stepping with Resistance at Thighs  - 1 x daily - 7 x weekly - 2 sets - Single Leg Deadlift with Kettlebell  - 1 x daily - 7 x weekly - 2 sets - 10 reps - Runner's Step Up/Down  - 1 x daily - 7 x weekly - 2 sets - 10 reps  ASSESSMENT:  CLINICAL IMPRESSION: Today's treatment session focused on hip stability. Valor presented to therapy today with no hip pain or discomfort. Educated patient on correct technique for foam roller. Patient tolerated progressions of exercises well. She required verbal and visual cues for correct form and execution. Overall, Tata is progressing appropriately with physical therapy. Patient will benefit from skilled PT to address the below impairments and improve overall function.   OBJECTIVE IMPAIRMENTS: decreased ROM, decreased strength, increased fascial restrictions, increased muscle spasms, impaired flexibility, and pain.   ACTIVITY LIMITATIONS: sitting  PARTICIPATION LIMITATIONS: driving  PERSONAL FACTORS: Age and 1-2 comorbidities: IBS, Lt hip labral tear and hamstring tear  are also affecting patient's functional outcome.   REHAB POTENTIAL: Good  CLINICAL DECISION MAKING: Stable/uncomplicated  EVALUATION COMPLEXITY: Low   GOALS: Goals reviewed with patient? Yes  SHORT TERM GOALS: Target date: 02/28/2023    Be independent in initial HEP Baseline: Goal status: INITIAL  2.  Report > or = to 30% reduction in  Lt hip and gluteal pain with sitting  Baseline: 2-5/10 Goal status: INITIAL    LONG TERM GOALS: Target date: 03/28/2023    Be independent in  advanced HEP Baseline:  Goal status: INITIAL  2.  Report > or = to 60% reduction in Lt hip and gluteal pain with sitting  Baseline:  Goal status: INITIAL  3.  Improve LEFS to > or = to 63/64 to improve function  Baseline:  Goal status: INITIAL  4.  Verbalie and demonstrate self-care strategies to manage pain including tissue mobility practices and change of position Baseline:  Goal status: INITIAL    PLAN:  PT FREQUENCY: 2x/week  PT DURATION: 8 weeks  PLANNED INTERVENTIONS: 97110-Therapeutic exercises, 97530- Therapeutic activity, 97112- Neuromuscular re-education, 97535- Self Care, 54098- Manual therapy, [id number]- Gait training, [phone]- Canalith repositioning, [id number]- Aquatic Therapy, [id number]- Electrical stimulation (unattended), [id number]- Electrical stimulation (manual), [id number]- Traction (mechanical), Patient/Family education, Balance training, Stair training, Taping, Dry Needling, Joint mobilization, Joint manipulation, Spinal manipulation, Vestibular training, Cryotherapy, and Moist heat.  PLAN FOR NEXT SESSION: clamshells & reverse clamshells with band; single leg hip abduction   Claude Manges, PT 02/08/23 10:26 AM [facility] [address] [place], [place] [address] Phone # [phone] Fax [phone]

## 2023-02-08 NOTE — Telephone Encounter (Signed)
Copied from CRM [id number]. Topic: Referral - Status >> Feb 08, 2023  3:33 PM Maxwell Marion wrote: Reason for CRM: Ebony from [facility] says the doctor reviewed the referral and feels the patient needs urology and not nephrology.

## 2023-02-09 ENCOUNTER — Other Ambulatory Visit: Payer: Medicare Other

## 2023-02-09 NOTE — Telephone Encounter (Signed)
I am sorry, urologist is the correct referral and I apologize I did not note is not what the office the patient gave [place] was for. She states she will discuss it with her urogynecologist and we will refer to a urologist if needed.  Please close this referral

## 2023-02-10 NOTE — Telephone Encounter (Signed)
Noted. Referral closed.

## 2023-02-14 ENCOUNTER — Ambulatory Visit: Payer: Medicare Other | Attending: Orthopedic Surgery | Admitting: Physical Therapy

## 2023-02-14 ENCOUNTER — Encounter: Payer: Self-pay | Admitting: Physical Therapy

## 2023-02-14 DIAGNOSIS — M5459 Other low back pain: Secondary | ICD-10-CM | POA: Diagnosis present

## 2023-02-14 DIAGNOSIS — M6281 Muscle weakness (generalized): Secondary | ICD-10-CM | POA: Diagnosis present

## 2023-02-14 DIAGNOSIS — M62838 Other muscle spasm: Secondary | ICD-10-CM | POA: Diagnosis present

## 2023-02-14 DIAGNOSIS — M25552 Pain in left hip: Secondary | ICD-10-CM | POA: Diagnosis present

## 2023-02-14 NOTE — Therapy (Signed)
 OUTPATIENT PHYSICAL THERAPY THORACOLUMBAR TREATMENT   Patient Name: Jessica Miles MRN: [id number] DOB:[birth year], 78 y.o., female Today's Date: 02/14/2023  Progress Note Reporting Period 01/31/2023 to 02/14/2023  See note below for Objective Data and Assessment of Progress/Goals.     END OF SESSION:  PT End of Session - 02/14/23 1108     Visit Number 10    Date for PT Re-Evaluation 03/28/23    Authorization Type Medicare - needs Kx at 15    Progress Note Due on Visit 20    PT Start Time 1009    PT Stop Time 1055    PT Time Calculation (min) 46 min    Activity Tolerance Patient tolerated treatment well    Behavior During Therapy [facility] for tasks assessed/performed                 Past Medical History:  Diagnosis Date   Allergy    Anxiety    COVID-19 virus infection    09-23-2019- 9-16 had monoclonal infusion    Depression    History of IBS    HPV (human papilloma virus) infection    Hyperlipidemia    82- not taking statin    Loss of taste    Neuropathy 2015   surgery for Mortons neuroma in 1985   Osteopenia    Osteoporosis    Raynaud's disease 02/2020   Tachycardia    Thyroid  disease    Past Surgical History:  Procedure Laterality Date   BIOPSY  01/25/2022   Procedure: BIOPSY;  Surgeon: Wilhelmenia Aloha Raddle., MD;  Location: [facility];  Service: Gastroenterology;;   COLONOSCOPY  2019   hems only    ESOPHAGOGASTRODUODENOSCOPY (EGD) WITH PROPOFOL  N/A 01/25/2022   Procedure: ESOPHAGOGASTRODUODENOSCOPY (EGD) WITH PROPOFOL ;  Surgeon: Wilhelmenia Aloha Raddle., MD;  Location: [facility];  Service: Gastroenterology;  Laterality: N/A;   EUS N/A 01/25/2022   Procedure: UPPER ENDOSCOPIC ULTRASOUND (EUS) LINEAR;  Surgeon: Wilhelmenia Aloha Raddle., MD;  Location: [facility];  Service: Gastroenterology;  Laterality: N/A;   FOOT NEUROMA SURGERY  1985   WISDOM TOOTH EXTRACTION     Patient Active Problem List   Diagnosis Date Noted   Right upper quadrant abdominal mass  01/26/2023   Panic attacks 10/26/2022   Allergic reaction to insect bite 06/23/2022   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   Severely underweight adult 09/07/2021   Acute diarrhea 04/27/2021   Inflamed sebaceous cyst 02/11/2021   Transient disorientation 10/20/2020   Memory change 08/05/2020   Family history of early CAD 02/27/2020   Tremor 02/27/2020   Aortic atherosclerosis (HCC) 12/19/2019   Urinary urgency 05/21/2019   Bladder irritation 05/21/2019   External hemorrhoid 05/21/2019   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   Raynaud's syndrome 02/09/2019   Osteoarthritis of thumb, 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   Fatigue 12/19/2017   Acute left-sided low back pain without sciatica 12/19/2017   SVT (supraventricular tachycardia) (HCC) 08/31/2017   Peripheral neuropathy 08/31/2017   Dry eyes 09/21/2015   Atopic dermatitis 03/13/2015   IBS (irritable bowel syndrome) 03/18/2014   Osteoporosis 03/18/2014   Major depressive disorder, recurrent episode, moderate (HCC) 03/18/2014   Hypothyroidism 12/14/2013    PCP: Avelina No, MD  REFERRING PROVIDER: Marchia Drivers, MD  REFERRING DIAG: Lumbar radiculopathy, strain of muscle, fascia and tendon of the posterior muscle group of the thigh level   Rationale for Evaluation and Treatment: Rehabilitation  THERAPY DIAG:  Other muscle spasm  Pain in left hip  Muscle weakness (generalized)  Other low back pain  ONSET DATE:  2 months ago (flare up) after chronic/intermittent   SUBJECTIVE:                                                                                                                                                                                           SUBJECTIVE STATEMENT: Patient reports her hip felt good after last session. She got a foam roller but she feels she is not doing it correctly.   From Eval: Pt presents to PT with Lt LE pain that began December 2024.  She has had this pain before and had PT ~1.5 years ago.  Imaging shows labral  tear of the left hip, ischiofemoral impingement, tendiopathic and frayed hamstring origin.  She is active in pelvic PT for pudendal nerve entrapment.   PERTINENT HISTORY:  Depression, osteopenia/porosis,Raynaud's, Lt hip labral tear and frayed hamstring origin, IBS  PAIN: 02/08/23 Are you having pain? Yes: NPRS scale: 0/10  Pain location: Lt hamstring insertion and gluteal  Pain description: aching, constant  Aggravating factors: sitting long periods, after exercise Relieving factors: exercising (during)  PRECAUTIONS: None  RED FLAGS: None   WEIGHT BEARING RESTRICTIONS: No  FALLS:  Has patient fallen in last 6 months? No  LIVING ENVIRONMENT: Lives with: lives with their spouse Lives in: House/apartment Stairs: No Has following equipment at home: None  OCCUPATION: retired runner, broadcasting/film/video   PLOF: Independent and Leisure: exercises, walking/biking, reading   PATIENT GOALS: reduce Lt LE pain, sit without pain   NEXT MD VISIT: none scheduled   OBJECTIVE:  Note: Objective measures were completed at Evaluation unless otherwise noted.  DIAGNOSTIC FINDINGS:  10/2021 MRI Lt hip:  Mild Lt hip osteoarthropathy, labral tear of the left hip, ischiofemoral impingement, tendiopathic and frayed hamstring origin  02/13/2023 Xray lumbar spine: lumbar degenerative curve to the Lt. Significant disc space narrowing on the convex side of curvature L2-L4;L4-5;L5-SI. Significant degenerative disc disease at L3.     PATIENT SURVEYS:  LEFS 60/64 (pt didn't answer questions 16-19 due to NA)  COGNITION: Overall cognitive status: Within functional limits for tasks assessed     SENSATION: WFL  MUSCLE LENGTH: Limited IR/ER bil hips by 25%.    POSTURE: rounded shoulders and forward head  PALPATION: Trigger points in Lt gluteals and reduced spinal mobility with PA Mobs in thoracic and lumbar spine  LUMBAR ROM:  Full lumbar A/ROM without pain  LOWER EXTREMITY ROM:      LOWER EXTREMITY MMT:     LOWER EXTREMITY MMT:   MMT Right eval Left eval  Hip abduction 5/5 5/5  Hip adduction      Hip internal rotation 3/5 4-/5  Hip external  rotation 3/5 4/5     GAIT: Distance walked: 75 Assistive device utilized: None Level of assistance: Complete Independence Comments: mild scissoring bil Step-ups: Rt and Lt with hip adduction bil  TREATMENT DATE:  02/14/2023 NuStep Level 4 - PT present to discuss status Hip Matrix (abduction & hip flexion) 2 x 10 bilateral 30# Leg Press seat 7 70#  2 x 10 bilateral ; 50# unilateral x 20 Monster walks (forwards & backwards) with yellow loop x 4 laps Single leg deadlifts 5# KB 2 x 10 bilateral Standing hamstring curl 2 x 10 bilateral  6 in step step ups + knee drive holding 5# KB x 10 each leg 6in lateral step ups x 10 each leg   02/08/2023 NuStep Level 4 - PT present to discuss status Form correction form rolling Lt hip and hamstring  Hip Matrix (extension & abduction) 2 x 10 bilateral 30# Leg Press seat 7 70# with blue loop around knees 2 x 10 bilateral ; 40# unilateral x 20 Monster walks (forwards & backwards) with yellow loop x 4 laps Single leg deadlifts 5# KB 2 x 10 bilateral     02/06/2023 NuStep Level 3 6 mins- PT present to discuss status Form correction for QL stretch Hip Matrix (flexion & abduction) 2 x 10 bilateral 25# Leg Press seat 7 65# with blue loop around knees 2 x 10 bilateral ; 35# unilateral x 20 6 in Step up + knee drive 5# KB 2 x 10 bilateral  Sit stepping with blue loop around knees x 4 Single leg deadlifts 5# KB 2 x 10 bilateral     02/02/2023 NuStep Level 5 6 mins- PT present to discuss status Seated figure 4 stretch 2 x 30sec  Clamshells 2 x 10 bilateral  Reverse Clamshell 2 x 10 bilateral  Forward T at counter 2 x 20 sec Foam folling for glutes & hamstring x 2 mins Bridging 2 x 10 Squat taps to chair 8# DB hold 2 x 10 Hip flexion march holding 8# DB x 20 each direction Patient education  on different foam rollers Hip Matrix (flexion & abduction) 2 x 10 bilateral 25# Hip flexor/ QL stretch on stair 2 x 20 sec                                                                                                                               PATIENT EDUCATION:  Education details: Access Code: [email or web address] Person educated: Patient Education method: Explanation, Demonstration, and Handouts Education comprehension: verbalized understanding and returned demonstration  HOME EXERCISE PROGRAM: Access Code: [email or web address] URL: [email or web address] Date: 02/06/2023 Prepared by: Kristeen Sar  Exercises - Seated Figure 4 Piriformis Stretch  - 2-3 x daily - 7 x weekly - 3 sets - 10 reps - Clamshell  - 1 x daily - 7 x weekly - 2 sets - 10 reps - Sidelying Reverse Clamshell  - 1 x daily - 7 x weekly - 2 sets -  10 reps - Forward T with Counter Support  - 1 x daily - 7 x weekly - 1 sets - 10 reps - Standing Hip Flexion March  - 1 x daily - 7 x weekly - 2 sets - 10 reps - Supine Bridge  - 1 x daily - 7 x weekly - 2 sets - 10 reps - Half Kneeling Hip Flexor Stretch with Sidebend  - 1 x daily - 7 x weekly - 3 sets - 10 reps - Side Stepping with Resistance at Thighs  - 1 x daily - 7 x weekly - 2 sets - Single Leg Deadlift with Kettlebell  - 1 x daily - 7 x weekly - 2 sets - 10 reps - Runner's Step Up/Down  - 1 x daily - 7 x weekly - 2 sets - 10 reps  ASSESSMENT:  CLINICAL IMPRESSION: Today's treatment session focused on hip stability and strengthening. Jessica Miles has been experiencing increased hip pain over the weekend due to traveling and doctor's appointments. She had an Xray done on her lumbar spine (see results above) , and she is scheduled to have an MRI on her hip on Friday. Her MD is suspecting that se has a proximal hamstring tear. She tolerated treatment session well and did not verbalize any increased pain or discomfort. Patient required verbal and visual cues for correct exercise  performance. Patient will benefit from skilled PT to address the below impairments and improve overall function.    OBJECTIVE IMPAIRMENTS: decreased ROM, decreased strength, increased fascial restrictions, increased muscle spasms, impaired flexibility, and pain.   ACTIVITY LIMITATIONS: sitting  PARTICIPATION LIMITATIONS: driving  PERSONAL FACTORS: Age and 1-2 comorbidities: IBS, Lt hip labral tear and hamstring tear  are also affecting patient's functional outcome.   REHAB POTENTIAL: Good  CLINICAL DECISION MAKING: Stable/uncomplicated  EVALUATION COMPLEXITY: Low   GOALS: Goals reviewed with patient? Yes  SHORT TERM GOALS: Target date: 02/28/2023    Be independent in initial HEP Baseline: Goal status: MET 02/14/2023  2.  Report > or = to 30% reduction in Lt hip and gluteal pain with sitting  Baseline: 2-5/10 Goal status: IN PROGRESS 02/14/2023    LONG TERM GOALS: Target date: 03/28/2023    Be independent in advanced HEP Baseline:  Goal status: IN PROGRESS 02/14/2023  2.  Report > or = to 60% reduction in Lt hip and gluteal pain with sitting  Baseline:  Goal status: IN PROGRESS 02/14/2023  3.  Improve LEFS to > or = to 63/64 to improve function  Baseline:  Goal status: IN PROGRESS 02/14/2023  4.  Verbalie and demonstrate self-care strategies to manage pain including tissue mobility practices and change of position Baseline:  Goal status: IN PROGRESS 02/14/2023    PLAN:  PT FREQUENCY: 2x/week  PT DURATION: 8 weeks  PLANNED INTERVENTIONS: 97110-Therapeutic exercises, 97530- Therapeutic activity, 97112- Neuromuscular re-education, 97535- Self Care, [id number]- Manual therapy, [id number]- Gait training, [phone]- Canalith repositioning, [id number]- Aquatic Therapy, 97014- Electrical stimulation (unattended), [id number]- Electrical stimulation (manual), [id number]- Traction (mechanical), Patient/Family education, Balance training, Stair training, Taping, Dry Needling, Joint mobilization, Joint  manipulation, Spinal manipulation, Vestibular training, Cryotherapy, and Moist heat.  PLAN FOR NEXT SESSION: assess tolerance to treatment session; clamshells & reverse clamshells with band  Kristeen Sar, PT 02/14/23 11:10 AM [facility] [address] [place], [place] [address] Phone # [phone] Fax [phone]

## 2023-02-16 ENCOUNTER — Encounter: Payer: Self-pay | Admitting: Physical Therapy

## 2023-02-16 ENCOUNTER — Ambulatory Visit: Payer: Self-pay

## 2023-02-16 ENCOUNTER — Ambulatory Visit: Payer: Medicare Other | Admitting: Physical Therapy

## 2023-02-16 DIAGNOSIS — M62838 Other muscle spasm: Secondary | ICD-10-CM

## 2023-02-16 DIAGNOSIS — M25552 Pain in left hip: Secondary | ICD-10-CM

## 2023-02-16 DIAGNOSIS — M6281 Muscle weakness (generalized): Secondary | ICD-10-CM

## 2023-02-16 DIAGNOSIS — M5459 Other low back pain: Secondary | ICD-10-CM

## 2023-02-16 NOTE — Therapy (Signed)
 OUTPATIENT PHYSICAL THERAPY THORACOLUMBAR TREATMENT   Patient Name: Jessica Miles MRN: [id number] DOB:1945/03/15, 78 y.o., female Today's Date: 02/16/2023   END OF SESSION:  PT End of Session - 02/16/23 1153     Visit Number 11    Date for PT Re-Evaluation 03/28/23    Authorization Type Medicare - needs Kx at 15    Progress Note Due on Visit 20    PT Start Time 1100    PT Stop Time 1142    PT Time Calculation (min) 42 min    Activity Tolerance Patient tolerated treatment well    Behavior During Therapy [facility] for tasks assessed/performed                  Past Medical History:  Diagnosis Date   Allergy    Anxiety    COVID-19 virus infection    09-23-2019- 9-16 had monoclonal infusion    Depression    History of IBS    HPV (human papilloma virus) infection    Hyperlipidemia    83- not taking statin    Loss of taste    Neuropathy 2015   surgery for Mortons neuroma in 1985   Osteopenia    Osteoporosis    Raynaud's disease 02/2020   Tachycardia    Thyroid  disease    Past Surgical History:  Procedure Laterality Date   BIOPSY  01/25/2022   Procedure: BIOPSY;  Surgeon: Wilhelmenia Aloha Raddle., MD;  Location: [facility];  Service: Gastroenterology;;   COLONOSCOPY  2019   hems only    ESOPHAGOGASTRODUODENOSCOPY (EGD) WITH PROPOFOL  N/A 01/25/2022   Procedure: ESOPHAGOGASTRODUODENOSCOPY (EGD) WITH PROPOFOL ;  Surgeon: Wilhelmenia Aloha Raddle., MD;  Location: [facility];  Service: Gastroenterology;  Laterality: N/A;   EUS N/A 01/25/2022   Procedure: UPPER ENDOSCOPIC ULTRASOUND (EUS) LINEAR;  Surgeon: Wilhelmenia Aloha Raddle., MD;  Location: [facility];  Service: Gastroenterology;  Laterality: N/A;   FOOT NEUROMA SURGERY  1985   WISDOM TOOTH EXTRACTION     Patient Active Problem List   Diagnosis Date Noted   Right upper quadrant abdominal mass 01/26/2023   Panic attacks 10/26/2022   Allergic reaction to insect bite 06/23/2022   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   Severely  underweight adult 09/07/2021   Acute diarrhea 04/27/2021   Inflamed sebaceous cyst 02/11/2021   Transient disorientation 10/20/2020   Memory change 08/05/2020   Family history of early CAD 02/27/2020   Tremor 02/27/2020   Aortic atherosclerosis (HCC) 12/19/2019   Urinary urgency 05/21/2019   Bladder irritation 05/21/2019   External hemorrhoid 05/21/2019   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   Raynaud's syndrome 02/09/2019   Osteoarthritis of thumb, 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   Fatigue 12/19/2017   Acute left-sided low back pain without sciatica 12/19/2017   SVT (supraventricular tachycardia) (HCC) 08/31/2017   Peripheral neuropathy 08/31/2017   Dry eyes 09/21/2015   Atopic dermatitis 03/13/2015   IBS (irritable bowel syndrome) 03/18/2014   Osteoporosis 03/18/2014   Major depressive disorder, recurrent episode, moderate (HCC) 03/18/2014   Hypothyroidism 12/14/2013    PCP: Avelina No, MD  REFERRING PROVIDER: Marchia Drivers, MD  REFERRING DIAG: Lumbar radiculopathy, strain of muscle, fascia and tendon of the posterior muscle group of the thigh level   Rationale for Evaluation and Treatment: Rehabilitation  THERAPY DIAG:  Other muscle spasm  Pain in left hip  Muscle weakness (generalized)  Other low back pain  ONSET DATE: 2 months ago (flare up) after chronic/intermittent   SUBJECTIVE:  SUBJECTIVE STATEMENT: Patient reports she is doing okay today. Her interstitial cystitis is acting up.    From Eval: Pt presents to PT with Lt LE pain that began December 2024.  She has had this pain before and had PT ~1.5 years ago.  Imaging shows labral tear of the left hip, ischiofemoral impingement, tendiopathic and frayed hamstring origin.  She is active in pelvic PT for pudendal nerve entrapment.   PERTINENT  HISTORY:  Depression, osteopenia/porosis,Raynaud's, Lt hip labral tear and frayed hamstring origin, IBS  PAIN: 02/08/23 Are you having pain? Yes: NPRS scale: 0/10  Pain location: Lt hamstring insertion and gluteal  Pain description: aching, constant  Aggravating factors: sitting long periods, after exercise Relieving factors: exercising (during)  PRECAUTIONS: None  RED FLAGS: None   WEIGHT BEARING RESTRICTIONS: No  FALLS:  Has patient fallen in last 6 months? No  LIVING ENVIRONMENT: Lives with: lives with their spouse Lives in: House/apartment Stairs: No Has following equipment at home: None  OCCUPATION: retired runner, broadcasting/film/video   PLOF: Independent and Leisure: exercises, walking/biking, reading   PATIENT GOALS: reduce Lt LE pain, sit without pain   NEXT MD VISIT: none scheduled   OBJECTIVE:  Note: Objective measures were completed at Evaluation unless otherwise noted.  DIAGNOSTIC FINDINGS:  10/2021 MRI Lt hip:  Mild Lt hip osteoarthropathy, labral tear of the left hip, ischiofemoral impingement, tendiopathic and frayed hamstring origin  02/13/2023 Xray lumbar spine: lumbar degenerative curve to the Lt. Significant disc space narrowing on the convex side of curvature L2-L4;L4-5;L5-SI. Significant degenerative disc disease at L3.     PATIENT SURVEYS:  LEFS 60/64 (pt didn't answer questions 16-19 due to NA)  COGNITION: Overall cognitive status: Within functional limits for tasks assessed     SENSATION: WFL  MUSCLE LENGTH: Limited IR/ER bil hips by 25%.    POSTURE: rounded shoulders and forward head  PALPATION: Trigger points in Lt gluteals and reduced spinal mobility with PA Mobs in thoracic and lumbar spine  LUMBAR ROM:  Full lumbar A/ROM without pain  LOWER EXTREMITY ROM:      LOWER EXTREMITY MMT:    LOWER EXTREMITY MMT:   MMT Right eval Left eval  Hip abduction 5/5 5/5  Hip adduction      Hip internal rotation 3/5 4-/5  Hip external rotation 3/5 4/5      GAIT: Distance walked: 75 Assistive device utilized: None Level of assistance: Complete Independence Comments: mild scissoring bil Step-ups: Rt and Lt with hip adduction bil  TREATMENT DATE:  02/16/2023 NuStep Level 4 - PT present to discuss status Sidelying clamshells with blue loop 2 x 10 bilateral  Sidelying reverse clamshells with blue loop 2 x 10 bilateral  Single leg bridge 2 x 8 bilateral  Leg Press seat 7 70#  2 x 10 bilateral ; 50# unilateral x 20 Prone hamstring curl 2 x 10 bilateral 3# AW    02/14/2023 NuStep Level 4 - PT present to discuss status Hip Matrix (abduction & hip flexion) 2 x 10 bilateral 30# Leg Press seat 7 70#  2 x 10 bilateral ; 50# unilateral x 20 Monster walks (forwards & backwards) with yellow loop x 4 laps Single leg deadlifts 5# KB 2 x 10 bilateral Standing hamstring curl 2 x 10 bilateral  6 in step step ups + knee drive holding 5# KB x 10 each leg 6in lateral step ups x 10 each leg   02/08/2023 NuStep Level 4 - PT present to discuss status Form correction form rolling  Lt hip and hamstring  Hip Matrix (extension & abduction) 2 x 10 bilateral 30# Leg Press seat 7 70# with blue loop around knees 2 x 10 bilateral ; 40# unilateral x 20 Monster walks (forwards & backwards) with yellow loop x 4 laps Single leg deadlifts 5# KB 2 x 10 bilateral     02/06/2023 NuStep Level 3 6 mins- PT present to discuss status Form correction for QL stretch Hip Matrix (flexion & abduction) 2 x 10 bilateral 25# Leg Press seat 7 65# with blue loop around knees 2 x 10 bilateral ; 35# unilateral x 20 6 in Step up + knee drive 5# KB 2 x 10 bilateral  Sit stepping with blue loop around knees x 4 Single leg deadlifts 5# KB 2 x 10 bilateral                                                                                                                                PATIENT EDUCATION:  Education details: Access Code: [email or web address] Person educated:  Patient Education method: Explanation, Demonstration, and Handouts Education comprehension: verbalized understanding and returned demonstration  HOME EXERCISE PROGRAM: Access Code: [email or web address] URL: [email or web address] Date: 02/16/2023 Prepared by: Kristeen Sar  Exercises - Seated Figure 4 Piriformis Stretch  - 2-3 x daily - 7 x weekly - 3 sets - 10 reps - Clamshell  - 1 x daily - 7 x weekly - 2 sets - 10 reps - Sidelying Reverse Clamshell  - 1 x daily - 7 x weekly - 2 sets - 10 reps - Forward T with Counter Support  - 1 x daily - 7 x weekly - 1 sets - 10 reps - Standing Hip Flexion March  - 1 x daily - 7 x weekly - 2 sets - 10 reps - Supine Bridge  - 1 x daily - 7 x weekly - 2 sets - 10 reps - Half Kneeling Hip Flexor Stretch with Sidebend  - 1 x daily - 7 x weekly - 3 sets - 10 reps - Side Stepping with Resistance at Thighs  - 1 x daily - 7 x weekly - 2 sets - Single Leg Deadlift with Kettlebell  - 1 x daily - 7 x weekly - 2 sets - 10 reps - Runner's Step Up/Down  - 1 x daily - 7 x weekly - 2 sets - 10 reps - Single Leg Bridge  - 1 x daily - 7 x weekly - 2 sets - 10 reps - Prone Hamstring Curl with Anchored Resistance  - 1 x daily - 7 x weekly - 2 sets - 10 reps  ASSESSMENT:  CLINICAL IMPRESSION: Today's treatment session focused on hip stability and strengthening. Patient tolerated progressions with clamshells and bridging exercises. Noted good pelvic stability with single leg bridge exercise. Educated patient on muscle activation during clamshells and reverse clamshells exercise. Patient tolerated treatment session well and did not verbalize any increased pain or discomfort. Updated  HEP to include core progressions. Patient will benefit from skilled PT to address the below impairments and improve overall function.     OBJECTIVE IMPAIRMENTS: decreased ROM, decreased strength, increased fascial restrictions, increased muscle spasms, impaired flexibility, and pain.    ACTIVITY LIMITATIONS: sitting  PARTICIPATION LIMITATIONS: driving  PERSONAL FACTORS: Age and 1-2 comorbidities: IBS, Lt hip labral tear and hamstring tear  are also affecting patient's functional outcome.   REHAB POTENTIAL: Good  CLINICAL DECISION MAKING: Stable/uncomplicated  EVALUATION COMPLEXITY: Low   GOALS: Goals reviewed with patient? Yes  SHORT TERM GOALS: Target date: 02/28/2023    Be independent in initial HEP Baseline: Goal status: MET 02/14/2023  2.  Report > or = to 30% reduction in Lt hip and gluteal pain with sitting  Baseline: 2-5/10 Goal status: IN PROGRESS 02/14/2023    LONG TERM GOALS: Target date: 03/28/2023    Be independent in advanced HEP Baseline:  Goal status: IN PROGRESS 02/14/2023  2.  Report > or = to 60% reduction in Lt hip and gluteal pain with sitting  Baseline:  Goal status: IN PROGRESS 02/14/2023  3.  Improve LEFS to > or = to 63/64 to improve function  Baseline:  Goal status: IN PROGRESS 02/14/2023  4.  Verbalie and demonstrate self-care strategies to manage pain including tissue mobility practices and change of position Baseline:  Goal status: IN PROGRESS 02/14/2023    PLAN:  PT FREQUENCY: 2x/week  PT DURATION: 8 weeks  PLANNED INTERVENTIONS: 97110-Therapeutic exercises, 97530- Therapeutic activity, 97112- Neuromuscular re-education, 97535- Self Care, [id number]- Manual therapy, [id number]- Gait training, [phone]- Canalith repositioning, [id number]- Aquatic Therapy, [id number]- Electrical stimulation (unattended), [id number]- Electrical stimulation (manual), [id number]- Traction (mechanical), Patient/Family education, Balance training, Stair training, Taping, Dry Needling, Joint mobilization, Joint manipulation, Spinal manipulation, Vestibular training, Cryotherapy, and Moist heat.  PLAN FOR NEXT SESSION: assess updated HEP; continue hip stability & strengthening  Kristeen Sar, PT 02/16/23 11:54 AM [facility] [address] [place], [place] [address] Phone # [phone] Fax [phone]

## 2023-02-23 ENCOUNTER — Encounter: Payer: Self-pay | Admitting: Physical Therapy

## 2023-03-01 ENCOUNTER — Encounter: Payer: Self-pay | Admitting: Family Medicine

## 2023-03-01 ENCOUNTER — Other Ambulatory Visit: Payer: Self-pay | Admitting: Family Medicine

## 2023-03-01 DIAGNOSIS — R202 Paresthesia of skin: Secondary | ICD-10-CM

## 2023-03-10 ENCOUNTER — Ambulatory Visit: Payer: Self-pay | Admitting: Family Medicine

## 2023-03-10 ENCOUNTER — Ambulatory Visit: Payer: Medicare Other | Admitting: Family Medicine

## 2023-03-10 ENCOUNTER — Encounter: Payer: Self-pay | Admitting: Family Medicine

## 2023-03-10 VITALS — BP 120/80 | HR 69 | Temp 98.3°F | Ht 66.0 in | Wt 109.2 lb

## 2023-03-10 DIAGNOSIS — K580 Irritable bowel syndrome with diarrhea: Secondary | ICD-10-CM | POA: Diagnosis not present

## 2023-03-10 DIAGNOSIS — F411 Generalized anxiety disorder: Secondary | ICD-10-CM

## 2023-03-10 DIAGNOSIS — F331 Major depressive disorder, recurrent, moderate: Secondary | ICD-10-CM

## 2023-03-10 DIAGNOSIS — R42 Dizziness and giddiness: Secondary | ICD-10-CM | POA: Insufficient documentation

## 2023-03-10 DIAGNOSIS — F410 Panic disorder [episodic paroxysmal anxiety] without agoraphobia: Secondary | ICD-10-CM

## 2023-03-10 DIAGNOSIS — E039 Hypothyroidism, unspecified: Secondary | ICD-10-CM

## 2023-03-10 MED ORDER — BUPROPION HCL ER (XL) 150 MG PO TB24
150.0000 mg | ORAL_TABLET | Freq: Every day | ORAL | 1 refills | Status: DC
Start: 2023-03-10 — End: 2023-07-25

## 2023-03-10 NOTE — Assessment & Plan Note (Signed)
 Acute, description sounds somewhat like BPPV but we will evaluate with labs to make sure no new electrolyte issue thyroid problem or B12 deficiency causing symptoms.  Dix-Hallpike was negative in office today. No clear evidence of dehydration or presyncope.  No evidence of orthostasis.

## 2023-03-10 NOTE — Telephone Encounter (Signed)
 Chief Complaint: diarrhea Symptoms: diarrhea, dizziness Frequency: chronic, worsening since yesterday Pertinent Negatives: Patient denies CP, SOB, fever Disposition: [] ED /[] Urgent Care (no appt availability in office) / [x] Appointment(In office/virtual)/ []  [facility]/ [] Home Care/ [] Refused Recommended Disposition /[] [place] [facility]/ []  Follow-up with PCP Additional Notes: Patient calls reporting worsening diarrhea that began yesterday and six episodes this morning. Patient states she has IBS, but feels this is worse than normal. Per protocol, patient to be evaluated within 4 hours. First available appointment with PCP is is within timeframe, patient states she has an appt with urology this morning that she cannot miss and requests first afternoon appt with PCP. Patient scheduled with PCP for today at 1440. Care advice reviewed, patient verbalized understanding and denies further questions at this time. Alerting PCP for review.    Copied from CRM [id number]. Topic: Clinical - Red Word Triage >> Mar 10, 2023  7:41 AM Pascal Lux wrote: Red Word that prompted transfer to Nurse Triage: Patient has an IVF.Marland Kitchen She is supposed to see a kidney specialist today and currently experiencing diarrhea and dizziness / vertigo. Reason for Disposition  [1] SEVERE diarrhea (e.g., 7 or more times / day more than normal) AND [2] age > 60 years  Answer Assessment - Initial Assessment Questions 1. DIARRHEA SEVERITY: "How bad is the diarrhea?" "How many more stools have you had in the past 24 hours than normal?"    - NO DIARRHEA (SCALE 0)   - MILD (SCALE 1-3): Few loose or mushy BMs; increase of 1-3 stools over normal daily number of stools; mild increase in ostomy output.   -  MODERATE (SCALE 4-7): Increase of 4-6 stools daily over normal; moderate increase in ostomy output.   -  SEVERE (SCALE 8-10; OR "WORST POSSIBLE"): Increase of 7 or more stools daily over normal; moderate increase in ostomy  output; incontinence.     6 Times since waking this morning 2. ONSET: "When did the diarrhea begin?"      Reports IBS, yesterday began having extreme diarrhea 3. BM CONSISTENCY: "How loose or watery is the diarrhea?"      watery 4. VOMITING: "Are you also vomiting?" If Yes, ask: "How many times in the past 24 hours?"      Denies 5. ABDOMEN PAIN: "Are you having any abdomen pain?" If Yes, ask: "What does it feel like?" (e.g., crampy, dull, intermittent, constant)      Denies 6. ABDOMEN PAIN SEVERITY: If present, ask: "How bad is the pain?"  (e.g., Scale 1-10; mild, moderate, or severe)   - MILD (1-3): doesn't interfere with normal activities, abdomen soft and not tender to touch    - MODERATE (4-7): interferes with normal activities or awakens from sleep, abdomen tender to touch    - SEVERE (8-10): excruciating pain, doubled over, unable to do any normal activities       Denies 7. ORAL INTAKE: If vomiting, "Have you been able to drink liquids?" "How much liquids have you had in the past 24 hours?"     Drinking adequately 8. HYDRATION: "Any signs of dehydration?" (e.g., dry mouth [not just dry lips], too weak to stand, dizziness, new weight loss) "When did you last urinate?"     Dizziness  9. EXPOSURE: "Have you traveled to a foreign country recently?" "Have you been exposed to anyone with diarrhea?" "Could you have eaten any food that was spoiled?"     Denies 10. ANTIBIOTIC USE: "Are you taking antibiotics now or have you  taken antibiotics in the past 2 months?"       Denies 11. OTHER SYMPTOMS: "Do you have any other symptoms?" (e.g., fever, blood in stool)       Dizziness x 2 weeks at some degree, this morning it is intense 12. PREGNANCY: "Is there any chance you are pregnant?" "When was your last menstrual period?"       NA  Protocols used: [facility]

## 2023-03-10 NOTE — Progress Notes (Signed)
 Patient ID: Jessica Miles, female    DOB: 1945/07/30, 78 y.o.   MRN: [id number]  This visit was conducted in person.  BP 120/80 (BP Location: Left Arm, Patient Position: Sitting, Cuff Size: Normal)   Pulse 69   Temp 98.3 F (36.8 C) (Temporal)   Ht 5\' 6"  (1.676 m)   Wt 109 lb 4 oz (49.6 kg)   SpO2 99%   BMI 17.63 kg/m    CC:  Chief Complaint  Patient presents with   Diarrhea    Worsening   Dizziness   Skaky Hands   Cold Sensation    Subjective:   HPI: Jessica Miles is a 78 y.o. female presenting on 03/10/2023 for Diarrhea (Worsening), Dizziness, Skaky Hands, and Cold Sensation  Patient with history of chronic diarrhea secondary to IBS.  She also has history of hypothyroidism .  She reports now in the last  4-5 week... she has noted  worsening of interstitial cystitis.Marland Kitchen treats with Azo. Seeing Uro gyn.. 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  Then  she noted trend toward eats 1-2 hours after she has BM and thin soft stools. Today had severe explosive loose BM. Has spells of waves off cold through body ongoing x month since COVID, has worsened.  Cramps in hands an toes for months intermittently   She has been feeling more anxious and shakiness ongoing for  4 weeks.  Feeling more off balance, room spinning, cannot look up without triggering vertigo. Walking upright troiggers.  No presyncope. She has been more anxious about friend with brain aneurysm.   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  Felt intestinal irritation  is causing irritated bladder.   2 uncles with brain aneurysm.  202  MRI brain: IMPRESSION: This MRI of the brain with and without contrast shows the following:  1.   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  No other chronic ischemic foci are noted.  2.   Brain volume is normal for age.  3.   No acute findings.  Specifically, no DWI abnormality in the hippocampus.  4.   Normal  enhancement pattern.     PHQ9:9  GAD7 13   Using wellbutrin SR 100 mg  once a day... not taking   Has buspar 5 mg  BID  prn.. but has not used.   Has not been taking cholestipol.    Nml tsh  Relevant past medical, surgical, family and social history reviewed and updated as indicated. Interim medical history since our last visit reviewed. Allergies and medications reviewed and updated. Outpatient Medications Prior to Visit  Medication Sig Dispense Refill   alendronate (FOSAMAX) 70 MG tablet Take 1 tablet (70 mg total) by mouth every 7 (seven) days. Take with a full glass of water on an empty stomach. 4 tablet 11   ALPHA LIPOIC ACID PO Take 100 mg by mouth daily.     ascorbic acid (VITAMIN C) 500 MG tablet Take 500 mg by mouth 2 (two) times daily.      atenolol (TENORMIN) 25 MG tablet Take 25 mg by mouth daily as needed (palpitations/pulse racing).     b complex vitamins capsule Take 1 capsule by mouth daily.     busPIRone (BUSPAR) 5 MG tablet Take 1 tablet (5 mg total) by mouth 2 (two) times daily as needed (anxiety). 180 tablet 0   Calcium Carb-Cholecalciferol (CALCIUM 600 + D PO) Take 1 tablet by  mouth in the morning. 600 mg /400 mcg     Cholecalciferol (VITAMIN D-3 PO) Take 5,000 Units by mouth in the morning.     colestipol (COLESTID) 1 g tablet Take 2 tablets (2 g total) by mouth 2 (two) times daily. 360 tablet 3   esomeprazole (NEXIUM) 40 MG capsule Take 40 mg by mouth daily as needed.     estradiol (ESTRACE) 0.1 MG/GM vaginal cream 0.5 g of cream intravaginally administered daily at bedtime for 2 weeks, then reduce to twice weekly 42.5 g 12   hydrocortisone-pramoxine (ANALPRAM-HC) 2.5-1 % rectal cream Place 1 Application rectally as needed for hemorrhoids or anal itching. 30 g 1   ketoconazole (NIZORAL) 2 % cream Apply 1 Application topically 2 (two) times daily as needed for irritation.  Apply between the third and fourth toe twice a day as needed     levothyroxine (SYNTHROID)  100 MCG tablet Take 1 tablet (100 mcg total) by mouth daily.     Lifitegrast (XIIDRA) 5 % SOLN Place 1 drop into both eyes 2 (two) times daily.     MAGNESIUM PO Take 125 mg by mouth daily.     Methylcellulose, Laxative, (CITRUCEL PO) Take 1 Dose by mouth 2 (two) times daily between meals as needed.     MULTIPLE VITAMIN PO Take 1 tablet by mouth in the morning.     mupirocin cream (BACTROBAN) 2 % Apply 1 Application topically 2 (two) times daily as needed.     nitroGLYCERIN (NITRODUR - DOSED IN MG/24 HR) 0.2 mg/hr patch Place 0.2 mg onto the skin daily as needed.     triamcinolone cream (KENALOG) 0.5 % Apply 1 Application topically 2 (two) times daily as needed.     TYRVAYA 0.03 MG/ACT SOLN Place 1 spray into both nostrils daily as needed (dry eyes).     buPROPion ER (WELLBUTRIN SR) 100 MG 12 hr tablet TAKE 1 TABLET TWICE A DAY 180 tablet 3   esomeprazole (NEXIUM) 40 MG capsule Take 1 capsule (40 mg total) by mouth daily. (Patient taking differently: Take 40 mg by mouth daily as needed.) 30 capsule 1   ketoconazole (NIZORAL) 2 % cream Apply between the third and fourth toe twice a day (Patient taking differently: Apply 1 Application topically 2 (two) times daily as needed for irritation (podiatry).) 60 g 2   mupirocin cream (BACTROBAN) 2 % Apply 1 application topically 2 (two) times daily. (Patient taking differently: Apply 1 application  topically 2 (two) times daily as needed.) 15 g 2   nitroGLYCERIN (NITRO-DUR) 0.2 mg/hr patch Place 1 patch (0.2 mg total) onto the skin daily. (Patient taking differently: Place 0.2 mg onto the skin daily as needed (When walking).) 30 patch 12   thiamine (VITAMIN B-1) 100 MG tablet Take 100 mg by mouth daily.     triamcinolone cream (KENALOG) 0.5 % Apply 1 Application topically 2 (two) times daily. 30 g 0   No facility-administered medications prior to visit.     Per HPI unless specifically indicated in ROS section below Review of Systems  Constitutional:   Negative for fatigue and fever.  HENT:  Negative for congestion.   Eyes:  Negative for pain.  Respiratory:  Negative for cough and shortness of breath.   Cardiovascular:  Negative for chest pain, palpitations and leg swelling.  Gastrointestinal:  Positive for diarrhea. Negative for abdominal pain.  Genitourinary:  Negative for dysuria and vaginal bleeding.  Musculoskeletal:  Negative for back pain.  Neurological:  Positive for  dizziness. Negative for syncope, light-headedness and headaches.  Psychiatric/Behavioral:  Negative for dysphoric mood.    Objective:  BP 120/80 (BP Location: Left Arm, Patient Position: Sitting, Cuff Size: Normal)   Pulse 69   Temp 98.3 F (36.8 C) (Temporal)   Ht 5\' 6"  (1.676 m)   Wt 109 lb 4 oz (49.6 kg)   SpO2 99%   BMI 17.63 kg/m   Wt Readings from Last 3 Encounters:  03/10/23 109 lb 4 oz (49.6 kg)  01/25/23 110 lb (49.9 kg)  12/16/22 112 lb (50.8 kg)      Physical Exam Constitutional:      General: She is not in acute distress.    Appearance: Normal appearance. She is well-developed. She is not ill-appearing or toxic-appearing.  HENT:     Head: Normocephalic.     Right Ear: Hearing, tympanic membrane, ear canal and external ear normal. Tympanic membrane is not erythematous, retracted or bulging.     Left Ear: Hearing, tympanic membrane, ear canal and external ear normal. Tympanic membrane is not erythematous, retracted or bulging.     Nose: No mucosal edema or rhinorrhea.     Right Sinus: No maxillary sinus tenderness or frontal sinus tenderness.     Left Sinus: No maxillary sinus tenderness or frontal sinus tenderness.     Mouth/Throat:     Pharynx: Uvula midline.  Eyes:     General: Lids are normal. Lids are everted, no foreign bodies appreciated.     Conjunctiva/sclera: Conjunctivae normal.     Pupils: Pupils are equal, round, and reactive to light.  Neck:     Thyroid: No thyroid mass or thyromegaly.     Vascular: No carotid bruit.      Trachea: Trachea normal.  Cardiovascular:     Rate and Rhythm: Normal rate and regular rhythm.     Pulses: Normal pulses.     Heart sounds: Normal heart sounds, S1 normal and S2 normal. No murmur heard.    No friction rub. No gallop.  Pulmonary:     Effort: Pulmonary effort is normal. No tachypnea or respiratory distress.     Breath sounds: Normal breath sounds. No decreased breath sounds, wheezing, rhonchi or rales.  Abdominal:     General: Bowel sounds are normal.     Palpations: Abdomen is soft.     Tenderness: There is no abdominal tenderness.  Musculoskeletal:     Cervical back: Normal range of motion and neck supple.  Skin:    General: Skin is warm and dry.     Findings: No rash.  Neurological:     Mental Status: She is alert.  Psychiatric:        Mood and Affect: Mood is depressed. Mood is not anxious. Affect is tearful.        Speech: Speech normal.        Behavior: Behavior normal. Behavior is cooperative.        Thought Content: Thought content normal. Thought content does not include homicidal or suicidal plan.        Cognition and Memory: Cognition normal.        Judgment: Judgment normal.       Results for orders placed or performed in visit on 01/25/23  Hepatic Function Panel   Collection Time: 01/25/23  7:23 AM  Result Value Ref Range   Total Bilirubin 0.7 0.2 - 1.2 mg/dL   Bilirubin, Direct 0.0 0.0 - 0.3 mg/dL   Alkaline Phosphatase 61 39 - 117 U/L  AST 34 0 - 37 U/L   ALT 29 0 - 35 U/L   Total Protein 7.2 6.0 - 8.3 g/dL   Albumin 4.5 3.5 - 5.2 g/dL  Lipid panel   Collection Time: 01/25/23  7:23 AM  Result Value Ref Range   Cholesterol 206 (H) 0 - 200 mg/dL   Triglycerides 81.1 0.0 - 149.0 mg/dL   HDL 91.47 >82.95 mg/dL   VLDL 62.1 0.0 - 30.8 mg/dL   LDL Cholesterol 657 (H) 0 - 99 mg/dL   Total CHOL/HDL Ratio 3    NonHDL 128.55     Assessment and Plan  Irritable bowel syndrome with diarrhea Assessment & Plan: Acute flare of IBS diarrhea.   Worsening in the last several weeks now with severe symptoms in the last 24 hours.  We discussed restarting cholestyramine to treat diarrhea. Encouraged p.o. intake and hydration. Her anxiety and depression  has worsened along with worsening IBS.    Major depressive disorder, recurrent episode, moderate (HCC) Assessment & Plan: Acute worsening of chronic issue.  This is possibly in part what is worsening her IBS, interstitial cystitis and possibly causing additional symptoms. Also associated with significant anxiety. Discussed how she is only taking 1 tablet of her twice daily Wellbutrin.  We will change this to long-acting Wellbutrin ER and increase the dose to 150 mg daily. She can also use BuSpar 5 mg every 12 hours as needed for anxiety.  We discussed possible side effects to these medications.   GAD (generalized anxiety disorder)  Panic attacks  Vertigo Assessment & Plan: Acute, description sounds somewhat like BPPV but we will evaluate with labs to make sure no new electrolyte issue thyroid problem or B12 deficiency causing symptoms.  Dix-Hallpike was negative in office today. No clear evidence of dehydration or presyncope.  No evidence of orthostasis.    Orders: -     Comprehensive metabolic panel; Future -     CBC with Differential/Platelet; Future -     Vitamin B12; Future  Hypothyroidism, unspecified type -     TSH; Future  Other orders -     buPROPion HCl ER (XL); Take 1 tablet (150 mg total) by mouth daily.  Dispense: 90 tablet; Refill: 1    No follow-ups on file.   Kerby Nora, MD

## 2023-03-10 NOTE — Assessment & Plan Note (Addendum)
 Acute flare of IBS diarrhea.  Worsening in the last several weeks now with severe symptoms in the last 24 hours.  We discussed restarting cholestyramine to treat diarrhea. Encouraged p.o. intake and hydration. Her anxiety and depression  has worsened along with worsening IBS.

## 2023-03-10 NOTE — Telephone Encounter (Signed)
 Noted.

## 2023-03-10 NOTE — Patient Instructions (Signed)
 Restart colestipol for acute IBS diarrhea.  Push fluids and keep up with hydration. We will increase Wellbutrin and changed to long-acting: Wellbutrin ER 150 mg p.o. daily taken in the morning. You can use the BuSpar 5 mg p.o. every 12 hours as needed for anxiety. Work on stress reduction and relaxation. I will call you with lab result evaluation. Call with an update in the next several weeks.

## 2023-03-10 NOTE — Assessment & Plan Note (Signed)
 Acute worsening of chronic issue.  This is possibly in part what is worsening her IBS, interstitial cystitis and possibly causing additional symptoms. Also associated with significant anxiety. Discussed how she is only taking 1 tablet of her twice daily Wellbutrin.  We will change this to long-acting Wellbutrin ER and increase the dose to 150 mg daily. She can also use BuSpar 5 mg every 12 hours as needed for anxiety.  We discussed possible side effects to these medications.

## 2023-03-11 LAB — COMPREHENSIVE METABOLIC PANEL
AG Ratio: 1.9 (calc) (ref 1.0–2.5)
ALT: 30 U/L — ABNORMAL HIGH (ref 6–29)
AST: 25 U/L (ref 10–35)
Albumin: 4.5 g/dL (ref 3.6–5.1)
Alkaline phosphatase (APISO): 67 U/L (ref 37–153)
BUN/Creatinine Ratio: 21 (calc) (ref 6–22)
BUN: 22 mg/dL (ref 7–25)
CO2: 24 mmol/L (ref 20–32)
Calcium: 9.5 mg/dL (ref 8.6–10.4)
Chloride: 104 mmol/L (ref 98–110)
Creat: 1.03 mg/dL — ABNORMAL HIGH (ref 0.60–1.00)
Globulin: 2.4 g/dL (ref 1.9–3.7)
Glucose, Bld: 83 mg/dL (ref 65–99)
Potassium: 4 mmol/L (ref 3.5–5.3)
Sodium: 141 mmol/L (ref 135–146)
Total Bilirubin: 0.7 mg/dL (ref 0.2–1.2)
Total Protein: 6.9 g/dL (ref 6.1–8.1)

## 2023-03-11 LAB — CBC WITH DIFFERENTIAL/PLATELET
Absolute Lymphocytes: 2140 {cells}/uL (ref 850–3900)
Absolute Monocytes: 429 {cells}/uL (ref 200–950)
Basophils Absolute: 52 {cells}/uL (ref 0–200)
Basophils Relative: 0.9 %
Eosinophils Absolute: 70 {cells}/uL (ref 15–500)
Eosinophils Relative: 1.2 %
HCT: 42.8 % (ref 35.0–45.0)
Hemoglobin: 14 g/dL (ref 11.7–15.5)
MCH: 30.4 pg (ref 27.0–33.0)
MCHC: 32.7 g/dL (ref 32.0–36.0)
MCV: 92.8 fL (ref 80.0–100.0)
MPV: 11.3 fL (ref 7.5–12.5)
Monocytes Relative: 7.4 %
Neutro Abs: 3109 {cells}/uL (ref 1500–7800)
Neutrophils Relative %: 53.6 %
Platelets: 257 10*3/uL (ref 140–400)
RBC: 4.61 10*6/uL (ref 3.80–5.10)
RDW: 12 % (ref 11.0–15.0)
Total Lymphocyte: 36.9 %
WBC: 5.8 10*3/uL (ref 3.8–10.8)

## 2023-03-11 LAB — TSH: TSH: 1.1 m[IU]/L (ref 0.40–4.50)

## 2023-03-11 LAB — VITAMIN B12: Vitamin B-12: 1001 pg/mL (ref 200–1100)

## 2023-03-14 ENCOUNTER — Encounter: Payer: Self-pay | Admitting: Family Medicine

## 2023-04-13 ENCOUNTER — Ambulatory Visit: Payer: Medicare Other | Attending: Obstetrics and Gynecology

## 2023-04-13 DIAGNOSIS — M62838 Other muscle spasm: Secondary | ICD-10-CM | POA: Diagnosis present

## 2023-04-13 DIAGNOSIS — R279 Unspecified lack of coordination: Secondary | ICD-10-CM | POA: Diagnosis present

## 2023-04-13 DIAGNOSIS — M6281 Muscle weakness (generalized): Secondary | ICD-10-CM | POA: Diagnosis present

## 2023-04-13 DIAGNOSIS — M25552 Pain in left hip: Secondary | ICD-10-CM | POA: Insufficient documentation

## 2023-04-13 DIAGNOSIS — R102 Pelvic and perineal pain: Secondary | ICD-10-CM | POA: Insufficient documentation

## 2023-04-13 DIAGNOSIS — R293 Abnormal posture: Secondary | ICD-10-CM | POA: Insufficient documentation

## 2023-04-13 DIAGNOSIS — M5459 Other low back pain: Secondary | ICD-10-CM | POA: Diagnosis present

## 2023-04-13 NOTE — Patient Instructions (Signed)

## 2023-04-13 NOTE — Therapy (Signed)
 OUTPATIENT PHYSICAL THERAPY FEMALE PELVIC TREATMENT   Patient Name: Jessica Miles MRN: [id number] DOB:May 03, 1945, 78 y.o., female Today's Date: 04/13/2023  END OF SESSION:  PT End of Session - 04/13/23 1140     Visit Number 12    Date for PT Re-Evaluation 07/06/23    Authorization Type Medicare - needs Kx at 15    Progress Note Due on Visit 20    PT Start Time 1145    PT Stop Time 1225    PT Time Calculation (min) 40 min    Activity Tolerance Patient tolerated treatment well    Behavior During Therapy [facility] for tasks assessed/performed              Past Medical History:  Diagnosis Date   Allergy    Anxiety    COVID-19 virus infection    09-23-2019- 9-16 had monoclonal infusion    Depression    History of IBS    HPV (human papilloma virus) infection    Hyperlipidemia    83- not taking statin    Loss of taste    Neuropathy 2015   surgery for Mortons neuroma in 1985   Osteopenia    Osteoporosis    Raynaud's disease 02/2020   Tachycardia    Thyroid disease    Past Surgical History:  Procedure Laterality Date   BIOPSY  01/25/2022   Procedure: BIOPSY;  Surgeon: Lemar Lofty., MD;  Location: [facility];  Service: Gastroenterology;;   COLONOSCOPY  2019   hems only    ESOPHAGOGASTRODUODENOSCOPY (EGD) WITH PROPOFOL N/A 01/25/2022   Procedure: ESOPHAGOGASTRODUODENOSCOPY (EGD) WITH PROPOFOL;  Surgeon: Lemar Lofty., MD;  Location: [facility];  Service: Gastroenterology;  Laterality: N/A;   EUS N/A 01/25/2022   Procedure: UPPER ENDOSCOPIC ULTRASOUND (EUS) LINEAR;  Surgeon: Lemar Lofty., MD;  Location: [facility];  Service: Gastroenterology;  Laterality: N/A;   FOOT NEUROMA SURGERY  1985   WISDOM TOOTH EXTRACTION     Patient Active Problem List   Diagnosis Date Noted   Vertigo 03/10/2023   Right upper quadrant abdominal mass 01/26/2023   Panic attacks 10/26/2022   Allergic reaction to insect bite 06/23/2022   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    Severely underweight adult 09/07/2021   Acute diarrhea 04/27/2021   Inflamed sebaceous cyst 02/11/2021   Transient disorientation 10/20/2020   Memory change 08/05/2020   Family history of early CAD 02/27/2020   Tremor 02/27/2020   Aortic atherosclerosis (HCC) 12/19/2019   Urinary urgency 05/21/2019   Bladder irritation 05/21/2019   External hemorrhoid 05/21/2019   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   Raynaud's syndrome 02/09/2019   Osteoarthritis of thumb, 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   Fatigue 12/19/2017   Acute left-sided low back pain without sciatica 12/19/2017   SVT (supraventricular tachycardia) (HCC) 08/31/2017   Peripheral neuropathy 08/31/2017   Dry eyes 09/21/2015   Atopic dermatitis 03/13/2015   IBS (irritable bowel syndrome) 03/18/2014   Osteoporosis 03/18/2014   Major depressive disorder, recurrent episode, moderate (HCC) 03/18/2014   Hypothyroidism 12/14/2013    PCP: Excell Seltzer, MD  REFERRING PROVIDER: Sherre Scarlet, MD  REFERRING DIAG: M79.18 (ICD-10-CM) - Myalgia, other site G58.8 (ICD-10-CM) - Other specified mononeuropathies  THERAPY DIAG:  Other muscle spasm  Pain in left hip  Muscle weakness (generalized)  Other low back pain  Pelvic pain  Abnormal posture  Unspecified lack of coordination  Rationale for Evaluation and Treatment: Rehabilitation  ONSET DATE: 3 years   SUBJECTIVE:  SUBJECTIVE STATEMENT: Pt states that she has been to see urologist for abdominal mass and they are calling it a drooped kidney. She is having a lot of anxiety and depression. She is very troubled about abnormal gas and GI discomfort. She is now seeing a new counselor that is focusing on mindfulness and and relaxation. She states that a lot of her anxiety comes over having gas and gas outside of  her control. She states that she is also seeing a flare in her interstitial cystitis. 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 She is still exercising, but large decrease.   Diet: very limited - does not do grains or starch, squash, spinach, carrots, red pepper, bananas, fish, ground [place]; not drinking much water; she has not eaten for over 36 hours right now due to fear of having gas or bowel movement at appointment; she has lost weight even just this week and is now 101 lbs  PAIN: 04/13/23 Are you having pain? Yes NPRS scale: 2/10 in hip; 2/10 abdomen Pain location:  Lt hip and perineum/vulva; lower abdomen discomfort  Pain type: soreness Pain description: intermittent   Aggravating factors: sitting Relieving factors: lying flat, sitting on toilet  PRECAUTIONS: Other: large mass in Rt lower quadrant that has not been treated by medical team  RED FLAGS: None   WEIGHT BEARING RESTRICTIONS: No  FALLS:  Has patient fallen in last 6 months? No  LIVING ENVIRONMENT: Lives with: lives with their family Lives in: House/apartment  OCCUPATION: retired Runner, broadcasting/film/video  PLOF: Independent  PATIENT GOALS: decrease pain  PERTINENT HISTORY:  Interstitial cystitis   Updated 04/13/23:  BOWEL MOVEMENT: Pain with bowel movement: No Type of bowel movement:Frequency up to 5x/hour anywhere from solid to diarrhea; she will go 1-2 hours after eating on average and Strain No Fully empty rectum: No Leakage: Yes: seed size stool can come out without sensation when walking    Pads: No Fiber supplement: No  URINATION: Pain with urination: No Fully empty bladder: Yes: - Stream: Weak Urgency: No - but not drinking much and goes every time she walks by the bathroom  Frequency: as often as she can - very small amounts  Leakage:  none Pads: No  INTERCOURSE: NA  PREGNANCY: Vaginal deliveries 2 Tearing No C-section deliveries 0 Currently pregnant  No  PROLAPSE: None   OBJECTIVE:  Note: Objective measures were completed at Evaluation unless otherwise noted. 04/13/23:               External Perineal Exam dryness, tenderness, labial fusion bil                             Internal Pelvic Floor Pt has increased bil pelvic floor muscle tenderness and tightness; bladder restriction and reproduction of urgency with palpation; reproduction of lower abdominal pain with palpation of tight anterior Rt pelvic floor muscles  Patient confirms identification and approves PT to assess internal pelvic floor and treatment Yes   PELVIC MMT:    MMT eval  Vaginal 2/5, 5 second hold, 5 repeat contractions  (Blank rows = not tested)        TONE: High Bil  01/26/23:               External Perineal Exam dryness, tenderness, labial fusion bil  Internal Pelvic Floor significant superficial tenderness with report of burning, pinching and stinging; muscle spasm and aching in Lt deep pelvic floor muscle   Patient confirms identification and approves PT to assess internal pelvic floor and treatment Yes   PELVIC MMT:    MMT eval  Vaginal 1/5, 4 second hold, 6 repeat contractions  (Blank rows = not tested)        TONE: High Lt side  PROLAPSE: Not tested   01/24/23: 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, swelling of quadratus femoris muscle, tendinopathic and frayed hamstring origin Scheduled for anal manometry in April 2025   COGNITION: Overall cognitive status: Within functional limits for tasks assessed     SENSATION: Light touch: Appears intact Proprioception: Appears intact   FUNCTIONAL/SPECIAL TESTS:  Single leg stance:  Rt: more stable, some Lt drop - compensated trendelenburg  Lt: less stable, more Rt drop - compensated trendelenburg Squat: Lt weight shift, Rt valgus knee collapse FAIR: (+) Lt hip Hamstring length: significant  decrease with pain on Lt, -75 degrees Curl-up test: breath holding, but no distortion, 2 finger width diastasis in upper abdominals Bed mobility: breath holding  Palpable transversus abdominus contraction bil, but weak Tenderness at Lt ischial tuberosity and hamstrings/Lt posterolateral hip mm  GAIT: Comments: WNL  POSTURE: forward head, decreased lumbar lordosis, posterior pelvic tilt, and Lt lower thoracic/upper lumbar curvature, elevated Lt iliac crest  PELVIC ALIGNMENT: Lt posterior rotation, Lt sacral facing rotation   LUMBARAROM/PROM:  A/PROM A/PROM  Eval (% available)  Flexion 75, pain on LT  Extension 75  Right lateral flexion 100  Left lateral flexion 100, pain in back and Lt pelvis  Right rotation 75  Left rotation 50, feels good on back   (Blank rows = not tested)   LOWER EXTREMITY MMT:  MMT Right eval Left eval  Hip abduction 5/5 5/5  Hip adduction    Hip internal rotation 3/5 4-/5  Hip external rotation 3/5 4/5   PALPATION:   General: Large mass located in Rt abdomen lateral to umbilicus, non-tender, but with pressure it caused discomfort in low back - patient had not noticed before and reports no doctor has mentioned to her before    TODAY'S TREATMENT:                                                                                                                              DATE:  04/13/23 RE-EVAL Manual: Pt provides verbal consent for internal vaginal/rectal pelvic floor exam. Internal vaginal pelvic floor muscle reassessment Therapeutic activities: Urge drill Voiding schedule - avoiding just in case urinating Increasing food and water intake to help feel better overall Role on anxiety in pelvic floor muscle dysfunction   02/06/23 Manual: Pt provides verbal consent for internal vaginal/rectal pelvic floor exam. Internal pelvic floor muscle release to Lt deep pelvic floor muscles Trigger point release to Lt deep hip rotators  externally Neuromuscular re-education: Transversus abdominus training with multimodal cues for improved motor control and breath coordination  Transversus abdominus isometrics 10x Supine unilateral clam shell red band 10x bil   02/02/23 Manual: Pt provides verbal consent for internal vaginal/rectal pelvic floor exam. Internal vaginal pelvic floor muscle exam Vaginal pelvic floor muscle release to deep Lt layers Trigger Point Dry Needling  Initial Treatment: Pt instructed on Dry Needling rational, procedures, and possible side effects. Pt instructed to expect mild to moderate muscle soreness later in the day and/or into the next day.  Pt instructed in methods to reduce muscle soreness. Pt instructed to continue prescribed HEP. Patient was educated on signs and symptoms of infection and other risk factors and advised to seek medical attention should they occur.  Patient verbalized understanding of these instructions and education.   Patient Verbal Consent Given: Yes Education Handout Provided: Yes Muscles Treated: Lt glute med, proximal adductors, hip flexor, pectineus, and disotal iliopsoas insertion Electrical Stimulation Performed: No Treatment Response/Outcome: twitch response and release Soft tissue mobilization to Lt glutes Lt side lying external obturator internus release  Soft tissue mobilization to Lt adductors/hip flexors  PATIENT EDUCATION:  Education details: See above Person educated: Patient Education method: Programmer, multimedia, Facilities manager, Actor cues, Verbal cues, and Handouts Education comprehension: verbalized understanding  HOME EXERCISE PROGRAM: [id number]  ASSESSMENT:  CLINICAL IMPRESSION: Pt has not been seen in pelvic floor physical therapy for over 2 months. Today re-evaluation performed with review of her goals and current level of dysfunction. She states that she has had significant increase in bowel dysfunction, flare up of interstitial cystitis, and  varying Lt sided pelvic and vulvar pain. She also reports consistent bil lower abdominal discomfort. She feels like her anxiety is playing a large role in her symptoms, and PT agrees. We also discussed her very limited food and water intake impacting how well she is feeling and that bringing in enough food may help her feel better overall; she was encouraged to talk with new counselor about working through food restriction and anxiety. We also discussed that pelvic floor PT is a great place to test eating normal diet prior to appointments due to being able to discuss these issues. She does demonstrate increased bil pelvic floor muscle restriction and high tone, reproducing lower abdominal pain on the right. Believe working on restriction in pelvic floor and surrounding bladder will help with pain and dysfunction. We also went over bladder voiding schedule due to very high frequency; she was instructed in the urge drill and using to avoid going to the bathroom every time she passes one. She will continue to benefit from skilled PT intervention in order to decrease pelvic pain, improve low back/Lt hip pain,decrease urinary urgency, decrease lower abdominal pain, and begin/progress functional strengthening program.   OBJECTIVE IMPAIRMENTS: decreased activity tolerance, decreased coordination, decreased endurance, decreased mobility, decreased strength, increased fascial restrictions, increased muscle spasms, impaired tone, postural dysfunction, and pain.   ACTIVITY LIMITATIONS: lifting, bending, sitting, squatting, and bed mobility  PARTICIPATION LIMITATIONS: cleaning, driving, and community activity  PERSONAL FACTORS: 1 comorbidity: medical history  are also affecting patient's functional outcome.   REHAB POTENTIAL: Good  CLINICAL DECISION MAKING: Evolving/moderate complexity  EVALUATION COMPLEXITY: Moderate   GOALS: Goals reviewed with patient? Yes  SHORT TERM GOALS: Target date: 02/21/23 - updated  04/13/23  Pt will be independent with HEP.   Baseline: Goal status: MET 04/13/23  2.  Pt will be able to teach back and utilize urge suppression technique in order  to help reduce number of trips to the bathroom.    Baseline:  Goal status: IN PROGRESS 04/13/23  3.  Pt will begin use of pelvic floor muscle wand on regular basis (2-3x/week) in order to help reduce pelvic floor muscle tension and pain. Baseline:  Goal status: IN PROGRESS 04/13/23  4.  Pt will be independent with diaphragmatic breathing and down training activities in order to improve pelvic floor relaxation.  Baseline:  Goal status: IN PROGRESS 04/13/23  5.  Pt will report pelvic pain no greater than 4/10. Baseline:  Goal status: IN PROGRESS 04/13/23   LONG TERM GOALS: Target date: 03/21/23  Pt will be independent with advanced HEP.   Baseline:  Goal status: IN PROGRESS 04/13/23  2.  Pt will be able to go 2-3 hours in between voids without urgency or incontinence in order to improve QOL and perform all functional activities with less difficulty.   Baseline:  Goal status: IN PROGRESS 04/13/23  3.  Pt will report pelvic pain no greater than 2/10.  Baseline:  Goal status: IN PROGRESS 04/13/23  4.  Pt will be able to sit for >30 minutes without any increased pelvic pain.  Baseline:  Goal status: IN PROGRESS 04/13/23  5.  Pt will be able to stand on single leg without pelvic drop and squat without weight shift in order to demonstrate improved functional core and hip strength to more appropriately stabilize pelvis.  Baseline:  Goal status: IN PROGRESS 04/13/23  6.  Pt will demonstrate increase in all impaired hip strength by 1 muscle grades in order to demonstrate improved lumbopelvic support and increase functional ability.   Baseline:  Goal status: IN PROGRESS 04/13/23  PLAN:  PT FREQUENCY: 1-2x/week  PT DURATION: 12 weeks  PLANNED INTERVENTIONS: 97110-Therapeutic exercises, 97530- Therapeutic activity, 97112-  Neuromuscular re-education, 97535- Self Care, [id number]- Manual therapy, Dry Needling, and Biofeedback  PLAN FOR NEXT SESSION: test hamstring strength, internal pelvic floor muscle exam and treatment to tolerance   Julio Alm, PT, DPT04/03/251:47 PM

## 2023-04-18 ENCOUNTER — Ambulatory Visit: Payer: Medicare Other

## 2023-04-18 DIAGNOSIS — R102 Pelvic and perineal pain unspecified side: Secondary | ICD-10-CM

## 2023-04-18 DIAGNOSIS — M62838 Other muscle spasm: Secondary | ICD-10-CM | POA: Diagnosis not present

## 2023-04-18 DIAGNOSIS — R279 Unspecified lack of coordination: Secondary | ICD-10-CM

## 2023-04-18 DIAGNOSIS — R293 Abnormal posture: Secondary | ICD-10-CM

## 2023-04-18 DIAGNOSIS — M25552 Pain in left hip: Secondary | ICD-10-CM

## 2023-04-18 DIAGNOSIS — M5459 Other low back pain: Secondary | ICD-10-CM

## 2023-04-18 DIAGNOSIS — M6281 Muscle weakness (generalized): Secondary | ICD-10-CM

## 2023-04-18 NOTE — Patient Instructions (Signed)
 Vagal toning: Arm flapping Big hip circles both directions Facial muscle distortion Jumping Humming/buzzing    Mindfulness body scan: Automatic Data relaxation body scan 10 minutes

## 2023-04-18 NOTE — Therapy (Signed)
 OUTPATIENT PHYSICAL THERAPY FEMALE PELVIC TREATMENT   Patient Name: Jessica Miles MRN: [id number] DOB:04/11/1945, 78 y.o., female Today's Date: 04/18/2023  END OF SESSION:  PT End of Session - 04/18/23 1058     Visit Number 13    Date for PT Re-Evaluation 05/13/23    Authorization Type Medicare - needs Kx at 15    Progress Note Due on Visit 20    PT Start Time 1100    PT Stop Time 1140    PT Time Calculation (min) 40 min    Activity Tolerance Patient tolerated treatment well    Behavior During Therapy [facility] for tasks assessed/performed              Past Medical History:  Diagnosis Date   Allergy    Anxiety    COVID-19 virus infection    09-23-2019- 9-16 had monoclonal infusion    Depression    History of IBS    HPV (human papilloma virus) infection    Hyperlipidemia    83- not taking statin    Loss of taste    Neuropathy 2015   surgery for Mortons neuroma in 1985   Osteopenia    Osteoporosis    Raynaud's disease 02/2020   Tachycardia    Thyroid disease    Past Surgical History:  Procedure Laterality Date   BIOPSY  01/25/2022   Procedure: BIOPSY;  Surgeon: Lemar Lofty., MD;  Location: [facility];  Service: Gastroenterology;;   COLONOSCOPY  2019   hems only    ESOPHAGOGASTRODUODENOSCOPY (EGD) WITH PROPOFOL N/A 01/25/2022   Procedure: ESOPHAGOGASTRODUODENOSCOPY (EGD) WITH PROPOFOL;  Surgeon: Lemar Lofty., MD;  Location: [facility];  Service: Gastroenterology;  Laterality: N/A;   EUS N/A 01/25/2022   Procedure: UPPER ENDOSCOPIC ULTRASOUND (EUS) LINEAR;  Surgeon: Lemar Lofty., MD;  Location: [facility];  Service: Gastroenterology;  Laterality: N/A;   FOOT NEUROMA SURGERY  1985   WISDOM TOOTH EXTRACTION     Patient Active Problem List   Diagnosis Date Noted   Vertigo 03/10/2023   Right upper quadrant abdominal mass 01/26/2023   Panic attacks 10/26/2022   Allergic reaction to insect bite 06/23/2022   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    Severely underweight adult 09/07/2021   Acute diarrhea 04/27/2021   Inflamed sebaceous cyst 02/11/2021   Transient disorientation 10/20/2020   Memory change 08/05/2020   Family history of early CAD 02/27/2020   Tremor 02/27/2020   Aortic atherosclerosis (HCC) 12/19/2019   Urinary urgency 05/21/2019   Bladder irritation 05/21/2019   External hemorrhoid 05/21/2019   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   Raynaud's syndrome 02/09/2019   Osteoarthritis of thumb, 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   Fatigue 12/19/2017   Acute left-sided low back pain without sciatica 12/19/2017   SVT (supraventricular tachycardia) (HCC) 08/31/2017   Peripheral neuropathy 08/31/2017   Dry eyes 09/21/2015   Atopic dermatitis 03/13/2015   IBS (irritable bowel syndrome) 03/18/2014   Osteoporosis 03/18/2014   Major depressive disorder, recurrent episode, moderate (HCC) 03/18/2014   Hypothyroidism 12/14/2013    PCP: Excell Seltzer, MD  REFERRING PROVIDER: Sherre Scarlet, MD  REFERRING DIAG: M79.18 (ICD-10-CM) - Myalgia, other site G58.8 (ICD-10-CM) - Other specified mononeuropathies  THERAPY DIAG:  Other muscle spasm  Pain in left hip  Muscle weakness (generalized)  Other low back pain  Pelvic pain  Abnormal posture  Unspecified lack of coordination  Rationale for Evaluation and Treatment: Rehabilitation  ONSET DATE: 3 years   SUBJECTIVE:  SUBJECTIVE STATEMENT: Pt has been working on her awareness of not going to the bathroom as often or every time she walks past a bathroom. She has been working on eating and drinking more. She states that she is feeling more positive and she feels better.   PAIN: 04/18/23 Are you having pain? Yes NPRS scale: 2/10 in hip; 2/10 abdomen Pain location:  Lt hip and perineum/vulva; lower abdomen  discomfort  Pain type: soreness Pain description: intermittent   Aggravating factors: sitting Relieving factors: lying flat, sitting on toilet  PRECAUTIONS: Other: large mass in Rt lower quadrant that has not been treated by medical team  RED FLAGS: None   WEIGHT BEARING RESTRICTIONS: No  FALLS:  Has patient fallen in last 6 months? No  LIVING ENVIRONMENT: Lives with: lives with their family Lives in: House/apartment  OCCUPATION: retired Runner, broadcasting/film/video  PLOF: Independent  PATIENT GOALS: decrease pain  PERTINENT HISTORY:  Interstitial cystitis   Updated 04/13/23:  BOWEL MOVEMENT: Pain with bowel movement: No Type of bowel movement:Frequency up to 5x/hour anywhere from solid to diarrhea; she will go 1-2 hours after eating on average and Strain No Fully empty rectum: No Leakage: Yes: seed size stool can come out without sensation when walking    Pads: No Fiber supplement: No  URINATION: Pain with urination: No Fully empty bladder: Yes: - Stream: Weak Urgency: No - but not drinking much and goes every time she walks by the bathroom  Frequency: as often as she can - very small amounts  Leakage:  none Pads: No  INTERCOURSE: NA  PREGNANCY: Vaginal deliveries 2 Tearing No C-section deliveries 0 Currently pregnant No  PROLAPSE: None   OBJECTIVE:  Note: Objective measures were completed at Evaluation unless otherwise noted. 04/13/23:               External Perineal Exam dryness, tenderness, labial fusion bil                             Internal Pelvic Floor Pt has increased bil pelvic floor muscle tenderness and tightness; bladder restriction and reproduction of urgency with palpation; reproduction of lower abdominal pain with palpation of tight anterior Rt pelvic floor muscles  Patient confirms identification and approves PT to assess internal pelvic floor and treatment Yes   PELVIC MMT:    MMT eval  Vaginal 2/5, 5 second hold, 5 repeat contractions  (Blank  rows = not tested)        TONE: High Bil  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, swelling of quadratus femoris muscle, tendinopathic and frayed hamstring origin Scheduled for anal manometry in April  2025   COGNITION: Overall cognitive status: Within functional limits for tasks assessed     SENSATION: Light touch: Appears intact Proprioception: Appears intact   FUNCTIONAL/SPECIAL TESTS:  Single leg stance:  Rt: more stable, some Lt drop - compensated trendelenburg  Lt: less stable, more Rt drop - compensated trendelenburg Squat: Lt weight shift, Rt valgus knee collapse FAIR: (+) Lt hip Hamstring length: significant decrease with pain on Lt, -75 degrees Curl-up test: breath holding, but no distortion, 2 finger width diastasis in upper abdominals Bed mobility: breath holding  Palpable transversus abdominus contraction bil, but weak Tenderness at Lt ischial tuberosity and hamstrings/Lt posterolateral hip mm  GAIT: Comments: WNL  POSTURE: forward head, decreased lumbar lordosis, posterior pelvic tilt, and Lt lower thoracic/upper lumbar curvature, elevated Lt iliac crest  PELVIC ALIGNMENT: Lt posterior rotation, Lt sacral facing rotation   LUMBARAROM/PROM:  A/PROM A/PROM  Eval (% available)  Flexion 75, pain on LT  Extension 75  Right lateral flexion 100  Left  lateral flexion 100, pain in back and Lt pelvis  Right rotation 75  Left rotation 50, feels good on back   (Blank rows = not tested)   LOWER EXTREMITY MMT:  MMT Right eval Left eval  Hip abduction 5/5 5/5  Hip adduction    Hip internal rotation 3/5 4-/5  Hip external rotation 3/5 4/5   PALPATION:   General: Large mass located in Rt abdomen lateral to umbilicus, non-tender, but with pressure it caused discomfort in low back - patient had not noticed before and reports no doctor has mentioned to her before    TODAY'S TREATMENT:                                                                                                                              DATE:  04/18/23 Manual: Bowel mobilization Abdominal soft tissue mobilization/myofascial release to bil lower quadrant Neuromuscular re-education: Diaphragmatic breathing  Modified happy baby 10 breaths Butterfly 10 breaths  Child's pose 10 breaths Cat cow 2 x 10 Vagal toning: Arm flapping Big hip circles both directions Facial muscle distortion Jumping Humming/buzzing   04/13/23 RE-EVAL Manual: Pt provides verbal consent for internal vaginal/rectal pelvic floor exam. Internal vaginal pelvic floor muscle reassessment Therapeutic activities: Urge drill Voiding schedule - avoiding just in case urinating Increasing food and water intake to help feel better overall Role on anxiety in pelvic floor muscle dysfunction   02/06/23 Manual: Pt provides verbal consent for internal vaginal/rectal pelvic floor exam. Internal pelvic floor muscle release to Lt deep pelvic floor muscles Trigger point release to Lt deep hip rotators externally Neuromuscular re-education: Transversus abdominus training with multimodal cues for improved motor control and breath coordination  Transversus abdominus isometrics 10x Supine unilateral clam shell red band 10x bil     PATIENT EDUCATION:  Education details: See above Person educated:  Patient Education method: Explanation, Demonstration, Tactile cues, Verbal cues, and Handouts Education comprehension: verbalized understanding  HOME EXERCISE  PROGRAM: [email or web address]  ASSESSMENT:  CLINICAL IMPRESSION: Pt is doing better this week; believe eating and drinking more is giving her more energy and allowing her to feel more positive overall. We focused on bowel and abdominal muscle release today. She had some tightness and discomfort in this area that she did very well working through. We performed down training and vagal toning exercises, discussing the improtance of mindfulness in these poses, not just being there for stretch. We also performed vagal toning activities to help further down training of nervous system. She was instructed to perform regular body scan as well. We went over working towards parasympathetic activation vs sympathetic. She will continue to benefit from skilled PT intervention in order to decrease pelvic pain, improve low back/Lt hip pain,decrease urinary urgency, decrease lower abdominal pain, and begin/progress functional strengthening program.   OBJECTIVE IMPAIRMENTS: decreased activity tolerance, decreased coordination, decreased endurance, decreased mobility, decreased strength, increased fascial restrictions, increased muscle spasms, impaired tone, postural dysfunction, and pain.   ACTIVITY LIMITATIONS: lifting, bending, sitting, squatting, and bed mobility  PARTICIPATION LIMITATIONS: cleaning, driving, and community activity  PERSONAL FACTORS: 1 comorbidity: medical history  are also affecting patient's functional outcome.   REHAB POTENTIAL: Good  CLINICAL DECISION MAKING: Evolving/moderate complexity  EVALUATION COMPLEXITY: Moderate   GOALS: Goals reviewed with patient? Yes  SHORT TERM GOALS: Target date: 02/21/23 - updated 04/13/23  Pt will be independent with HEP.   Baseline: Goal status: MET 04/13/23  2.  Pt will be able to teach back and  utilize urge suppression technique in order to help reduce number of trips to the bathroom.    Baseline:  Goal status: IN PROGRESS 04/13/23  3.  Pt will begin use of pelvic floor muscle wand on regular basis (2-3x/week) in order to help reduce pelvic floor muscle tension and pain. Baseline:  Goal status: IN PROGRESS 04/13/23  4.  Pt will be independent with diaphragmatic breathing and down training activities in order to improve pelvic floor relaxation.  Baseline:  Goal status: IN PROGRESS 04/13/23  5.  Pt will report pelvic pain no greater than 4/10. Baseline:  Goal status: IN PROGRESS 04/13/23   LONG TERM GOALS: Target date: 03/21/23  Pt will be independent with advanced HEP.   Baseline:  Goal status: IN PROGRESS 04/13/23  2.  Pt will be able to go 2-3 hours in between voids without urgency or incontinence in order to improve QOL and perform all functional activities with less difficulty.   Baseline:  Goal status: IN PROGRESS 04/13/23  3.  Pt will report pelvic pain no greater than 2/10.  Baseline:  Goal status: IN PROGRESS 04/13/23  4.  Pt will be able to sit for >30 minutes without any increased pelvic pain.  Baseline:  Goal status: IN PROGRESS 04/13/23  5.  Pt will be able to stand on single leg without pelvic drop and squat without weight shift in order to demonstrate improved functional core and hip strength to more appropriately stabilize pelvis.  Baseline:  Goal status: IN PROGRESS 04/13/23  6.  Pt will demonstrate increase in all impaired hip strength by 1 muscle grades in order to demonstrate improved lumbopelvic support and increase functional ability.   Baseline:  Goal status: IN PROGRESS 04/13/23  PLAN:  PT FREQUENCY: 1-2x/week  PT DURATION: 12 weeks  PLANNED INTERVENTIONS: 97110-Therapeutic exercises, 97530- Therapeutic activity, 97112- Neuromuscular re-education, 97535- Self Care, [id number]- Manual therapy, Dry Needling, and Biofeedback  PLAN FOR NEXT SESSION: test  hamstring strength, internal pelvic  floor muscle exam and treatment to tolerance   Julio Alm, PT, DPT04/08/2509:46 AM

## 2023-04-20 ENCOUNTER — Ambulatory Visit: Payer: Medicare Other

## 2023-04-20 DIAGNOSIS — R102 Pelvic and perineal pain: Secondary | ICD-10-CM

## 2023-04-20 DIAGNOSIS — M25552 Pain in left hip: Secondary | ICD-10-CM

## 2023-04-20 DIAGNOSIS — R293 Abnormal posture: Secondary | ICD-10-CM

## 2023-04-20 DIAGNOSIS — M5459 Other low back pain: Secondary | ICD-10-CM

## 2023-04-20 DIAGNOSIS — M62838 Other muscle spasm: Secondary | ICD-10-CM

## 2023-04-20 DIAGNOSIS — R279 Unspecified lack of coordination: Secondary | ICD-10-CM

## 2023-04-20 DIAGNOSIS — M6281 Muscle weakness (generalized): Secondary | ICD-10-CM

## 2023-04-20 NOTE — Therapy (Signed)
 OUTPATIENT PHYSICAL THERAPY FEMALE PELVIC TREATMENT   Patient Name: Jessica Miles MRN: [id number] DOB:1945-12-29, 78 y.o., female Today's Date: 04/20/2023  END OF SESSION:  PT End of Session - 04/20/23 1135     Visit Number 14    Date for PT Re-Evaluation 05/13/23    Authorization Type Medicare - needs Kx at 15    Progress Note Due on Visit 20    PT Start Time 1135    PT Stop Time 1225    PT Time Calculation (min) 50 min    Activity Tolerance Patient tolerated treatment well    Behavior During Therapy [facility] for tasks assessed/performed              Past Medical History:  Diagnosis Date   Allergy    Anxiety    COVID-19 virus infection    09-23-2019- 9-16 had monoclonal infusion    Depression    History of IBS    HPV (human papilloma virus) infection    Hyperlipidemia    82- not taking statin    Loss of taste    Neuropathy 2015   surgery for Mortons neuroma in 1985   Osteopenia    Osteoporosis    Raynaud's disease 02/2020   Tachycardia    Thyroid disease    Past Surgical History:  Procedure Laterality Date   BIOPSY  01/25/2022   Procedure: BIOPSY;  Surgeon: Lemar Lofty., MD;  Location: [facility];  Service: Gastroenterology;;   COLONOSCOPY  2019   hems only    ESOPHAGOGASTRODUODENOSCOPY (EGD) WITH PROPOFOL N/A 01/25/2022   Procedure: ESOPHAGOGASTRODUODENOSCOPY (EGD) WITH PROPOFOL;  Surgeon: Lemar Lofty., MD;  Location: [facility];  Service: Gastroenterology;  Laterality: N/A;   EUS N/A 01/25/2022   Procedure: UPPER ENDOSCOPIC ULTRASOUND (EUS) LINEAR;  Surgeon: Lemar Lofty., MD;  Location: [facility];  Service: Gastroenterology;  Laterality: N/A;   FOOT NEUROMA SURGERY  1985   WISDOM TOOTH EXTRACTION     Patient Active Problem List   Diagnosis Date Noted   Vertigo 03/10/2023   Right upper quadrant abdominal mass 01/26/2023   Panic attacks 10/26/2022   Allergic reaction to insect bite 06/23/2022   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    Severely underweight adult 09/07/2021   Acute diarrhea 04/27/2021   Inflamed sebaceous cyst 02/11/2021   Transient disorientation 10/20/2020   Memory change 08/05/2020   Family history of early CAD 02/27/2020   Tremor 02/27/2020   Aortic atherosclerosis (HCC) 12/19/2019   Urinary urgency 05/21/2019   Bladder irritation 05/21/2019   External hemorrhoid 05/21/2019   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   Raynaud's syndrome 02/09/2019   Osteoarthritis of thumb, 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   Fatigue 12/19/2017   Acute left-sided low back pain without sciatica 12/19/2017   SVT (supraventricular tachycardia) (HCC) 08/31/2017   Peripheral neuropathy 08/31/2017   Dry eyes 09/21/2015   Atopic dermatitis 03/13/2015   IBS (irritable bowel syndrome) 03/18/2014   Osteoporosis 03/18/2014   Major depressive disorder, recurrent episode, moderate (HCC) 03/18/2014   Hypothyroidism 12/14/2013    PCP: Excell Seltzer, MD  REFERRING PROVIDER: Sherre Scarlet, MD  REFERRING DIAG: M79.18 (ICD-10-CM) - Myalgia, other site G58.8 (ICD-10-CM) - Other specified mononeuropathies  THERAPY DIAG:  Other muscle spasm  Pain in left hip  Muscle weakness (generalized)  Other low back pain  Pelvic pain  Abnormal posture  Unspecified lack of coordination  Rationale for Evaluation and Treatment: Rehabilitation  ONSET DATE: 3 years   SUBJECTIVE:  SUBJECTIVE STATEMENT: Pt states that she has been working on vagal toning exercises and feels good about them.She is working on eating more and reducing anxiety more around eating. She is working on reducing number of trips to the bathroom. She feels like sensation shifts when she does not use the bathroom right away. She feels like the waves of cold that come around core are related to  neuropathy - gets worse with standing up and exercise.   PAIN: 04/20/23 Are you having pain? Yes NPRS scale:  Pain location:  Lt hip and perineum/vulva; lower abdomen discomfort  Pain type: soreness Pain description: intermittent   Aggravating factors: sitting Relieving factors: lying flat, sitting on toilet  PRECAUTIONS: Other: large mass in Rt lower quadrant that has not been treated by medical team  RED FLAGS: None   WEIGHT BEARING RESTRICTIONS: No  FALLS:  Has patient fallen in last 6 months? No  LIVING ENVIRONMENT: Lives with: lives with their family Lives in: House/apartment  OCCUPATION: retired Runner, broadcasting/film/video  PLOF: Independent  PATIENT GOALS: decrease pain  PERTINENT HISTORY:  Interstitial cystitis   Updated 04/13/23:  BOWEL MOVEMENT: Pain with bowel movement: No Type of bowel movement:Frequency up to 5x/hour anywhere from solid to diarrhea; she will go 1-2 hours after eating on average and Strain No Fully empty rectum: No Leakage: Yes: seed size stool can come out without sensation when walking    Pads: No Fiber supplement: No  URINATION: Pain with urination: No Fully empty bladder: Yes: - Stream: Weak Urgency: No - but not drinking much and goes every time she walks by the bathroom  Frequency: as often as she can - very small amounts  Leakage:  none Pads: No  INTERCOURSE: NA  PREGNANCY: Vaginal deliveries 2 Tearing No C-section deliveries 0 Currently pregnant No  PROLAPSE: None   OBJECTIVE:  Note: Objective measures were completed at Evaluation unless otherwise noted. 04/13/23:               External Perineal Exam dryness, tenderness, labial fusion bil                             Internal Pelvic Floor Pt has increased bil pelvic floor muscle tenderness and tightness; bladder restriction and reproduction of urgency with palpation; reproduction of lower abdominal pain with palpation of tight anterior Rt pelvic floor muscles  Patient confirms  identification and approves PT to assess internal pelvic floor and treatment Yes   PELVIC MMT:    MMT 04/13/23  Vaginal 2/5, 5 second hold, 5 repeat contractions  (Blank rows = not tested)        TONE: High Bil  01/26/23:               External Perineal Exam dryness, tenderness, labial fusion bil                             Internal Pelvic Floor significant superficial tenderness with report of burning, pinching and stinging; muscle spasm and aching in Lt deep pelvic floor muscle   Patient confirms identification and approves PT to assess internal pelvic floor and treatment Yes   PELVIC MMT:    MMT eval  Vaginal 1/5, 4 second hold, 6 repeat contractions  (Blank rows = not tested)        TONE: High Lt side  PROLAPSE: Not tested   01/24/23: DIAGNOSTIC FINDINGS:  CT Lt  hip: Mild OA, 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, swelling of quadratus femoris muscle, tendinopathic and frayed hamstring origin Scheduled for anal manometry in April 2025   COGNITION: Overall cognitive status: Within functional limits for tasks assessed     SENSATION: Light touch: Appears intact Proprioception: Appears intact   FUNCTIONAL/SPECIAL TESTS:  Single leg stance:  Rt: more stable, some Lt drop - compensated trendelenburg  Lt: less stable, more Rt drop - compensated trendelenburg Squat: Lt weight shift, Rt valgus knee collapse FAIR: (+) Lt hip Hamstring length: significant decrease with pain on Lt, -75 degrees Curl-up test: breath holding, but no distortion, 2 finger width diastasis in upper abdominals Bed mobility: breath holding  Palpable transversus abdominus contraction bil, but weak Tenderness at Lt ischial tuberosity and hamstrings/Lt posterolateral hip mm  GAIT: Comments: WNL  POSTURE: forward head, decreased lumbar lordosis, posterior pelvic tilt, and Lt lower thoracic/upper lumbar curvature, elevated Lt iliac crest  PELVIC ALIGNMENT: Lt posterior  rotation, Lt sacral facing rotation   LUMBARAROM/PROM:  A/PROM A/PROM  Eval (% available)  Flexion 75, pain on LT  Extension 75  Right lateral flexion 100  Left lateral flexion 100, pain in back and Lt pelvis  Right rotation 75  Left rotation 50, feels good on back   (Blank rows = not tested)   LOWER EXTREMITY MMT:  MMT Right eval Left eval  Hip abduction 5/5 5/5  Hip adduction    Hip internal rotation 3/5 4-/5  Hip external rotation 3/5 4/5   PALPATION:   General: Large mass located in Rt abdomen lateral to umbilicus, non-tender, but with pressure it caused discomfort in low back - patient had not noticed before and reports no doctor has mentioned to her before    TODAY'S TREATMENT:                                                                                                                              DATE:  04/20/23 Manual: Bowel mobilization Abdominal soft tissue mobilization/myofascial release to bil lower quadrant Neuromuscular re-education: Swiss ball exercises: Seated circles 3 x 10 bil Lateral glides 2 x 10 Pelvic tilts 2 x 10 Heels up/toes up 2 x 10 Forward/backward arm swings 3 x 10 Roll outs in standing on table 10x  04/18/23 Manual: Bowel mobilization Abdominal soft tissue mobilization/myofascial release to bil lower quadrant Neuromuscular re-education: Diaphragmatic breathing  Modified happy baby 10 breaths Butterfly 10 breaths  Child's pose 10 breaths Cat cow 2 x 10 Vagal toning: Arm flapping Big hip circles both directions Facial muscle distortion Jumping Humming/buzzing   04/13/23 RE-EVAL Manual: Pt provides verbal consent for internal vaginal/rectal pelvic floor exam. Internal vaginal pelvic floor muscle reassessment Therapeutic activities: Urge drill Voiding schedule - avoiding just in case urinating Increasing food and water intake to help feel better overall Role on anxiety in pelvic floor muscle dysfunction   PATIENT  EDUCATION:  Education details: See above Person educated: Patient  Education method: Explanation, Demonstration, Tactile cues, Verbal cues, and Handouts Education comprehension: verbalized understanding  HOME EXERCISE PROGRAM: [email or web address]  ASSESSMENT:  CLINICAL IMPRESSION: Pt starting to feel much better. We talked about continuing with down training exercises in order to calm anxiety and restriction throughout body in addition to allowing her better pain management. She is doing very well with these activities and we progressed to swiss ball exercises today. She can perform these at home and list of exercises provided. We continued abdominal mobilization to help with discomfort and reduce restriction here. She will continue to benefit from skilled PT intervention in order to decrease pelvic pain, improve low back/Lt hip pain,decrease urinary urgency, decrease lower abdominal pain, and begin/progress functional strengthening program.   OBJECTIVE IMPAIRMENTS: decreased activity tolerance, decreased coordination, decreased endurance, decreased mobility, decreased strength, increased fascial restrictions, increased muscle spasms, impaired tone, postural dysfunction, and pain.   ACTIVITY LIMITATIONS: lifting, bending, sitting, squatting, and bed mobility  PARTICIPATION LIMITATIONS: cleaning, driving, and community activity  PERSONAL FACTORS: 1 comorbidity: medical history  are also affecting patient's functional outcome.   REHAB POTENTIAL: Good  CLINICAL DECISION MAKING: Evolving/moderate complexity  EVALUATION COMPLEXITY: Moderate   GOALS: Goals reviewed with patient? Yes  SHORT TERM GOALS: Target date: 02/21/23 - updated 04/13/23  Pt will be independent with HEP.   Baseline: Goal status: MET 04/13/23  2.  Pt will be able to teach back and utilize urge suppression technique in order to help reduce number of trips to the bathroom.    Baseline:  Goal status: IN PROGRESS 04/13/23  3.  Pt  will begin use of pelvic floor muscle wand on regular basis (2-3x/week) in order to help reduce pelvic floor muscle tension and pain. Baseline:  Goal status: IN PROGRESS 04/13/23  4.  Pt will be independent with diaphragmatic breathing and down training activities in order to improve pelvic floor relaxation.  Baseline:  Goal status: IN PROGRESS 04/13/23  5.  Pt will report pelvic pain no greater than 4/10. Baseline:  Goal status: IN PROGRESS 04/13/23   LONG TERM GOALS: Target date: 03/21/23  Pt will be independent with advanced HEP.   Baseline:  Goal status: IN PROGRESS 04/13/23  2.  Pt will be able to go 2-3 hours in between voids without urgency or incontinence in order to improve QOL and perform all functional activities with less difficulty.   Baseline:  Goal status: IN PROGRESS 04/13/23  3.  Pt will report pelvic pain no greater than 2/10.  Baseline:  Goal status: IN PROGRESS 04/13/23  4.  Pt will be able to sit for >30 minutes without any increased pelvic pain.  Baseline:  Goal status: IN PROGRESS 04/13/23  5.  Pt will be able to stand on single leg without pelvic drop and squat without weight shift in order to demonstrate improved functional core and hip strength to more appropriately stabilize pelvis.  Baseline:  Goal status: IN PROGRESS 04/13/23  6.  Pt will demonstrate increase in all impaired hip strength by 1 muscle grades in order to demonstrate improved lumbopelvic support and increase functional ability.   Baseline:  Goal status: IN PROGRESS 04/13/23  PLAN:  PT FREQUENCY: 1-2x/week  PT DURATION: 12 weeks  PLANNED INTERVENTIONS: 97110-Therapeutic exercises, 97530- Therapeutic activity, 97112- Neuromuscular re-education, 97535- Self Care, [id number]- Manual therapy, Dry Needling, and Biofeedback  PLAN FOR NEXT SESSION: test hamstring strength, internal pelvic floor muscle exam and treatment to tolerance   Julio Alm, PT, DPT04/10/2509:36 AM

## 2023-04-20 NOTE — Patient Instructions (Signed)
 Swiss ball exercises: Seated circles 3 x 10 bil Lateral glides 2 x 10 Pelvic tilts 2 x 10 Heels up/toes up 2 x 10 Forward/backward arm swings 3 x 10 Roll outs in standing on table 10x     [facility] [address] [place], [place] [address] Phone # [phone] Fax [phone]

## 2023-04-25 ENCOUNTER — Telehealth: Payer: Self-pay

## 2023-04-25 ENCOUNTER — Ambulatory Visit: Payer: Medicare Other

## 2023-04-25 DIAGNOSIS — M5459 Other low back pain: Secondary | ICD-10-CM

## 2023-04-25 DIAGNOSIS — R102 Pelvic and perineal pain: Secondary | ICD-10-CM

## 2023-04-25 DIAGNOSIS — R279 Unspecified lack of coordination: Secondary | ICD-10-CM

## 2023-04-25 DIAGNOSIS — M25552 Pain in left hip: Secondary | ICD-10-CM

## 2023-04-25 DIAGNOSIS — M62838 Other muscle spasm: Secondary | ICD-10-CM | POA: Diagnosis not present

## 2023-04-25 DIAGNOSIS — M6281 Muscle weakness (generalized): Secondary | ICD-10-CM

## 2023-04-25 DIAGNOSIS — R293 Abnormal posture: Secondary | ICD-10-CM

## 2023-04-25 NOTE — Telephone Encounter (Signed)
 Patient used to buy a supplement from the [place] office that worked well. She would like to start taking it again. What was it called? And do we still have it available?

## 2023-04-25 NOTE — Therapy (Signed)
 OUTPATIENT PHYSICAL THERAPY FEMALE PELVIC TREATMENT   Patient Name: Jessica Miles MRN: [id number] DOB:March 16, 1945, 78 y.o., female Today's Date: 04/25/2023  END OF SESSION:  PT End of Session - 04/25/23 1109     Visit Number 15    Date for PT Re-Evaluation 05/13/23    Authorization Type Medicare - needs Kx at 15    Progress Note Due on Visit 20    PT Start Time 1100    PT Stop Time 1140    PT Time Calculation (min) 40 min    Activity Tolerance Patient tolerated treatment well    Behavior During Therapy [facility] for tasks assessed/performed              Past Medical History:  Diagnosis Date   Allergy    Anxiety    COVID-19 virus infection    09-23-2019- 9-16 had monoclonal infusion    Depression    History of IBS    HPV (human papilloma virus) infection    Hyperlipidemia    82- not taking statin    Loss of taste    Neuropathy 2015   surgery for Mortons neuroma in 1985   Osteopenia    Osteoporosis    Raynaud's disease 02/2020   Tachycardia    Thyroid disease    Past Surgical History:  Procedure Laterality Date   BIOPSY  01/25/2022   Procedure: BIOPSY;  Surgeon: Lemar Lofty., MD;  Location: [facility];  Service: Gastroenterology;;   COLONOSCOPY  2019   hems only    ESOPHAGOGASTRODUODENOSCOPY (EGD) WITH PROPOFOL N/A 01/25/2022   Procedure: ESOPHAGOGASTRODUODENOSCOPY (EGD) WITH PROPOFOL;  Surgeon: Lemar Lofty., MD;  Location: [facility];  Service: Gastroenterology;  Laterality: N/A;   EUS N/A 01/25/2022   Procedure: UPPER ENDOSCOPIC ULTRASOUND (EUS) LINEAR;  Surgeon: Lemar Lofty., MD;  Location: [facility];  Service: Gastroenterology;  Laterality: N/A;   FOOT NEUROMA SURGERY  1985   WISDOM TOOTH EXTRACTION     Patient Active Problem List   Diagnosis Date Noted   Vertigo 03/10/2023   Right upper quadrant abdominal mass 01/26/2023   Panic attacks 10/26/2022   Allergic reaction to insect bite 06/23/2022   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    Severely underweight adult 09/07/2021   Acute diarrhea 04/27/2021   Inflamed sebaceous cyst 02/11/2021   Transient disorientation 10/20/2020   Memory change 08/05/2020   Family history of early CAD 02/27/2020   Tremor 02/27/2020   Aortic atherosclerosis (HCC) 12/19/2019   Urinary urgency 05/21/2019   Bladder irritation 05/21/2019   External hemorrhoid 05/21/2019   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   Raynaud's syndrome 02/09/2019   Osteoarthritis of thumb, 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   Fatigue 12/19/2017   Acute left-sided low back pain without sciatica 12/19/2017   SVT (supraventricular tachycardia) (HCC) 08/31/2017   Peripheral neuropathy 08/31/2017   Dry eyes 09/21/2015   Atopic dermatitis 03/13/2015   IBS (irritable bowel syndrome) 03/18/2014   Osteoporosis 03/18/2014   Major depressive disorder, recurrent episode, moderate (HCC) 03/18/2014   Hypothyroidism 12/14/2013    PCP: Excell Seltzer, MD  REFERRING PROVIDER: Sherre Scarlet, MD  REFERRING DIAG: M79.18 (ICD-10-CM) - Myalgia, other site G58.8 (ICD-10-CM) - Other specified mononeuropathies  THERAPY DIAG:  Other muscle spasm  Pain in left hip  Muscle weakness (generalized)  Other low back pain  Pelvic pain  Abnormal posture  Unspecified lack of coordination  Rationale for Evaluation and Treatment: Rehabilitation  ONSET DATE: 3 years   SUBJECTIVE:  SUBJECTIVE STATEMENT: Pt has been doing mindfulness and states that she is doing better with it than she has in the past. She has continued to be eating more and feels better with more energy.  She feels like lower abdominal heaviness is separate from the waves of cold that are coming from her low back.   PAIN: 04/25/23 Are you having pain? Yes NPRS scale:  Pain location:  Lt hip and  perineum/vulva; lower abdomen discomfort  Pain type: soreness Pain description: intermittent   Aggravating factors: sitting Relieving factors: lying flat, sitting on toilet  PRECAUTIONS: Other: large mass in Rt lower quadrant that has not been treated by medical team  RED FLAGS: None   WEIGHT BEARING RESTRICTIONS: No  FALLS:  Has patient fallen in last 6 months? No  LIVING ENVIRONMENT: Lives with: lives with their family Lives in: House/apartment  OCCUPATION: retired Runner, broadcasting/film/video  PLOF: Independent  PATIENT GOALS: decrease pain  PERTINENT HISTORY:  Interstitial cystitis   Updated 04/13/23:  BOWEL MOVEMENT: Pain with bowel movement: No Type of bowel movement:Frequency up to 5x/hour anywhere from solid to diarrhea; she will go 1-2 hours after eating on average and Strain No Fully empty rectum: No Leakage: Yes: seed size stool can come out without sensation when walking    Pads: No Fiber supplement: No  URINATION: Pain with urination: No Fully empty bladder: Yes: - Stream: Weak Urgency: No - but not drinking much and goes every time she walks by the bathroom  Frequency: as often as she can - very small amounts  Leakage:  none Pads: No  INTERCOURSE: NA  PREGNANCY: Vaginal deliveries 2 Tearing No C-section deliveries 0 Currently pregnant No  PROLAPSE: None   OBJECTIVE:  Note: Objective measures were completed at Evaluation unless otherwise noted. 04/25/23: Grade 1 uterine ligament laxity Significant tenderness with palpation of cervix Paradoxical pelvic floor muscle contraction with attempting to bear down to look for anterior/posterior vaginal wall laxity (could be fear of releasing gas/urine)  04/13/23:               External Perineal Exam dryness, tenderness, labial fusion bil                             Internal Pelvic Floor Pt has increased bil pelvic floor muscle tenderness and tightness; bladder restriction and reproduction of urgency with palpation;  reproduction of lower abdominal pain with palpation of tight anterior Rt pelvic floor muscles  Patient confirms identification and approves PT to assess internal pelvic floor and treatment Yes   PELVIC MMT:    MMT 04/13/23  Vaginal 2/5, 5 second hold, 5 repeat contractions  (Blank rows = not tested)        TONE: High Bil  01/26/23:               External Perineal Exam dryness, tenderness, labial fusion bil                             Internal Pelvic Floor significant superficial tenderness with report of burning, pinching and stinging; muscle spasm and aching in Lt deep pelvic floor muscle   Patient confirms identification and approves PT to assess internal pelvic floor and treatment Yes   PELVIC MMT:    MMT eval  Vaginal 1/5, 4 second hold, 6 repeat contractions  (Blank rows = not tested)        TONE:  High Lt side  PROLAPSE: Not tested   01/24/23: 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, swelling of quadratus femoris muscle, tendinopathic and frayed hamstring origin Scheduled for anal manometry in April 2025   COGNITION: Overall cognitive status: Within functional limits for tasks assessed     SENSATION: Light touch: Appears intact Proprioception: Appears intact   FUNCTIONAL/SPECIAL TESTS:  Single leg stance:  Rt: more stable, some Lt drop - compensated trendelenburg  Lt: less stable, more Rt drop - compensated trendelenburg Squat: Lt weight shift, Rt valgus knee collapse FAIR: (+) Lt hip Hamstring length: significant decrease with pain on Lt, -75 degrees Curl-up test: breath holding, but no distortion, 2 finger width diastasis in upper abdominals Bed mobility: breath holding  Palpable transversus abdominus contraction bil, but weak Tenderness at Lt ischial tuberosity and hamstrings/Lt posterolateral hip mm  GAIT: Comments: WNL  POSTURE: forward head, decreased lumbar lordosis, posterior  pelvic tilt, and Lt lower thoracic/upper lumbar curvature, elevated Lt iliac crest  PELVIC ALIGNMENT: Lt posterior rotation, Lt sacral facing rotation   LUMBARAROM/PROM:  A/PROM A/PROM  Eval (% available)  Flexion 75, pain on LT  Extension 75  Right lateral flexion 100  Left lateral flexion 100, pain in back and Lt pelvis  Right rotation 75  Left rotation 50, feels good on back   (Blank rows = not tested)   LOWER EXTREMITY MMT:  MMT Right eval Left eval  Hip abduction 5/5 5/5  Hip adduction    Hip internal rotation 3/5 4-/5  Hip external rotation 3/5 4/5   PALPATION:   General: Large mass located in Rt abdomen lateral to umbilicus, non-tender, but with pressure it caused discomfort in low back - patient had not noticed before and reports no doctor has mentioned to her before    TODAY'S TREATMENT:                                                                                                                              DATE:  04/25/23 Manual: Pt provides verbal consent for internal vaginal/rectal pelvic floor exam. Internal vaginal pelvic floor muscle exam  Bil levator ani release  Bladder mobilization bil Cervix mobilization Therapeutic activities: Inverted lying position to help decrease abdominal pressure    04/20/23 Manual: Bowel mobilization Abdominal soft tissue mobilization/myofascial release to bil lower quadrant Neuromuscular re-education: Swiss ball exercises: Seated circles 3 x 10 bil Lateral glides 2 x 10 Pelvic tilts 2 x 10 Heels up/toes up 2 x 10 Forward/backward arm swings 3 x 10 Roll outs in standing on table 10x  04/18/23 Manual: Bowel mobilization Abdominal soft tissue mobilization/myofascial release to bil lower quadrant Neuromuscular re-education: Diaphragmatic breathing  Modified happy baby 10 breaths Butterfly 10 breaths  Child's pose 10 breaths Cat cow 2 x 10 Vagal toning: Arm flapping Big hip circles both directions Facial  muscle distortion Jumping Humming/buzzing     PATIENT EDUCATION:  Education details: See above Person educated: Patient Education method: Explanation, Demonstration, Tactile cues, Verbal cues, and Handouts Education comprehension: verbalized understanding  HOME EXERCISE PROGRAM: [email or web address]  ASSESSMENT:  CLINICAL IMPRESSION: Pt continuing to feel better as she eats more, performs down training exercises, and is working on mindfulness in her body. She has been able to determine that abdominal discomfort is heavy and different from the waves of cold that feel like they're coming from her low back. We found anterior levator ani restriction with internal vaginal exam that reproduced discomfort in the area that should would normally feel discomfort in her abdomen. She also had significant discomfort with any contact of cervix during internal techniques, which was also sitting lower than was expected. However, due to paradoxical bearing down, we were not able to determine if there was any change in cervix/uterine position or anterior/posterior vaginal wall laxity. She was encouraged to keep an eye on symptoms over the next couple of days to help determine if release of restriction today helped improve at all. She will continue to benefit from skilled PT intervention in order to decrease pelvic pain, improve low back/Lt hip pain,decrease urinary urgency, decrease lower abdominal pain, and begin/progress functional strengthening program.   OBJECTIVE IMPAIRMENTS: decreased activity tolerance, decreased coordination, decreased endurance, decreased mobility, decreased strength, increased fascial restrictions, increased muscle spasms, impaired tone, postural dysfunction, and pain.   ACTIVITY LIMITATIONS: lifting, bending, sitting, squatting, and bed mobility  PARTICIPATION LIMITATIONS: cleaning, driving, and community activity  PERSONAL FACTORS: 1 comorbidity: medical history  are also affecting  patient's functional outcome.   REHAB POTENTIAL: Good  CLINICAL DECISION MAKING: Evolving/moderate complexity  EVALUATION COMPLEXITY: Moderate   GOALS: Goals reviewed with patient? Yes  SHORT TERM GOALS: Target date: 02/21/23 - updated 04/13/23  Pt will be independent with HEP.   Baseline: Goal status: MET 04/13/23  2.  Pt will be able to teach back and utilize urge suppression technique in order to help reduce number of trips to the bathroom.    Baseline:  Goal status: IN PROGRESS 04/13/23  3.  Pt will begin use of pelvic floor muscle wand on regular basis (2-3x/week) in order to help reduce pelvic floor muscle tension and pain. Baseline:  Goal status: IN PROGRESS 04/13/23  4.  Pt will be independent with diaphragmatic breathing and down training activities in order to improve pelvic floor relaxation.  Baseline:  Goal status: IN PROGRESS 04/13/23  5.  Pt will report pelvic pain no greater than 4/10. Baseline:  Goal status: IN PROGRESS 04/13/23   LONG TERM GOALS: Target date: 03/21/23  Pt will be independent with advanced HEP.   Baseline:  Goal status: IN PROGRESS 04/13/23  2.  Pt will be able to go 2-3 hours in between voids without urgency or incontinence in order to improve QOL and perform all functional activities with less difficulty.   Baseline:  Goal status: IN PROGRESS 04/13/23  3.  Pt will report pelvic pain no greater than 2/10.  Baseline:  Goal status: IN PROGRESS 04/13/23  4.  Pt will be able to sit for >30 minutes without any increased pelvic pain.  Baseline:  Goal status: IN PROGRESS 04/13/23  5.  Pt will be able to stand on single leg without pelvic drop and squat without weight shift in order to demonstrate improved functional core and hip strength to more appropriately stabilize pelvis.  Baseline:  Goal status: IN PROGRESS 04/13/23  6.  Pt will demonstrate increase in all impaired hip strength by  1 muscle grades in order to demonstrate improved lumbopelvic  support and increase functional ability.   Baseline:  Goal status: IN PROGRESS 04/13/23  PLAN:  PT FREQUENCY: 1-2x/week  PT DURATION: 12 weeks  PLANNED INTERVENTIONS: 97110-Therapeutic exercises, 97530- Therapeutic activity, 97112- Neuromuscular re-education, 97535- Self Care, [id number]- Manual therapy, Dry Needling, and Biofeedback  PLAN FOR NEXT SESSION: test hamstring strength, internal pelvic floor muscle exam and treatment to tolerance   Verlena Glenn, PT, DPT04/15/2511:10 AM

## 2023-04-26 NOTE — Telephone Encounter (Signed)
 I called the patient and she was appreciative of the call and let me know that she had stopped by the office to get supplements and was told by the staff that we no longer carry the supplement that the office and she will look online or at the store. Nothing further needed.

## 2023-04-27 ENCOUNTER — Ambulatory Visit: Payer: Medicare Other

## 2023-04-27 DIAGNOSIS — M6281 Muscle weakness (generalized): Secondary | ICD-10-CM

## 2023-04-27 DIAGNOSIS — R102 Pelvic and perineal pain: Secondary | ICD-10-CM

## 2023-04-27 DIAGNOSIS — M25552 Pain in left hip: Secondary | ICD-10-CM

## 2023-04-27 DIAGNOSIS — M62838 Other muscle spasm: Secondary | ICD-10-CM | POA: Diagnosis not present

## 2023-04-27 DIAGNOSIS — R279 Unspecified lack of coordination: Secondary | ICD-10-CM

## 2023-04-27 DIAGNOSIS — M5459 Other low back pain: Secondary | ICD-10-CM

## 2023-04-27 DIAGNOSIS — R293 Abnormal posture: Secondary | ICD-10-CM

## 2023-04-27 NOTE — Therapy (Signed)
 OUTPATIENT PHYSICAL THERAPY FEMALE PELVIC TREATMENT   Patient Name: Jessica Miles MRN: [id number] DOB:[birth year], 78 y.o., female Today's Date: 04/27/2023  END OF SESSION:  PT End of Session - 04/27/23 1101     Visit Number 16    Date for PT Re-Evaluation 05/13/23    Authorization Type Medicare - needs Kx at 15    Progress Note Due on Visit 20    PT Start Time 1100    PT Stop Time 1140    PT Time Calculation (min) 40 min    Activity Tolerance Patient tolerated treatment well    Behavior During Therapy [facility] for tasks assessed/performed              Past Medical History:  Diagnosis Date   Allergy    Anxiety    COVID-19 virus infection    09-23-2019- 9-16 had monoclonal infusion    Depression    History of IBS    HPV (human papilloma virus) infection    Hyperlipidemia    82- not taking statin    Loss of taste    Neuropathy 2015   surgery for Mortons neuroma in 1985   Osteopenia    Osteoporosis    Raynaud's disease 02/2020   Tachycardia    Thyroid disease    Past Surgical History:  Procedure Laterality Date   BIOPSY  01/25/2022   Procedure: BIOPSY;  Surgeon: Lemar Lofty., MD;  Location: [facility];  Service: Gastroenterology;;   COLONOSCOPY  2019   hems only    ESOPHAGOGASTRODUODENOSCOPY (EGD) WITH PROPOFOL N/A 01/25/2022   Procedure: ESOPHAGOGASTRODUODENOSCOPY (EGD) WITH PROPOFOL;  Surgeon: Lemar Lofty., MD;  Location: [facility];  Service: Gastroenterology;  Laterality: N/A;   EUS N/A 01/25/2022   Procedure: UPPER ENDOSCOPIC ULTRASOUND (EUS) LINEAR;  Surgeon: Lemar Lofty., MD;  Location: [facility];  Service: Gastroenterology;  Laterality: N/A;   FOOT NEUROMA SURGERY  1985   WISDOM TOOTH EXTRACTION     Patient Active Problem List   Diagnosis Date Noted   Vertigo 03/10/2023   Right upper quadrant abdominal mass 01/26/2023   Panic attacks 10/26/2022   Allergic reaction to insect bite 06/23/2022   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    Severely underweight adult 09/07/2021   Acute diarrhea 04/27/2021   Inflamed sebaceous cyst 02/11/2021   Transient disorientation 10/20/2020   Memory change 08/05/2020   Family history of early CAD 02/27/2020   Tremor 02/27/2020   Aortic atherosclerosis (HCC) 12/19/2019   Urinary urgency 05/21/2019   Bladder irritation 05/21/2019   External hemorrhoid 05/21/2019   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   Raynaud's syndrome 02/09/2019   Osteoarthritis of thumb, 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   Fatigue 12/19/2017   Acute left-sided low back pain without sciatica 12/19/2017   SVT (supraventricular tachycardia) (HCC) 08/31/2017   Peripheral neuropathy 08/31/2017   Dry eyes 09/21/2015   Atopic dermatitis 03/13/2015   IBS (irritable bowel syndrome) 03/18/2014   Osteoporosis 03/18/2014   Major depressive disorder, recurrent episode, moderate (HCC) 03/18/2014   Hypothyroidism 12/14/2013    PCP: Excell Seltzer, MD  REFERRING PROVIDER: Sherre Scarlet, MD  REFERRING DIAG: M79.18 (ICD-10-CM) - Myalgia, other site G58.8 (ICD-10-CM) - Other specified mononeuropathies  THERAPY DIAG:  Other muscle spasm  Pain in left hip  Muscle weakness (generalized)  Other low back pain  Pelvic pain  Abnormal posture  Unspecified lack of coordination  Rationale for Evaluation and Treatment: Rehabilitation  ONSET DATE: 3 years   SUBJECTIVE:  SUBJECTIVE STATEMENT: She is eating 2x/day. Within a half hour of having coffee in the morning, she has a good bowel movement that she would rate as a 4 on the bristol stool scale. But then afterwards she has an awareness of more stool that needs to come out that are like two small pebbles. This can continue to happen throughout the day. She doesn't feel like her lower abdominal discomfort  was any different after last session doing internal work.   PAIN: 04/25/23 Are you having pain? Yes NPRS scale: 1.5/10 Pain location:  Lt hip and perineum/vulva; lower abdomen discomfort  Pain type: soreness Pain description: intermittent   Aggravating factors: sitting Relieving factors: lying flat, sitting on toilet  PRECAUTIONS: Other: large mass in Rt lower quadrant that has not been treated by medical team  RED FLAGS: None   WEIGHT BEARING RESTRICTIONS: No  FALLS:  Has patient fallen in last 6 months? No  LIVING ENVIRONMENT: Lives with: lives with their family Lives in: House/apartment  OCCUPATION: retired Runner, broadcasting/film/video  PLOF: Independent  PATIENT GOALS: decrease pain  PERTINENT HISTORY:  Interstitial cystitis   Updated 04/13/23:  BOWEL MOVEMENT: Pain with bowel movement: No Type of bowel movement:Frequency up to 5x/hour anywhere from solid to diarrhea; she will go 1-2 hours after eating on average and Strain No Fully empty rectum: No Leakage: Yes: seed size stool can come out without sensation when walking    Pads: No Fiber supplement: No  URINATION: Pain with urination: No Fully empty bladder: Yes: - Stream: Weak Urgency: No - but not drinking much and goes every time she walks by the bathroom  Frequency: as often as she can - very small amounts  Leakage:  none Pads: No  INTERCOURSE: NA  PREGNANCY: Vaginal deliveries 2 Tearing No C-section deliveries 0 Currently pregnant No  PROLAPSE: None   OBJECTIVE:  Note: Objective measures were completed at Evaluation unless otherwise noted. 04/25/23: Grade 1 uterine ligament laxity Significant tenderness with palpation of cervix Paradoxical pelvic floor muscle contraction with attempting to bear down to look for anterior/posterior vaginal wall laxity (could be fear of releasing gas/urine)  04/13/23:               External Perineal Exam dryness, tenderness, labial fusion bil                              Internal Pelvic Floor Pt has increased bil pelvic floor muscle tenderness and tightness; bladder restriction and reproduction of urgency with palpation; reproduction of lower abdominal pain with palpation of tight anterior Rt pelvic floor muscles  Patient confirms identification and approves PT to assess internal pelvic floor and treatment Yes   PELVIC MMT:    MMT 04/13/23  Vaginal 2/5, 5 second hold, 5 repeat contractions  (Blank rows = not tested)        TONE: High Bil  01/26/23:               External Perineal Exam dryness, tenderness, labial fusion bil                             Internal Pelvic Floor significant superficial tenderness with report of burning, pinching and stinging; muscle spasm and aching in Lt deep pelvic floor muscle   Patient confirms identification and approves PT to assess internal pelvic floor and treatment Yes   PELVIC MMT:    MMT  eval  Vaginal 1/5, 4 second hold, 6 repeat contractions  (Blank rows = not tested)        TONE: High Lt side  PROLAPSE: Not tested   01/24/23: 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, swelling of quadratus femoris muscle, tendinopathic and frayed hamstring origin Scheduled for anal manometry in April 2025   COGNITION: Overall cognitive status: Within functional limits for tasks assessed     SENSATION: Light touch: Appears intact Proprioception: Appears intact   FUNCTIONAL/SPECIAL TESTS:  Single leg stance:  Rt: more stable, some Lt drop - compensated trendelenburg  Lt: less stable, more Rt drop - compensated trendelenburg Squat: Lt weight shift, Rt valgus knee collapse FAIR: (+) Lt hip Hamstring length: significant decrease with pain on Lt, -75 degrees Curl-up test: breath holding, but no distortion, 2 finger width diastasis in upper abdominals Bed mobility: breath holding  Palpable transversus abdominus contraction bil, but weak Tenderness  at Lt ischial tuberosity and hamstrings/Lt posterolateral hip mm  GAIT: Comments: WNL  POSTURE: forward head, decreased lumbar lordosis, posterior pelvic tilt, and Lt lower thoracic/upper lumbar curvature, elevated Lt iliac crest  PELVIC ALIGNMENT: Lt posterior rotation, Lt sacral facing rotation   LUMBARAROM/PROM:  A/PROM A/PROM  Eval (% available)  Flexion 75, pain on LT  Extension 75  Right lateral flexion 100  Left lateral flexion 100, pain in back and Lt pelvis  Right rotation 75  Left rotation 50, feels good on back   (Blank rows = not tested)   LOWER EXTREMITY MMT:  MMT Right eval Left eval  Hip abduction 5/5 5/5  Hip adduction    Hip internal rotation 3/5 4-/5  Hip external rotation 3/5 4/5   PALPATION:   General: Large mass located in Rt abdomen lateral to umbilicus, non-tender, but with pressure it caused discomfort in low back - patient had not noticed before and reports no doctor has mentioned to her before    TODAY'S TREATMENT:                                                                                                                              DATE:  04/27/23 Exercises: Kneeling hip flexor stretch 2 min bil Modified thomas stretch 2 min bil Therapeutic activities: Bowel and bladder retraining Talked about habitual bowel habits and having created some hypersensitivity Bladder irritants    04/25/23 Manual: Pt provides verbal consent for internal vaginal/rectal pelvic floor exam. Internal vaginal pelvic floor muscle exam  Bil levator ani release  Bladder mobilization bil Cervix mobilization Therapeutic activities: Inverted lying position to help decrease abdominal pressure    04/20/23 Manual: Bowel mobilization Abdominal soft tissue mobilization/myofascial release to bil lower quadrant Neuromuscular re-education: Swiss ball exercises: Seated circles 3 x 10 bil Lateral glides 2 x 10 Pelvic tilts 2 x 10 Heels up/toes up 2 x  10 Forward/backward arm swings 3 x 10 Roll outs in standing  on table 10x     PATIENT EDUCATION:  Education details: See above Person educated: Patient Education method: Explanation, Demonstration, Tactile cues, Verbal cues, and Handouts Education comprehension: verbalized understanding  HOME EXERCISE PROGRAM: [email or web address]  ASSESSMENT:  CLINICAL IMPRESSION: Pt continues to see progress with bladder control and frequency. She is not noticing any improvements with lower abdominal discomfort yet, but bowel movements have not changed. Due to this, we discussed in detail bowel and bladder retraining in addition to bladder irritants and slow steady intake of water. Believe this may be very helpful with the hypersensitivity retraining and allow bulk to build up in bowel movements. If this does not work, that also give [place] more information. We performed anterior mobility stretches to help work on tightness/restriction in abdomen. She will continue to benefit from skilled PT intervention in order to decrease pelvic pain, improve low back/Lt hip pain,decrease urinary urgency, decrease lower abdominal pain, and begin/progress functional strengthening program.   OBJECTIVE IMPAIRMENTS: decreased activity tolerance, decreased coordination, decreased endurance, decreased mobility, decreased strength, increased fascial restrictions, increased muscle spasms, impaired tone, postural dysfunction, and pain.   ACTIVITY LIMITATIONS: lifting, bending, sitting, squatting, and bed mobility  PARTICIPATION LIMITATIONS: cleaning, driving, and community activity  PERSONAL FACTORS: 1 comorbidity: medical history  are also affecting patient's functional outcome.   REHAB POTENTIAL: Good  CLINICAL DECISION MAKING: Evolving/moderate complexity  EVALUATION COMPLEXITY: Moderate   GOALS: Goals reviewed with patient? Yes  SHORT TERM GOALS: Target date: 02/21/23 - updated 04/13/23  Pt will be independent with HEP.    Baseline: Goal status: MET 04/13/23  2.  Pt will be able to teach back and utilize urge suppression technique in order to help reduce number of trips to the bathroom.    Baseline:  Goal status: IN PROGRESS 04/13/23  3.  Pt will begin use of pelvic floor muscle wand on regular basis (2-3x/week) in order to help reduce pelvic floor muscle tension and pain. Baseline:  Goal status: IN PROGRESS 04/13/23  4.  Pt will be independent with diaphragmatic breathing and down training activities in order to improve pelvic floor relaxation.  Baseline:  Goal status: IN PROGRESS 04/13/23  5.  Pt will report pelvic pain no greater than 4/10. Baseline:  Goal status: IN PROGRESS 04/13/23   LONG TERM GOALS: Target date: 03/21/23  Pt will be independent with advanced HEP.   Baseline:  Goal status: IN PROGRESS 04/13/23  2.  Pt will be able to go 2-3 hours in between voids without urgency or incontinence in order to improve QOL and perform all functional activities with less difficulty.   Baseline:  Goal status: IN PROGRESS 04/13/23  3.  Pt will report pelvic pain no greater than 2/10.  Baseline:  Goal status: IN PROGRESS 04/13/23  4.  Pt will be able to sit for >30 minutes without any increased pelvic pain.  Baseline:  Goal status: IN PROGRESS 04/13/23  5.  Pt will be able to stand on single leg without pelvic drop and squat without weight shift in order to demonstrate improved functional core and hip strength to more appropriately stabilize pelvis.  Baseline:  Goal status: IN PROGRESS 04/13/23  6.  Pt will demonstrate increase in all impaired hip strength by 1 muscle grades in order to demonstrate improved lumbopelvic support and increase functional ability.   Baseline:  Goal status: IN PROGRESS 04/13/23  PLAN:  PT FREQUENCY: 1-2x/week  PT DURATION: 12 weeks  PLANNED INTERVENTIONS: 97110-Therapeutic exercises, 97530- Therapeutic activity, 97112- Neuromuscular re-education,  13086- Self Care, [id number]-  Manual therapy, Dry Needling, and Biofeedback  PLAN FOR NEXT SESSION: test hamstring strength, internal pelvic floor muscle exam and treatment to tolerance   Verlena Glenn, PT, DPT04/17/2511:43 AM

## 2023-04-27 NOTE — Patient Instructions (Signed)
 Bladder/bowel retraining:  8-12 grams of fiber with each meal After each meal, go attempt to have a bowel movement within 5 minutes and don't sit on the toilet for more than 5 minutes Drink 4-8oz an hour ONLY WATER - maybe still have the cup of coffee in the morning  Stop water intake 3 hours before bed Try to go at least 2-3 hours between trips to the bathroom - go whether you need to or not.  For two weeks.   [facility] [address] [place], [place] [address] Phone # [phone] Fax [phone]

## 2023-05-01 NOTE — Progress Notes (Signed)
 Left 2nd message for patient regarding anal rectal manometry this Wednesday. Left number to be able to call back and answer questions.

## 2023-05-02 ENCOUNTER — Ambulatory Visit: Payer: Medicare Other

## 2023-05-02 DIAGNOSIS — M5459 Other low back pain: Secondary | ICD-10-CM

## 2023-05-02 DIAGNOSIS — M6281 Muscle weakness (generalized): Secondary | ICD-10-CM

## 2023-05-02 DIAGNOSIS — R102 Pelvic and perineal pain: Secondary | ICD-10-CM

## 2023-05-02 DIAGNOSIS — M62838 Other muscle spasm: Secondary | ICD-10-CM | POA: Diagnosis not present

## 2023-05-02 DIAGNOSIS — M25552 Pain in left hip: Secondary | ICD-10-CM

## 2023-05-02 DIAGNOSIS — R279 Unspecified lack of coordination: Secondary | ICD-10-CM

## 2023-05-02 DIAGNOSIS — R293 Abnormal posture: Secondary | ICD-10-CM

## 2023-05-02 NOTE — Therapy (Signed)
 OUTPATIENT PHYSICAL THERAPY FEMALE PELVIC TREATMENT   Patient Name: Jessica Miles MRN: [id number] DOB:09/20/1945, 78 y.o., female Today's Date: 05/02/2023  END OF SESSION:  PT End of Session - 05/02/23 1058     Visit Number 17    Date for PT Re-Evaluation 05/13/23    Authorization Type Medicare - needs Kx at 15    Progress Note Due on Visit 20    PT Start Time 1100    PT Stop Time 1140    PT Time Calculation (min) 40 min    Activity Tolerance Patient tolerated treatment well    Behavior During Therapy [facility] for tasks assessed/performed              Past Medical History:  Diagnosis Date   Allergy    Anxiety    COVID-19 virus infection    09-23-2019- 9-16 had monoclonal infusion    Depression    History of IBS    HPV (human papilloma virus) infection    Hyperlipidemia    82- not taking statin    Loss of taste    Neuropathy 2015   surgery for Mortons neuroma in 1985   Osteopenia    Osteoporosis    Raynaud's disease 02/2020   Tachycardia    Thyroid  disease    Past Surgical History:  Procedure Laterality Date   BIOPSY  01/25/2022   Procedure: BIOPSY;  Surgeon: Normie Becton., MD;  Location: [facility];  Service: Gastroenterology;;   COLONOSCOPY  2019   hems only    ESOPHAGOGASTRODUODENOSCOPY (EGD) WITH PROPOFOL  N/A 01/25/2022   Procedure: ESOPHAGOGASTRODUODENOSCOPY (EGD) WITH PROPOFOL ;  Surgeon: Normie Becton., MD;  Location: [facility];  Service: Gastroenterology;  Laterality: N/A;   EUS N/A 01/25/2022   Procedure: UPPER ENDOSCOPIC ULTRASOUND (EUS) LINEAR;  Surgeon: Normie Becton., MD;  Location: [facility];  Service: Gastroenterology;  Laterality: N/A;   FOOT NEUROMA SURGERY  1985   WISDOM TOOTH EXTRACTION     Patient Active Problem List   Diagnosis Date Noted   Vertigo 03/10/2023   Right upper quadrant abdominal mass 01/26/2023   Panic attacks 10/26/2022   Allergic reaction to insect bite 06/23/2022   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    Severely underweight adult 09/07/2021   Acute diarrhea 04/27/2021   Inflamed sebaceous cyst 02/11/2021   Transient disorientation 10/20/2020   Memory change 08/05/2020   Family history of early CAD 02/27/2020   Tremor 02/27/2020   Aortic atherosclerosis (HCC) 12/19/2019   Urinary urgency 05/21/2019   Bladder irritation 05/21/2019   External hemorrhoid 05/21/2019   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   Raynaud's syndrome 02/09/2019   Osteoarthritis of thumb, 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   Fatigue 12/19/2017   Acute left-sided low back pain without sciatica 12/19/2017   SVT (supraventricular tachycardia) (HCC) 08/31/2017   Peripheral neuropathy 08/31/2017   Dry eyes 09/21/2015   Atopic dermatitis 03/13/2015   IBS (irritable bowel syndrome) 03/18/2014   Osteoporosis 03/18/2014   Major depressive disorder, recurrent episode, moderate (HCC) 03/18/2014   Hypothyroidism 12/14/2013    PCP: Judithann Novas, MD  REFERRING PROVIDER: Adan Holms, MD  REFERRING DIAG: M79.18 (ICD-10-CM) - Myalgia, other site G58.8 (ICD-10-CM) - Other specified mononeuropathies  THERAPY DIAG:  Other muscle spasm  Muscle weakness (generalized)  Pain in left hip  Other low back pain  Pelvic pain  Abnormal posture  Unspecified lack of coordination  Rationale for Evaluation and Treatment: Rehabilitation  ONSET DATE: 3 years   SUBJECTIVE:  SUBJECTIVE STATEMENT: Pt has been working on eating 3x/day, but has been doing bowel retraining incorrectly. She feels like her lower abdominal pain is due to IC.   PAIN: 05/02/23 Are you having pain? Yes NPRS scale: 1.5/10 Pain location:  Lt hip and perineum/vulva; lower abdomen discomfort  Pain type: soreness Pain description: intermittent   Aggravating factors: sitting Relieving  factors: lying flat, sitting on toilet  PRECAUTIONS: Other: large mass in Rt lower quadrant that has not been treated by medical team  RED FLAGS: None   WEIGHT BEARING RESTRICTIONS: No  FALLS:  Has patient fallen in last 6 months? No  LIVING ENVIRONMENT: Lives with: lives with their family Lives in: House/apartment  OCCUPATION: retired Runner, broadcasting/film/video  PLOF: Independent  PATIENT GOALS: decrease pain  PERTINENT HISTORY:  Interstitial cystitis   Updated 04/13/23:  BOWEL MOVEMENT: Pain with bowel movement: No Type of bowel movement:Frequency up to 5x/hour anywhere from solid to diarrhea; she will go 1-2 hours after eating on average and Strain No Fully empty rectum: No Leakage: Yes: seed size stool can come out without sensation when walking    Pads: No Fiber supplement: No  URINATION: Pain with urination: No Fully empty bladder: Yes: - Stream: Weak Urgency: No - but not drinking much and goes every time she walks by the bathroom  Frequency: as often as she can - very small amounts  Leakage:  none Pads: No  INTERCOURSE: NA  PREGNANCY: Vaginal deliveries 2 Tearing No C-section deliveries 0 Currently pregnant No  PROLAPSE: None   OBJECTIVE:  Note: Objective measures were completed at Evaluation unless otherwise noted. 04/25/23: Grade 1 uterine ligament laxity Significant tenderness with palpation of cervix Paradoxical pelvic floor muscle contraction with attempting to bear down to look for anterior/posterior vaginal wall laxity (could be fear of releasing gas/urine)  04/13/23:               External Perineal Exam dryness, tenderness, labial fusion bil                             Internal Pelvic Floor Pt has increased bil pelvic floor muscle tenderness and tightness; bladder restriction and reproduction of urgency with palpation; reproduction of lower abdominal pain with palpation of tight anterior Rt pelvic floor muscles  Patient confirms identification and  approves PT to assess internal pelvic floor and treatment Yes   PELVIC MMT:    MMT 04/13/23  Vaginal 2/5, 5 second hold, 5 repeat contractions  (Blank rows = not tested)        TONE: High Bil  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, swelling of quadratus  femoris muscle, tendinopathic and frayed hamstring origin Scheduled for anal manometry in April 2025   COGNITION: Overall cognitive status: Within functional limits for tasks assessed     SENSATION: Light touch: Appears intact Proprioception: Appears intact   FUNCTIONAL/SPECIAL TESTS:  Single leg stance:  Rt: more stable, some Lt drop - compensated trendelenburg  Lt: less stable, more Rt drop - compensated trendelenburg Squat: Lt weight shift, Rt valgus knee collapse FAIR: (+) Lt hip Hamstring length: significant decrease with pain on Lt, -75 degrees Curl-up test: breath holding, but no distortion, 2 finger width diastasis in upper abdominals Bed mobility: breath holding  Palpable transversus abdominus contraction bil, but weak Tenderness at Lt ischial tuberosity and hamstrings/Lt posterolateral hip mm  GAIT: Comments: WNL  POSTURE: forward head, decreased lumbar lordosis, posterior pelvic tilt, and Lt lower thoracic/upper lumbar curvature, elevated Lt iliac crest  PELVIC ALIGNMENT: Lt posterior rotation, Lt  sacral facing rotation   LUMBARAROM/PROM:  A/PROM A/PROM  Eval (% available)  Flexion 75, pain on LT  Extension 75  Right lateral flexion 100  Left lateral flexion 100, pain in back and Lt pelvis  Right rotation 75  Left rotation 50, feels good on back   (Blank rows = not tested)   LOWER EXTREMITY MMT:  MMT Right eval Left eval  Hip abduction 5/5 5/5  Hip adduction    Hip internal rotation 3/5 4-/5  Hip external rotation 3/5 4/5   PALPATION:   General: Large mass located in Rt abdomen lateral to umbilicus, non-tender, but with pressure it caused discomfort in low back - patient had not noticed before and reports no doctor has mentioned to her before    TODAY'S TREATMENT:                                                                                                                              DATE:  4/22825 Manual: Abdominal soft tissue mobilization External bladder mobilization  Therapeutic activities: Reviewed bowel retraining and when to attempt toilet attempts Elimination diet - went over bladder irritants again and things specific to IC Importance of PT for management of IC symptoms   04/27/23 Exercises: Kneeling hip flexor stretch 2 min bil Modified thomas stretch 2 min bil Therapeutic activities: Bowel and bladder retraining Talked about habitual bowel habits and having created some hypersensitivity Bladder irritants    04/25/23 Manual: Pt provides verbal consent for internal vaginal/rectal pelvic floor exam. Internal vaginal pelvic floor muscle exam  Bil levator ani release  Bladder mobilization bil Cervix mobilization Therapeutic activities: Inverted lying position to help decrease abdominal pressure     PATIENT EDUCATION:  Education details: See above Person educated: Patient Education method: Explanation, Demonstration, Tactile cues, Verbal cues, and Handouts Education comprehension: verbalized understanding  HOME EXERCISE  PROGRAM: [email or web address]  ASSESSMENT:  CLINICAL IMPRESSION: Pt feeling relieved that she has determined that a lot of her lower abdominal pain is likely due to interstitial cystitis. Believe that she  is correct in this thinking, but she is underestimating the role of diet and PT in symptoms management. She reports that she does feel a lot of relief with manual technique sto her abdomen. We continued these today with reduction in bladder restriction and improved comfort. We went over elimination diet in addition to bladder irritants and reviewed bowel retraining due to having been performing incorrectly. She is very agreeable to working on some of these life style interventions in order to help reduce symptoms. She will continue to benefit from skilled PT intervention in order to decrease pelvic pain, improve low back/Lt hip pain,decrease urinary urgency, decrease lower abdominal pain, and begin/progress functional strengthening program.   OBJECTIVE IMPAIRMENTS: decreased activity tolerance, decreased coordination, decreased endurance, decreased mobility, decreased strength, increased fascial restrictions, increased muscle spasms, impaired tone, postural dysfunction, and pain.   ACTIVITY LIMITATIONS: lifting, bending, sitting, squatting, and bed mobility  PARTICIPATION LIMITATIONS: cleaning, driving, and community activity  PERSONAL FACTORS: 1 comorbidity: medical history  are also affecting patient's functional outcome.   REHAB POTENTIAL: Good  CLINICAL DECISION MAKING: Evolving/moderate complexity  EVALUATION COMPLEXITY: Moderate   GOALS: Goals reviewed with patient? Yes  SHORT TERM GOALS: Target date: 02/21/23 - updated 04/13/23  Pt will be independent with HEP.   Baseline: Goal status: MET 04/13/23  2.  Pt will be able to teach back and utilize urge suppression technique in order to help reduce number of trips to the bathroom.    Baseline:  Goal status: IN PROGRESS 04/13/23  3.  Pt will  begin use of pelvic floor muscle wand on regular basis (2-3x/week) in order to help reduce pelvic floor muscle tension and pain. Baseline:  Goal status: IN PROGRESS 04/13/23  4.  Pt will be independent with diaphragmatic breathing and down training activities in order to improve pelvic floor relaxation.  Baseline:  Goal status: IN PROGRESS 04/13/23  5.  Pt will report pelvic pain no greater than 4/10. Baseline:  Goal status: IN PROGRESS 04/13/23   LONG TERM GOALS: Target date: 03/21/23  Pt will be independent with advanced HEP.   Baseline:  Goal status: IN PROGRESS 04/13/23  2.  Pt will be able to go 2-3 hours in between voids without urgency or incontinence in order to improve QOL and perform all functional activities with less difficulty.   Baseline:  Goal status: IN PROGRESS 04/13/23  3.  Pt will report pelvic pain no greater than 2/10.  Baseline:  Goal status: IN PROGRESS 04/13/23  4.  Pt will be able to sit for >30 minutes without any increased pelvic pain.  Baseline:  Goal status: IN PROGRESS 04/13/23  5.  Pt will be able to stand on single leg without pelvic drop and squat without weight shift in order to demonstrate improved functional core and hip strength to more appropriately stabilize pelvis.  Baseline:  Goal status: IN PROGRESS 04/13/23  6.  Pt will demonstrate increase in all impaired hip strength by 1 muscle grades in order to demonstrate improved lumbopelvic support and increase functional ability.   Baseline:  Goal status: IN PROGRESS 04/13/23  PLAN:  PT FREQUENCY: 1-2x/week  PT DURATION: 12 weeks  PLANNED INTERVENTIONS: 97110-Therapeutic exercises, 97530- Therapeutic activity, 97112- Neuromuscular re-education, 97535- Self Care, [id number]- Manual therapy, Dry Needling, and Biofeedback  PLAN FOR NEXT SESSION: test hamstring strength, internal pelvic floor muscle exam and treatment to tolerance   Verlena Glenn, PT, DPT04/22/2511:47 AM

## 2023-05-03 ENCOUNTER — Ambulatory Visit (HOSPITAL_COMMUNITY)
Admission: RE | Admit: 2023-05-03 | Discharge: 2023-05-03 | Disposition: A | Discharge status: Home / Self Care | Payer: Medicare Other | Attending: Gastroenterology | Admitting: Gastroenterology

## 2023-05-03 ENCOUNTER — Encounter (HOSPITAL_COMMUNITY)
Admission: RE | Disposition: A | Discharge status: Home / Self Care | Payer: Self-pay | Source: Home / Self Care | Attending: Gastroenterology

## 2023-05-03 DIAGNOSIS — K5902 Outlet dysfunction constipation: Secondary | ICD-10-CM

## 2023-05-03 DIAGNOSIS — K6289 Other specified diseases of anus and rectum: Secondary | ICD-10-CM

## 2023-05-03 DIAGNOSIS — R197 Diarrhea, unspecified: Secondary | ICD-10-CM | POA: Diagnosis present

## 2023-05-03 DIAGNOSIS — R159 Full incontinence of feces: Secondary | ICD-10-CM

## 2023-05-03 HISTORY — PX: ANAL RECTAL MANOMETRY: SHX6358

## 2023-05-03 SURGERY — MANOMETRY, ANORECTAL

## 2023-05-03 NOTE — Progress Notes (Signed)
Anal manometry done per protocol. Pt tolerated well without distress or complication   

## 2023-05-04 ENCOUNTER — Ambulatory Visit: Payer: Medicare Other

## 2023-05-04 DIAGNOSIS — R279 Unspecified lack of coordination: Secondary | ICD-10-CM

## 2023-05-04 DIAGNOSIS — M62838 Other muscle spasm: Secondary | ICD-10-CM | POA: Diagnosis not present

## 2023-05-04 DIAGNOSIS — M25552 Pain in left hip: Secondary | ICD-10-CM

## 2023-05-04 DIAGNOSIS — R102 Pelvic and perineal pain: Secondary | ICD-10-CM

## 2023-05-04 DIAGNOSIS — M5459 Other low back pain: Secondary | ICD-10-CM

## 2023-05-04 DIAGNOSIS — R293 Abnormal posture: Secondary | ICD-10-CM

## 2023-05-04 DIAGNOSIS — M6281 Muscle weakness (generalized): Secondary | ICD-10-CM

## 2023-05-04 NOTE — Therapy (Signed)
 OUTPATIENT PHYSICAL THERAPY FEMALE PELVIC TREATMENT   Patient Name: Carizma Dunsworth MRN: [id number] DOB:22-Jul-1945, 78 y.o., female Today's Date: 05/04/2023  END OF SESSION:  PT End of Session - 05/04/23 1102     Visit Number 18    Date for PT Re-Evaluation 05/13/23    Authorization Type Medicare - needs Kx at 15    Progress Note Due on Visit 20    PT Start Time 1100    PT Stop Time 1138    PT Time Calculation (min) 38 min    Activity Tolerance Patient tolerated treatment well    Behavior During Therapy [facility] for tasks assessed/performed              Past Medical History:  Diagnosis Date   Allergy    Anxiety    COVID-19 virus infection    09-23-2019- 9-16 had monoclonal infusion    Depression    History of IBS    HPV (human papilloma virus) infection    Hyperlipidemia    83- not taking statin    Loss of taste    Neuropathy 2015   surgery for Mortons neuroma in 1985   Osteopenia    Osteoporosis    Raynaud's disease 02/2020   Tachycardia    Thyroid  disease    Past Surgical History:  Procedure Laterality Date   BIOPSY  01/25/2022   Procedure: BIOPSY;  Surgeon: Normie Becton., MD;  Location: [facility];  Service: Gastroenterology;;   COLONOSCOPY  2019   hems only    ESOPHAGOGASTRODUODENOSCOPY (EGD) WITH PROPOFOL  N/A 01/25/2022   Procedure: ESOPHAGOGASTRODUODENOSCOPY (EGD) WITH PROPOFOL ;  Surgeon: Normie Becton., MD;  Location: [facility];  Service: Gastroenterology;  Laterality: N/A;   EUS N/A 01/25/2022   Procedure: UPPER ENDOSCOPIC ULTRASOUND (EUS) LINEAR;  Surgeon: Normie Becton., MD;  Location: [facility];  Service: Gastroenterology;  Laterality: N/A;   FOOT NEUROMA SURGERY  1985   WISDOM TOOTH EXTRACTION     Patient Active Problem List   Diagnosis Date Noted   Vertigo 03/10/2023   Right upper quadrant abdominal mass 01/26/2023   Panic attacks 10/26/2022   Allergic reaction to insect bite 06/23/2022   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    Severely underweight adult 09/07/2021   Acute diarrhea 04/27/2021   Inflamed sebaceous cyst 02/11/2021   Transient disorientation 10/20/2020   Memory change 08/05/2020   Family history of early CAD 02/27/2020   Tremor 02/27/2020   Aortic atherosclerosis (HCC) 12/19/2019   Urinary urgency 05/21/2019   Bladder irritation 05/21/2019   External hemorrhoid 05/21/2019   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   Raynaud's syndrome 02/09/2019   Osteoarthritis of thumb, 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   Fatigue 12/19/2017   Acute left-sided low back pain without sciatica 12/19/2017   SVT (supraventricular tachycardia) (HCC) 08/31/2017   Peripheral neuropathy 08/31/2017   Dry eyes 09/21/2015   Atopic dermatitis 03/13/2015   IBS (irritable bowel syndrome) 03/18/2014   Osteoporosis 03/18/2014   Major depressive disorder, recurrent episode, moderate (HCC) 03/18/2014   Hypothyroidism 12/14/2013    PCP: Judithann Novas, MD  REFERRING PROVIDER: Adan Holms, MD  REFERRING DIAG: M79.18 (ICD-10-CM) - Myalgia, other site G58.8 (ICD-10-CM) - Other specified mononeuropathies  THERAPY DIAG:  Other muscle spasm  Muscle weakness (generalized)  Pain in left hip  Other low back pain  Pelvic pain  Abnormal posture  Unspecified lack of coordination  Rationale for Evaluation and Treatment: Rehabilitation  ONSET DATE: 3 years   SUBJECTIVE:  SUBJECTIVE STATEMENT: Pt has gotten book on interstitial cystitis and feels like it has been very helpful. She has realized that walking is just uncomfortable due to increased bladder pressure; she states that she went for a bike ride instead.   PAIN: 05/04/23 Are you having pain? Yes NPRS scale: 1.5/10 Pain location:  Lt hip and perineum/vulva; lower abdomen discomfort  Pain type:  soreness Pain description: intermittent   Aggravating factors: sitting Relieving factors: lying flat, sitting on toilet  PRECAUTIONS: Other: large mass in Rt lower quadrant that has not been treated by medical team  RED FLAGS: None   WEIGHT BEARING RESTRICTIONS: No  FALLS:  Has patient fallen in last 6 months? No  LIVING ENVIRONMENT: Lives with: lives with their family Lives in: House/apartment  OCCUPATION: retired Runner, broadcasting/film/video  PLOF: Independent  PATIENT GOALS: decrease pain  PERTINENT HISTORY:  Interstitial cystitis   Updated 04/13/23:  BOWEL MOVEMENT: Pain with bowel movement: No Type of bowel movement:Frequency up to 5x/hour anywhere from solid to diarrhea; she will go 1-2 hours after eating on average and Strain No Fully empty rectum: No Leakage: Yes: seed size stool can come out without sensation when walking    Pads: No Fiber supplement: No  URINATION: Pain with urination: No Fully empty bladder: Yes: - Stream: Weak Urgency: No - but not drinking much and goes every time she walks by the bathroom  Frequency: as often as she can - very small amounts  Leakage:  none Pads: No  INTERCOURSE: NA  PREGNANCY: Vaginal deliveries 2 Tearing No C-section deliveries 0 Currently pregnant No  PROLAPSE: None   OBJECTIVE:  Note: Objective measures were completed at Evaluation unless otherwise noted. 04/25/23: Grade 1 uterine ligament laxity Significant tenderness with palpation of cervix Paradoxical pelvic floor muscle contraction with attempting to bear down to look for anterior/posterior vaginal wall laxity (could be fear of releasing gas/urine)  04/13/23:               External Perineal Exam dryness, tenderness, labial fusion bil                             Internal Pelvic Floor Pt has increased bil pelvic floor muscle tenderness and tightness; bladder restriction and reproduction of urgency with palpation; reproduction of lower abdominal pain with palpation of  tight anterior Rt pelvic floor muscles  Patient confirms identification and approves PT to assess internal pelvic floor and treatment Yes   PELVIC MMT:    MMT 04/13/23  Vaginal 2/5, 5 second hold, 5 repeat contractions  (Blank rows = not tested)        TONE: High Bil  01/26/23:               External Perineal Exam dryness, tenderness, labial fusion bil                             Internal Pelvic Floor significant superficial tenderness with report of burning, pinching and stinging; muscle spasm and aching in Lt deep pelvic floor muscle   Patient confirms identification and approves PT to assess internal pelvic floor and treatment Yes   PELVIC MMT:    MMT eval  Vaginal 1/5, 4 second hold, 6 repeat contractions  (Blank rows = not tested)        TONE: High Lt side  PROLAPSE: Not tested   01/24/23: DIAGNOSTIC FINDINGS:  CT Lt hip:  Mild OA, 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, swelling of quadratus femoris muscle, tendinopathic and frayed hamstring origin Scheduled for anal manometry in April 2025   COGNITION: Overall cognitive status: Within functional limits for tasks assessed     SENSATION: Light touch: Appears intact Proprioception: Appears intact   FUNCTIONAL/SPECIAL TESTS:  Single leg stance:  Rt: more stable, some Lt drop - compensated trendelenburg  Lt: less stable, more Rt drop - compensated trendelenburg Squat: Lt weight shift, Rt valgus knee collapse FAIR: (+) Lt hip Hamstring length: significant decrease with pain on Lt, -75 degrees Curl-up test: breath holding, but no distortion, 2 finger width diastasis in upper abdominals Bed mobility: breath holding  Palpable transversus abdominus contraction bil, but weak Tenderness at Lt ischial tuberosity and hamstrings/Lt posterolateral hip mm  GAIT: Comments: WNL  POSTURE: forward head, decreased lumbar lordosis, posterior pelvic tilt, and Lt lower thoracic/upper lumbar  curvature, elevated Lt iliac crest  PELVIC ALIGNMENT: Lt posterior rotation, Lt sacral facing rotation   LUMBARAROM/PROM:  A/PROM A/PROM  Eval (% available)  Flexion 75, pain on LT  Extension 75  Right lateral flexion 100  Left lateral flexion 100, pain in back and Lt pelvis  Right rotation 75  Left rotation 50, feels good on back   (Blank rows = not tested)   LOWER EXTREMITY MMT:  MMT Right eval Left eval  Hip abduction 5/5 5/5  Hip adduction    Hip internal rotation 3/5 4-/5  Hip external rotation 3/5 4/5   PALPATION:   General: Large mass located in Rt abdomen lateral to umbilicus, non-tender, but with pressure it caused discomfort in low back - patient had not noticed before and reports no doctor has mentioned to her before    TODAY'S TREATMENT:                                                                                                                              DATE:  05/04/23 Manual: Abdominal soft tissue mobilization External bladder mobilization Abdominal/bladder skin rolling Negative pressure soft tissue mobilization  4/22825 Manual: Abdominal soft tissue mobilization External bladder mobilization Therapeutic activities: Reviewed bowel retraining and when to attempt toilet attempts Elimination diet - went over bladder irritants again and things specific to IC Importance of PT for management of IC symptoms   04/27/23 Exercises: Kneeling hip flexor stretch 2 min bil Modified thomas stretch 2 min bil Therapeutic activities: Bowel and bladder retraining Talked about habitual bowel habits and having created some hypersensitivity Bladder irritants   PATIENT EDUCATION:  Education details: See above Person educated: Patient Education method: Explanation, Demonstration, Tactile cues, Verbal cues, and Handouts Education comprehension: verbalized understanding  HOME EXERCISE PROGRAM: [id number]  ASSESSMENT:  CLINICAL IMPRESSION: We focused on  external manual techniques today, discussing how to perform at home as well; she agree sto look into getting suction cups and try skin rolling. She had great relief in lower abdomen from these techniques during  session today. She is feeling very encouraged about interstitial cystitis symptoms after doing more reading and feeling like she may have more control over symptoms. She will continue to benefit from skilled PT intervention in order to decrease pelvic pain, improve low back/Lt hip pain,decrease urinary urgency, decrease lower abdominal pain, and begin/progress functional strengthening program.   OBJECTIVE IMPAIRMENTS: decreased activity tolerance, decreased coordination, decreased endurance, decreased mobility, decreased strength, increased fascial restrictions, increased muscle spasms, impaired tone, postural dysfunction, and pain.   ACTIVITY LIMITATIONS: lifting, bending, sitting, squatting, and bed mobility  PARTICIPATION LIMITATIONS: cleaning, driving, and community activity  PERSONAL FACTORS: 1 comorbidity: medical history  are also affecting patient's functional outcome.   REHAB POTENTIAL: Good  CLINICAL DECISION MAKING: Evolving/moderate complexity  EVALUATION COMPLEXITY: Moderate   GOALS: Goals reviewed with patient? Yes  SHORT TERM GOALS: Target date: 02/21/23 - updated 04/13/23  Pt will be independent with HEP.   Baseline: Goal status: MET 04/13/23  2.  Pt will be able to teach back and utilize urge suppression technique in order to help reduce number of trips to the bathroom.    Baseline:  Goal status: IN PROGRESS 04/13/23  3.  Pt will begin use of pelvic floor muscle wand on regular basis (2-3x/week) in order to help reduce pelvic floor muscle tension and pain. Baseline:  Goal status: IN PROGRESS 04/13/23  4.  Pt will be independent with diaphragmatic breathing and down training activities in order to improve pelvic floor relaxation.  Baseline:  Goal status: IN  PROGRESS 04/13/23  5.  Pt will report pelvic pain no greater than 4/10. Baseline:  Goal status: IN PROGRESS 04/13/23   LONG TERM GOALS: Target date: 03/21/23  Pt will be independent with advanced HEP.   Baseline:  Goal status: IN PROGRESS 04/13/23  2.  Pt will be able to go 2-3 hours in between voids without urgency or incontinence in order to improve QOL and perform all functional activities with less difficulty.   Baseline:  Goal status: IN PROGRESS 04/13/23  3.  Pt will report pelvic pain no greater than 2/10.  Baseline:  Goal status: IN PROGRESS 04/13/23  4.  Pt will be able to sit for >30 minutes without any increased pelvic pain.  Baseline:  Goal status: IN PROGRESS 04/13/23  5.  Pt will be able to stand on single leg without pelvic drop and squat without weight shift in order to demonstrate improved functional core and hip strength to more appropriately stabilize pelvis.  Baseline:  Goal status: IN PROGRESS 04/13/23  6.  Pt will demonstrate increase in all impaired hip strength by 1 muscle grades in order to demonstrate improved lumbopelvic support and increase functional ability.   Baseline:  Goal status: IN PROGRESS 04/13/23  PLAN:  PT FREQUENCY: 1-2x/week  PT DURATION: 12 weeks  PLANNED INTERVENTIONS: 97110-Therapeutic exercises, 97530- Therapeutic activity, 97112- Neuromuscular re-education, 97535- Self Care, [id number]- Manual therapy, Dry Needling, and Biofeedback  PLAN FOR NEXT SESSION: test hamstring strength, internal pelvic floor muscle exam and treatment to tolerance   Verlena Glenn, PT, DPT04/24/2511:41 AM

## 2023-05-07 ENCOUNTER — Encounter (HOSPITAL_COMMUNITY): Payer: Self-pay | Admitting: Gastroenterology

## 2023-05-09 ENCOUNTER — Ambulatory Visit

## 2023-05-09 DIAGNOSIS — M62838 Other muscle spasm: Secondary | ICD-10-CM | POA: Diagnosis not present

## 2023-05-09 DIAGNOSIS — R293 Abnormal posture: Secondary | ICD-10-CM

## 2023-05-09 DIAGNOSIS — M25552 Pain in left hip: Secondary | ICD-10-CM

## 2023-05-09 DIAGNOSIS — R102 Pelvic and perineal pain: Secondary | ICD-10-CM

## 2023-05-09 DIAGNOSIS — R279 Unspecified lack of coordination: Secondary | ICD-10-CM

## 2023-05-09 DIAGNOSIS — M5459 Other low back pain: Secondary | ICD-10-CM

## 2023-05-09 DIAGNOSIS — M6281 Muscle weakness (generalized): Secondary | ICD-10-CM

## 2023-05-09 NOTE — Patient Instructions (Signed)
   The first picture shows that there is no effect on the pelvic floor with gravity eliminated. The next three show that with a wedge pillow or a few pillows from home under your pelvis the pelvic floor is inverted and may relax and allows gravity to help return prolapsed areas more inward to help relieve symptoms. Do this 15-20 mins every evening when symptoms tend to be worse. Stop if you have pain or negative symptoms.

## 2023-05-09 NOTE — Therapy (Signed)
 OUTPATIENT PHYSICAL THERAPY FEMALE PELVIC TREATMENT   Patient Name: Jessica Miles MRN: [id number] DOB:November 01, 1945, 78 y.o., female Today's Date: 05/09/2023  END OF SESSION:  PT End of Session - 05/09/23 1145     Visit Number 19    Date for PT Re-Evaluation 08/01/23    Authorization Type Medicare - needs Kx at 15    Progress Note Due on Visit 20    PT Start Time 1145    PT Stop Time 1225    PT Time Calculation (min) 40 min    Activity Tolerance Patient tolerated treatment well    Behavior During Therapy [facility] for tasks assessed/performed              Past Medical History:  Diagnosis Date   Allergy    Anxiety    COVID-19 virus infection    09-23-2019- 9-16 had monoclonal infusion    Depression    History of IBS    HPV (human papilloma virus) infection    Hyperlipidemia    82- not taking statin    Loss of taste    Neuropathy 2015   surgery for Mortons neuroma in 1985   Osteopenia    Osteoporosis    Raynaud's disease 02/2020   Tachycardia    Thyroid  disease    Past Surgical History:  Procedure Laterality Date   ANAL RECTAL MANOMETRY N/A 05/03/2023   Procedure: [name];  Surgeon: Normie Becton., MD;  Location: [facility];  Service: Gastroenterology;  Laterality: N/A;   BIOPSY  01/25/2022   Procedure: BIOPSY;  Surgeon: Normie Becton., MD;  Location: [facility];  Service: Gastroenterology;;   COLONOSCOPY  2019   hems only    ESOPHAGOGASTRODUODENOSCOPY (EGD) WITH PROPOFOL  N/A 01/25/2022   Procedure: ESOPHAGOGASTRODUODENOSCOPY (EGD) WITH PROPOFOL ;  Surgeon: Normie Becton., MD;  Location: [facility];  Service: Gastroenterology;  Laterality: N/A;   EUS N/A 01/25/2022   Procedure: UPPER ENDOSCOPIC ULTRASOUND (EUS) LINEAR;  Surgeon: Normie Becton., MD;  Location: [facility];  Service: Gastroenterology;  Laterality: N/A;   FOOT NEUROMA SURGERY  1985   WISDOM TOOTH EXTRACTION     Patient Active Problem List   Diagnosis  Date Noted   Vertigo 03/10/2023   Right upper quadrant abdominal mass 01/26/2023   Panic attacks 10/26/2022   Allergic reaction to insect bite 06/23/2022   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   Severely underweight adult 09/07/2021   Acute diarrhea 04/27/2021   Inflamed sebaceous cyst 02/11/2021   Transient disorientation 10/20/2020   Memory change 08/05/2020   Family history of early CAD 02/27/2020   Tremor 02/27/2020   Aortic atherosclerosis (HCC) 12/19/2019   Urinary urgency 05/21/2019   Bladder irritation 05/21/2019   External hemorrhoid 05/21/2019   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   Raynaud's syndrome 02/09/2019   Osteoarthritis of thumb, 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   Fatigue 12/19/2017   Acute left-sided low back pain without sciatica 12/19/2017   SVT (supraventricular tachycardia) (HCC) 08/31/2017   Peripheral neuropathy 08/31/2017   Dry eyes 09/21/2015   Atopic dermatitis 03/13/2015   IBS (irritable bowel syndrome) 03/18/2014   Osteoporosis 03/18/2014   Major depressive disorder, recurrent episode, moderate (HCC) 03/18/2014   Hypothyroidism 12/14/2013    PCP: Judithann Novas, MD  REFERRING PROVIDER: Adan Holms, MD  REFERRING DIAG: M79.18 (ICD-10-CM) - Myalgia, other site G58.8 (ICD-10-CM) - Other specified mononeuropathies  THERAPY DIAG:  Other muscle spasm  Muscle weakness (generalized)  Pain in left hip  Other low back pain  Pelvic pain  Abnormal posture  Unspecified lack of coordination  Rationale for Evaluation and Treatment: Rehabilitation  ONSET DATE: 3 years   SUBJECTIVE:                                                                                                                                                                                           SUBJECTIVE STATEMENT: Pt states that she took an exercise class last week which caused her significant pain in her abdomen that she has never felt before - she was unable to sit without feeling sharp pain in  her abdomen. She states that pain has been improving over the last couple of days. Pt states that she is feeling better just learning about Interstitial cystitis and eating/drinking more. She has been able to become more aware of different sensations in her body.   PAIN: 05/09/23 Are you having pain? Yes NPRS scale: 1/10 (4/10 over the weekend) Pain location:  Lt hip and perineum/vulva; lower abdomen discomfort  Pain type: soreness Pain description: intermittent   Aggravating factors: sitting Relieving factors: lying flat, sitting on toilet  PRECAUTIONS: Other: large mass in Rt lower quadrant that has not been treated by medical team  RED FLAGS: None   WEIGHT BEARING RESTRICTIONS: No  FALLS:  Has patient fallen in last 6 months? No  LIVING ENVIRONMENT: Lives with: lives with their family Lives in: House/apartment  OCCUPATION: retired Runner, broadcasting/film/video  PLOF: Independent  PATIENT GOALS: decrease pain  PERTINENT HISTORY:  Interstitial cystitis   Updated 04/13/23:  BOWEL MOVEMENT: Pain with bowel movement: No Type of bowel movement:Frequency up to 5x/hour anywhere from solid to diarrhea; she will go 1-2 hours after eating on average and Strain No Fully empty rectum: No Leakage: Yes: seed size stool can come out without sensation when walking    Pads: No Fiber supplement: No  URINATION: Pain with urination: No Fully empty bladder: Yes: - Stream: Weak Urgency: No - but not drinking much and goes every time she walks by the bathroom  Frequency: as often as she can - very small amounts  Leakage:  none Pads: No  INTERCOURSE: NA  PREGNANCY: Vaginal deliveries 2 Tearing No C-section deliveries 0 Currently pregnant No  PROLAPSE: None   OBJECTIVE:  Note: Objective measures were completed at Evaluation unless otherwise noted. 05/09/23: Grade 1 uterine ligament laxity Significant tenderness with palpation of cervix Significant improvement with paradoxical contraction -  not present anymore with lift when squeezing and appropriate lengthening with bearing down    04/25/23: Grade 1 uterine ligament laxity Significant tenderness with palpation of cervix Paradoxical pelvic floor muscle contraction with attempting to bear down to look for anterior/posterior  vaginal wall laxity (could be fear of releasing gas/urine)  04/13/23:               External Perineal Exam dryness, tenderness, labial fusion bil                             Internal Pelvic Floor Pt has increased bil pelvic floor muscle tenderness and tightness; bladder restriction and reproduction of urgency with palpation; reproduction of lower abdominal pain with palpation of tight anterior Rt pelvic floor muscles  Patient confirms identification and approves PT to assess internal pelvic floor and treatment Yes   PELVIC MMT:    MMT 04/13/23  Vaginal 2/5, 5 second hold, 5 repeat contractions  (Blank rows = not tested)        TONE: High Bil  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, swelling of quadratus femoris muscle, tendinopathic and frayed hamstring origin Scheduled for anal manometry in April 2025   COGNITION: Overall cognitive status: Within functional limits for tasks assessed     SENSATION: Light touch: Appears intact Proprioception: Appears intact   FUNCTIONAL/SPECIAL TESTS:   Single leg stance:  Rt: more stable, some Lt drop - compensated trendelenburg  Lt: less stable, more Rt drop - compensated trendelenburg Squat: Lt weight shift, Rt valgus knee collapse FAIR: (+) Lt hip Hamstring length: significant decrease with pain on Lt, -75 degrees Curl-up test: breath holding, but no distortion, 2 finger width diastasis in upper abdominals Bed mobility: breath holding  Palpable transversus abdominus contraction bil, but weak Tenderness at Lt ischial tuberosity and hamstrings/Lt posterolateral hip mm  GAIT: Comments: WNL  POSTURE: forward head, decreased lumbar lordosis, posterior pelvic tilt, and Lt lower thoracic/upper lumbar curvature, elevated Lt iliac crest  PELVIC ALIGNMENT: Lt posterior rotation, Lt sacral facing rotation   LUMBARAROM/PROM:  A/PROM A/PROM  Eval (% available)  Flexion 75, pain on LT  Extension 75  Right lateral flexion 100  Left lateral flexion 100, pain in back and Lt pelvis  Right rotation 75  Left rotation 50, feels good on back   (Blank rows = not tested)   LOWER EXTREMITY MMT:  MMT Right eval Left eval  Hip abduction 5/5 5/5  Hip adduction    Hip internal rotation 3/5 4-/5  Hip external rotation 3/5 4/5   PALPATION:   General: Large mass located in Rt abdomen lateral to umbilicus, non-tender, but with pressure it caused discomfort in low back - patient had not noticed before and reports no doctor has mentioned to her before    TODAY'S TREATMENT:  DATE:  05/09/23 Neuromuscular re-education: Pt education on TENs and we used her unit - some difficulty with her unit and getting it to work; discussed electrode placement External palpation of pelvic floor muscle contraction and bearing down Therapeutic activities: Discussed what may have happened with significant pressure to abdomen with small ball  in prone - may have put pressure on bladder and uterine ligaments Inverted lying position    05/04/23 Manual: Abdominal soft tissue mobilization External bladder mobilization Abdominal/bladder skin rolling Negative pressure soft tissue mobilization  [id number] Manual: Abdominal soft tissue mobilization External bladder mobilization Therapeutic activities: Reviewed bowel retraining and when to attempt toilet attempts Elimination diet - went over bladder irritants again and things specific to IC Importance of PT for management of IC symptoms    PATIENT EDUCATION:  Education details: See above Person educated: Patient Education method: Explanation, Demonstration, Tactile cues, Verbal cues, and Handouts Education comprehension: verbalized understanding  HOME EXERCISE PROGRAM: [id number]  ASSESSMENT:  CLINICAL IMPRESSION: Pt overall is making some very good progress. She has been able to decrease her anxiety surrounding abdominal and low back pain. She has also been able to begin eating and drinking; believe a lot of her feeling poorly was stemming from not enough intake and she was limiting due to issues with bowel and bladder. This has resolved and we have seen some good improvements in urgency and bladder control with bladder retraining and the urge drill. She was been doing research and we have been discussing other treatments to help decrease interstitial cystitis flares, like eliminatoin diets. We have also been focusing on manual techniques to help improve bladder/abdominal discomfort which has been very helpful. She had notable increase in pain several days ago in exercise class that placed a lot of pressure in abdomen. Due to recent findings of some mild uterine ligament laxity and significant irritation of cervix, this may have placed some increased tension on these uterine ligaments leading to the increase in abdominal pain. She has seen improvements in the last couple of days and  we discussed inverted lying position today to help with this. We were also able to go over TNEs unit for pain control and discussed different electrode positions and current options. She will continue to benefit from skilled PT intervention in order to decrease pelvic pain, improve low back/Lt hip pain,decrease urinary urgency, decrease lower abdominal pain, and begin/progress functional strengthening program.   OBJECTIVE IMPAIRMENTS: decreased activity tolerance, decreased coordination, decreased endurance, decreased mobility, decreased strength, increased fascial restrictions, increased muscle spasms, impaired tone, postural dysfunction, and pain.   ACTIVITY LIMITATIONS: lifting, bending, sitting, squatting, and bed mobility  PARTICIPATION LIMITATIONS: cleaning, driving, and community activity  PERSONAL FACTORS: 1 comorbidity: medical history  are also affecting patient's functional outcome.   REHAB POTENTIAL: Good  CLINICAL DECISION MAKING: Evolving/moderate complexity  EVALUATION COMPLEXITY: Moderate   GOALS: Goals reviewed with patient? Yes  SHORT TERM GOALS: Updated 05/09/23  Pt will be independent with HEP.   Baseline: Goal status: MET 04/13/23  2.  Pt will be able to teach back and utilize urge suppression technique in order to help reduce number of trips to the bathroom.    Baseline:  Goal status: MET 05/09/23  3.  Pt will begin use of pelvic floor muscle wand on regular basis (2-3x/week) in order to help reduce pelvic floor muscle tension and pain. Baseline:  Goal status: IN PROGRESS 05/09/23  4.  Pt will be independent with diaphragmatic breathing and down training activities in order to  improve pelvic floor relaxation.  Baseline:  Goal status: MET 05/09/23  5.  Pt will report pelvic pain no greater than 4/10. Baseline:  Goal status: MET 05/09/23   LONG TERM GOALS: Updated 05/09/23  Pt will be independent with advanced HEP.   Baseline:  Goal status: IN PROGRESS  05/09/23  2.  Pt will be able to go 2-3 hours in between voids without urgency or incontinence in order to improve QOL and perform all functional activities with less difficulty.   Baseline:  Goal status: IN PROGRESS 05/09/23  3.  Pt will report pelvic pain no greater than 2/10.  Baseline:  Goal status: IN PROGRESS 05/09/23  4.  Pt will be able to sit for >30 minutes without any increased pelvic pain.  Baseline:  Goal status: IN PROGRESS 05/09/23  5.  Pt will be able to stand on single leg without pelvic drop and squat without weight shift in order to demonstrate improved functional core and hip strength to more appropriately stabilize pelvis.  Baseline:  Goal status: IN PROGRESS 05/09/23  6.  Pt will demonstrate increase in all impaired hip strength by 1 muscle grades in order to demonstrate improved lumbopelvic support and increase functional ability.   Baseline:  Goal status: IN PROGRESS 05/09/23  PLAN:  PT FREQUENCY: 1-2x/week  PT DURATION: 12 weeks  PLANNED INTERVENTIONS: 97110-Therapeutic exercises, 97530- Therapeutic activity, 97112- Neuromuscular re-education, 97535- Self Care, [id number]- Manual therapy, Dry Needling, and Biofeedback  PLAN FOR NEXT SESSION: Plan to work on breathing techniques and manual techniques to abdomen   Verlena Glenn, PT, DPT04/29/2512:32 PM

## 2023-05-11 ENCOUNTER — Ambulatory Visit: Attending: Obstetrics and Gynecology

## 2023-05-11 DIAGNOSIS — R279 Unspecified lack of coordination: Secondary | ICD-10-CM | POA: Insufficient documentation

## 2023-05-11 DIAGNOSIS — M5459 Other low back pain: Secondary | ICD-10-CM | POA: Diagnosis present

## 2023-05-11 DIAGNOSIS — M6281 Muscle weakness (generalized): Secondary | ICD-10-CM | POA: Diagnosis present

## 2023-05-11 DIAGNOSIS — M25552 Pain in left hip: Secondary | ICD-10-CM | POA: Diagnosis present

## 2023-05-11 DIAGNOSIS — M62838 Other muscle spasm: Secondary | ICD-10-CM | POA: Insufficient documentation

## 2023-05-11 DIAGNOSIS — R293 Abnormal posture: Secondary | ICD-10-CM | POA: Insufficient documentation

## 2023-05-11 DIAGNOSIS — R102 Pelvic and perineal pain: Secondary | ICD-10-CM | POA: Diagnosis present

## 2023-05-11 NOTE — Therapy (Signed)
 OUTPATIENT PHYSICAL THERAPY FEMALE PELVIC TREATMENT  Progress Note Reporting Period 02/16/23 to 05/11/23  See note below for Objective Data and Assessment of Progress/Goals.     Patient Name: Jessica Miles MRN: [id number] DOB:1945/01/23, 78 y.o., female Today's Date: 05/11/2023  END OF SESSION:  PT End of Session - 05/11/23 1102     Visit Number 20    Date for PT Re-Evaluation 08/01/23    Authorization Type Medicare - needs Kx at 15    Progress Note Due on Visit 30    PT Start Time 1100    PT Stop Time 1140    PT Time Calculation (min) 40 min    Activity Tolerance Patient tolerated treatment well    Behavior During Therapy [facility] for tasks assessed/performed              Past Medical History:  Diagnosis Date   Allergy    Anxiety    COVID-19 virus infection    09-23-2019- 9-16 had monoclonal infusion    Depression    History of IBS    HPV (human papilloma virus) infection    Hyperlipidemia    83- not taking statin    Loss of taste    Neuropathy 2015   surgery for Mortons neuroma in 1985   Osteopenia    Osteoporosis    Raynaud's disease 02/2020   Tachycardia    Thyroid  disease    Past Surgical History:  Procedure Laterality Date   ANAL RECTAL MANOMETRY N/A 05/03/2023   Procedure: [name];  Surgeon: Normie Becton., MD;  Location: [facility];  Service: Gastroenterology;  Laterality: N/A;   BIOPSY  01/25/2022   Procedure: BIOPSY;  Surgeon: Normie Becton., MD;  Location: [facility];  Service: Gastroenterology;;   COLONOSCOPY  2019   hems only    ESOPHAGOGASTRODUODENOSCOPY (EGD) WITH PROPOFOL  N/A 01/25/2022   Procedure: ESOPHAGOGASTRODUODENOSCOPY (EGD) WITH PROPOFOL ;  Surgeon: Normie Becton., MD;  Location: [facility];  Service: Gastroenterology;  Laterality: N/A;   EUS N/A 01/25/2022   Procedure: UPPER ENDOSCOPIC ULTRASOUND (EUS) LINEAR;  Surgeon: Normie Becton., MD;  Location: [facility];  Service: Gastroenterology;   Laterality: N/A;   FOOT NEUROMA SURGERY  1985   WISDOM TOOTH EXTRACTION     Patient Active Problem List   Diagnosis Date Noted   Vertigo 03/10/2023   Right upper quadrant abdominal mass 01/26/2023   Panic attacks 10/26/2022   Allergic reaction to insect bite 06/23/2022   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   Severely underweight adult 09/07/2021   Acute diarrhea 04/27/2021   Inflamed sebaceous cyst 02/11/2021   Transient disorientation 10/20/2020   Memory change 08/05/2020   Family history of early CAD 02/27/2020   Tremor 02/27/2020   Aortic atherosclerosis (HCC) 12/19/2019   Urinary urgency 05/21/2019   Bladder irritation 05/21/2019   External hemorrhoid 05/21/2019   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   Raynaud's syndrome 02/09/2019   Osteoarthritis of thumb, 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   Fatigue 12/19/2017   Acute left-sided low back pain without sciatica 12/19/2017   SVT (supraventricular tachycardia) (HCC) 08/31/2017   Peripheral neuropathy 08/31/2017   Dry eyes 09/21/2015   Atopic dermatitis 03/13/2015   IBS (irritable bowel syndrome) 03/18/2014   Osteoporosis 03/18/2014   Major depressive disorder, recurrent episode, moderate (HCC) 03/18/2014   Hypothyroidism 12/14/2013    PCP: Judithann Novas, MD  REFERRING PROVIDER: Adan Holms, MD  REFERRING DIAG: M79.18 (ICD-10-CM) - Myalgia, other site G58.8 (ICD-10-CM) - Other specified mononeuropathies  THERAPY DIAG:  Other muscle spasm  Muscle weakness (generalized)  Pain in left hip  Other low back pain  Pelvic pain  Abnormal posture  Unspecified lack of coordination  Rationale for Evaluation and Treatment: Rehabilitation  ONSET DATE: 3 years   SUBJECTIVE:                                                                                                                                                                                           SUBJECTIVE STATEMENT: Pt states that she has continued to feel a little better ove  rthe course of the week. She does still have some of that sharp abdominal massage with sitting.  PAIN: 05/11/23 Are you having pain? Yes NPRS scale: 1/10 (4/10 over the weekend) Pain location:  Lt hip and perineum/vulva; lower abdomen discomfort  Pain type: soreness Pain description: intermittent   Aggravating factors: sitting Relieving factors: lying flat, sitting on toilet  PRECAUTIONS: Other: large mass in Rt lower quadrant that has not been treated by medical team  RED FLAGS: None   WEIGHT BEARING RESTRICTIONS: No  FALLS:  Has patient fallen in last 6 months? No  LIVING ENVIRONMENT: Lives with: lives with their family Lives in: House/apartment  OCCUPATION: retired Runner, broadcasting/film/video  PLOF: Independent  PATIENT GOALS: decrease pain  PERTINENT HISTORY:  Interstitial cystitis   Updated 04/13/23:  BOWEL MOVEMENT: Pain with bowel movement: No Type of bowel movement:Frequency up to 5x/hour anywhere from solid to diarrhea; she will go 1-2 hours after eating on average and Strain No Fully empty rectum: No Leakage: Yes: seed size stool can come out without sensation when walking    Pads: No Fiber supplement: No  URINATION: Pain with urination: No Fully empty bladder: Yes: - Stream: Weak Urgency: No - but not drinking much and goes every time she walks by the bathroom  Frequency: as often as she can - very small amounts  Leakage:  none Pads: No  INTERCOURSE: NA  PREGNANCY: Vaginal deliveries 2 Tearing No C-section deliveries 0 Currently pregnant No  PROLAPSE: None   OBJECTIVE:  Note: Objective measures were completed at Evaluation unless otherwise noted. 05/09/23: Grade 1 uterine ligament laxity Significant tenderness with palpation of cervix Significant improvement with paradoxical contraction - not present anymore with lift when squeezing and appropriate lengthening with bearing down    04/25/23: Grade 1 uterine ligament laxity Significant tenderness with  palpation of cervix Paradoxical pelvic floor muscle contraction with attempting to bear down to look for anterior/posterior vaginal wall laxity (could be fear of releasing gas/urine)  04/13/23:               External Perineal Exam dryness, tenderness, labial  fusion bil                             Internal Pelvic Floor Pt has increased bil pelvic floor muscle tenderness and tightness; bladder restriction and reproduction of urgency with palpation; reproduction of lower abdominal pain with palpation of tight anterior Rt pelvic floor muscles  Patient confirms identification and approves PT to assess internal pelvic floor and treatment Yes   PELVIC MMT:    MMT 04/13/23  Vaginal 2/5, 5 second hold, 5 repeat contractions  (Blank rows = not tested)        TONE: High Bil  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, swelling of quadratus femoris muscle, tendinopathic and frayed hamstring origin Scheduled for anal manometry in April 2025   COGNITION: Overall cognitive status: Within functional limits for tasks assessed     SENSATION: Light touch: Appears intact Proprioception: Appears intact   FUNCTIONAL/SPECIAL TESTS:  Single leg stance:  Rt: more stable, some Lt drop - compensated trendelenburg  Lt: less stable, more Rt drop - compensated trendelenburg Squat: Lt weight shift, Rt  valgus knee collapse FAIR: (+) Lt hip Hamstring length: significant decrease with pain on Lt, -75 degrees Curl-up test: breath holding, but no distortion, 2 finger width diastasis in upper abdominals Bed mobility: breath holding  Palpable transversus abdominus contraction bil, but weak Tenderness at Lt ischial tuberosity and hamstrings/Lt posterolateral hip mm  GAIT: Comments: WNL  POSTURE: forward head, decreased lumbar lordosis, posterior pelvic tilt, and Lt lower thoracic/upper lumbar curvature, elevated Lt iliac crest  PELVIC ALIGNMENT: Lt posterior rotation, Lt sacral facing rotation   LUMBARAROM/PROM:  A/PROM A/PROM  Eval (% available)  Flexion 75, pain on LT  Extension 75  Right lateral flexion 100  Left lateral flexion 100, pain in back and Lt pelvis  Right rotation 75  Left rotation 50, feels good on back   (Blank rows = not tested)   LOWER EXTREMITY MMT:  MMT Right eval Left eval  Hip abduction 5/5 5/5  Hip adduction    Hip internal rotation 3/5 4-/5  Hip external rotation 3/5 4/5   PALPATION:   General: Large mass located in Rt abdomen lateral to umbilicus, non-tender, but with pressure it caused discomfort in low back - patient had not noticed before and reports no doctor has mentioned to her before    TODAY'S TREATMENT:  DATE:  05/11/23 Manual: ILU massage Bowel mobilization Bladder mobilization externally Abdominal soft tissue mobilization  Neuromuscular re-education: Neuromodulation for pain with education on TENs unit - we discussed which one may be most beneficial  Diaphragmatic breathing Supine with 10lb wt: 3 seconds x 4, 4 seconds x 4, 5 seconds x 4 Supine green band around lower rib cage: 3 seconds x 4, 4 seconds x 4, 5 seconds x 4   05/09/23 Neuromuscular re-education: Pt education on TENs and we used her unit - some  difficulty with her unit and getting it to work; discussed electrode placement External palpation of pelvic floor muscle contraction and bearing down Therapeutic activities: Discussed what may have happened with significant pressure to abdomen with small ball in prone - may have put pressure on bladder and uterine ligaments Inverted lying position    05/04/23 Manual: Abdominal soft tissue mobilization External bladder mobilization Abdominal/bladder skin rolling Negative pressure soft tissue mobilization   PATIENT EDUCATION:  Education details: See above Person educated: Patient Education method: Programmer, multimedia, Demonstration, Tactile cues, Verbal cues, and Handouts Education comprehension: verbalized understanding  HOME EXERCISE PROGRAM: [email or web address]  ASSESSMENT:  CLINICAL IMPRESSION: Pt is doing better with pain today, but has continued to have some of the sharp pain that developed in her last work out class. We worked a simplified TENs unit today so she could see that it does not have to be an expensive unit in order to help with pain and provide some benefit. We used a unit that she could easily perform posterior tibialis protocol with to help improve bladder urgency and discomfort. She responded well to all abdominal techniques today with improved comfort at end of session. She did very well with diaphragmatic breathing techniques to help improve rib cage excursion. She will continue to benefit from skilled PT intervention in order to decrease pelvic pain, improve low back/Lt hip pain,decrease urinary urgency, decrease lower abdominal pain, and begin/progress functional strengthening program.   OBJECTIVE IMPAIRMENTS: decreased activity tolerance, decreased coordination, decreased endurance, decreased mobility, decreased strength, increased fascial restrictions, increased muscle spasms, impaired tone, postural dysfunction, and pain.   ACTIVITY LIMITATIONS: lifting, bending, sitting,  squatting, and bed mobility  PARTICIPATION LIMITATIONS: cleaning, driving, and community activity  PERSONAL FACTORS: 1 comorbidity: medical history  are also affecting patient's functional outcome.   REHAB POTENTIAL: Good  CLINICAL DECISION MAKING: Evolving/moderate complexity  EVALUATION COMPLEXITY: Moderate   GOALS: Goals reviewed with patient? Yes  SHORT TERM GOALS: Updated 05/09/23  Pt will be independent with HEP.   Baseline: Goal status: MET 04/13/23  2.  Pt will be able to teach back and utilize urge suppression technique in order to help reduce number of trips to the bathroom.    Baseline:  Goal status: MET 05/09/23  3.  Pt will begin use of pelvic floor muscle wand on regular basis (2-3x/week) in order to help reduce pelvic floor muscle tension and pain. Baseline:  Goal status: IN PROGRESS 05/09/23  4.  Pt will be independent with diaphragmatic breathing and down training activities in order to improve pelvic floor relaxation.  Baseline:  Goal status: MET 05/09/23  5.  Pt will report pelvic pain no greater than 4/10. Baseline:  Goal status: MET 05/09/23   LONG TERM GOALS: Updated 05/09/23  Pt will be independent with advanced HEP.   Baseline:  Goal status: IN PROGRESS 05/09/23  2.  Pt will be able to go 2-3 hours in between voids without urgency or incontinence in order to improve QOL  and perform all functional activities with less difficulty.   Baseline:  Goal status: IN PROGRESS 05/09/23  3.  Pt will report pelvic pain no greater than 2/10.  Baseline:  Goal status: IN PROGRESS 05/09/23  4.  Pt will be able to sit for >30 minutes without any increased pelvic pain.  Baseline:  Goal status: IN PROGRESS 05/09/23  5.  Pt will be able to stand on single leg without pelvic drop and squat without weight shift in order to demonstrate improved functional core and hip strength to more appropriately stabilize pelvis.  Baseline:  Goal status: IN PROGRESS  05/09/23  6.  Pt will demonstrate increase in all impaired hip strength by 1 muscle grades in order to demonstrate improved lumbopelvic support and increase functional ability.   Baseline:  Goal status: IN PROGRESS 05/09/23  PLAN:  PT FREQUENCY: 1-2x/week  PT DURATION: 12 weeks  PLANNED INTERVENTIONS: 97110-Therapeutic exercises, 97530- Therapeutic activity, 97112- Neuromuscular re-education, 97535- Self Care, [id number]- Manual therapy, Dry Needling, and Biofeedback  PLAN FOR NEXT SESSION: Plan to work on breathing techniques and manual techniques to abdomen   Verlena Glenn, PT, DPT05/01/2509:46 AM

## 2023-05-22 ENCOUNTER — Telehealth: Payer: Self-pay | Admitting: Gastroenterology

## 2023-05-22 DIAGNOSIS — K5902 Outlet dysfunction constipation: Secondary | ICD-10-CM

## 2023-05-22 DIAGNOSIS — R159 Full incontinence of feces: Secondary | ICD-10-CM

## 2023-05-22 DIAGNOSIS — K6289 Other specified diseases of anus and rectum: Secondary | ICD-10-CM

## 2023-05-22 NOTE — Telephone Encounter (Signed)
 Dr Leonia Raman-  Have you seen anorectal mano study on this patient?

## 2023-05-22 NOTE — Telephone Encounter (Signed)
 This pt last saw [facility] when anal rectal mano was ordered. Have any of you seen that result?

## 2023-05-22 NOTE — Telephone Encounter (Signed)
 PT is calling to get results of the anorectal manometry. Please advise.

## 2023-05-22 NOTE — Telephone Encounter (Signed)
 I do not see it being scanned, Jessica Miles will have a copy or I can printed a copy of the report.  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

## 2023-05-23 ENCOUNTER — Ambulatory Visit: Payer: Self-pay

## 2023-05-23 DIAGNOSIS — R279 Unspecified lack of coordination: Secondary | ICD-10-CM

## 2023-05-23 DIAGNOSIS — M5459 Other low back pain: Secondary | ICD-10-CM

## 2023-05-23 DIAGNOSIS — M25552 Pain in left hip: Secondary | ICD-10-CM

## 2023-05-23 DIAGNOSIS — M62838 Other muscle spasm: Secondary | ICD-10-CM | POA: Diagnosis not present

## 2023-05-23 DIAGNOSIS — M6281 Muscle weakness (generalized): Secondary | ICD-10-CM

## 2023-05-23 DIAGNOSIS — R102 Pelvic and perineal pain: Secondary | ICD-10-CM

## 2023-05-23 NOTE — Therapy (Signed)
 OUTPATIENT PHYSICAL THERAPY FEMALE PELVIC TREATMENT  Progress Note Reporting Period 02/16/23 to 05/11/23  See note below for Objective Data and Assessment of Progress/Goals.     Patient Name: Jessica Miles MRN: [id number] DOB:Feb 16, 1945, 78 y.o., female Today's Date: 05/23/2023  END OF SESSION:  PT End of Session - 05/23/23 0900     Visit Number 21    Date for PT Re-Evaluation 08/01/23    Authorization Type Medicare - needs Kx at 15    Progress Note Due on Visit 30    PT Start Time 0845    PT Stop Time 0925    PT Time Calculation (min) 40 min    Activity Tolerance Patient tolerated treatment well    Behavior During Therapy [facility] for tasks assessed/performed              Past Medical History:  Diagnosis Date   Allergy    Anxiety    COVID-19 virus infection    09-23-2019- 9-16 had monoclonal infusion    Depression    History of IBS    HPV (human papilloma virus) infection    Hyperlipidemia    82- not taking statin    Loss of taste    Neuropathy 2015   surgery for Mortons neuroma in 1985   Osteopenia    Osteoporosis    Raynaud's disease 02/2020   Tachycardia    Thyroid  disease    Past Surgical History:  Procedure Laterality Date   ANAL RECTAL MANOMETRY N/A 05/03/2023   Procedure: [name];  Surgeon: Normie Becton., MD;  Location: [facility];  Service: Gastroenterology;  Laterality: N/A;   BIOPSY  01/25/2022   Procedure: BIOPSY;  Surgeon: Normie Becton., MD;  Location: [facility];  Service: Gastroenterology;;   COLONOSCOPY  2019   hems only    ESOPHAGOGASTRODUODENOSCOPY (EGD) WITH PROPOFOL  N/A 01/25/2022   Procedure: ESOPHAGOGASTRODUODENOSCOPY (EGD) WITH PROPOFOL ;  Surgeon: Normie Becton., MD;  Location: [facility];  Service: Gastroenterology;  Laterality: N/A;   EUS N/A 01/25/2022   Procedure: UPPER ENDOSCOPIC ULTRASOUND (EUS) LINEAR;  Surgeon: Normie Becton., MD;  Location: [facility];  Service: Gastroenterology;   Laterality: N/A;   FOOT NEUROMA SURGERY  1985   WISDOM TOOTH EXTRACTION     Patient Active Problem List   Diagnosis Date Noted   Vertigo 03/10/2023   Right upper quadrant abdominal mass 01/26/2023   Panic attacks 10/26/2022   Allergic reaction to insect bite 06/23/2022   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   Severely underweight adult 09/07/2021   Acute diarrhea 04/27/2021   Inflamed sebaceous cyst 02/11/2021   Transient disorientation 10/20/2020   Memory change 08/05/2020   Family history of early CAD 02/27/2020   Tremor 02/27/2020   Aortic atherosclerosis (HCC) 12/19/2019   Urinary urgency 05/21/2019   Bladder irritation 05/21/2019   External hemorrhoid 05/21/2019   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   Raynaud's syndrome 02/09/2019   Osteoarthritis of thumb, 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   Fatigue 12/19/2017   Acute left-sided low back pain without sciatica 12/19/2017   SVT (supraventricular tachycardia) (HCC) 08/31/2017   Peripheral neuropathy 08/31/2017   Dry eyes 09/21/2015   Atopic dermatitis 03/13/2015   IBS (irritable bowel syndrome) 03/18/2014   Osteoporosis 03/18/2014   Major depressive disorder, recurrent episode, moderate (HCC) 03/18/2014   Hypothyroidism 12/14/2013    PCP: Judithann Novas, MD  REFERRING PROVIDER: Adan Holms, MD  REFERRING DIAG: M79.18 (ICD-10-CM) - Myalgia, other site G58.8 (ICD-10-CM) - Other specified mononeuropathies  THERAPY DIAG:  Other muscle spasm  Muscle weakness (generalized)  Pain in left hip  Other low back pain  Pelvic pain  Unspecified lack of coordination  Rationale for Evaluation and Treatment: Rehabilitation  ONSET DATE: 3 years   SUBJECTIVE:                                                                                                                                                                                           SUBJECTIVE STATEMENT: Pt has noticed that when she rides her bike she has numbness in pernium. Doctor has  recommended Gemtesa to see if it helps with IC symptoms.   PAIN: 05/23/23 Are you having pain? Yes NPRS scale: 1/10 (4/10 over the weekend) Pain location: Lt hip and perineum/vulva; lower abdomen discomfort  Pain type: soreness Pain description: intermittent   Aggravating factors: sitting Relieving factors: lying flat, sitting on toilet  PRECAUTIONS: Other: large mass in Rt lower quadrant that has not been treated by medical team  RED FLAGS: None   WEIGHT BEARING RESTRICTIONS: No  FALLS:  Has patient fallen in last 6 months? No  LIVING ENVIRONMENT: Lives with: lives with their family Lives in: House/apartment  OCCUPATION: retired Runner, broadcasting/film/video  PLOF: Independent  PATIENT GOALS: decrease pain  PERTINENT HISTORY:  Interstitial cystitis   Updated 04/13/23:  BOWEL MOVEMENT: Pain with bowel movement: No Type of bowel movement:Frequency up to 5x/hour anywhere from solid to diarrhea; she will go 1-2 hours after eating on average and Strain No Fully empty rectum: No Leakage: Yes: seed size stool can come out without sensation when walking   Pads: No Fiber supplement: No  URINATION: Pain with urination: No Fully empty bladder: Yes: - Stream: Weak Urgency: No - but not drinking much and goes every time she walks by the bathroom  Frequency: as often as she can - very small amounts  Leakage: none Pads: No  INTERCOURSE: NA  PREGNANCY: Vaginal deliveries 2 Tearing No C-section deliveries 0 Currently pregnant No  PROLAPSE: None   OBJECTIVE:  Note: Objective measures were completed at Evaluation unless otherwise noted. 05/09/23: Grade 1 uterine ligament laxity Significant tenderness with palpation of cervix Significant improvement with paradoxical contraction - not present anymore with lift when squeezing and appropriate lengthening with bearing down    04/25/23: Grade 1 uterine ligament laxity Significant tenderness with palpation of cervix Paradoxical pelvic  floor muscle contraction with attempting to bear down to look for anterior/posterior vaginal wall laxity (could be fear of releasing gas/urine)  04/13/23:               External Perineal Exam dryness, tenderness, labial fusion bil  Internal Pelvic Floor Pt has increased bil pelvic floor muscle tenderness and tightness; bladder restriction and reproduction of urgency with palpation; reproduction of lower abdominal pain with palpation of tight anterior Rt pelvic floor muscles  Patient confirms identification and approves PT to assess internal pelvic floor and treatment Yes   PELVIC MMT:    MMT 04/13/23  Vaginal 2/5, 5 second hold, 5 repeat contractions  (Blank rows = not tested)        TONE: High Bil  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, swelling of quadratus femoris muscle, tendinopathic and frayed hamstring origin Scheduled for anal manometry in April 2025   COGNITION: Overall cognitive status: Within functional limits for tasks assessed     SENSATION: Light touch: Appears intact Proprioception: Appears intact   FUNCTIONAL/SPECIAL TESTS:  Single leg stance:  Rt: more stable, some Lt drop - compensated trendelenburg  Lt: less stable, more Rt drop - compensated trendelenburg Squat: Lt weight shift, Rt valgus knee collapse FAIR: (+) Lt  hip Hamstring length: significant decrease with pain on Lt, -75 degrees Curl-up test: breath holding, but no distortion, 2 finger width diastasis in upper abdominals Bed mobility: breath holding  Palpable transversus abdominus contraction bil, but weak Tenderness at Lt ischial tuberosity and hamstrings/Lt posterolateral hip mm  GAIT: Comments: WNL  POSTURE: forward head, decreased lumbar lordosis, posterior pelvic tilt, and Lt lower thoracic/upper lumbar curvature, elevated Lt iliac crest  PELVIC ALIGNMENT: Lt posterior rotation, Lt sacral facing rotation   LUMBARAROM/PROM:  A/PROM A/PROM  Eval (% available)  Flexion 75, pain on LT  Extension 75  Right lateral flexion 100  Left lateral flexion 100, pain in back and Lt pelvis  Right rotation 75  Left rotation 50, feels good on back   (Blank rows = not tested)   LOWER EXTREMITY MMT:  MMT Right eval Left eval  Hip abduction 5/5 5/5  Hip adduction    Hip internal rotation 3/5 4-/5  Hip external rotation 3/5 4/5   PALPATION:   General: Large mass located in Rt abdomen lateral to umbilicus, non-tender, but with pressure it caused discomfort in low back - patient had not noticed before and reports no doctor has mentioned to her before    TODAY'S TREATMENT:  DATE:  05/23/23 Manual: ILU massage Bowel mobilization Bladder mobilization externally Abdominal soft tissue mobilization  Therapeutic activities: Pt education on IC flare ups and how to manage - learning her symptoms of flare ups and when she may need a little more intervention She was encouraged to follow MD instruction for use of estrogen cream - believe it will really help with her pain and symptoms  Education on adding more cushion to bike seat - numbness sounds like mechanical compression Pt education performed on the amount of stool that  is present in bowel and how there is stool sitting in anus due to decreased sensation and RAIR reflex   05/11/23 Manual: ILU massage Bowel mobilization Bladder mobilization externally Abdominal soft tissue mobilization  Neuromuscular re-education: Neuromodulation for pain with education on TENs unit - we discussed which one may be most beneficial  Diaphragmatic breathing Supine with 10lb wt: 3 seconds x 4, 4 seconds x 4, 5 seconds x 4 Supine green band around lower rib cage: 3 seconds x 4, 4 seconds x 4, 5 seconds x 4   05/09/23 Neuromuscular re-education: Pt education on TENs and we used her unit - some difficulty with her unit and getting it to work; discussed electrode placement External palpation of pelvic floor muscle contraction and bearing down Therapeutic activities: Discussed what may have happened with significant pressure to abdomen with small ball in prone - may have put pressure on bladder and uterine ligaments Inverted lying position    PATIENT EDUCATION:  Education details: See above Person educated: Patient Education method: Explanation, Demonstration, Tactile cues, Verbal cues, and Handouts Education comprehension: verbalized understanding  HOME EXERCISE PROGRAM: [id number]  ASSESSMENT:  CLINICAL IMPRESSION: Pt continuing to have bladder pain, but her exacerbation from doing core workout has resolved. We discussed how full her bowel felt with stool today and that her anal sensation and RAIR reflex is likely decreased from chronic constipation. This is likely why she has small amounts of stool that just sit in her anus. Even though she has not received results from her anal manometry, believe it will show paradoxical  contraction that we have palpated; this may be a good direction to move towards in treatment in order to reduce the stool burden that is probably also irritating her bladder. Sinc ethe MD has prescribed estrogen cream, she was strongly encouraged to use  as directed and that this will help to improve her bladder irritation symptoms. She continues to respond very well to abdominal mobilization and bladder techniques. She will continue to benefit from skilled PT intervention in order to decrease pelvic pain, improve low back/Lt hip pain,decrease urinary urgency, decrease lower abdominal pain, and begin/progress functional strengthening program.   OBJECTIVE IMPAIRMENTS: decreased activity tolerance, decreased coordination, decreased endurance, decreased mobility, decreased strength, increased fascial restrictions, increased muscle spasms, impaired tone, postural dysfunction, and pain.   ACTIVITY LIMITATIONS: lifting, bending, sitting, squatting, and bed mobility  PARTICIPATION LIMITATIONS: cleaning, driving, and community activity  PERSONAL FACTORS: 1 comorbidity: medical history are also affecting patient's functional outcome.   REHAB POTENTIAL: Good  CLINICAL DECISION MAKING: Evolving/moderate complexity  EVALUATION COMPLEXITY: Moderate   GOALS: Goals reviewed with patient? Yes  SHORT TERM GOALS: Updated 05/09/23  Pt will be independent with HEP.   Baseline: Goal status: MET 04/13/23  2.  Pt will be able to teach back and utilize urge suppression technique in order to help reduce number of trips to the bathroom.    Baseline:  Goal status: MET 05/09/23  3.  Pt will begin use of pelvic floor muscle wand on regular basis (2-3x/week) in order to help reduce pelvic floor muscle tension and pain. Baseline:  Goal status: IN PROGRESS 05/09/23  4.  Pt will be independent with diaphragmatic breathing and down training activities in order to improve pelvic floor relaxation.  Baseline:  Goal status: MET 05/09/23  5.  Pt will report pelvic pain no greater than 4/10. Baseline:  Goal status: MET 05/09/23   LONG TERM GOALS: Updated 05/09/23  Pt will be independent with advanced HEP.   Baseline:  Goal status: IN PROGRESS 05/09/23  2.  Pt  will be able to go 2-3 hours in between voids without urgency or incontinence in order to improve QOL and perform all functional activities with less difficulty.   Baseline:  Goal status: IN PROGRESS 05/09/23  3.  Pt will report pelvic pain no greater than 2/10.  Baseline:  Goal status: IN PROGRESS 05/09/23  4.  Pt will be able to sit for >30 minutes without any increased pelvic pain.  Baseline:  Goal status: IN PROGRESS 05/09/23  5.  Pt will be able to stand on single leg without pelvic drop and squat without weight shift in order to demonstrate improved functional core and hip strength to more appropriately stabilize pelvis.  Baseline:  Goal status: IN PROGRESS 05/09/23  6.  Pt will demonstrate increase in all impaired hip strength by 1 muscle grades in order to demonstrate improved lumbopelvic support and increase functional ability.   Baseline:  Goal status: IN PROGRESS 05/09/23  PLAN:  PT FREQUENCY: 1-2x/week  PT DURATION: 12 weeks  PLANNED INTERVENTIONS: 97110-Therapeutic exercises, 97530- Therapeutic activity, 97112- Neuromuscular re-education, 97535- Self Care, [id number]- Manual therapy, Dry Needling, and Biofeedback  PLAN FOR NEXT SESSION: Plan to work on breathing techniques and manual techniques to abdomen   Verlena Glenn, PT, DPT05/13/259:32 AM

## 2023-05-23 NOTE — Telephone Encounter (Signed)
 Left message on voicemail that I have made results/recommendations available on mychart. Asked that patient reach out with any additional questions or concerns.

## 2023-05-23 NOTE — Telephone Encounter (Signed)
 22 pages of referral/medical records faxed to [facility].  Electronic referral placed in EPIC for pelvic floor PT.

## 2023-05-26 NOTE — Telephone Encounter (Signed)
===  View-only below this line=== ----- Message ----- From: Normie Becton., MD Sent: 05/26/2023   7:08 AM EDT To: Glennette Lanius, RN Subject: RE:                                            Clinic visit is OK. Please schedule as I have availability. GM

## 2023-05-30 ENCOUNTER — Ambulatory Visit

## 2023-05-30 DIAGNOSIS — R279 Unspecified lack of coordination: Secondary | ICD-10-CM

## 2023-05-30 DIAGNOSIS — M25552 Pain in left hip: Secondary | ICD-10-CM

## 2023-05-30 DIAGNOSIS — R293 Abnormal posture: Secondary | ICD-10-CM

## 2023-05-30 DIAGNOSIS — M6281 Muscle weakness (generalized): Secondary | ICD-10-CM

## 2023-05-30 DIAGNOSIS — M62838 Other muscle spasm: Secondary | ICD-10-CM

## 2023-05-30 DIAGNOSIS — M5459 Other low back pain: Secondary | ICD-10-CM

## 2023-05-30 DIAGNOSIS — R102 Pelvic and perineal pain: Secondary | ICD-10-CM
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
## 2023-05-31 NOTE — Telephone Encounter (Signed)
 Patient is scheduled to see Dr Camilo Cella with [facility] on 07/18/23.

## 2023-06-06 ENCOUNTER — Ambulatory Visit: Payer: Self-pay
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
## 2023-06-06 NOTE — Telephone Encounter (Signed)
 Appointment with Dr. Cherlyn Cornet 06/07/2023 at 11:00 am.

## 2023-06-07 ENCOUNTER — Ambulatory Visit (INDEPENDENT_AMBULATORY_CARE_PROVIDER_SITE_OTHER): Admitting: Family Medicine

## 2023-06-07 ENCOUNTER — Encounter: Payer: Self-pay | Admitting: Family Medicine

## 2023-06-07 VITALS — BP 126/82 | HR 75 | Temp 97.9°F | Ht 66.0 in | Wt 108.0 lb

## 2023-06-07 DIAGNOSIS — N9089 Other specified noninflammatory disorders of vulva and perineum: Secondary | ICD-10-CM | POA: Insufficient documentation

## 2023-06-07 DIAGNOSIS — R102 Pelvic and perineal pain: Secondary | ICD-10-CM | POA: Insufficient documentation
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
## 2023-06-07 NOTE — Progress Notes (Signed)
 Patient ID: Jessica Miles, female    DOB: 1945/05/25, 78 y.o.   MRN: [id number]  This visit was conducted in person.  BP 126/82   Pulse 75   Temp 97.9 F (36.6 C) (Oral)   Ht 5\' 6"  (1.676 m)   Wt 108 lb (49 kg)   SpO2 97%   BMI 17.43 kg/m    CC:  Chief Complaint  Patient presents with   Pain    Pain located in the perineum area about 3 weeks     Subjective:   HPI: Jessica Miles is a 78 y.o. 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 got a new bike seat 3 weeks ago.. riding daily.  Did feel numb in perineum after riding... sensation came back quickly. Skin is sensitive, feels swollen  No rash  Area around vagina and above rectum sore   Has started doing exercise ball home therapy.   She  is using estradiol  cream vaginally... She feels this is made the vaginal environment very moist and she feels this is contributing to irritation.  No burning to urination, no vaginal  pain  No pain with moving hips and back.   Has tried doing sitz bath, topical antibiotic treatment, topical steroid, topical lidocaine     History of IBS D, myalgia of pelvic floor, interstitial cystitis,  OAB  Relevant past medical, surgical, family and social history reviewed and updated as indicated. Interim medical history since our last visit reviewed. Allergies and medications reviewed and updated. Outpatient Medications Prior to Visit  Medication Sig Dispense Refill   alendronate  (FOSAMAX ) 70 MG tablet Take 1 tablet (70 mg total) by mouth every 7 (seven) days. Take with a full glass of water on an empty stomach. 4 tablet 11   ALPHA LIPOIC ACID PO Take 100 mg by mouth daily.     ascorbic acid (VITAMIN C) 500 MG tablet Take 500 mg by mouth 2 (two) times daily.      atenolol (TENORMIN) 25 MG tablet Take 25 mg by mouth daily as needed  (palpitations/pulse racing).     b complex vitamins capsule Take 1 capsule by mouth daily.     buPROPion  (WELLBUTRIN  XL) 150 MG 24 hr tablet Take 1 tablet (150 mg total) by mouth daily. 90 tablet 1   busPIRone  (BUSPAR ) 5 MG tablet Take 1 tablet (5 mg total) by mouth 2 (two) times daily as needed (anxiety). 180 tablet 0   Calcium  Carb-Cholecalciferol (CALCIUM  600 + D PO) Take 1 tablet by mouth in the morning. 600 mg /400 mcg     Cholecalciferol (VITAMIN D -3 PO) Take 5,000 Units by mouth in the morning.     colestipol  (COLESTID ) 1 g tablet Take 2 tablets (2 g total) by mouth 2 (two) times daily. 360 tablet 3   esomeprazole  (NEXIUM ) 40 MG capsule Take 40 mg by mouth daily as needed.     estradiol  (ESTRACE ) 0.1 MG/GM vaginal cream 0.5 g of cream intravaginally administered daily at bedtime for 2 weeks, then reduce to twice weekly 42.5 g 12   hydrocortisone -pramoxine (ANALPRAM-HC) 2.5-1 % rectal cream Place 1 Application rectally as needed for hemorrhoids or anal itching. 30 g 1   ketoconazole  (NIZORAL ) 2 % cream Apply 1 Application topically 2 (two) times daily as needed for irritation.  Apply between the third  and fourth toe twice a day as needed     levothyroxine  (SYNTHROID ) 100 MCG tablet Take 1 tablet (100 mcg total) by mouth daily.     Lifitegrast (XIIDRA) 5 % SOLN Place 1 drop into both eyes 2 (two) times daily.     MAGNESIUM  PO Take 125 mg by mouth daily.     Methylcellulose, Laxative, (CITRUCEL PO) Take 1 Dose by mouth 2 (two) times daily between meals as needed.     MULTIPLE VITAMIN PO Take 1 tablet by mouth in the morning.     mupirocin  cream (BACTROBAN ) 2 % Apply 1 Application topically 2 (two) times daily as needed.     nitroGLYCERIN  (NITRODUR - DOSED IN MG/24 HR) 0.2 mg/hr patch Place 0.2 mg onto the skin daily as needed.     triamcinolone  cream (KENALOG ) 0.5 % Apply 1 Application topically 2 (two) times daily as needed.     TYRVAYA 0.03 MG/ACT SOLN Place 1 spray into both nostrils daily  as needed (dry eyes).     No facility-administered medications prior to visit.     Per HPI unless specifically indicated in ROS section below Review of Systems  Constitutional:  Negative for fatigue and fever.  HENT:  Negative for ear pain.   Eyes:  Negative for pain.  Respiratory:  Negative for chest tightness and shortness of breath.   Cardiovascular:  Negative for chest pain, palpitations and leg swelling.  Gastrointestinal:  Negative for abdominal pain.  Genitourinary:  Negative for dysuria.   Objective:  BP 126/82   Pulse 75   Temp 97.9 F (36.6 C) (Oral)   Ht 5\' 6"  (1.676 m)   Wt 108 lb (49 kg)   SpO2 97%   BMI 17.43 kg/m   Wt Readings from Last 3 Encounters:  06/07/23 108 lb (49 kg)  03/10/23 109 lb 4 oz (49.6 kg)  01/25/23 110 lb (49.9 kg)      Physical Exam Exam conducted with a chaperone present.  Constitutional:      General: She is not in acute distress.    Appearance: Normal appearance. She is well-developed. She is not ill-appearing or toxic-appearing.  HENT:     Head: Normocephalic.     Right Ear: Hearing, tympanic membrane, ear canal and external ear normal. Tympanic membrane is not erythematous, retracted or bulging.     Left Ear: Hearing, tympanic membrane, ear canal and external ear normal. Tympanic membrane is not erythematous, retracted or bulging.     Nose: No mucosal edema or rhinorrhea.     Right Sinus: No maxillary sinus tenderness or frontal sinus tenderness.     Left Sinus: No maxillary sinus tenderness or frontal sinus tenderness.     Mouth/Throat:     Pharynx: Uvula midline.  Eyes:     General: Lids are normal. Lids are everted, no foreign bodies appreciated.     Conjunctiva/sclera: Conjunctivae normal.     Pupils: Pupils are equal, round, and reactive to light.  Neck:     Thyroid : No thyroid  mass or thyromegaly.     Vascular: No carotid bruit.     Trachea: Trachea normal.  Cardiovascular:     Rate and Rhythm: Normal rate and regular  rhythm.     Pulses: Normal pulses.     Heart sounds: Normal heart sounds, S1 normal and S2 normal. No murmur heard.    No friction rub. No gallop.  Pulmonary:     Effort: Pulmonary effort is normal. No tachypnea or respiratory distress.  Breath sounds: Normal breath sounds. No decreased breath sounds, wheezing, rhonchi or rales.  Abdominal:     General: Bowel sounds are normal.     Palpations: Abdomen is soft.     Tenderness: There is no abdominal tenderness.     Hernia: There is no hernia in the left inguinal area or right inguinal area.  Genitourinary:    Exam position: Supine.     Labia:        Right: Tenderness present. No rash, lesion or injury.        Left: Tenderness present. No rash, lesion or injury.   Musculoskeletal:     Cervical back: Normal range of motion and neck supple.  Lymphadenopathy:     Lower Body: No right inguinal adenopathy. No left inguinal adenopathy.  Skin:    General: Skin is warm and dry.     Findings: No rash.  Neurological:     Mental Status: She is alert.  Psychiatric:        Mood and Affect: Mood is not anxious or depressed.        Speech: Speech normal.        Behavior: Behavior normal. Behavior is cooperative.        Thought Content: Thought content normal.        Judgment: Judgment normal.       Results for orders placed or performed in visit on 03/10/23  Vitamin B12   Collection Time: 03/10/23  3:25 PM  Result Value Ref Range   Vitamin B-12 1,001 200 - 1,100 pg/mL  TSH   Collection Time: 03/10/23  3:25 PM  Result Value Ref Range   TSH 1.10 0.40 - 4.50 mIU/L  CBC with Differential/Platelet   Collection Time: 03/10/23  3:25 PM  Result Value Ref Range   WBC 5.8 3.8 - 10.8 Thousand/uL   RBC 4.61 3.80 - 5.10 Million/uL   Hemoglobin 14.0 11.7 - 15.5 g/dL   HCT 95.6 21.3 - 08.6 %   MCV 92.8 80.0 - 100.0 fL   MCH 30.4 27.0 - 33.0 pg   MCHC 32.7 32.0 - 36.0 g/dL   RDW 57.8 46.9 - 62.9 %   Platelets 257 140 - 400 Thousand/uL   MPV  11.3 7.5 - 12.5 fL   Neutro Abs 3,109 1,500 - 7,800 cells/uL   Absolute Lymphocytes 2,140 850 - 3,900 cells/uL   Absolute Monocytes 429 200 - 950 cells/uL   Eosinophils Absolute 70 15 - 500 cells/uL   Basophils Absolute 52 0 - 200 cells/uL   Neutrophils Relative % 53.6 %   Total Lymphocyte 36.9 %   Monocytes Relative 7.4 %   Eosinophils Relative 1.2 %   Basophils Relative 0.9 %  Comprehensive metabolic panel   Collection Time: 03/10/23  3:25 PM  Result Value Ref Range   Glucose, Bld 83 65 - 99 mg/dL   BUN 22 7 - 25 mg/dL   Creat 5.28 (H) 4.13 - 1.00 mg/dL   BUN/Creatinine Ratio 21 6 - 22 (calc)   Sodium 141 135 - 146 mmol/L   Potassium 4.0 3.5 - 5.3 mmol/L   Chloride 104 98 - 110 mmol/L   CO2 24 20 - 32 mmol/L   Calcium  9.5 8.6 - 10.4 mg/dL   Total Protein 6.9 6.1 - 8.1 g/dL   Albumin 4.5 3.6 - 5.1 g/dL   Globulin 2.4 1.9 - 3.7 g/dL (calc)   AG Ratio 1.9 1.0 - 2.5 (calc)   Total Bilirubin 0.7 0.2 - 1.2  mg/dL   Alkaline phosphatase (APISO) 67 37 - 153 U/L   AST 25 10 - 35 U/L   ALT 30 (H) 6 - 29 U/L    Assessment and Plan  Labial irritation Assessment & Plan: Acute, normal exam.  Needed symptoms or change suggestive of bacterial, yeast infection.  No fissure, abrasion or abscess.  Recent symptoms may be secondary to irritation with frequent bike riding with different seat as well as increased moisture secondary to recent restart of Estrace  cream.  She has tried multiple treatments to date sitz bath, hydrocortisone , lidocaine  gel etc.  She will hold off on treating with any vaginal topical medications given this could be further irritating the area.  Send and symptoms of urinary tract infection.  Patient does have tenderness over the dental nerve and pelvic bone on the left to palpation but this is chronic for her and not related symptoms.  Return and ER precautions provided     No follow-ups on file.   Herby Lolling, MD

## 2023-06-09 ENCOUNTER — Encounter: Payer: Self-pay | Admitting: Diagnostic Neuroimaging

## 2023-06-09 ENCOUNTER — Telehealth: Payer: Self-pay | Admitting: Diagnostic Neuroimaging

## 2023-06-09 ENCOUNTER — Ambulatory Visit (INDEPENDENT_AMBULATORY_CARE_PROVIDER_SITE_OTHER): Payer: Medicare Other | Admitting: Diagnostic Neuroimaging

## 2023-06-09 VITALS — BP 107/62 | HR 67 | Ht 66.0 in | Wt 108.2 lb

## 2023-06-09 DIAGNOSIS — N951 Menopausal and female climacteric states: Secondary | ICD-10-CM | POA: Insufficient documentation

## 2023-06-09 DIAGNOSIS — D3613 Benign neoplasm of peripheral nerves and autonomic nervous system of lower limb, including hip: Secondary | ICD-10-CM | POA: Insufficient documentation

## 2023-06-09 DIAGNOSIS — H2510 Age-related nuclear cataract, unspecified eye: Secondary | ICD-10-CM | POA: Insufficient documentation

## 2023-06-09 DIAGNOSIS — Z23 Encounter for immunization: Secondary | ICD-10-CM | POA: Insufficient documentation

## 2023-06-09 DIAGNOSIS — Z0189 Encounter for other specified special examinations: Secondary | ICD-10-CM | POA: Insufficient documentation

## 2023-06-09 DIAGNOSIS — R200 Anesthesia of skin: Secondary | ICD-10-CM

## 2023-06-09 DIAGNOSIS — E0580 Other thyrotoxicosis without thyrotoxic crisis or storm: Secondary | ICD-10-CM | POA: Insufficient documentation

## 2023-06-09 DIAGNOSIS — L989 Disorder of the skin and subcutaneous tissue, unspecified: Secondary | ICD-10-CM | POA: Insufficient documentation

## 2023-06-09 DIAGNOSIS — I499 Cardiac arrhythmia, unspecified: Secondary | ICD-10-CM | POA: Insufficient documentation

## 2023-06-09 DIAGNOSIS — R202 Paresthesia of skin: Secondary | ICD-10-CM

## 2023-06-09 DIAGNOSIS — M6281 Muscle weakness (generalized): Secondary | ICD-10-CM | POA: Insufficient documentation

## 2023-06-09 DIAGNOSIS — Z760 Encounter for issue of repeat prescription: Secondary | ICD-10-CM | POA: Insufficient documentation

## 2023-06-09 DIAGNOSIS — R030 Elevated blood-pressure reading, without diagnosis of hypertension: Secondary | ICD-10-CM | POA: Insufficient documentation

## 2023-06-09 DIAGNOSIS — R899 Unspecified abnormal finding in specimens from other organs, systems and tissues: Secondary | ICD-10-CM | POA: Insufficient documentation

## 2023-06-09 DIAGNOSIS — G4769 Other sleep related movement disorders: Secondary | ICD-10-CM | POA: Insufficient documentation

## 2023-06-09 NOTE — Patient Instructions (Signed)
 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

## 2023-06-09 NOTE — Progress Notes (Signed)
 [facility]  PATIENT: Jessica Miles DOB: 07-11-1945  REFERRING CLINICIAN: Judithann Novas, MD HISTORY FROM: patient  REASON FOR VISIT: follow up   HISTORICAL  CHIEF COMPLAINT:  Chief Complaint  Patient presents with   RM6/Tingling    Pt is here Alone. Pt states that her tingling and cold sensation started after she had Covid 4 years ago. Pt states that the tingling and cold sensation is getting worse. Pt states that her cold sensation is in her abdomen and sometimes in her legs. Pt states that when she works out her symptoms get worse. Pt states that her hands and her feet will get cold and be blue tent. Pt would like to know what this is and what nerve is firing off.     HISTORY OF PRESENT ILLNESS:   UPDATE (06/09/23, VRP): Since last visit, continues with numbness, chill sensations, in torso, low back. Also intermittent color change of feet and hands (blue, sometimes white; not triggered by environmental temp). Symptoms worse with exercise and activity. Had [facility] of lumbar spine per [facility] (unremarkable per patient).   UPDATE (10/20/21, VRP): Since last visit, doing about the same. Some intermittent chill sensation in back and abdomen (2-5 minutes; 1-3 x per day; since 2021). Started after COVID infx. No pain. No triggers.  UPDATE 12/23/20: 78 year old female here for evaluation of transient confusion.  07/25/2020 patient was riding her bike on a 20 mile route, when halfway through she felt confused and could not recognize where she was.  This lasted for a few minutes and then resolved.  She had a similar event in 2020 when she was out walking in her neighborhood, could not recognize her surroundings and then it resolved.  No loss of consciousness.  No weakness, facial droop, headaches, vision changes.  UPDATE 11/11/15: Since last visit, in last 3 months, more symptoms are starting. Now with muscle weakness in wrist, ankles, intermittent muscle twitching. More burning  sensation in tongue and tingling in face. More burning in feet. Had event where she stood up and could move her legs. Had intermittent right foot/ankle inversion. Intermittent toes cramping.   UPDATE 01/20/14: Since last visit, foot numbness has slightly improved again. Taste loss is stable. Going to see [facility] smell/taste clinic in a few months. IBS symptoms stable.    UPDATE 10/16/13: Since last visit, numbness in feet slightly improved; continues on ALA, benfotamine, B12 supplements. Also has new problem, consistenting of reduced taste perception. Especially diff with sweet and sour. Smell is slightly reduced. She noticed this 3 weeks ago.    PRIOR HPI (08/20/13): 78 year old right-handed female with anxiety, tachycardia, hypothyroidism, IBS, here for evaluation of neuropathy. 5 months ago patient had significant increase in physical activity with increased exercise on a daily basis. End of June 2015 patient also started playing tennis. Within a few weeks patient developed numbness in her toes and feet. She felt a "pad sensation" on the balls of her feet. Patient also was developing some cramps in her calves. Patient went to PCP and podiatry, treated with metatarsal pads, cortisone shots in the feet, started on vitamin supplementation. Over the past few weeks patient symptoms have slightly improved. She still has some numbness on the bottom of her feet as well as some intermittent cramps. Patient is also to try to reduce her physical activity. Patient had blood testing by PCP with normal B12 level, and slightly low TSH. Patient also reports remote history of right foot Morton's neuroma status post resection  in 1985. Patient denies any neck or low back pain. No problems with her fingers or hands. No vision changes, slurred speech trouble talking headaches.   REVIEW OF SYSTEMS: Full 14 system review of systems performed and negative with exception of: as per HPI.  ALLERGIES: Allergies  Allergen Reactions    Fluticasone Other (See Comments)    Nose bleeds   Mometasone Other (See Comments)    Epistaxis   Fexofenadine Palpitations    HOME MEDICATIONS: Outpatient Medications Prior to Visit  Medication Sig Dispense Refill   ALPHA LIPOIC ACID PO Take 100 mg by mouth daily.     ascorbic acid (VITAMIN C) 500 MG tablet Take 500 mg by mouth 2 (two) times daily.      atenolol (TENORMIN) 25 MG tablet Take 25 mg by mouth daily as needed (palpitations/pulse racing).     b complex vitamins capsule Take 1 capsule by mouth daily.     buPROPion  (WELLBUTRIN  XL) 150 MG 24 hr tablet Take 1 tablet (150 mg total) by mouth daily. 90 tablet 1   Calcium  Carb-Cholecalciferol (CALCIUM  600 + D PO) Take 1 tablet by mouth in the morning. 600 mg /400 mcg     Cholecalciferol (VITAMIN D -3 PO) Take 5,000 Units by mouth in the morning.     levothyroxine  (SYNTHROID ) 100 MCG tablet Take 1 tablet (100 mcg total) by mouth daily.     Lifitegrast (XIIDRA) 5 % SOLN Place 1 drop into both eyes 2 (two) times daily.     MAGNESIUM  PO Take 125 mg by mouth daily.     Methylcellulose, Laxative, (CITRUCEL PO) Take 1 Dose by mouth 2 (two) times daily between meals as needed.     MULTIPLE VITAMIN PO Take 1 tablet by mouth in the morning.     alendronate  (FOSAMAX ) 70 MG tablet Take 1 tablet (70 mg total) by mouth every 7 (seven) days. Take with a full glass of water on an empty stomach. (Patient not taking: Reported on 06/09/2023) 4 tablet 11   busPIRone  (BUSPAR ) 5 MG tablet Take 1 tablet (5 mg total) by mouth 2 (two) times daily as needed (anxiety). (Patient not taking: Reported on 06/09/2023) 180 tablet 0   colestipol  (COLESTID ) 1 g tablet Take 2 tablets (2 g total) by mouth 2 (two) times daily. (Patient not taking: Reported on 06/09/2023) 360 tablet 3   esomeprazole  (NEXIUM ) 40 MG capsule Take 40 mg by mouth daily as needed. (Patient not taking: Reported on 06/09/2023)     estradiol  (ESTRACE ) 0.1 MG/GM vaginal cream 0.5 g of cream intravaginally  administered daily at bedtime for 2 weeks, then reduce to twice weekly (Patient not taking: Reported on 06/09/2023) 42.5 g 12   hydrocortisone -pramoxine (ANALPRAM-HC) 2.5-1 % rectal cream Place 1 Application rectally as needed for hemorrhoids or anal itching. (Patient not taking: Reported on 06/09/2023) 30 g 1   ketoconazole  (NIZORAL ) 2 % cream Apply 1 Application topically 2 (two) times daily as needed for irritation.  Apply between the third and fourth toe twice a day as needed (Patient not taking: Reported on 06/09/2023)     mupirocin  cream (BACTROBAN ) 2 % Apply 1 Application topically 2 (two) times daily as needed. (Patient not taking: Reported on 06/09/2023)     nitroGLYCERIN  (NITRODUR - DOSED IN MG/24 HR) 0.2 mg/hr patch Place 0.2 mg onto the skin daily as needed. (Patient not taking: Reported on 06/09/2023)     triamcinolone  cream (KENALOG ) 0.5 % Apply 1 Application topically 2 (two) times daily as  needed. (Patient not taking: Reported on 06/09/2023)     TYRVAYA 0.03 MG/ACT SOLN Place 1 spray into both nostrils daily as needed (dry eyes). (Patient not taking: Reported on 06/09/2023)     No facility-administered medications prior to visit.    PAST MEDICAL HISTORY: Past Medical History:  Diagnosis Date   Allergy    Anxiety    COVID-19 virus infection    09-23-2019- 9-16 had monoclonal infusion    Depression    History of IBS    HPV (human papilloma virus) infection    Hyperlipidemia    83- not taking statin    Loss of taste    Neuropathy 2015   surgery for Mortons neuroma in 1985   Osteopenia    Osteoporosis    Raynaud's disease 02/2020   Tachycardia    Thyroid  disease     PAST SURGICAL HISTORY: Past Surgical History:  Procedure Laterality Date   ANAL RECTAL MANOMETRY N/A 05/03/2023   Procedure: MANOMETRY, ANORECTAL;  Surgeon: Normie Becton., MD;  Location: [facility];  Service: Gastroenterology;  Laterality: N/A;   BIOPSY  01/25/2022   Procedure: BIOPSY;  Surgeon:  Normie Becton., MD;  Location: [facility];  Service: Gastroenterology;;   COLONOSCOPY  2019   hems only    ESOPHAGOGASTRODUODENOSCOPY (EGD) WITH PROPOFOL  N/A 01/25/2022   Procedure: ESOPHAGOGASTRODUODENOSCOPY (EGD) WITH PROPOFOL ;  Surgeon: Normie Becton., MD;  Location: [facility];  Service: Gastroenterology;  Laterality: N/A;   EUS N/A 01/25/2022   Procedure: UPPER ENDOSCOPIC ULTRASOUND (EUS) LINEAR;  Surgeon: Normie Becton., MD;  Location: [facility];  Service: Gastroenterology;  Laterality: N/A;   FOOT NEUROMA SURGERY  1985   WISDOM TOOTH EXTRACTION      FAMILY HISTORY: Family History  Problem Relation Age of Onset   Stroke Mother 54   Heart failure Mother    Heart attack Mother    Heart disease Mother    Heart attack Father 60   Heart disease Father    Heart disease Brother    Heart failure Brother        CHF   Heart disease Maternal Grandmother    Heart attack Maternal Grandfather    Diabetes Son        IDDM   Stroke Paternal Uncle    Thyroid  disease Neg Hx    Stomach cancer Neg Hx    Esophageal cancer Neg Hx    Colon polyps Neg Hx    Colon cancer Neg Hx    Rectal cancer Neg Hx     SOCIAL HISTORY: Social History   Socioeconomic History   Marital status: Married    Spouse name: Ethelle Herb B. Gossard   Number of children: 2   Years of education: MA   Highest education level: Master's degree (e.g., MA, MS, MEng, MEd, MSW, MBA)  Occupational History   Occupation: Retired    Comment: Editor, commissioning  Tobacco Use   Smoking status: Never   Smokeless tobacco: Never  Vaping Use   Vaping status: Never Used  Substance and Sexual Activity   Alcohol use: Not Currently   Drug use: No   Sexual activity: Not Currently  Other Topics Concern   Not on file  Social History Narrative   12/23/20 Patient lives at [facility] with spouse.   Caffeine Use: 1 cup daily   2 kids- 4 grandchildren- two boys and two girls      Social Drivers of Manufacturing engineer Strain: Low Risk  (  02/17/2023)   Received from [facility]   Overall Financial Resource Strain (CARDIA)    Difficulty of Paying Living Expenses: Not hard at all  Food Insecurity: No Food Insecurity (02/17/2023)   [facility]   Hunger Vital Sign    Worried About Running Out of Food in the Last Year: Never true    Ran Out of Food in the Last Year: Never true  Transportation Needs: No Transportation Needs (02/17/2023)   Received from [facility] - Transportation    Lack of Transportation (Medical): No    Lack of Transportation (Non-Medical): No  Physical Activity: Sufficiently Active (02/17/2023)   Received from [facility]   Exercise Vital Sign    Days of Exercise per Week: 6 days    Minutes of Exercise per Session: 120 min  Stress: Stress Concern Present (02/17/2023)   Received from [facility] - Occupational Stress Questionnaire    Feeling of Stress : To some extent  Social Connections: Moderately Integrated (02/17/2023)   Received from [facility]   Social Network    How would you rate your social network (family, work, friends)?: Adequate participation with social networks  Intimate Partner Violence: Not At Risk (02/17/2023)   Received from [facility]   HITS    Over the last 12 months how often did your partner physically hurt you?: Never    Over the last 12 months how often did your partner insult you or talk down to you?: Never    Over the last 12 months how often did your partner threaten you with physical harm?: Never    Over the last 12 months how often did your partner scream or curse at you?: Never     PHYSICAL EXAM  GENERAL EXAM/CONSTITUTIONAL: Vitals:  Vitals:   06/09/23 0837  BP: 107/62  Pulse: 67  Weight: 108 lb 3.2 oz (49.1 kg)  Height: 5\' 6"  (1.676 m)   Body mass index is 17.46 kg/m. Wt Readings from Last 3 Encounters:  06/09/23 108 lb 3.2 oz (49.1 kg)   06/07/23 108 lb (49 kg)  03/10/23 109 lb 4 oz (49.6 kg)   Patient is in no distress; well developed, nourished and groomed; neck is supple  CARDIOVASCULAR: Examination of carotid arteries is normal; no carotid bruits Regular rate and rhythm, no murmurs Examination of peripheral vascular system by observation and palpation is normal  EYES: Ophthalmoscopic exam of optic discs and posterior segments is normal; no papilledema or hemorrhages No results found.  MUSCULOSKELETAL: Gait, strength, tone, movements noted in Neurologic exam below  NEUROLOGIC: MENTAL STATUS:      No data to display         awake, alert, oriented to person, place and time recent and remote memory intact normal attention and concentration language fluent, comprehension intact, naming intact fund of knowledge appropriate  CRANIAL NERVE:  2nd - no papilledema on fundoscopic exam 2nd, 3rd, 4th, 6th - pupils equal and reactive to light, visual fields full to confrontation, extraocular muscles intact, no nystagmus 5th - facial sensation symmetric 7th - facial strength symmetric 8th - hearing intact 9th - palate elevates symmetrically, uvula midline 11th - shoulder shrug symmetric 12th - tongue protrusion midline  MOTOR:  normal bulk and tone, full strength in the BUE, BLE  SENSORY:  normal and symmetric to light touch, temperature, vibration, PP  COORDINATION:  finger-nose-finger, fine finger movements normal  REFLEXES:  deep tendon reflexes TRACE  and symmetric  GAIT/STATION:  narrow based gait     DIAGNOSTIC DATA (LABS, IMAGING, TESTING) - I reviewed patient records, labs, notes, testing and imaging myself where available.  Lab Results  Component Value Date   WBC 5.8 03/10/2023   HGB 14.0 03/10/2023   HCT 42.8 03/10/2023   MCV 92.8 03/10/2023   PLT 257 03/10/2023      Component Value Date/Time   NA 141 03/10/2023 1525   K 4.0 03/10/2023 1525   CL 104 03/10/2023 1525   CO2 24  03/10/2023 1525   GLUCOSE 83 03/10/2023 1525   BUN 22 03/10/2023 1525   CREATININE 1.03 (H) 03/10/2023 1525   CALCIUM  9.5 03/10/2023 1525   PROT 6.9 03/10/2023 1525   PROT 7.3 08/20/2013 1005   ALBUMIN 4.5 01/25/2023 0723   AST 25 03/10/2023 1525   ALT 30 (H) 03/10/2023 1525   ALKPHOS 61 01/25/2023 0723   BILITOT 0.7 03/10/2023 1525   Lab Results  Component Value Date   CHOL 206 (H) 01/25/2023   HDL 77.40 01/25/2023   LDLCALC 114 (H) 01/25/2023   TRIG 72.0 01/25/2023   CHOLHDL 3 01/25/2023   Lab Results  Component Value Date   HGBA1C 5.6 09/10/2015   Lab Results  Component Value Date   VITAMINB12 1,001 03/10/2023   Lab Results  Component Value Date   TSH 1.10 03/10/2023    11/25/15 MRI brain (with and without). No acute findings. Also noted is an incidental 2mm right cerebellar cystic focus (stable from MRI on 10/29/13) without associated gliosis, likely a benign cyst.  11/25/15 MRI cervical spine (with and without) demonstrating: 1. At C6-7: disc bulging and uncovertebral joint hypertrophy with moderate right and mild left foraminal stenosis. 2. At C5-6: disc bulging and uncovertebral joint hypertrophy with mild biforaminal stenosis.  01/05/21 MRI of the brain with and without contrast shows the following:  1.   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  No other chronic ischemic foci are noted.  2.   Brain volume is normal for age.  3.   No acute findings.  Specifically, no DWI abnormality in the hippocampus.  4.   Normal enhancement pattern.   12/30/20 EEG - normal   ASSESSMENT AND PLAN  78 y.o. year old female here with transient disorientation, confusion lasting a few minutes in 2020 and 2022.  Suspicious for transient global amnesia versus daydreaming/decreased attention.   Dx:  1. 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    Omega Bible, MD 06/09/2023,  9:34 AM Certified in Neurology, Neurophysiology and Neuroimaging  [facility] [address] [place], [place] [address] [phone]

## 2023-06-09 NOTE — Telephone Encounter (Signed)
 no auth required sent to GI [phone]

## 2023-06-12 NOTE — Telephone Encounter (Signed)
 Hi Jessica Miles-  Is there any way you could help me with determining if patient needs an additional referral for rectal training since she says she currently only has 4 PT sessions left?

## 2023-06-12 NOTE — Telephone Encounter (Signed)
 Called to verify where MRI referral was sent. Informed patient was sent to [facility] gave her the phone number [phone]

## 2023-06-14 NOTE — Telephone Encounter (Signed)
 Late Entry: At appt on 06/09/23 Dr. Salli Crawley ordered ATTR testing. The sample was collected by this RN, form completed and signed by provider. Pt was given the completed mailing package to drop off at a [employer] location in [place] near her home.

## 2023-06-15 ENCOUNTER — Ambulatory Visit: Attending: Obstetrics and Gynecology

## 2023-06-15 DIAGNOSIS — R293 Abnormal posture: Secondary | ICD-10-CM | POA: Insufficient documentation

## 2023-06-15 DIAGNOSIS — M62838 Other muscle spasm: Secondary | ICD-10-CM | POA: Insufficient documentation

## 2023-06-15 DIAGNOSIS — M6281 Muscle weakness (generalized): Secondary | ICD-10-CM | POA: Insufficient documentation

## 2023-06-15 DIAGNOSIS — R279 Unspecified lack of coordination: Secondary | ICD-10-CM | POA: Diagnosis present

## 2023-06-15 DIAGNOSIS — M5459 Other low back pain: Secondary | ICD-10-CM | POA: Insufficient documentation

## 2023-06-15 DIAGNOSIS — M25552 Pain in left hip: Secondary | ICD-10-CM | POA: Diagnosis present

## 2023-06-15 DIAGNOSIS — R102 Pelvic and perineal pain: Secondary | ICD-10-CM | POA: Insufficient documentation

## 2023-06-15 NOTE — Therapy (Signed)
 OUTPATIENT PHYSICAL THERAPY FEMALE PELVIC TREATMENT   Patient Name: Jessica Miles MRN: [id number] DOB:1945/10/18, 78 y.o., female Today's Date: 06/15/2023  END OF SESSION:  PT End of Session - 06/15/23 1105     Visit Number 23    Date for PT Re-Evaluation 08/01/23    Authorization Type Medicare - needs Kx at 15    Progress Note Due on Visit 30    PT Start Time 1100    PT Stop Time 1140    PT Time Calculation (min) 40 min    Activity Tolerance Patient tolerated treatment well    Behavior During Therapy [facility] for tasks assessed/performed              Past Medical History:  Diagnosis Date   Allergy    Anxiety    COVID-19 virus infection    09-23-2019- 9-16 had monoclonal infusion    Depression    History of IBS    HPV (human papilloma virus) infection    Hyperlipidemia    83- not taking statin    Loss of taste    Neuropathy 2015   surgery for Mortons neuroma in 1985   Osteopenia    Osteoporosis    Raynaud's disease 02/2020   Tachycardia    Thyroid  disease    Past Surgical History:  Procedure Laterality Date   ANAL RECTAL MANOMETRY N/A 05/03/2023   Procedure: [name];  Surgeon: Normie Becton., MD;  Location: [facility];  Service: Gastroenterology;  Laterality: N/A;   BIOPSY  01/25/2022   Procedure: BIOPSY;  Surgeon: Normie Becton., MD;  Location: [facility];  Service: Gastroenterology;;   COLONOSCOPY  2019   hems only    ESOPHAGOGASTRODUODENOSCOPY (EGD) WITH PROPOFOL  N/A 01/25/2022   Procedure: ESOPHAGOGASTRODUODENOSCOPY (EGD) WITH PROPOFOL ;  Surgeon: Normie Becton., MD;  Location: [facility];  Service: Gastroenterology;  Laterality: N/A;   EUS N/A 01/25/2022   Procedure: UPPER ENDOSCOPIC ULTRASOUND (EUS) LINEAR;  Surgeon: Normie Becton., MD;  Location: [facility];  Service: Gastroenterology;  Laterality: N/A;   FOOT NEUROMA SURGERY  1985   WISDOM TOOTH EXTRACTION     Patient Active Problem List   Diagnosis Date  Noted   Abnormal laboratory test result 06/09/2023   Cardiac arrhythmia 06/09/2023   Encounter for medication refill 06/09/2023   Finding of above normal blood pressure 06/09/2023   Iatrogenic thyrotoxicosis 06/09/2023   Menopausal symptom 06/09/2023   Muscle weakness of extremity 06/09/2023   Need for vaccination 06/09/2023   Neuroma of foot 06/09/2023   Nuclear senile cataract 06/09/2023   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   Disorder of skin 06/09/2023   Encounter for routine chest x-ray 06/09/2023   Labial irritation 06/07/2023   Vertigo 03/10/2023   Right upper quadrant abdominal mass 01/26/2023   Panic attacks 10/26/2022   Allergic reaction to insect bite 06/23/2022   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   Severely underweight adult 09/07/2021   Acute diarrhea 04/27/2021   Inflamed sebaceous cyst 02/11/2021   Transient disorientation 10/20/2020   Memory change 08/05/2020   Family history of early CAD 02/27/2020   Tremor 02/27/2020   Aortic atherosclerosis (HCC) 12/19/2019   Urinary urgency 05/21/2019   Bladder irritation 05/21/2019   External hemorrhoid 05/21/2019   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   Raynaud's syndrome 02/09/2019   Osteoarthritis of thumb, 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   Fatigue 12/19/2017   Acute left-sided low back pain without sciatica 12/19/2017   SVT (supraventricular tachycardia) (HCC) 08/31/2017   Peripheral neuropathy 08/31/2017  Elbow pain 10/21/2015   Dry eyes 09/21/2015   Atopic dermatitis 03/13/2015   IBS (irritable bowel syndrome) 03/18/2014   Osteoporosis 03/18/2014   Major depressive disorder, recurrent episode, moderate (HCC) 03/18/2014   Hypothyroidism 12/14/2013    PCP: Judithann Novas, MD  REFERRING PROVIDER: Adan Holms, MD  REFERRING DIAG: M79.18 (ICD-10-CM) - Myalgia, other site G58.8 (ICD-10-CM) - Other specified mononeuropathies  THERAPY DIAG:  Other muscle spasm  Muscle weakness (generalized)  Pain in left  hip  Other low back pain  Pelvic pain  Unspecified lack of coordination  Abnormal posture  Rationale for Evaluation and Treatment: Rehabilitation  ONSET DATE: 3 years   SUBJECTIVE:                                                                                                                                                                                           SUBJECTIVE STATEMENT: Pt states that she used new bike seat for 3 weeks and realized she was having exacerbation of pudendal neuralgia due to large increase in vulvar pain. She has stopped using the seat. She is using a new cushion for sitting. She is having enough pain that she will not wear pants some of the time  PAIN: 06/15/23 Are you having pain? Yes NPRS scale: 5/10 Pain location: Lt hip and perineum/vulva; lower abdomen discomfort  Pain type: soreness Pain description: intermittent   Aggravating factors: sitting Relieving factors: lying flat, sitting on toilet  PRECAUTIONS: Other: large mass in Rt lower quadrant that has not been treated by medical team  RED FLAGS: None   WEIGHT BEARING RESTRICTIONS: No  FALLS:  Has patient fallen in last 6 months? No  LIVING ENVIRONMENT: Lives with: lives with their family Lives in: House/apartment  OCCUPATION: retired Runner, broadcasting/film/video  PLOF: Independent  PATIENT GOALS: decrease pain  PERTINENT HISTORY:  Interstitial cystitis   Updated 04/13/23:  BOWEL MOVEMENT: Pain with bowel movement: No Type of bowel movement:Frequency up to 5x/hour anywhere from solid to diarrhea; she will go 1-2 hours after eating on average and Strain No Fully empty rectum: No Leakage: Yes: seed size stool can come out without sensation when walking   Pads: No Fiber supplement: No  URINATION: Pain with urination: No Fully empty bladder: Yes: - Stream: Weak Urgency: No - but not drinking much and goes every time she walks by the bathroom  Frequency: as often as she can - very small  amounts  Leakage: none Pads: No  INTERCOURSE: NA  PREGNANCY: Vaginal deliveries 2 Tearing No C-section deliveries 0 Currently pregnant No  PROLAPSE: None   OBJECTIVE:  Note: Objective measures were completed at  Evaluation unless otherwise noted. 05/09/23: Grade 1 uterine ligament laxity Significant tenderness with palpation of cervix Significant improvement with paradoxical contraction - not present anymore with lift when squeezing and appropriate lengthening with bearing down    04/25/23: Grade 1 uterine ligament laxity Significant tenderness with palpation of cervix Paradoxical pelvic floor muscle contraction with attempting to bear down to look for anterior/posterior vaginal wall laxity (could be fear of releasing gas/urine)  04/13/23:               External Perineal Exam dryness, tenderness, labial fusion bil                             Internal Pelvic Floor Pt has increased bil pelvic floor muscle tenderness and tightness; bladder restriction and reproduction of urgency with palpation; reproduction of lower abdominal pain with palpation of tight anterior Rt pelvic floor muscles  Patient confirms identification and approves PT to assess internal pelvic floor and treatment Yes   PELVIC MMT:    MMT 04/13/23  Vaginal 2/5, 5 second hold, 5 repeat contractions  (Blank rows = not tested)        TONE: High Bil  01/26/23:               External Perineal Exam dryness, tenderness, labial fusion bil                             Internal Pelvic Floor significant superficial tenderness with report of burning, pinching and stinging; muscle spasm and aching in Lt deep pelvic floor muscle   Patient confirms identification and approves PT to assess internal pelvic floor and treatment Yes   PELVIC MMT:    MMT eval  Vaginal 1/5, 4 second hold, 6 repeat contractions  (Blank rows = not tested)        TONE: High Lt side  PROLAPSE: Not tested   01/24/23: DIAGNOSTIC FINDINGS:   CT Lt hip: Mild OA, 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, swelling of quadratus femoris muscle, tendinopathic and frayed hamstring origin Scheduled for anal manometry in April 2025   COGNITION: Overall cognitive status: Within functional limits for tasks assessed     SENSATION: Light touch: Appears intact Proprioception: Appears intact   FUNCTIONAL/SPECIAL TESTS:  Single leg stance:  Rt: more stable, some Lt drop - compensated trendelenburg  Lt: less stable, more Rt drop - compensated trendelenburg Squat: Lt weight shift, Rt valgus knee collapse FAIR: (+) Lt hip Hamstring length: significant decrease with pain on Lt, -75 degrees Curl-up test: breath holding, but no distortion, 2 finger width diastasis in upper abdominals Bed mobility: breath holding  Palpable transversus abdominus contraction bil, but weak Tenderness at Lt ischial tuberosity and hamstrings/Lt posterolateral hip mm  GAIT: Comments: WNL  POSTURE: forward head, decreased lumbar lordosis, posterior pelvic tilt, and Lt lower thoracic/upper lumbar curvature, elevated Lt iliac crest  PELVIC ALIGNMENT: Lt posterior rotation, Lt sacral facing rotation   LUMBARAROM/PROM:  A/PROM A/PROM  Eval (% available)  Flexion 75, pain on LT  Extension 75  Right lateral flexion 100  Left lateral flexion 100, pain in back and Lt pelvis  Right rotation 75  Left rotation 50, feels good on back   (Blank rows = not tested)   LOWER EXTREMITY MMT:  MMT Right eval Left eval  Hip abduction 5/5 5/5  Hip adduction    Hip internal rotation 3/5  4-/5  Hip external rotation 3/5 4/5   PALPATION:   General: Large mass located in Rt abdomen lateral to umbilicus, non-tender, but with pressure it caused discomfort in low back - patient had not noticed before and reports no doctor has mentioned to her before    TODAY'S TREATMENT:                                                                                                                               DATE:  06/15/23 Manual: Pt provides verbal consent for internal vaginal/rectal pelvic floor exam. Internal vaginal pelvic floor muscle release to superficial and deep pelvic floor muscles Proximal adductor and hamstring release  Internal rectal Lt pelvic floor muscle release in side lying Therapeutic activities: Seated postural correction to correct leg cross and posterior pelvic tilt   05/30/23 Neuromuscular re-education: RUSI for pelvic floor muscle biofeedback used in supine Pelvic floor muscle contractions 3 x 10 with multimodal cues for improved proprioception and performance Transversus abdominus muscle contractions working on decreasing oblique activation with multimodal cues for improved proprioception and performance Decreasing abdominal bearing down on bladder Therapeutic activities: Prolapse symptoms vs IC symptoms  HEP focus this next week - inverted lying positions, working on gentle pelvic floor muscle A/ROM in this position to help reduce any symptoms from vaginal wall laxity Working on decreasing lower abdominal bearing down with all activities - including laughing (observed this session)   05/23/23 Manual: ILU massage Bowel mobilization Bladder mobilization externally Abdominal soft tissue mobilization  Therapeutic activities: Pt education on IC flare ups and how to manage - learning her symptoms of flare ups and when she may need a little more intervention She was encouraged to follow MD instruction for use of estrogen cream - believe it will really help with her pain and symptoms  Education on adding more cushion to bike seat - numbness sounds like mechanical compression Pt education performed on the amount of stool that is present in bowel and how there is stool sitting in anus due to decreased sensation and RAIR reflex  PATIENT EDUCATION:  Education details: See above Person educated: Patient Education method:  Explanation, Demonstration, Tactile cues, Verbal cues, and Handouts Education comprehension: verbalized understanding  HOME EXERCISE PROGRAM: [email or web address]  ASSESSMENT:  CLINICAL IMPRESSION: Pt has had exacerbation of pudendal neuralgia after testing out a new bike seat and pushing through some discomfort for several weeks. Due to this, we returned to manual techniques vaginally to pelvic floor muscles to help reduce restriction surrounding Lt pudendal nerve. She did have pain, but tolerated well with good release. Believe we were able to access for restriction surrounding pelvic floor muscles rectally vs vaginally. She also demonstrated significant restriction/trigger point in proximal adductors; we discussed that dry needling may be beneficial next session (discussed that there is a charge associated with this now and she was agreeable).  She will continue to benefit from skilled PT intervention in order to decrease pelvic  pain, improve low back/Lt hip pain,decrease urinary urgency, decrease lower abdominal pain, and begin/progress functional strengthening program.   OBJECTIVE IMPAIRMENTS: decreased activity tolerance, decreased coordination, decreased endurance, decreased mobility, decreased strength, increased fascial restrictions, increased muscle spasms, impaired tone, postural dysfunction, and pain.   ACTIVITY LIMITATIONS: lifting, bending, sitting, squatting, and bed mobility  PARTICIPATION LIMITATIONS: cleaning, driving, and community activity  PERSONAL FACTORS: 1 comorbidity: medical history are also affecting patient's functional outcome.   REHAB POTENTIAL: Good  CLINICAL DECISION MAKING: Evolving/moderate complexity  EVALUATION COMPLEXITY: Moderate   GOALS: Goals reviewed with patient? Yes  SHORT TERM GOALS: Updated 06/15/23  Pt will be independent with HEP.   Baseline: Goal status: MET 04/13/23  2.  Pt will be able to teach back and utilize urge suppression technique in order  to help reduce number of trips to the bathroom.    Baseline:  Goal status: MET 05/09/23  3.  Pt will begin use of pelvic floor muscle wand on regular basis (2-3x/week) in order to help reduce pelvic floor muscle tension and pain. Baseline:  Goal status: IN PROGRESS 06/15/23  4.  Pt will be independent with diaphragmatic breathing and down training activities in order to improve pelvic floor relaxation.  Baseline:  Goal status: MET 05/09/23  5.  Pt will report pelvic pain no greater than 4/10. Baseline:  Goal status: MET 05/09/23   LONG TERM GOALS: Updated 05/09/23  Pt will be independent with advanced HEP.   Baseline:  Goal status: IN PROGRESS 06/15/23  2.  Pt will be able to go 2-3 hours in between voids without urgency or incontinence in order to improve QOL and perform all functional activities with less difficulty.   Baseline:  Goal status: IN PROGRESS 06/15/23  3.  Pt will report pelvic pain no greater than 2/10.  Baseline:  Goal status: IN PROGRESS 06/15/23  4.  Pt will be able to sit for >30 minutes without any increased pelvic pain.  Baseline:  Goal status: IN PROGRESS 06/15/23  5.  Pt will be able to stand on single leg without pelvic drop and squat without weight shift in order to demonstrate improved functional core and hip strength to more appropriately stabilize pelvis.  Baseline:  Goal status: IN PROGRESS 06/15/23  6.  Pt will demonstrate increase in all impaired hip strength by 1 muscle grades in order to demonstrate improved lumbopelvic support and increase functional ability.   Baseline:  Goal status: IN PROGRESS 06/15/23  PLAN:  PT FREQUENCY: 1-2x/week  PT DURATION: 12 weeks  PLANNED INTERVENTIONS: 97110-Therapeutic exercises, 97530- Therapeutic activity, 97112- Neuromuscular re-education, 97535- Self Care, [id number]- Manual therapy, Dry Needling, and Biofeedback  PLAN FOR NEXT SESSION: Plan to work on breathing techniques and manual techniques to abdomen; dry  needling to Lt adductors; continue pelvic floor muscle release; adductor stretches; pudendal nerve glides    Verlena Glenn, PT, DPT06/05/2509:06 AM

## 2023-06-18 LAB — MULTIPLE MYELOMA PANEL, SERUM
Albumin SerPl Elph-Mcnc: 4 g/dL (ref 2.9–4.4)
Albumin/Glob SerPl: 1.5 (ref 0.7–1.7)
Alpha 1: 0.3 g/dL (ref 0.0–0.4)
Alpha2 Glob SerPl Elph-Mcnc: 0.6 g/dL (ref 0.4–1.0)
B-Globulin SerPl Elph-Mcnc: 0.9 g/dL (ref 0.7–1.3)
Gamma Glob SerPl Elph-Mcnc: 1 g/dL (ref 0.4–1.8)
Globulin, Total: 2.8 g/dL (ref 2.2–3.9)
IgA/Immunoglobulin A, Serum: 140 mg/dL (ref 64–422)
IgG (Immunoglobin G), Serum: 967 mg/dL (ref 586–1602)
IgM (Immunoglobulin M), Srm: 42 mg/dL (ref 26–217)
Total Protein: 6.8 g/dL (ref 6.0–8.5)

## 2023-06-18 LAB — CELIAC AB TTG DGP TIGA
Antigliadin Abs, IgA: 3 U (ref 0–19)
Gliadin IgG: 4 U (ref 0–19)
Tissue Transglut Ab: <2 U/mL (ref 0–5)
Transglutaminase IgA: <2 U/mL (ref 0–3)

## 2023-06-18 LAB — ANCA PROFILE
Anti-MPO Antibodies: <0.2 U (ref 0.0–0.9)
Anti-PR3 Antibodies: <0.2 U (ref 0.0–0.9)
Atypical pANCA: <1:20 {titer}
C-ANCA: <1:20 {titer}
P-ANCA: <1:20 {titer}

## 2023-06-18 LAB — SJOGREN'S SYNDROME ANTIBODS(SSA + SSB)
ENA SSA (RO) Ab: <0.2 AI (ref 0.0–0.9)
ENA SSB (LA) Ab: <0.2 AI (ref 0.0–0.9)

## 2023-06-18 LAB — ANA W/REFLEX: ANA Titer 1: NEGATIVE

## 2023-06-18 LAB — VITAMIN B6: Vitamin B6: 102.6 ug/L — ABNORMAL HIGH (ref 3.4–65.2)

## 2023-06-18 LAB — VITAMIN B1: Thiamine: 161.3 nmol/L (ref 66.5–200.0)

## 2023-06-19 ENCOUNTER — Telehealth: Payer: Self-pay

## 2023-06-19 NOTE — Telephone Encounter (Signed)
 Results  ATTR Results

## 2023-06-20 ENCOUNTER — Ambulatory Visit: Payer: Self-pay | Admitting: Diagnostic Neuroimaging

## 2023-06-22 ENCOUNTER — Ambulatory Visit

## 2023-06-22 DIAGNOSIS — R102 Pelvic and perineal pain: Secondary | ICD-10-CM

## 2023-06-22 DIAGNOSIS — M62838 Other muscle spasm: Secondary | ICD-10-CM | POA: Diagnosis not present

## 2023-06-22 DIAGNOSIS — M6281 Muscle weakness (generalized): Secondary | ICD-10-CM

## 2023-06-22 DIAGNOSIS — R279 Unspecified lack of coordination: Secondary | ICD-10-CM

## 2023-06-22 DIAGNOSIS — M5459 Other low back pain: Secondary | ICD-10-CM

## 2023-06-22 DIAGNOSIS — R293 Abnormal posture: Secondary | ICD-10-CM

## 2023-06-22 DIAGNOSIS — M25552 Pain in left hip: Secondary | ICD-10-CM

## 2023-06-22 NOTE — Therapy (Signed)
 OUTPATIENT PHYSICAL THERAPY FEMALE PELVIC TREATMENT   Patient Name: Jessica Miles MRN: [id number] DOB:1945/07/23, 78 y.o., female Today's Date: 06/22/2023  END OF SESSION:  PT End of Session - 06/22/23 1104     Visit Number 24    Date for PT Re-Evaluation 08/01/23    Authorization Type Medicare - needs Kx at 15    Progress Note Due on Visit 30    PT Start Time 1100    PT Stop Time 1140    PT Time Calculation (min) 40 min    Activity Tolerance Patient tolerated treatment well    Behavior During Therapy [facility] for tasks assessed/performed           Past Medical History:  Diagnosis Date   Allergy    Anxiety    COVID-19 virus infection    09-23-2019- 9-16 had monoclonal infusion    Depression    History of IBS    HPV (human papilloma virus) infection    Hyperlipidemia    83- not taking statin    Loss of taste    Neuropathy 2015   surgery for Mortons neuroma in 1985   Osteopenia    Osteoporosis    Raynaud's disease 02/2020   Tachycardia    Thyroid  disease    Past Surgical History:  Procedure Laterality Date   ANAL RECTAL MANOMETRY N/A 05/03/2023   Procedure: [name];  Surgeon: Normie Becton., MD;  Location: [facility];  Service: Gastroenterology;  Laterality: N/A;   BIOPSY  01/25/2022   Procedure: BIOPSY;  Surgeon: Normie Becton., MD;  Location: [facility];  Service: Gastroenterology;;   COLONOSCOPY  2019   hems only    ESOPHAGOGASTRODUODENOSCOPY (EGD) WITH PROPOFOL  N/A 01/25/2022   Procedure: ESOPHAGOGASTRODUODENOSCOPY (EGD) WITH PROPOFOL ;  Surgeon: Normie Becton., MD;  Location: [facility];  Service: Gastroenterology;  Laterality: N/A;   EUS N/A 01/25/2022   Procedure: UPPER ENDOSCOPIC ULTRASOUND (EUS) LINEAR;  Surgeon: Normie Becton., MD;  Location: [facility];  Service: Gastroenterology;  Laterality: N/A;   FOOT NEUROMA SURGERY  1985   WISDOM TOOTH EXTRACTION     Patient Active Problem List   Diagnosis Date  Noted   Abnormal laboratory test result 06/09/2023   Cardiac arrhythmia 06/09/2023   Encounter for medication refill 06/09/2023   Finding of above normal blood pressure 06/09/2023   Iatrogenic thyrotoxicosis 06/09/2023   Menopausal symptom 06/09/2023   Muscle weakness of extremity 06/09/2023   Need for vaccination 06/09/2023   Neuroma of foot 06/09/2023   Nuclear senile cataract 06/09/2023   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   Disorder of skin 06/09/2023   Encounter for routine chest x-ray 06/09/2023   Labial irritation 06/07/2023   Vertigo 03/10/2023   Right upper quadrant abdominal mass 01/26/2023   Panic attacks 10/26/2022   Allergic reaction to insect bite 06/23/2022   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   Severely underweight adult 09/07/2021   Acute diarrhea 04/27/2021   Inflamed sebaceous cyst 02/11/2021   Transient disorientation 10/20/2020   Memory change 08/05/2020   Family history of early CAD 02/27/2020   Tremor 02/27/2020   Aortic atherosclerosis (HCC) 12/19/2019   Urinary urgency 05/21/2019   Bladder irritation 05/21/2019   External hemorrhoid 05/21/2019   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   Raynaud's syndrome 02/09/2019   Osteoarthritis of thumb, 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   Fatigue 12/19/2017   Acute left-sided low back pain without sciatica 12/19/2017   SVT (supraventricular tachycardia) (HCC) 08/31/2017   Peripheral neuropathy 08/31/2017   Elbow pain 10/21/2015  Dry eyes 09/21/2015   Atopic dermatitis 03/13/2015   IBS (irritable bowel syndrome) 03/18/2014   Osteoporosis 03/18/2014   Major depressive disorder, recurrent episode, moderate (HCC) 03/18/2014   Hypothyroidism 12/14/2013    PCP: Judithann Novas, MD  REFERRING PROVIDER: Adan Holms, MD  REFERRING DIAG: M79.18 (ICD-10-CM) - Myalgia, other site G58.8 (ICD-10-CM) - Other specified mononeuropathies  THERAPY DIAG:  Other muscle spasm  Muscle weakness (generalized)  Pain in left  hip  Other low back pain  Pelvic pain  Unspecified lack of coordination  Abnormal posture  Rationale for Evaluation and Treatment: Rehabilitation  ONSET DATE: 3 years   SUBJECTIVE:                                                                                                                                                                                           SUBJECTIVE STATEMENT: Pt states that she going for MRI tomorrow and Monday to get thoracic/brain/lumbar imaging. She has had blood work done and found to have twice the upper range of B6, which neurologist has said could potentially cause neuropathy. She saw new GI doc yesterday and was instructed to take Miralax daily to try and maintain where she is.   After session last week she had a good day the day after and then a lot of pain the following day.   PAIN: 06/15/23 Are you having pain? Yes NPRS scale: 5/10 Pain location: Lt hip and perineum/vulva; lower abdomen discomfort  Pain type: soreness Pain description: intermittent   Aggravating factors: sitting Relieving factors: lying flat, sitting on toilet  PRECAUTIONS: Other: large mass in Rt lower quadrant that has not been treated by medical team  RED FLAGS: None   WEIGHT BEARING RESTRICTIONS: No  FALLS:  Has patient fallen in last 6 months? No  LIVING ENVIRONMENT: Lives with: lives with their family Lives in: House/apartment  OCCUPATION: retired Runner, broadcasting/film/video  PLOF: Independent  PATIENT GOALS: decrease pain  PERTINENT HISTORY:  Interstitial cystitis   Updated 04/13/23:  BOWEL MOVEMENT: Pain with bowel movement: No Type of bowel movement:Frequency up to 5x/hour anywhere from solid to diarrhea; she will go 1-2 hours after eating on average and Strain No Fully empty rectum: No Leakage: Yes: seed size stool can come out without sensation when walking   Pads: No Fiber supplement: No  URINATION: Pain with urination: No Fully empty bladder: Yes:  - Stream: Weak Urgency: No - but not drinking much and goes every time she walks by the bathroom  Frequency: as often as she can - very small amounts  Leakage: none Pads: No  INTERCOURSE: NA  PREGNANCY: Vaginal deliveries 2  Tearing No C-section deliveries 0 Currently pregnant No  PROLAPSE: None   OBJECTIVE:  Note: Objective measures were completed at Evaluation unless otherwise noted. 05/09/23: Grade 1 uterine ligament laxity Significant tenderness with palpation of cervix Significant improvement with paradoxical contraction - not present anymore with lift when squeezing and appropriate lengthening with bearing down    04/25/23: Grade 1 uterine ligament laxity Significant tenderness with palpation of cervix Paradoxical pelvic floor muscle contraction with attempting to bear down to look for anterior/posterior vaginal wall laxity (could be fear of releasing gas/urine)  04/13/23:               External Perineal Exam dryness, tenderness, labial fusion bil                             Internal Pelvic Floor Pt has increased bil pelvic floor muscle tenderness and tightness; bladder restriction and reproduction of urgency with palpation; reproduction of lower abdominal pain with palpation of tight anterior Rt pelvic floor muscles  Patient confirms identification and approves PT to assess internal pelvic floor and treatment Yes   PELVIC MMT:    MMT 04/13/23  Vaginal 2/5, 5 second hold, 5 repeat contractions  (Blank rows = not tested)        TONE: High Bil  01/26/23:               External Perineal Exam dryness, tenderness, labial fusion bil                             Internal Pelvic Floor significant superficial tenderness with report of burning, pinching and stinging; muscle spasm and aching in Lt deep pelvic floor muscle   Patient confirms identification and approves PT to assess internal pelvic floor and treatment Yes   PELVIC MMT:    MMT eval  Vaginal 1/5, 4 second  hold, 6 repeat contractions  (Blank rows = not tested)        TONE: High Lt side  PROLAPSE: Not tested   01/24/23: 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, swelling of quadratus femoris muscle, tendinopathic and frayed hamstring origin Scheduled for anal manometry in April 2025   COGNITION: Overall cognitive status: Within functional limits for tasks assessed     SENSATION: Light touch: Appears intact Proprioception: Appears intact   FUNCTIONAL/SPECIAL TESTS:  Single leg stance:  Rt: more stable, some Lt drop - compensated trendelenburg  Lt: less stable, more Rt drop - compensated trendelenburg Squat: Lt weight shift, Rt valgus knee collapse FAIR: (+) Lt hip Hamstring length: significant decrease with pain on Lt, -75 degrees Curl-up test: breath holding, but no distortion, 2 finger width diastasis in upper abdominals Bed mobility: breath holding  Palpable transversus abdominus contraction bil, but weak Tenderness at Lt ischial tuberosity and hamstrings/Lt posterolateral hip mm  GAIT: Comments: WNL  POSTURE: forward head, decreased lumbar lordosis, posterior pelvic tilt, and Lt lower thoracic/upper lumbar curvature, elevated Lt iliac crest  PELVIC ALIGNMENT: Lt posterior rotation, Lt sacral facing rotation   LUMBARAROM/PROM:  A/PROM A/PROM  Eval (% available)  Flexion 75, pain on LT  Extension 75  Right lateral flexion 100  Left lateral flexion 100, pain in back and Lt pelvis  Right rotation 75  Left rotation 50, feels good on back   (Blank rows = not tested)   LOWER EXTREMITY MMT:  MMT Right eval Left eval  Hip abduction 5/5 5/5  Hip adduction    Hip internal rotation 3/5 4-/5  Hip external rotation 3/5 4/5   PALPATION:   General: Large mass located in Rt abdomen lateral to umbilicus, non-tender, but with pressure it caused discomfort in low back - patient had not noticed before and  reports no doctor has mentioned to her before    TODAY'S TREATMENT:                                                                                                                              DATE:  06/22/23 Manual: Trigger Point Dry Needling  Subsequent Treatment: Instructions provided previously at initial dry needling treatment.   Patient Verbal Consent Given: Yes Education Handout Provided: Previously Provided Muscles Treated: Lt hamstrings/adductors/obturator internus Electrical Stimulation Performed: No Treatment Response/Outcome: twitch response/release  Soft tissue mobilization to Lt thigh/obturator internus  Neuromuscular re-education: Pudendal nerve glides: Cat cow with narrow knees and wide feet 2 x 10 Cobra to Child's pose with narrow knees and wide feet 2 x 10 Therapeutic activities: Lunges 10x bil Lateral lunges 10x bil Squats 10x    06/15/23 Manual: Pt provides verbal consent for internal vaginal/rectal pelvic floor exam. Internal vaginal pelvic floor muscle release to superficial and deep pelvic floor muscles Proximal adductor and hamstring release  Internal rectal Lt pelvic floor muscle release in side lying Therapeutic activities: Seated postural correction to correct leg cross and posterior pelvic tilt   05/30/23 Neuromuscular re-education: RUSI for pelvic floor muscle biofeedback used in supine Pelvic floor muscle contractions 3 x 10 with multimodal cues for improved proprioception and performance Transversus abdominus muscle contractions working on decreasing oblique activation with multimodal cues for improved proprioception and performance Decreasing abdominal bearing down on bladder Therapeutic activities: Prolapse symptoms vs IC symptoms  HEP focus this next week - inverted lying positions, working on gentle pelvic floor muscle A/ROM in this position to help reduce any symptoms from vaginal wall laxity Working on decreasing lower abdominal  bearing down with all activities - including laughing (observed this session)   PATIENT EDUCATION:  Education details: See above Person educated: Patient Education method: Programmer, multimedia, Demonstration, Tactile cues, Verbal cues, and Handouts Education comprehension: verbalized understanding  HOME EXERCISE PROGRAM: [email or web address]  ASSESSMENT:  CLINICAL IMPRESSION: Pt having improvement with bladder pain currently and 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 We performed nerve glides today and some functional strengthening progressions of lunges/lateral lunges/squats. She tolerated all exercise very well. We decided to perform dry needling to trigger point in medial hamstrings, adductors, and obturator internus on Lt to help decrease pain and pudendal nerve irritation. She tolerated well, but did have significant twitch response in all areas. She will continue to benefit from skilled PT intervention in order to decrease pelvic pain, improve low back/Lt hip pain,decrease urinary urgency, decrease lower abdominal pain, and begin/progress functional strengthening program.   OBJECTIVE IMPAIRMENTS: decreased activity tolerance,  decreased coordination, decreased endurance, decreased mobility, decreased strength, increased fascial restrictions, increased muscle spasms, impaired tone, postural dysfunction, and pain.   ACTIVITY LIMITATIONS: lifting, bending, sitting, squatting, and bed mobility  PARTICIPATION LIMITATIONS: cleaning, driving, and community activity  PERSONAL FACTORS: 1 comorbidity: medical history are also affecting patient's functional outcome.   REHAB POTENTIAL: Good  CLINICAL DECISION MAKING: Evolving/moderate complexity  EVALUATION COMPLEXITY: Moderate   GOALS: Goals reviewed with patient? Yes  SHORT TERM GOALS: Updated 06/15/23  Pt will be independent with HEP.   Baseline: Goal status: MET 04/13/23  2.  Pt will be able to teach back and utilize urge suppression  technique in order to help reduce number of trips to the bathroom.    Baseline:  Goal status: MET 05/09/23  3.  Pt will begin use of pelvic floor muscle wand on regular basis (2-3x/week) in order to help reduce pelvic floor muscle tension and pain. Baseline:  Goal status: IN PROGRESS 06/15/23  4.  Pt will be independent with diaphragmatic breathing and down training activities in order to improve pelvic floor relaxation.  Baseline:  Goal status: MET 05/09/23  5.  Pt will report pelvic pain no greater than 4/10. Baseline:  Goal status: MET 05/09/23   LONG TERM GOALS: Updated 05/09/23  Pt will be independent with advanced HEP.   Baseline:  Goal status: IN PROGRESS 06/15/23  2.  Pt will be able to go 2-3 hours in between voids without urgency or incontinence in order to improve QOL and perform all functional activities with less difficulty.   Baseline:  Goal status: IN PROGRESS 06/15/23  3.  Pt will report pelvic pain no greater than 2/10.  Baseline:  Goal status: IN PROGRESS 06/15/23  4.  Pt will be able to sit for >30 minutes without any increased pelvic pain.  Baseline:  Goal status: IN PROGRESS 06/15/23  5.  Pt will be able to stand on single leg without pelvic drop and squat without weight shift in order to demonstrate improved functional core and hip strength to more appropriately stabilize pelvis.  Baseline:  Goal status: IN PROGRESS 06/15/23  6.  Pt will demonstrate increase in all impaired hip strength by 1 muscle grades in order to demonstrate improved lumbopelvic support and increase functional ability.   Baseline:  Goal status: IN PROGRESS 06/15/23  PLAN:  PT FREQUENCY: 1-2x/week  PT DURATION: 12 weeks  PLANNED INTERVENTIONS: 97110-Therapeutic exercises, 97530- Therapeutic activity, 97112- Neuromuscular re-education, 97535- Self Care, [id number]- Manual therapy, Dry Needling, and Biofeedback  PLAN FOR NEXT SESSION: Plan to work on breathing techniques and manual techniques  to abdomen; dry needling to Lt adductors; continue pelvic floor muscle release; adductor stretches; pudendal nerve glides    Verlena Glenn, PT, DPT06/12/2509:22 AM

## 2023-06-23 ENCOUNTER — Ambulatory Visit
Admission: RE | Admit: 2023-06-23 | Discharge: 2023-06-23 | Disposition: A | Discharge status: Home / Self Care | Source: Ambulatory Visit | Attending: Diagnostic Neuroimaging | Admitting: Diagnostic Neuroimaging

## 2023-06-23 DIAGNOSIS — R200 Anesthesia of skin: Secondary | ICD-10-CM

## 2023-06-23 DIAGNOSIS — R202 Paresthesia of skin: Secondary | ICD-10-CM | POA: Diagnosis not present

## 2023-06-23 MED ORDER — GADOPICLENOL 0.5 MMOL/ML IV SOLN
5.0000 mL | Freq: Once | INTRAVENOUS | Status: AC | PRN
  Administered 2023-06-23: 5 mL via INTRAVENOUS

## 2023-06-26 ENCOUNTER — Ambulatory Visit
Admission: RE | Admit: 2023-06-26 | Discharge: 2023-06-26 | Disposition: A | Discharge status: Home / Self Care | Source: Ambulatory Visit | Attending: Diagnostic Neuroimaging | Admitting: Diagnostic Neuroimaging

## 2023-06-26 DIAGNOSIS — R200 Anesthesia of skin: Secondary | ICD-10-CM | POA: Diagnosis not present

## 2023-06-26 DIAGNOSIS — R202 Paresthesia of skin: Secondary | ICD-10-CM | POA: Diagnosis not present

## 2023-06-26 MED ORDER — GADOPICLENOL 0.5 MMOL/ML IV SOLN: 7.5000 mL | Freq: Once | INTRAVENOUS | Status: AC | PRN

## 2023-06-27 ENCOUNTER — Ambulatory Visit

## 2023-06-27 DIAGNOSIS — R279 Unspecified lack of coordination: Secondary | ICD-10-CM

## 2023-06-27 DIAGNOSIS — M5459 Other low back pain: Secondary | ICD-10-CM

## 2023-06-27 DIAGNOSIS — M25552 Pain in left hip: Secondary | ICD-10-CM

## 2023-06-27 DIAGNOSIS — R102 Pelvic and perineal pain: Secondary | ICD-10-CM

## 2023-06-27 DIAGNOSIS — M6281 Muscle weakness (generalized): Secondary | ICD-10-CM

## 2023-06-27 DIAGNOSIS — R293 Abnormal posture: Secondary | ICD-10-CM

## 2023-06-27 DIAGNOSIS — M62838 Other muscle spasm: Secondary | ICD-10-CM

## 2023-06-27 NOTE — Therapy (Signed)
 OUTPATIENT PHYSICAL THERAPY FEMALE PELVIC TREATMENT   Patient Name: Jessica Miles MRN: [id number] DOB:1945/03/08, 78 y.o., female Today's Date: 06/27/2023  END OF SESSION:  PT End of Session - 06/27/23 1103     Visit Number 25    Date for PT Re-Evaluation 08/01/23    Authorization Type Medicare - needs Kx at 15    Progress Note Due on Visit 30    PT Start Time 1100    PT Stop Time 1145    PT Time Calculation (min) 45 min    Activity Tolerance Patient tolerated treatment well    Behavior During Therapy [facility] for tasks assessed/performed           Past Medical History:  Diagnosis Date   Allergy    Anxiety    COVID-19 virus infection    09-23-2019- 9-16 had monoclonal infusion    Depression    History of IBS    HPV (human papilloma virus) infection    Hyperlipidemia    82- not taking statin    Loss of taste    Neuropathy 2015   surgery for Mortons neuroma in 1985   Osteopenia    Osteoporosis    Raynaud's disease 02/2020   Tachycardia    Thyroid  disease    Past Surgical History:  Procedure Laterality Date   ANAL RECTAL MANOMETRY N/A 05/03/2023   Procedure: [name];  Surgeon: Normie Becton., MD;  Location: [facility];  Service: Gastroenterology;  Laterality: N/A;   BIOPSY  01/25/2022   Procedure: BIOPSY;  Surgeon: Normie Becton., MD;  Location: [facility];  Service: Gastroenterology;;   COLONOSCOPY  2019   hems only    ESOPHAGOGASTRODUODENOSCOPY (EGD) WITH PROPOFOL  N/A 01/25/2022   Procedure: ESOPHAGOGASTRODUODENOSCOPY (EGD) WITH PROPOFOL ;  Surgeon: Normie Becton., MD;  Location: [facility];  Service: Gastroenterology;  Laterality: N/A;   EUS N/A 01/25/2022   Procedure: UPPER ENDOSCOPIC ULTRASOUND (EUS) LINEAR;  Surgeon: Normie Becton., MD;  Location: [facility];  Service: Gastroenterology;  Laterality: N/A;   FOOT NEUROMA SURGERY  1985   WISDOM TOOTH EXTRACTION     Patient Active Problem List   Diagnosis Date  Noted   Abnormal laboratory test result 06/09/2023   Cardiac arrhythmia 06/09/2023   Encounter for medication refill 06/09/2023   Finding of above normal blood pressure 06/09/2023   Iatrogenic thyrotoxicosis 06/09/2023   Menopausal symptom 06/09/2023   Muscle weakness of extremity 06/09/2023   Need for vaccination 06/09/2023   Neuroma of foot 06/09/2023   Nuclear senile cataract 06/09/2023   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   Disorder of skin 06/09/2023   Encounter for routine chest x-ray 06/09/2023   Labial irritation 06/07/2023   Vertigo 03/10/2023   Right upper quadrant abdominal mass 01/26/2023   Panic attacks 10/26/2022   Allergic reaction to insect bite 06/23/2022   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   Severely underweight adult 09/07/2021   Acute diarrhea 04/27/2021   Inflamed sebaceous cyst 02/11/2021   Transient disorientation 10/20/2020   Memory change 08/05/2020   Family history of early CAD 02/27/2020   Tremor 02/27/2020   Aortic atherosclerosis (HCC) 12/19/2019   Urinary urgency 05/21/2019   Bladder irritation 05/21/2019   External hemorrhoid 05/21/2019   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   Raynaud's syndrome 02/09/2019   Osteoarthritis of thumb, 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   Fatigue 12/19/2017   Acute left-sided low back pain without sciatica 12/19/2017   SVT (supraventricular tachycardia) (HCC) 08/31/2017   Peripheral neuropathy 08/31/2017   Elbow pain 10/21/2015  Dry eyes 09/21/2015   Atopic dermatitis 03/13/2015   IBS (irritable bowel syndrome) 03/18/2014   Osteoporosis 03/18/2014   Major depressive disorder, recurrent episode, moderate (HCC) 03/18/2014   Hypothyroidism 12/14/2013    PCP: Judithann Novas, MD  REFERRING PROVIDER: Adan Holms, MD  REFERRING DIAG: M79.18 (ICD-10-CM) - Myalgia, other site G58.8 (ICD-10-CM) - Other specified mononeuropathies  THERAPY DIAG:  Other muscle spasm  Muscle weakness (generalized)  Pain in left  hip  Other low back pain  Pelvic pain  Unspecified lack of coordination  Abnormal posture  Rationale for Evaluation and Treatment: Rehabilitation  ONSET DATE: 3 years   SUBJECTIVE:                                                                                                                                                                                           SUBJECTIVE STATEMENT: Pt states that she has had a difficult week. She does feel like the dry needling was helpful last session.   PAIN: 06/27/23 Are you having pain? Yes NPRS scale: 4/10 Pain location: Lt hip and perineum/vulva; lower abdomen discomfort  Pain type: soreness Pain description: intermittent   Aggravating factors: sitting Relieving factors: lying flat, sitting on toilet  PRECAUTIONS: Other: large mass in Rt lower quadrant that has not been treated by medical team  RED FLAGS: None   WEIGHT BEARING RESTRICTIONS: No  FALLS:  Has patient fallen in last 6 months? No  LIVING ENVIRONMENT: Lives with: lives with their family Lives in: House/apartment  OCCUPATION: retired Runner, broadcasting/film/video  PLOF: Independent  PATIENT GOALS: decrease pain  PERTINENT HISTORY:  Interstitial cystitis   Updated 04/13/23:  BOWEL MOVEMENT: Pain with bowel movement: No Type of bowel movement:Frequency up to 5x/hour anywhere from solid to diarrhea; she will go 1-2 hours after eating on average and Strain No Fully empty rectum: No Leakage: Yes: seed size stool can come out without sensation when walking   Pads: No Fiber supplement: No  URINATION: Pain with urination: No Fully empty bladder: Yes: - Stream: Weak Urgency: No - but not drinking much and goes every time she walks by the bathroom  Frequency: as often as she can - very small amounts  Leakage: none Pads: No  INTERCOURSE: NA  PREGNANCY: Vaginal deliveries 2 Tearing No C-section deliveries 0 Currently pregnant No  PROLAPSE: None   OBJECTIVE:  Note:  Objective measures were completed at Evaluation unless otherwise noted. 05/09/23: Grade 1 uterine ligament laxity Significant tenderness with palpation of cervix Significant improvement with paradoxical contraction - not present anymore with lift when squeezing and appropriate lengthening with bearing down    04/25/23: Grade  1 uterine ligament laxity Significant tenderness with palpation of cervix Paradoxical pelvic floor muscle contraction with attempting to bear down to look for anterior/posterior vaginal wall laxity (could be fear of releasing gas/urine)  04/13/23:               External Perineal Exam dryness, tenderness, labial fusion bil                             Internal Pelvic Floor Pt has increased bil pelvic floor muscle tenderness and tightness; bladder restriction and reproduction of urgency with palpation; reproduction of lower abdominal pain with palpation of tight anterior Rt pelvic floor muscles  Patient confirms identification and approves PT to assess internal pelvic floor and treatment Yes   PELVIC MMT:    MMT 04/13/23  Vaginal 2/5, 5 second hold, 5 repeat contractions  (Blank rows = not tested)        TONE: High Bil  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, swelling of quadratus femoris muscle, tendinopathic and frayed hamstring origin Scheduled for anal  manometry in April 2025   COGNITION: Overall cognitive status: Within functional limits for tasks assessed     SENSATION: Light touch: Appears intact Proprioception: Appears intact   FUNCTIONAL/SPECIAL TESTS:  Single leg stance:  Rt: more stable, some Lt drop - compensated trendelenburg  Lt: less stable, more Rt drop - compensated trendelenburg Squat: Lt weight shift, Rt valgus knee collapse FAIR: (+) Lt hip Hamstring length: significant decrease with pain on Lt, -75 degrees Curl-up test: breath holding, but no distortion, 2 finger width diastasis in upper abdominals Bed mobility: breath holding  Palpable transversus abdominus contraction bil, but weak Tenderness at Lt ischial tuberosity and hamstrings/Lt posterolateral hip mm  GAIT: Comments: WNL  POSTURE: forward head, decreased lumbar lordosis, posterior pelvic tilt, and Lt lower thoracic/upper lumbar curvature, elevated Lt iliac crest  PELVIC ALIGNMENT: Lt posterior rotation, Lt sacral facing rotation   LUMBARAROM/PROM:  A/PROM A/PROM  Eval (% available)  Flexion 75, pain on LT  Extension 75  Right lateral flexion 100  Left lateral flexion 100, pain in back and Lt pelvis  Right rotation 75  Left rotation 50, feels good on back   (Blank rows = not tested)   LOWER EXTREMITY MMT:  MMT Right eval Left eval  Hip abduction 5/5 5/5  Hip adduction    Hip internal rotation 3/5 4-/5  Hip external rotation 3/5 4/5   PALPATION:   General: Large mass located in Rt abdomen lateral to umbilicus, non-tender, but with pressure it caused discomfort in low back - patient had not noticed before and reports  no doctor has mentioned to her before    TODAY'S TREATMENT:                                                                                                                              DATE:  06/27/23 Manual: Trigger Point Dry Needling  Subsequent Treatment: Instructions provided previously at initial dry needling  treatment.   Patient Verbal Consent Given: Yes Education Handout Provided: Previously Provided Muscles Treated: Lt hamstrings/adductors/obturator internus/glutes Electrical Stimulation Performed: No Treatment Response/Outcome: twitch response/release  Soft tissue mobilization to Lt thigh/obturator internus  Rt side lying long axis traction to Lt LE Supine Lt hip lateral mobilizations and traction with mobilization strap    06/22/23 Manual: Trigger Point Dry Needling  Subsequent Treatment: Instructions provided previously at initial dry needling treatment.   Patient Verbal Consent Given: Yes Education Handout Provided: Previously Provided Muscles Treated: Lt hamstrings/adductors/obturator internus Electrical Stimulation Performed: No Treatment Response/Outcome: twitch response/release  Soft tissue mobilization to Lt thigh/obturator internus  Neuromuscular re-education: Pudendal nerve glides: Cat cow with narrow knees and wide feet 2 x 10 Cobra to Child's pose with narrow knees and wide feet 2 x 10 Therapeutic activities: Lunges 10x bil Lateral lunges 10x bil Squats 10x    06/15/23 Manual: Pt provides verbal consent for internal vaginal/rectal pelvic floor exam. Internal vaginal pelvic floor muscle release to superficial and deep pelvic floor muscles Proximal adductor and hamstring release  Internal rectal Lt pelvic floor muscle release in side lying Therapeutic activities: Seated postural correction to correct leg cross and posterior pelvic tilt   PATIENT EDUCATION:  Education details: See above Person educated: Patient Education method: Programmer, multimedia, Demonstration, Tactile cues, Verbal cues, and Handouts Education comprehension: verbalized understanding  HOME EXERCISE PROGRAM: [email or web address]  ASSESSMENT:  CLINICAL IMPRESSION: Even though patient reports having a difficult week, she does feel like dry needling was helpful last session and wanted to perform again today.  She tolerated moderately well with more pain and significant twitch response. We followed dry needling with manual techniques to Lt obturator internal and adductors. Long axis traction and lateral traction/mobilizations performed to Lt hip with excellent response and relief of discomfort at end of session. We discussed benefit of returning to yoga and exercise classes to help her mentally as well as physically. However, she was encouraged to modify and return slowly, maybe performing shorter duration or stretching through part of classes. She will continue to benefit from skilled PT intervention in order to decrease pelvic pain, improve low back/Lt hip pain,decrease urinary urgency, decrease lower abdominal pain, and begin/progress functional strengthening program.   OBJECTIVE IMPAIRMENTS: decreased activity tolerance, decreased coordination, decreased endurance, decreased mobility, decreased strength, increased fascial restrictions, increased muscle spasms, impaired tone, postural dysfunction, and pain.   ACTIVITY LIMITATIONS: lifting, bending, sitting, squatting, and bed mobility  PARTICIPATION LIMITATIONS: cleaning, driving, and community activity  PERSONAL FACTORS: 1 comorbidity: medical history are also affecting patient's functional outcome.   REHAB POTENTIAL: Good  CLINICAL DECISION MAKING: Evolving/moderate complexity  EVALUATION COMPLEXITY: Moderate   GOALS: Goals reviewed with patient? Yes  SHORT TERM GOALS: Updated 06/15/23  Pt will be independent with HEP.   Baseline: Goal status: MET 04/13/23  2.  Pt will be able to teach back and utilize urge suppression technique in order to help reduce number of trips to the bathroom.    Baseline:  Goal status: MET 05/09/23  3.  Pt will begin use of pelvic floor muscle wand on regular basis (2-3x/week) in order to help reduce pelvic floor muscle tension and pain. Baseline:  Goal status: IN PROGRESS 06/15/23  4.  Pt will be independent  with diaphragmatic breathing and down training activities in order to improve pelvic floor relaxation.  Baseline:  Goal status: MET 05/09/23  5.  Pt will report pelvic pain no greater than 4/10. Baseline:  Goal status: MET 05/09/23   LONG TERM GOALS: Updated 05/09/23  Pt will be independent with advanced HEP.   Baseline:  Goal status: IN PROGRESS 06/15/23  2.  Pt will be able to go 2-3 hours in between voids without urgency or incontinence in order to improve QOL and perform all functional activities with less difficulty.   Baseline:  Goal status: IN PROGRESS 06/15/23  3.  Pt will report pelvic pain no greater than 2/10.  Baseline:  Goal status: IN PROGRESS 06/15/23  4.  Pt will be able to sit for >30 minutes without any increased pelvic pain.  Baseline:  Goal status: IN PROGRESS 06/15/23  5.  Pt will be able to stand on single leg without pelvic drop and squat without weight shift in order to demonstrate improved functional core and hip strength to more appropriately stabilize pelvis.  Baseline:  Goal status: IN PROGRESS 06/15/23  6.  Pt will demonstrate increase in all impaired hip strength by 1 muscle grades in order to demonstrate improved lumbopelvic support and increase functional ability.   Baseline:  Goal status: IN PROGRESS 06/15/23  PLAN:  PT FREQUENCY: 1-2x/week  PT DURATION: 12 weeks  PLANNED INTERVENTIONS: 97110-Therapeutic exercises, 97530- Therapeutic activity, 97112- Neuromuscular re-education, 97535- Self Care, [id number]- Manual therapy, Dry Needling, and Biofeedback  PLAN FOR NEXT SESSION: Plan to work on breathing techniques and manual techniques to abdomen; dry needling to Lt adductors; continue pelvic floor muscle release; adductor stretches; pudendal nerve glides    Verlena Glenn, PT, DPT06/17/2511:47 AM

## 2023-07-03 ENCOUNTER — Encounter: Payer: Self-pay | Admitting: Diagnostic Neuroimaging

## 2023-07-04 ENCOUNTER — Ambulatory Visit

## 2023-07-04 DIAGNOSIS — M25552 Pain in left hip: Secondary | ICD-10-CM

## 2023-07-04 DIAGNOSIS — R293 Abnormal posture: Secondary | ICD-10-CM

## 2023-07-04 DIAGNOSIS — M6281 Muscle weakness (generalized): Secondary | ICD-10-CM

## 2023-07-04 DIAGNOSIS — M5459 Other low back pain: Secondary | ICD-10-CM

## 2023-07-04 DIAGNOSIS — M62838 Other muscle spasm: Secondary | ICD-10-CM | POA: Diagnosis not present

## 2023-07-04 DIAGNOSIS — R279 Unspecified lack of coordination: Secondary | ICD-10-CM

## 2023-07-04 DIAGNOSIS — R102 Pelvic and perineal pain: Secondary | ICD-10-CM

## 2023-07-04 NOTE — Therapy (Signed)
 OUTPATIENT PHYSICAL THERAPY FEMALE PELVIC TREATMENT   Patient Name: Ledia Hanford MRN: [id number] DOB:March 04, 1945, 78 y.o., female Today's Date: 07/04/2023  END OF SESSION:  PT End of Session - 07/04/23 1056     Visit Number 26    Date for PT Re-Evaluation 08/01/23    Authorization Type Medicare - needs Kx at 15    Progress Note Due on Visit 30    PT Start Time 1100    PT Stop Time 1140    PT Time Calculation (min) 40 min    Activity Tolerance Patient tolerated treatment well    Behavior During Therapy [facility] for tasks assessed/performed           Past Medical History:  Diagnosis Date   Allergy    Anxiety    COVID-19 virus infection    09-23-2019- 9-16 had monoclonal infusion    Depression    History of IBS    HPV (human papilloma virus) infection    Hyperlipidemia    83- not taking statin    Loss of taste    Neuropathy 2015   surgery for Mortons neuroma in 1985   Osteopenia    Osteoporosis    Raynaud's disease 02/2020   Tachycardia    Thyroid  disease    Past Surgical History:  Procedure Laterality Date   ANAL RECTAL MANOMETRY N/A 05/03/2023   Procedure: CALA FERNS;  Surgeon: Wilhelmenia Aloha Raddle., MD;  Location: [facility];  Service: Gastroenterology;  Laterality: N/A;   BIOPSY  01/25/2022   Procedure: BIOPSY;  Surgeon: Wilhelmenia Aloha Raddle., MD;  Location: [facility];  Service: Gastroenterology;;   COLONOSCOPY  2019   hems only    ESOPHAGOGASTRODUODENOSCOPY (EGD) WITH PROPOFOL  N/A 01/25/2022   Procedure: ESOPHAGOGASTRODUODENOSCOPY (EGD) WITH PROPOFOL ;  Surgeon: Wilhelmenia Aloha Raddle., MD;  Location: [facility];  Service: Gastroenterology;  Laterality: N/A;   EUS N/A 01/25/2022   Procedure: UPPER ENDOSCOPIC ULTRASOUND (EUS) LINEAR;  Surgeon: Wilhelmenia Aloha Raddle., MD;  Location: [facility];  Service: Gastroenterology;  Laterality: N/A;   FOOT NEUROMA SURGERY  1985   WISDOM TOOTH EXTRACTION     Patient Active Problem List   Diagnosis Date  Noted   Abnormal laboratory test result 06/09/2023   Cardiac arrhythmia 06/09/2023   Encounter for medication refill 06/09/2023   Finding of above normal blood pressure 06/09/2023   Iatrogenic thyrotoxicosis 06/09/2023   Menopausal symptom 06/09/2023   Muscle weakness of extremity 06/09/2023   Need for vaccination 06/09/2023   Neuroma of foot 06/09/2023   Nuclear senile cataract 06/09/2023   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   Disorder of skin 06/09/2023   Encounter for routine chest x-ray 06/09/2023   Labial irritation 06/07/2023   Vertigo 03/10/2023   Right upper quadrant abdominal mass 01/26/2023   Panic attacks 10/26/2022   Allergic reaction to insect bite 06/23/2022   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   Severely underweight adult 09/07/2021   Acute diarrhea 04/27/2021   Inflamed sebaceous cyst 02/11/2021   Transient disorientation 10/20/2020   Memory change 08/05/2020   Family history of early CAD 02/27/2020   Tremor 02/27/2020   Aortic atherosclerosis (HCC) 12/19/2019   Urinary urgency 05/21/2019   Bladder irritation 05/21/2019   External hemorrhoid 05/21/2019   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   Raynaud's syndrome 02/09/2019   Osteoarthritis of thumb, 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   Fatigue 12/19/2017   Acute left-sided low back pain without sciatica 12/19/2017   SVT (supraventricular tachycardia) (HCC) 08/31/2017   Peripheral neuropathy 08/31/2017   Elbow pain 10/21/2015  Dry eyes 09/21/2015   Atopic dermatitis 03/13/2015   IBS (irritable bowel syndrome) 03/18/2014   Osteoporosis 03/18/2014   Major depressive disorder, recurrent episode, moderate (HCC) 03/18/2014   Hypothyroidism 12/14/2013    PCP: Avelina Greig BRAVO, MD  REFERRING PROVIDER: Darol Norris, MD  REFERRING DIAG: M79.18 (ICD-10-CM) - Myalgia, other site G58.8 (ICD-10-CM) - Other specified mononeuropathies  THERAPY DIAG:  Other muscle spasm  Muscle weakness (generalized)  Pain in left  hip  Other low back pain  Pelvic pain  Unspecified lack of coordination  Abnormal posture  Rationale for Evaluation and Treatment: Rehabilitation  ONSET DATE: 3 years   SUBJECTIVE:                                                                                                                                                                                           SUBJECTIVE STATEMENT: Pt states that she has had a much better week and even thought that she might be able to ride her bike. She still feels like she is not completely emptying rectum with bowel movements and has sensation of hard pebbles of stool.  PAIN: 07/04/23 Are you having pain? Yes NPRS scale: 1.5/10 Pain location: Lt hip and perineum/vulva; lower abdomen discomfort  Pain type: soreness Pain description: intermittent   Aggravating factors: sitting Relieving factors: lying flat, sitting on toilet  PRECAUTIONS: Other: large mass in Rt lower quadrant that has not been treated by medical team  RED FLAGS: None   WEIGHT BEARING RESTRICTIONS: No  FALLS:  Has patient fallen in last 6 months? No  LIVING ENVIRONMENT: Lives with: lives with their family Lives in: House/apartment  OCCUPATION: retired Runner, broadcasting/film/video  PLOF: Independent  PATIENT GOALS: decrease pain  PERTINENT HISTORY:  Interstitial cystitis   Updated 04/13/23:  BOWEL MOVEMENT: Pain with bowel movement: No Type of bowel movement:Frequency up to 5x/hour anywhere from solid to diarrhea; she will go 1-2 hours after eating on average and Strain No Fully empty rectum: No Leakage: Yes: seed size stool can come out without sensation when walking   Pads: No Fiber supplement: No  URINATION: Pain with urination: No Fully empty bladder: Yes: - Stream: Weak Urgency: No - but not drinking much and goes every time she walks by the bathroom  Frequency: as often as she can - very small amounts  Leakage: none Pads:  No  INTERCOURSE: NA  PREGNANCY: Vaginal deliveries 2 Tearing No C-section deliveries 0 Currently pregnant No  PROLAPSE: None   OBJECTIVE:  Note: Objective measures were completed at Evaluation unless otherwise noted. 05/09/23: Grade 1 uterine ligament laxity Significant tenderness with palpation of cervix Significant improvement  with paradoxical contraction - not present anymore with lift when squeezing and appropriate lengthening with bearing down    04/25/23: Grade 1 uterine ligament laxity Significant tenderness with palpation of cervix Paradoxical pelvic floor muscle contraction with attempting to bear down to look for anterior/posterior vaginal wall laxity (could be fear of releasing gas/urine)  04/13/23:               External Perineal Exam dryness, tenderness, labial fusion bil                             Internal Pelvic Floor Pt has increased bil pelvic floor muscle tenderness and tightness; bladder restriction and reproduction of urgency with palpation; reproduction of lower abdominal pain with palpation of tight anterior Rt pelvic floor muscles  Patient confirms identification and approves PT to assess internal pelvic floor and treatment Yes   PELVIC MMT:    MMT 04/13/23  Vaginal 2/5, 5 second hold, 5 repeat contractions  (Blank rows = not tested)        TONE: High Bil  01/26/23:               External Perineal Exam dryness, tenderness, labial fusion bil                             Internal Pelvic Floor significant superficial tenderness with report of burning, pinching and stinging; muscle spasm and aching in Lt deep pelvic floor muscle   Patient confirms identification and approves PT to assess internal pelvic floor and treatment Yes   PELVIC MMT:    MMT eval  Vaginal 1/5, 4 second hold, 6 repeat contractions  (Blank rows = not tested)        TONE: High Lt side  PROLAPSE: Not tested   01/24/23: DIAGNOSTIC FINDINGS:  CT Lt hip: Mild OA,  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, swelling of quadratus femoris muscle, tendinopathic and frayed hamstring origin Scheduled for anal manometry in April 2025   COGNITION: Overall cognitive status: Within functional limits for tasks assessed     SENSATION: Light touch: Appears intact Proprioception: Appears intact   FUNCTIONAL/SPECIAL TESTS:  Single leg stance:  Rt: more stable, some Lt drop - compensated trendelenburg  Lt: less stable, more Rt drop - compensated trendelenburg Squat: Lt weight shift, Rt valgus knee collapse FAIR: (+) Lt hip Hamstring length: significant decrease with pain on Lt, -75 degrees Curl-up test: breath holding, but no distortion, 2 finger width diastasis in upper abdominals Bed mobility: breath holding  Palpable transversus abdominus contraction bil, but weak Tenderness at Lt ischial tuberosity and hamstrings/Lt posterolateral hip mm  GAIT: Comments: WNL  POSTURE: forward head, decreased lumbar lordosis, posterior pelvic tilt, and Lt lower thoracic/upper lumbar curvature, elevated Lt iliac crest  PELVIC ALIGNMENT: Lt posterior rotation, Lt sacral facing rotation   LUMBARAROM/PROM:  A/PROM A/PROM  Eval (% available)  Flexion 75, pain on LT  Extension 75  Right lateral flexion 100  Left lateral flexion 100, pain in back and Lt pelvis  Right rotation 75  Left rotation 50, feels good on back   (Blank rows = not tested)   LOWER EXTREMITY MMT:  MMT Right eval Left eval  Hip abduction 5/5 5/5  Hip adduction    Hip internal rotation 3/5 4-/5  Hip external rotation 3/5 4/5   PALPATION:   General: Large mass located in Rt  abdomen lateral to umbilicus, non-tender, but with pressure it caused discomfort in low back - patient had not noticed before and reports no doctor has mentioned to her before    TODAY'S TREATMENT:                                                                                                                               DATE:  07/04/23 Manual: Trigger Point Dry Needling  Subsequent Treatment: Instructions provided previously at initial dry needling treatment.   Patient Verbal Consent Given: Yes Education Handout Provided: Previously Provided Muscles Treated: Lt hamstrings/adductors/obturator internus/glutes Electrical Stimulation Performed: No Treatment Response/Outcome: twitch response/release  Soft tissue mobilization to Lt thigh/obturator internus  Supine Lt hip lateral mobilizations and traction with mobilization strap  Therapeutic activities: HEP review for core strengthening and deep hip activation exercises with stretching Continued education on sEMG biofeedback   06/27/23 Manual: Trigger Point Dry Needling  Subsequent Treatment: Instructions provided previously at initial dry needling treatment.   Patient Verbal Consent Given: Yes Education Handout Provided: Previously Provided Muscles Treated: Lt hamstrings/adductors/obturator internus/glutes Electrical Stimulation Performed: No Treatment Response/Outcome: twitch response/release  Soft tissue mobilization to Lt thigh/obturator internus  Rt side lying long axis traction to Lt LE Supine Lt hip lateral mobilizations and traction with mobilization strap    06/22/23 Manual: Trigger Point Dry Needling  Subsequent Treatment: Instructions provided previously at initial dry needling treatment.   Patient Verbal Consent Given: Yes Education Handout Provided: Previously Provided Muscles Treated: Lt hamstrings/adductors/obturator internus Electrical Stimulation Performed: No Treatment Response/Outcome: twitch response/release  Soft tissue mobilization to Lt thigh/obturator internus  Neuromuscular re-education: Pudendal nerve glides: Cat cow with narrow knees and wide feet 2 x 10 Cobra to Child's pose with narrow knees and wide feet 2 x 10 Therapeutic activities: Lunges 10x bil Lateral lunges 10x  bil Squats 10x    PATIENT EDUCATION:  Education details: See above Person educated: Patient Education method: Explanation, Demonstration, Tactile cues, Verbal cues, and Handouts Education comprehension: verbalized understanding  HOME EXERCISE PROGRAM: [email or web address]  ASSESSMENT:  CLINICAL IMPRESSION: Pt had a significant improvement in pain this week. Believe it was a combination of hitting appropriate spots with dry needling and performing traction to Lt hip to help further improve muscular restriction. She tolerated all of these techniques again today very well. We did review HEP and discuss biofeedback and that it will be helpful to begin performing in future sessions now that her pain is better controlled. She will continue to benefit from skilled PT intervention in order to decrease pelvic pain, improve low back/Lt hip pain,decrease urinary urgency, decrease lower abdominal pain, and begin/progress functional strengthening program.   OBJECTIVE IMPAIRMENTS: decreased activity tolerance, decreased coordination, decreased endurance, decreased mobility, decreased strength, increased fascial restrictions, increased muscle spasms, impaired tone, postural dysfunction, and pain.   ACTIVITY LIMITATIONS: lifting, bending, sitting, squatting, and bed mobility  PARTICIPATION LIMITATIONS: cleaning, driving, and community activity  PERSONAL FACTORS: 1 comorbidity: medical history  are also affecting patient's functional outcome.   REHAB POTENTIAL: Good  CLINICAL DECISION MAKING: Evolving/moderate complexity  EVALUATION COMPLEXITY: Moderate   GOALS: Goals reviewed with patient? Yes  SHORT TERM GOALS: Updated 06/15/23  Pt will be independent with HEP.   Baseline: Goal status: MET 04/13/23  2.  Pt will be able to teach back and utilize urge suppression technique in order to help reduce number of trips to the bathroom.    Baseline:  Goal status: MET 05/09/23  3.  Pt will begin use of pelvic  floor muscle wand on regular basis (2-3x/week) in order to help reduce pelvic floor muscle tension and pain. Baseline:  Goal status: IN PROGRESS 06/15/23  4.  Pt will be independent with diaphragmatic breathing and down training activities in order to improve pelvic floor relaxation.  Baseline:  Goal status: MET 05/09/23  5.  Pt will report pelvic pain no greater than 4/10. Baseline:  Goal status: MET 05/09/23   LONG TERM GOALS: Updated 05/09/23  Pt will be independent with advanced HEP.   Baseline:  Goal status: IN PROGRESS 06/15/23  2.  Pt will be able to go 2-3 hours in between voids without urgency or incontinence in order to improve QOL and perform all functional activities with less difficulty.   Baseline:  Goal status: IN PROGRESS 06/15/23  3.  Pt will report pelvic pain no greater than 2/10.  Baseline:  Goal status: IN PROGRESS 06/15/23  4.  Pt will be able to sit for >30 minutes without any increased pelvic pain.  Baseline:  Goal status: IN PROGRESS 06/15/23  5.  Pt will be able to stand on single leg without pelvic drop and squat without weight shift in order to demonstrate improved functional core and hip strength to more appropriately stabilize pelvis.  Baseline:  Goal status: IN PROGRESS 06/15/23  6.  Pt will demonstrate increase in all impaired hip strength by 1 muscle grades in order to demonstrate improved lumbopelvic support and increase functional ability.   Baseline:  Goal status: IN PROGRESS 06/15/23  PLAN:  PT FREQUENCY: 1-2x/week  PT DURATION: 12 weeks  PLANNED INTERVENTIONS: 97110-Therapeutic exercises, 97530- Therapeutic activity, 97112- Neuromuscular re-education, 97535- Self Care, [id number]- Manual therapy, Dry Needling, and Biofeedback  PLAN FOR NEXT SESSION: Plan to work on breathing techniques and manual techniques to abdomen; dry needling to Lt adductors; continue pelvic floor muscle release; adductor stretches; pudendal nerve glides    Josette Mares,  PT, DPT06/24/2511:44 AM

## 2023-07-05 NOTE — Telephone Encounter (Signed)
 duplicate

## 2023-07-06 NOTE — Telephone Encounter (Signed)
 noted

## 2023-07-11 ENCOUNTER — Ambulatory Visit: Attending: Obstetrics and Gynecology

## 2023-07-11 DIAGNOSIS — M6281 Muscle weakness (generalized): Secondary | ICD-10-CM | POA: Diagnosis present

## 2023-07-11 DIAGNOSIS — M62838 Other muscle spasm: Secondary | ICD-10-CM | POA: Diagnosis present

## 2023-07-11 DIAGNOSIS — M5459 Other low back pain: Secondary | ICD-10-CM | POA: Diagnosis present

## 2023-07-11 DIAGNOSIS — R279 Unspecified lack of coordination: Secondary | ICD-10-CM | POA: Diagnosis present

## 2023-07-11 DIAGNOSIS — M25552 Pain in left hip: Secondary | ICD-10-CM | POA: Diagnosis present

## 2023-07-11 DIAGNOSIS — R293 Abnormal posture: Secondary | ICD-10-CM | POA: Diagnosis present

## 2023-07-11 DIAGNOSIS — R102 Pelvic and perineal pain: Secondary | ICD-10-CM | POA: Diagnosis present

## 2023-07-11 NOTE — Therapy (Signed)
 OUTPATIENT PHYSICAL THERAPY FEMALE PELVIC TREATMENT   Patient Name: Jessica Miles MRN: [id number] DOB:[birth year], 78 y.o., female Today's Date: 07/11/2023  END OF SESSION:  PT End of Session - 07/11/23 1044     Visit Number 27    Date for PT Re-Evaluation 08/01/23    Authorization Type Medicare - needs Kx at 15    Progress Note Due on Visit 30    PT Start Time 1045    PT Stop Time 1140    PT Time Calculation (min) 55 min    Activity Tolerance Patient tolerated treatment well    Behavior During Therapy [facility] for tasks assessed/performed           Past Medical History:  Diagnosis Date   Allergy    Anxiety    COVID-19 virus infection    09-23-2019- 9-16 had monoclonal infusion    Depression    History of IBS    HPV (human papilloma virus) infection    Hyperlipidemia    83- not taking statin    Loss of taste    Neuropathy 2015   surgery for Mortons neuroma in 1985   Osteopenia    Osteoporosis    Raynaud's disease 02/2020   Tachycardia    Thyroid  disease    Past Surgical History:  Procedure Laterality Date   ANAL RECTAL MANOMETRY N/A 05/03/2023   Procedure: CALA FERNS;  Surgeon: Wilhelmenia Aloha Raddle., MD;  Location: [facility];  Service: Gastroenterology;  Laterality: N/A;   BIOPSY  01/25/2022   Procedure: BIOPSY;  Surgeon: Wilhelmenia Aloha Raddle., MD;  Location: [facility];  Service: Gastroenterology;;   COLONOSCOPY  2019   hems only    ESOPHAGOGASTRODUODENOSCOPY (EGD) WITH PROPOFOL  N/A 01/25/2022   Procedure: ESOPHAGOGASTRODUODENOSCOPY (EGD) WITH PROPOFOL ;  Surgeon: Wilhelmenia Aloha Raddle., MD;  Location: [facility];  Service: Gastroenterology;  Laterality: N/A;   EUS N/A 01/25/2022   Procedure: UPPER ENDOSCOPIC ULTRASOUND (EUS) LINEAR;  Surgeon: Wilhelmenia Aloha Raddle., MD;  Location: [facility];  Service: Gastroenterology;  Laterality: N/A;   FOOT NEUROMA SURGERY  1985   WISDOM TOOTH EXTRACTION     Patient Active Problem List   Diagnosis Date  Noted   Abnormal laboratory test result 06/09/2023   Cardiac arrhythmia 06/09/2023   Encounter for medication refill 06/09/2023   Finding of above normal blood pressure 06/09/2023   Iatrogenic thyrotoxicosis 06/09/2023   Menopausal symptom 06/09/2023   Muscle weakness of extremity 06/09/2023   Need for vaccination 06/09/2023   Neuroma of foot 06/09/2023   Nuclear senile cataract 06/09/2023   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   Disorder of skin 06/09/2023   Encounter for routine chest x-ray 06/09/2023   Labial irritation 06/07/2023   Vertigo 03/10/2023   Right upper quadrant abdominal mass 01/26/2023   Panic attacks 10/26/2022   Allergic reaction to insect bite 06/23/2022   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   Severely underweight adult 09/07/2021   Acute diarrhea 04/27/2021   Inflamed sebaceous cyst 02/11/2021   Transient disorientation 10/20/2020   Memory change 08/05/2020   Family history of early CAD 02/27/2020   Tremor 02/27/2020   Aortic atherosclerosis (HCC) 12/19/2019   Urinary urgency 05/21/2019   Bladder irritation 05/21/2019   External hemorrhoid 05/21/2019   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   Raynaud's syndrome 02/09/2019   Osteoarthritis of thumb, 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   Fatigue 12/19/2017   Acute left-sided low back pain without sciatica 12/19/2017   SVT (supraventricular tachycardia) (HCC) 08/31/2017   Peripheral neuropathy 08/31/2017   Elbow pain 10/21/2015  Dry eyes 09/21/2015   Atopic dermatitis 03/13/2015   IBS (irritable bowel syndrome) 03/18/2014   Osteoporosis 03/18/2014   Major depressive disorder, recurrent episode, moderate (HCC) 03/18/2014   Hypothyroidism 12/14/2013    PCP: Avelina Greig BRAVO, MD  REFERRING PROVIDER: Darol Norris, MD  REFERRING DIAG: M79.18 (ICD-10-CM) - Myalgia, other site G58.8 (ICD-10-CM) - Other specified mononeuropathies  THERAPY DIAG:  Other muscle spasm  Muscle weakness (generalized)  Pain in left  hip  Other low back pain  Pelvic pain  Unspecified lack of coordination  Abnormal posture  Rationale for Evaluation and Treatment: Rehabilitation  ONSET DATE: 3 years   SUBJECTIVE:                                                                                                                                                                                           SUBJECTIVE STATEMENT: Pt states that she has had a lot of soreness throughout Lt side. She saw Dr. Darol this morning and they have decided to perform bladder stretching, pudendal nerve block, and botox all at once. She is having that done on 08/01/23. She feels like she is having an exacerbation of bladder symptoms this week and took AZO. She has been using lidocaine  patches to help with Lt pelvic pain.   PAIN: 07/11/23 Are you having pain? Yes NPRS scale: 3/10 Pain location: Lt hip and perineum/vulva; lower abdomen discomfort  Pain type: soreness Pain description: intermittent   Aggravating factors: sitting Relieving factors: lying flat, sitting on toilet  PRECAUTIONS: Other: large mass in Rt lower quadrant that has not been treated by medical team  RED FLAGS: None   WEIGHT BEARING RESTRICTIONS: No  FALLS:  Has patient fallen in last 6 months? No  LIVING ENVIRONMENT: Lives with: lives with their family Lives in: House/apartment  OCCUPATION: retired Runner, broadcasting/film/video  PLOF: Independent  PATIENT GOALS: decrease pain  PERTINENT HISTORY:  Interstitial cystitis   Updated 04/13/23:  BOWEL MOVEMENT: Pain with bowel movement: No Type of bowel movement:Frequency up to 5x/hour anywhere from solid to diarrhea; she will go 1-2 hours after eating on average and Strain No Fully empty rectum: No Leakage: Yes: seed size stool can come out without sensation when walking   Pads: No Fiber supplement: No  URINATION: Pain with urination: No Fully empty bladder: Yes: - Stream: Weak Urgency: No - but not drinking much and  goes every time she walks by the bathroom  Frequency: as often as she can - very small amounts  Leakage: none Pads: No  INTERCOURSE: NA  PREGNANCY: Vaginal deliveries 2 Tearing No C-section deliveries 0 Currently pregnant No  PROLAPSE: None  OBJECTIVE:  Note: Objective measures were completed at Evaluation unless otherwise noted. 05/09/23: Grade 1 uterine ligament laxity Significant tenderness with palpation of cervix Significant improvement with paradoxical contraction - not present anymore with lift when squeezing and appropriate lengthening with bearing down    04/25/23: Grade 1 uterine ligament laxity Significant tenderness with palpation of cervix Paradoxical pelvic floor muscle contraction with attempting to bear down to look for anterior/posterior vaginal wall laxity (could be fear of releasing gas/urine)  04/13/23:               External Perineal Exam dryness, tenderness, labial fusion bil                             Internal Pelvic Floor Pt has increased bil pelvic floor muscle tenderness and tightness; bladder restriction and reproduction of urgency with palpation; reproduction of lower abdominal pain with palpation of tight anterior Rt pelvic floor muscles  Patient confirms identification and approves PT to assess internal pelvic floor and treatment Yes   PELVIC MMT:    MMT 04/13/23  Vaginal 2/5, 5 second hold, 5 repeat contractions  (Blank rows = not tested)        TONE: High Bil  01/26/23:               External Perineal Exam dryness, tenderness, labial fusion bil                             Internal Pelvic Floor significant superficial tenderness with report of burning, pinching and stinging; muscle spasm and aching in Lt deep pelvic floor muscle   Patient confirms identification and approves PT to assess internal pelvic floor and treatment Yes   PELVIC MMT:    MMT eval  Vaginal 1/5, 4 second hold, 6 repeat contractions  (Blank rows = not  tested)        TONE: High Lt side  PROLAPSE: Not tested   01/24/23: 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, swelling of quadratus femoris muscle, tendinopathic and frayed hamstring origin Scheduled for anal manometry in April 2025   COGNITION: Overall cognitive status: Within functional limits for tasks assessed     SENSATION: Light touch: Appears intact Proprioception: Appears intact   FUNCTIONAL/SPECIAL TESTS:  Single leg stance:  Rt: more stable, some Lt drop - compensated trendelenburg  Lt: less stable, more Rt drop - compensated trendelenburg Squat: Lt weight shift, Rt valgus knee collapse FAIR: (+) Lt hip Hamstring length: significant decrease with pain on Lt, -75 degrees Curl-up test: breath holding, but no distortion, 2 finger width diastasis in upper abdominals Bed mobility: breath holding  Palpable transversus abdominus contraction bil, but weak Tenderness at Lt ischial tuberosity and hamstrings/Lt posterolateral hip mm  GAIT: Comments: WNL  POSTURE: forward head, decreased lumbar lordosis, posterior pelvic tilt, and Lt lower thoracic/upper lumbar curvature, elevated Lt iliac crest  PELVIC ALIGNMENT: Lt posterior rotation, Lt sacral facing rotation   LUMBARAROM/PROM:  A/PROM A/PROM  Eval (% available)  Flexion 75, pain on LT  Extension 75  Right lateral flexion 100  Left lateral flexion 100, pain in back and Lt pelvis  Right rotation 75  Left rotation 50, feels good on back   (Blank rows = not tested)   LOWER EXTREMITY MMT:  MMT Right eval Left eval  Hip abduction 5/5 5/5  Hip  adduction    Hip internal rotation 3/5 4-/5  Hip external rotation 3/5 4/5   PALPATION:   General: Large mass located in Rt abdomen lateral to umbilicus, non-tender, but with pressure it caused discomfort in low back - patient had not noticed before and reports no doctor has mentioned to her  before    TODAY'S TREATMENT:                                                                                                                              DATE:  07/11/23 Neuromuscular re-education: Pelvic floor muscle retraining: sEMG electrodes on perineum for biofeedback on either side of anus Relaxation program -did very well Worked on strength of contractions with diaphragmatic breathing Tactile cues on perineum and abdominals to help verify appropriate concentric/eccentric contraction coordination at correct times and to decrease abdominal bracing Exhale squeeze, inhale relax, exhale bulge, inhale relax 30x  07/04/23 Manual: Trigger Point Dry Needling  Subsequent Treatment: Instructions provided previously at initial dry needling treatment.   Patient Verbal Consent Given: Yes Education Handout Provided: Previously Provided Muscles Treated: Lt hamstrings/adductors/obturator internus/glutes Electrical Stimulation Performed: No Treatment Response/Outcome: twitch response/release  Soft tissue mobilization to Lt thigh/obturator internus  Supine Lt hip lateral mobilizations and traction with mobilization strap  Therapeutic activities: HEP review for core strengthening and deep hip activation exercises with stretching Continued education on sEMG biofeedback   06/27/23 Manual: Trigger Point Dry Needling  Subsequent Treatment: Instructions provided previously at initial dry needling treatment.   Patient Verbal Consent Given: Yes Education Handout Provided: Previously Provided Muscles Treated: Lt hamstrings/adductors/obturator internus/glutes Electrical Stimulation Performed: No Treatment Response/Outcome: twitch response/release  Soft tissue mobilization to Lt thigh/obturator internus  Rt side lying long axis traction to Lt LE Supine Lt hip lateral mobilizations and traction with mobilization strap   PATIENT EDUCATION:  Education details: See above Person educated:  Patient Education method: Programmer, multimedia, Facilities manager, Actor cues, Verbal cues, and Handouts Education comprehension: verbalized understanding  HOME EXERCISE PROGRAM: [id number]  ASSESSMENT:  CLINICAL IMPRESSION: Pt has not presented with the typical high tone pelvic floor that is commonly associated with pudendal neuralgia and interstitial cystitis. 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 In this case, we are going to start more strength training and coordination training with use of sEMG biofeedback. We are hoping that this also will help with coordination of bowel movement evacuation. Today she did not show high tone on EMG. She was able to perform series of eccentric/concentric pelvic floor muscle contractions to help with coordination and proprioception. She made some good improvements. Also used tactile feedback at perineum and abdominals in addition to EMG to optimize this. She will continue to benefit from skilled PT intervention in order to decrease pelvic pain, improve low back/Lt hip pain,decrease urinary urgency, decrease lower abdominal pain, and begin/progress functional strengthening program.  OBJECTIVE IMPAIRMENTS: decreased activity tolerance, decreased coordination, decreased endurance, decreased mobility, decreased strength, increased fascial restrictions, increased muscle spasms, impaired tone, postural dysfunction, and pain.   ACTIVITY LIMITATIONS: lifting, bending, sitting, squatting, and bed mobility  PARTICIPATION LIMITATIONS: cleaning, driving, and community activity  PERSONAL FACTORS: 1 comorbidity: medical history are also affecting patient's functional outcome.   REHAB POTENTIAL: Good  CLINICAL DECISION MAKING: Evolving/moderate complexity  EVALUATION COMPLEXITY: Moderate   GOALS: Goals reviewed with patient? Yes  SHORT TERM  GOALS: Updated 06/15/23  Pt will be independent with HEP.   Baseline: Goal status: MET 04/13/23  2.  Pt will be able to teach back and utilize urge suppression technique in order to help reduce number of trips to the bathroom.    Baseline:  Goal status: MET 05/09/23  3.  Pt will begin use of pelvic floor muscle wand on regular basis (2-3x/week) in order to help reduce pelvic floor muscle tension and pain. Baseline:  Goal status: IN PROGRESS 06/15/23  4.  Pt will be independent with diaphragmatic breathing and down training activities in order to improve pelvic floor relaxation.  Baseline:  Goal status: MET 05/09/23  5.  Pt will report pelvic pain no greater than 4/10. Baseline:  Goal status: MET 05/09/23   LONG TERM GOALS: Updated 05/09/23  Pt will be independent with advanced HEP.   Baseline:  Goal status: IN PROGRESS 06/15/23  2.  Pt will be able to go 2-3 hours in between voids without urgency or incontinence in order to improve QOL and perform all functional activities with less difficulty.   Baseline:  Goal status: IN PROGRESS 06/15/23  3.  Pt will report pelvic pain no greater than 2/10.  Baseline:  Goal status: IN PROGRESS 06/15/23  4.  Pt will be able to sit for >30 minutes without any increased pelvic pain.  Baseline:  Goal status: IN PROGRESS 06/15/23  5.  Pt will be able to stand on single leg without pelvic drop and squat without weight shift in order to demonstrate improved functional core and hip strength to more appropriately stabilize pelvis.  Baseline:  Goal status: IN PROGRESS 06/15/23  6.  Pt will demonstrate increase in all impaired hip strength by 1 muscle grades in order to demonstrate improved lumbopelvic support and increase functional ability.   Baseline:  Goal status: IN PROGRESS 06/15/23  PLAN:  PT FREQUENCY: 1-2x/week  PT DURATION: 12 weeks  PLANNED INTERVENTIONS: 97110-Therapeutic exercises, 97530- Therapeutic activity, 97112- Neuromuscular  re-education, 97535- Self Care, [id number]- Manual therapy, Dry Needling, and Biofeedback  PLAN FOR NEXT SESSION: Plan to work on breathing techniques and manual techniques to abdomen; dry needling to Lt adductors; continue pelvic floor muscle release; adductor stretches; pudendal nerve glides    Josette Mares, PT, DPT07/01/2509:41 AM

## 2023-07-13 ENCOUNTER — Encounter

## 2023-07-18 ENCOUNTER — Encounter: Payer: Self-pay | Admitting: Family Medicine

## 2023-07-18 DIAGNOSIS — R252 Cramp and spasm: Secondary | ICD-10-CM

## 2023-07-18 DIAGNOSIS — E559 Vitamin D deficiency, unspecified: Secondary | ICD-10-CM

## 2023-07-19 ENCOUNTER — Ambulatory Visit: Payer: Self-pay

## 2023-07-19 DIAGNOSIS — M62838 Other muscle spasm: Secondary | ICD-10-CM | POA: Diagnosis not present

## 2023-07-19 DIAGNOSIS — R279 Unspecified lack of coordination: Secondary | ICD-10-CM

## 2023-07-19 DIAGNOSIS — R102 Pelvic and perineal pain: Secondary | ICD-10-CM

## 2023-07-19 DIAGNOSIS — M5459 Other low back pain: Secondary | ICD-10-CM

## 2023-07-19 DIAGNOSIS — M25552 Pain in left hip: Secondary | ICD-10-CM

## 2023-07-19 DIAGNOSIS — M6281 Muscle weakness (generalized): Secondary | ICD-10-CM

## 2023-07-19 DIAGNOSIS — R293 Abnormal posture: Secondary | ICD-10-CM

## 2023-07-19 NOTE — Therapy (Signed)
 OUTPATIENT PHYSICAL THERAPY FEMALE PELVIC TREATMENT   Patient Name: Jessica Miles MRN: [id number] DOB:[birth year], 78 y.o., female Today's Date: 07/19/2023  END OF SESSION:  PT End of Session - 07/19/23 1224     Visit Number 28    Date for PT Re-Evaluation 08/01/23    Authorization Type Medicare - needs Kx at 15    Progress Note Due on Visit 30    PT Start Time 1230    PT Stop Time 1312    PT Time Calculation (min) 42 min    Activity Tolerance Patient tolerated treatment well    Behavior During Therapy [facility] for tasks assessed/performed           Past Medical History:  Diagnosis Date   Allergy    Anxiety    COVID-19 virus infection    09-23-2019- 9-16 had monoclonal infusion    Depression    History of IBS    HPV (human papilloma virus) infection    Hyperlipidemia    83- not taking statin    Loss of taste    Neuropathy 2015   surgery for Mortons neuroma in 1985   Osteopenia    Osteoporosis    Raynaud's disease 02/2020   Tachycardia    Thyroid  disease    Past Surgical History:  Procedure Laterality Date   ANAL RECTAL MANOMETRY N/A 05/03/2023   Procedure: CALA FERNS;  Surgeon: Wilhelmenia Aloha Raddle., MD;  Location: [facility];  Service: Gastroenterology;  Laterality: N/A;   BIOPSY  01/25/2022   Procedure: BIOPSY;  Surgeon: Wilhelmenia Aloha Raddle., MD;  Location: [facility];  Service: Gastroenterology;;   COLONOSCOPY  2019   hems only    ESOPHAGOGASTRODUODENOSCOPY (EGD) WITH PROPOFOL  N/A 01/25/2022   Procedure: ESOPHAGOGASTRODUODENOSCOPY (EGD) WITH PROPOFOL ;  Surgeon: Wilhelmenia Aloha Raddle., MD;  Location: [facility];  Service: Gastroenterology;  Laterality: N/A;   EUS N/A 01/25/2022   Procedure: UPPER ENDOSCOPIC ULTRASOUND (EUS) LINEAR;  Surgeon: Wilhelmenia Aloha Raddle., MD;  Location: [facility];  Service: Gastroenterology;  Laterality: N/A;   FOOT NEUROMA SURGERY  1985   WISDOM TOOTH EXTRACTION     Patient Active Problem List   Diagnosis Date  Noted   Abnormal laboratory test result 06/09/2023   Cardiac arrhythmia 06/09/2023   Encounter for medication refill 06/09/2023   Finding of above normal blood pressure 06/09/2023   Iatrogenic thyrotoxicosis 06/09/2023   Menopausal symptom 06/09/2023   Muscle weakness of extremity 06/09/2023   Need for vaccination 06/09/2023   Neuroma of foot 06/09/2023   Nuclear senile cataract 06/09/2023   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   Disorder of skin 06/09/2023   Encounter for routine chest x-ray 06/09/2023   Labial irritation 06/07/2023   Vertigo 03/10/2023   Right upper quadrant abdominal mass 01/26/2023   Panic attacks 10/26/2022   Allergic reaction to insect bite 06/23/2022   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   Severely underweight adult 09/07/2021   Acute diarrhea 04/27/2021   Inflamed sebaceous cyst 02/11/2021   Transient disorientation 10/20/2020   Memory change 08/05/2020   Family history of early CAD 02/27/2020   Tremor 02/27/2020   Aortic atherosclerosis (HCC) 12/19/2019   Urinary urgency 05/21/2019   Bladder irritation 05/21/2019   External hemorrhoid 05/21/2019   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   Raynaud's syndrome 02/09/2019   Osteoarthritis of thumb, 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   Fatigue 12/19/2017   Acute left-sided low back pain without sciatica 12/19/2017   SVT (supraventricular tachycardia) (HCC) 08/31/2017   Peripheral neuropathy 08/31/2017   Elbow pain 10/21/2015  Dry eyes 09/21/2015   Atopic dermatitis 03/13/2015   IBS (irritable bowel syndrome) 03/18/2014   Osteoporosis 03/18/2014   Major depressive disorder, recurrent episode, moderate (HCC) 03/18/2014   Hypothyroidism 12/14/2013    PCP: Avelina Greig BRAVO, MD  REFERRING PROVIDER: Darol Norris, MD  REFERRING DIAG: M79.18 (ICD-10-CM) - Myalgia, other site G58.8 (ICD-10-CM) - Other specified mononeuropathies  THERAPY DIAG:  Other muscle spasm  Muscle weakness (generalized)  Pain in left  hip  Other low back pain  Pelvic pain  Unspecified lack of coordination  Abnormal posture  Rationale for Evaluation and Treatment: Rehabilitation  ONSET DATE: 3 years   SUBJECTIVE:                                                                                                                                                                                           SUBJECTIVE STATEMENT: Pt states that she is feeling discouraged this week. 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 She also reports severe pain in Lt lower quadrant this morning that she rated 6/10.   PAIN: 07/19/23 Are you having pain? Yes NPRS scale: 6/10 Pain location: Lt hip and perineum/vulva; lower abdomen discomfort  Pain type: soreness Pain description: intermittent   Aggravating factors: sitting Relieving factors: lying flat, sitting on toilet  PRECAUTIONS: Other: large mass in Rt lower quadrant that has not been treated by medical team  RED FLAGS: None   WEIGHT BEARING RESTRICTIONS: No  FALLS:  Has patient fallen in last 6 months? No  LIVING ENVIRONMENT: Lives with: lives with their family Lives in: House/apartment  OCCUPATION: retired Runner, broadcasting/film/video  PLOF: Independent  PATIENT GOALS: decrease pain  PERTINENT HISTORY:  Interstitial cystitis   Updated 04/13/23:  BOWEL MOVEMENT: Pain with bowel movement: No Type of bowel movement:Frequency up to 5x/hour anywhere from solid to diarrhea; she will go 1-2 hours after eating on average and Strain No Fully empty rectum: No Leakage: Yes: seed size stool can come out without sensation when walking   Pads: No Fiber supplement: No  URINATION: Pain with urination: No Fully empty bladder: Yes: - Stream: Weak Urgency: No - but not drinking much and goes every time she walks by the bathroom  Frequency: as often as she can - very small amounts  Leakage: none Pads:  No  INTERCOURSE: NA  PREGNANCY: Vaginal deliveries 2 Tearing No C-section deliveries 0 Currently pregnant No  PROLAPSE: None   OBJECTIVE:  Note: Objective measures were completed at Evaluation unless otherwise noted. 05/09/23: Grade 1 uterine ligament laxity Significant tenderness with palpation of cervix Significant improvement with paradoxical contraction -  not present anymore with lift when squeezing and appropriate lengthening with bearing down    04/25/23: Grade 1 uterine ligament laxity Significant tenderness with palpation of cervix Paradoxical pelvic floor muscle contraction with attempting to bear down to look for anterior/posterior vaginal wall laxity (could be fear of releasing gas/urine)  04/13/23:               External Perineal Exam dryness, tenderness, labial fusion bil                             Internal Pelvic Floor Pt has increased bil pelvic floor muscle tenderness and tightness; bladder restriction and reproduction of urgency with palpation; reproduction of lower abdominal pain with palpation of tight anterior Rt pelvic floor muscles  Patient confirms identification and approves PT to assess internal pelvic floor and treatment Yes   PELVIC MMT:    MMT 04/13/23  Vaginal 2/5, 5 second hold, 5 repeat contractions  (Blank rows = not tested)        TONE: High Bil  01/26/23:               External Perineal Exam dryness, tenderness, labial fusion bil                             Internal Pelvic Floor significant superficial tenderness with report of burning, pinching and stinging; muscle spasm and aching in Lt deep pelvic floor muscle   Patient confirms identification and approves PT to assess internal pelvic floor and treatment Yes   PELVIC MMT:    MMT eval  Vaginal 1/5, 4 second hold, 6 repeat contractions  (Blank rows = not tested)        TONE: High Lt side  PROLAPSE: Not tested   01/24/23: DIAGNOSTIC FINDINGS:  CT Lt hip: Mild OA,  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, swelling of quadratus femoris muscle, tendinopathic and frayed hamstring origin Scheduled for anal manometry in April 2025   COGNITION: Overall cognitive status: Within functional limits for tasks assessed     SENSATION: Light touch: Appears intact Proprioception: Appears intact   FUNCTIONAL/SPECIAL TESTS:  Single leg stance:  Rt: more stable, some Lt drop - compensated trendelenburg  Lt: less stable, more Rt drop - compensated trendelenburg Squat: Lt weight shift, Rt valgus knee collapse FAIR: (+) Lt hip Hamstring length: significant decrease with pain on Lt, -75 degrees Curl-up test: breath holding, but no distortion, 2 finger width diastasis in upper abdominals Bed mobility: breath holding  Palpable transversus abdominus contraction bil, but weak Tenderness at Lt ischial tuberosity and hamstrings/Lt posterolateral hip mm  GAIT: Comments: WNL  POSTURE: forward head, decreased lumbar lordosis, posterior pelvic tilt, and Lt lower thoracic/upper lumbar curvature, elevated Lt iliac crest  PELVIC ALIGNMENT: Lt posterior rotation, Lt sacral facing rotation   LUMBARAROM/PROM:  A/PROM A/PROM  Eval (% available)  Flexion 75, pain on LT  Extension 75  Right lateral flexion 100  Left lateral flexion 100, pain in back and Lt pelvis  Right rotation 75  Left rotation 50, feels good on back   (Blank rows = not tested)   LOWER EXTREMITY MMT:  MMT Right eval Left eval  Hip abduction 5/5 5/5  Hip adduction    Hip internal rotation 3/5 4-/5  Hip external rotation 3/5 4/5   PALPATION:   General: Large mass located in Rt abdomen lateral to umbilicus,  non-tender, but with pressure it caused discomfort in low back - patient had not noticed before and reports no doctor has mentioned to her before    TODAY'S TREATMENT:                                                                                                                               DATE:  07/19/23 Therapeutic activities: Diagram to help with pain neuroscience education involving multiple diagnosis and conditions Review: Urge drill Voiding schedule to help with IC Mindfulness body scan Abdominal massage Being consistent with medications that MD has prescribed, such as estrogen cream and miralax    07/11/23 Neuromuscular re-education: Pelvic floor muscle retraining: sEMG electrodes on perineum for biofeedback on either side of anus Relaxation program -did very well Worked on strength of contractions with diaphragmatic breathing Tactile cues on perineum and abdominals to help verify appropriate concentric/eccentric contraction coordination at correct times and to decrease abdominal bracing Exhale squeeze, inhale relax, exhale bulge, inhale relax 30x  07/04/23 Manual: Trigger Point Dry Needling  Subsequent Treatment: Instructions provided previously at initial dry needling treatment.   Patient Verbal Consent Given: Yes Education Handout Provided: Previously Provided Muscles Treated: Lt hamstrings/adductors/obturator internus/glutes Electrical Stimulation Performed: No Treatment Response/Outcome: twitch response/release  Soft tissue mobilization to Lt thigh/obturator internus  Supine Lt hip lateral mobilizations and traction with mobilization strap  Therapeutic activities: HEP review for core strengthening and deep hip activation exercises with stretching Continued education on sEMG biofeedback  PATIENT EDUCATION:  Education details: See above Person educated: Patient Education method: Programmer, multimedia, Demonstration, Actor cues, Verbal cues, and Handouts Education comprehension: verbalized understanding  HOME EXERCISE PROGRAM: [email or web address]  ASSESSMENT:  CLINICAL IMPRESSION: Due to pt being upset and discouraged today, pain neuroscience education continued. We focused on all problems and made a diagram. Each problem with rated on a  0-10 bothersome scale. Problems that were bothering her less and she was performing things to help managed were taken off her plate of things she was allowed to worry about; we discussed not focusing her energy on lower priority items right now. Interstitial cystitis and bowel movement issues were the most bothersome. We reviewed what she is currently doing for these issues and what she can do to help improve symptoms that she is not doing currently. In doing this, we discovered that she had stopped using topical estrogen cream vaginally around the same time that pudendal neuralgia and interstitial cystitis symptoms increased; she was encouraged tofollow MD instructions for taking this medication as it may be very helpful for these two problems and stopping may be responsible for her increase in discomfort. We also reviewed mindfulness, abdominal massage, voiding schedule, and urge drill as very helpful things that she can work on to help her feel better. Pt reported feeling better at end of session and felt confident with homework. She will continue to benefit from skilled PT intervention in order to decrease pelvic pain, improve low back/Lt hip  pain,decrease urinary urgency, decrease lower abdominal pain, and begin/progress functional strengthening program.   OBJECTIVE IMPAIRMENTS: decreased activity tolerance, decreased coordination, decreased endurance, decreased mobility, decreased strength, increased fascial restrictions, increased muscle spasms, impaired tone, postural dysfunction, and pain.   ACTIVITY LIMITATIONS: lifting, bending, sitting, squatting, and bed mobility  PARTICIPATION LIMITATIONS: cleaning, driving, and community activity  PERSONAL FACTORS: 1 comorbidity: medical history are also affecting patient's functional outcome.   REHAB POTENTIAL: Good  CLINICAL DECISION MAKING: Evolving/moderate complexity  EVALUATION COMPLEXITY: Moderate   GOALS: Goals reviewed with patient?  Yes  SHORT TERM GOALS: Updated 06/15/23  Pt will be independent with HEP.   Baseline: Goal status: MET 04/13/23  2.  Pt will be able to teach back and utilize urge suppression technique in order to help reduce number of trips to the bathroom.    Baseline:  Goal status: MET 05/09/23  3.  Pt will begin use of pelvic floor muscle wand on regular basis (2-3x/week) in order to help reduce pelvic floor muscle tension and pain. Baseline:  Goal status: IN PROGRESS 06/15/23  4.  Pt will be independent with diaphragmatic breathing and down training activities in order to improve pelvic floor relaxation.  Baseline:  Goal status: MET 05/09/23  5.  Pt will report pelvic pain no greater than 4/10. Baseline:  Goal status: MET 05/09/23   LONG TERM GOALS: Updated 07/19/23  Pt will be independent with advanced HEP.   Baseline:  Goal status: IN PROGRESS 07/19/23  2.  Pt will be able to go 2-3 hours in between voids without urgency or incontinence in order to improve QOL and perform all functional activities with less difficulty.   Baseline:  Goal status: IN PROGRESS 07/19/23  3.  Pt will report pelvic pain no greater than 2/10.  Baseline:  Goal status: IN PROGRESS 07/19/23  4.  Pt will be able to sit for >30 minutes without any increased pelvic pain.  Baseline:  Goal status: IN PROGRESS 07/19/23  5.  Pt will be able to stand on single leg without pelvic drop and squat without weight shift in order to demonstrate improved functional core and hip strength to more appropriately stabilize pelvis.  Baseline:  Goal status: IN PROGRESS 07/19/23  6.  Pt will demonstrate increase in all impaired hip strength by 1 muscle grades in order to demonstrate improved lumbopelvic support and increase functional ability.   Baseline:  Goal status: IN PROGRESS 07/19/23  PLAN:  PT FREQUENCY: 1-2x/week  PT DURATION: 12 weeks  PLANNED INTERVENTIONS: 97110-Therapeutic exercises, 97530- Therapeutic activity, 97112-  Neuromuscular re-education, 97535- Self Care, [id number]- Manual therapy, Dry Needling, and Biofeedback  PLAN FOR NEXT SESSION: Plan to work on breathing techniques and manual techniques to abdomen; dry needling to Lt adductors; continue pelvic floor muscle release; adductor stretches; pudendal nerve glides    Josette Mares, PT, DPT07/09/251:46 PM

## 2023-07-20 ENCOUNTER — Ambulatory Visit: Payer: Self-pay | Admitting: Family Medicine

## 2023-07-20 ENCOUNTER — Other Ambulatory Visit (INDEPENDENT_AMBULATORY_CARE_PROVIDER_SITE_OTHER)

## 2023-07-20 DIAGNOSIS — E559 Vitamin D deficiency, unspecified: Secondary | ICD-10-CM

## 2023-07-20 DIAGNOSIS — R252 Cramp and spasm: Secondary | ICD-10-CM | POA: Diagnosis not present

## 2023-07-20 LAB — BASIC METABOLIC PANEL WITH GFR
BUN: 25 mg/dL — ABNORMAL HIGH (ref 6–23)
CO2: 32 meq/L (ref 19–32)
Calcium: 9.6 mg/dL (ref 8.4–10.5)
Chloride: 97 meq/L (ref 96–112)
Creatinine, Ser: 1.18 mg/dL (ref 0.40–1.20)
GFR: 43.94 mL/min — ABNORMAL LOW (ref >60.00)
Glucose, Bld: 94 mg/dL (ref 70–99)
Potassium: 4.1 meq/L (ref 3.5–5.1)
Sodium: 136 meq/L (ref 135–145)

## 2023-07-20 LAB — T4, FREE: Free T4: 0.95 ng/dL (ref 0.60–1.60)

## 2023-07-20 LAB — VITAMIN D 25 HYDROXY (VIT D DEFICIENCY, FRACTURES): VITD: 70.29 ng/mL (ref 30.00–100.00)

## 2023-07-20 LAB — T3, FREE: T3, Free: 2.1 pg/mL — ABNORMAL LOW (ref 2.3–4.2)

## 2023-07-20 LAB — TSH: TSH: 2.65 u[IU]/mL (ref 0.35–5.50)

## 2023-07-20 LAB — MAGNESIUM: Magnesium: 2 mg/dL (ref 1.5–2.5)

## 2023-07-21 ENCOUNTER — Telehealth: Payer: Self-pay | Admitting: Gastroenterology

## 2023-07-21 MED ORDER — LEVOTHYROXINE SODIUM 112 MCG PO TABS: 112.0000 ug | ORAL_TABLET | Freq: Every day | ORAL | 3 refills | Status: DC

## 2023-07-21 MED ORDER — LEVOTHYROXINE SODIUM 112 MCG PO TABS: 112.0000 ug | ORAL_TABLET | Freq: Every day | ORAL | 0 refills | Status: DC

## 2023-07-21 MED ORDER — LEVOTHYROXINE SODIUM 112 MCG PO TABS: 112.0000 ug | ORAL_TABLET | Freq: Every day | ORAL | Status: DC

## 2023-07-21 NOTE — Telephone Encounter (Signed)
 PT is calling in with concerns about her weight loss. She is back to 99lbs and she is seeing a nutritionist. She feels her body is not absorbing food. Requesting to discuss her symptoms. Please advise.

## 2023-07-21 NOTE — Telephone Encounter (Signed)
 The pt called to ask about returning back to the practice after seeing [facility] on 07/12/2023.  This is the 2nd time she has left the practice and wants to return.  She has been seen by [facility] in the past as well.  She states she has the same concerns she has had for years including weight loss.  She was referred to a nutritionist on 7/2 by [facility].  She states  that Dr Kristene at [facility] has done nothing to help me    Will you accept the pt back to the practice?

## 2023-07-21 NOTE — Telephone Encounter (Signed)
 Copied from CRM [id number]. Topic: Clinical - Medication Question >> Jul 21, 2023  1:06 PM Jessica Miles wrote: Reason for CRM: Patient is requesting a follow up message or call to inform her if Dr. Avelina has increased her levothyroxine  or changed the dosage. Call back [phone].

## 2023-07-22 NOTE — Telephone Encounter (Signed)
 Ideally, she made a choice to go and seek care at a different facility already. Would normally not go ahead and let her return to clinic with us  as [facility].   However, she already has an appointment scheduled for next week, so since it is already on the books, she may keep that. Should she decide that the care she is receiving is not adequate, she can certainly return to the [facility] center.  GM

## 2023-07-24 ENCOUNTER — Ambulatory Visit: Payer: Self-pay

## 2023-07-24 NOTE — Telephone Encounter (Signed)
 Dr. Avelina has a cancellation on 07/25/23 at 8:20 am.  Called and moved Jessica Miles up to that appointment slot.

## 2023-07-24 NOTE — Telephone Encounter (Signed)
 FYI Only or Action Required?: Action required by provider: request for appointment.  Patient was last seen in primary care on 06/07/2023 by Avelina Greig BRAVO, MD.  Called Nurse Triage reporting Joint Pain.  Symptoms began several months ago.  Interventions attempted: Nothing.  Symptoms are: gradually worsening.  Triage Disposition: See PCP When Office is Open (Within 3 Days)  Patient/caregiver understands and will follow disposition?: Yes, will follow disposition  Copied from CRM [id number]. Topic: Clinical - Red Word Triage >> Jul 24, 2023  9:37 AM Carlatta H wrote: [facility] that prompted transfer to Nurse Triage: Patient is currently having really bad cramps in all joints   ----------------------------------------------------------------------- From previous Reason for Contact - Scheduling: Patient/patient representative is calling to schedule an appointment. Refer to attachments for appointment information. Reason for Disposition  Nursing judgment or information in reference  Answer Assessment - Initial Assessment Questions 1. REASON FOR CALL: What is your main concern right now?     Joint pain 2. ONSET: When did the joint pain start?     Ongoing, worse over the past few weeks per pt 3. SEVERITY: How bad is the joint pain?     7 6. FEVER: Do you have a fever?     denies 7. OTHER SYMPTOMS: Do you have any other new symptoms?     Denies redness/swelling, denies SOB/CP. Pt states that she needs to see MD Bedsole today, no appts today, advised ED-pt refused stating that she needs to see someone who knows her.  8. TREATMENTS AND RESPONSE: What have you done so far to try to make this better? What medicines have you used?     Pt states that she cannot take OTC pain medications because of digestive issues  Protocols used: No Guideline Available-A-AH

## 2023-07-24 NOTE — Telephone Encounter (Signed)
 There are no available appointment with Dr. Avelina sooner than Wednesday when patient is scheduled.

## 2023-07-24 NOTE — Telephone Encounter (Signed)
 The pt has been advised that she can keep appt as planned with our office.  She has agreed and will come in on 7/16

## 2023-07-25 ENCOUNTER — Ambulatory Visit (INDEPENDENT_AMBULATORY_CARE_PROVIDER_SITE_OTHER): Admitting: Family Medicine

## 2023-07-25 ENCOUNTER — Encounter: Payer: Self-pay | Admitting: Family Medicine

## 2023-07-25 ENCOUNTER — Ambulatory Visit

## 2023-07-25 VITALS — BP 130/62 | HR 79 | Temp 98.0°F | Ht 66.0 in | Wt 103.5 lb

## 2023-07-25 DIAGNOSIS — R293 Abnormal posture: Secondary | ICD-10-CM

## 2023-07-25 DIAGNOSIS — M62838 Other muscle spasm: Secondary | ICD-10-CM | POA: Diagnosis not present

## 2023-07-25 DIAGNOSIS — M25552 Pain in left hip: Secondary | ICD-10-CM

## 2023-07-25 DIAGNOSIS — R252 Cramp and spasm: Secondary | ICD-10-CM | POA: Diagnosis not present

## 2023-07-25 DIAGNOSIS — R102 Pelvic and perineal pain: Secondary | ICD-10-CM

## 2023-07-25 DIAGNOSIS — M5459 Other low back pain: Secondary | ICD-10-CM

## 2023-07-25 DIAGNOSIS — R279 Unspecified lack of coordination: Secondary | ICD-10-CM

## 2023-07-25 DIAGNOSIS — M6281 Muscle weakness (generalized): Secondary | ICD-10-CM

## 2023-07-25 LAB — CBC WITH DIFFERENTIAL/PLATELET
Basophils Absolute: 0.1 K/uL (ref 0.0–0.1)
Basophils Relative: 1.3 % (ref 0.0–3.0)
Eosinophils Absolute: 0.1 K/uL (ref 0.0–0.7)
Eosinophils Relative: 1 % (ref 0.0–5.0)
HCT: 38.3 % (ref 36.0–46.0)
Hemoglobin: 12.8 g/dL (ref 12.0–15.0)
Lymphocytes Relative: 25.2 % (ref 12.0–46.0)
Lymphs Abs: 1.4 K/uL (ref 0.7–4.0)
MCHC: 33.5 g/dL (ref 30.0–36.0)
MCV: 93 fl (ref 78.0–100.0)
Monocytes Absolute: 0.4 K/uL (ref 0.1–1.0)
Monocytes Relative: 7.2 % (ref 3.0–12.0)
Neutro Abs: 3.6 K/uL (ref 1.4–7.7)
Neutrophils Relative %: 65.3 % (ref 43.0–77.0)
Platelets: 235 K/uL (ref 150.0–400.0)
RBC: 4.12 Mil/uL (ref 3.87–5.11)
RDW: 13.5 % (ref 11.5–15.5)
WBC: 5.6 K/uL (ref 4.0–10.5)

## 2023-07-25 LAB — SEDIMENTATION RATE: Sed Rate: 1 mm/h (ref 0–30)

## 2023-07-25 LAB — CK: Total CK: 469 U/L — ABNORMAL HIGH (ref 17–177)

## 2023-07-25 LAB — VITAMIN B12: Vitamin B-12: 540 pg/mL (ref 211–911)

## 2023-07-25 MED ORDER — ALPRAZOLAM 0.25 MG PO TABS: 0.2500 mg | ORAL_TABLET | Freq: Every evening | ORAL | 0 refills | Status: DC | PRN

## 2023-07-25 NOTE — Therapy (Signed)
 OUTPATIENT PHYSICAL THERAPY FEMALE PELVIC TREATMENT   Patient Name: Jessica Miles MRN: [id number] DOB:1945/01/25, 78 y.o., female Today's Date: 07/25/2023  END OF SESSION:  PT End of Session - 07/25/23 1131     Visit Number 29    Date for PT Re-Evaluation 08/01/23    Authorization Type Medicare - needs Kx at 15    Progress Note Due on Visit 30    PT Start Time 1100    PT Stop Time 1142    PT Time Calculation (min) 42 min    Activity Tolerance Patient tolerated treatment well    Behavior During Therapy [facility] for tasks assessed/performed            Past Medical History:  Diagnosis Date   Allergy    Anxiety    COVID-19 virus infection    09-23-2019- 9-16 had monoclonal infusion    Depression    History of IBS    HPV (human papilloma virus) infection    Hyperlipidemia    83- not taking statin    Loss of taste    Neuropathy 2015   surgery for Mortons neuroma in 1985   Osteopenia    Osteoporosis    Raynaud's disease 02/2020   Tachycardia    Thyroid  disease    Past Surgical History:  Procedure Laterality Date   ANAL RECTAL MANOMETRY N/A 05/03/2023   Procedure: CALA FERNS;  Surgeon: Wilhelmenia Aloha Raddle., MD;  Location: [facility];  Service: Gastroenterology;  Laterality: N/A;   BIOPSY  01/25/2022   Procedure: BIOPSY;  Surgeon: Wilhelmenia Aloha Raddle., MD;  Location: [facility];  Service: Gastroenterology;;   COLONOSCOPY  2019   hems only    ESOPHAGOGASTRODUODENOSCOPY (EGD) WITH PROPOFOL  N/A 01/25/2022   Procedure: ESOPHAGOGASTRODUODENOSCOPY (EGD) WITH PROPOFOL ;  Surgeon: Wilhelmenia Aloha Raddle., MD;  Location: [facility];  Service: Gastroenterology;  Laterality: N/A;   EUS N/A 01/25/2022   Procedure: UPPER ENDOSCOPIC ULTRASOUND (EUS) LINEAR;  Surgeon: Wilhelmenia Aloha Raddle., MD;  Location: [facility];  Service: Gastroenterology;  Laterality: N/A;   FOOT NEUROMA SURGERY  1985   WISDOM TOOTH EXTRACTION     Patient Active Problem List   Diagnosis Date  Noted   Muscle cramping 07/25/2023   Abnormal laboratory test result 06/09/2023   Cardiac arrhythmia 06/09/2023   Encounter for medication refill 06/09/2023   Finding of above normal blood pressure 06/09/2023   Iatrogenic thyrotoxicosis 06/09/2023   Menopausal symptom 06/09/2023   Muscle weakness of extremity 06/09/2023   Need for vaccination 06/09/2023   Neuroma of foot 06/09/2023   Nuclear senile cataract 06/09/2023   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   Disorder of skin 06/09/2023   Encounter for routine chest x-ray 06/09/2023   Labial irritation 06/07/2023   Vertigo 03/10/2023   Right upper quadrant abdominal mass 01/26/2023   Panic attacks 10/26/2022   Allergic reaction to insect bite 06/23/2022   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   Severely underweight adult 09/07/2021   Acute diarrhea 04/27/2021   Inflamed sebaceous cyst 02/11/2021   Transient disorientation 10/20/2020   Memory change 08/05/2020   Family history of early CAD 02/27/2020   Tremor 02/27/2020   Aortic atherosclerosis (HCC) 12/19/2019   Urinary urgency 05/21/2019   Bladder irritation 05/21/2019   External hemorrhoid 05/21/2019   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   Raynaud's syndrome 02/09/2019   Osteoarthritis of thumb, 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   Fatigue 12/19/2017   Acute left-sided low back pain without sciatica 12/19/2017   SVT (supraventricular tachycardia) (HCC) 08/31/2017   Peripheral neuropathy  08/31/2017   Elbow pain 10/21/2015   Dry eyes 09/21/2015   Atopic dermatitis 03/13/2015   IBS (irritable bowel syndrome) 03/18/2014   Osteoporosis 03/18/2014   Major depressive disorder, recurrent episode, moderate (HCC) 03/18/2014   Hypothyroidism 12/14/2013    PCP: Avelina Greig BRAVO, MD  REFERRING PROVIDER: Darol Norris, MD  REFERRING DIAG: M79.18 (ICD-10-CM) - Myalgia, other site G58.8 (ICD-10-CM) - Other specified mononeuropathies  THERAPY DIAG:  Other muscle spasm  Muscle weakness  (generalized)  Pain in left hip  Other low back pain  Pelvic pain  Unspecified lack of coordination  Abnormal posture  Rationale for Evaluation and Treatment: Rehabilitation  ONSET DATE: 3 years   SUBJECTIVE:                                                                                                                                                                                           SUBJECTIVE STATEMENT: Pt states that cramping in her LE and hands has gotten worse. She had 3 nights in which she could not sleep because of this. She went to see primary care this morning for this; she had blood work taken and is waiting for results. She is going to start a very small dose of Valium at night to see if this does not help with the pain of cramping.   PAIN: 07/25/23 Are you having pain? Yes NPRS scale: 6/10 Pain location: Lt hip and perineum/vulva; lower abdomen discomfort  Pain type: soreness Pain description: intermittent   Aggravating factors: sitting Relieving factors: lying flat, sitting on toilet  PRECAUTIONS: Other: large mass in Rt lower quadrant that has not been treated by medical team  RED FLAGS: None   WEIGHT BEARING RESTRICTIONS: No  FALLS:  Has patient fallen in last 6 months? No  LIVING ENVIRONMENT: Lives with: lives with their family Lives in: House/apartment  OCCUPATION: retired Runner, broadcasting/film/video  PLOF: Independent  PATIENT GOALS: decrease pain  PERTINENT HISTORY:  Interstitial cystitis   Updated 04/13/23:  BOWEL MOVEMENT: Pain with bowel movement: No Type of bowel movement:Frequency up to 5x/hour anywhere from solid to diarrhea; she will go 1-2 hours after eating on average and Strain No Fully empty rectum: No Leakage: Yes: seed size stool can come out without sensation when walking   Pads: No Fiber supplement: No  URINATION: Pain with urination: No Fully empty bladder: Yes: - Stream: Weak Urgency: No - but not drinking much and goes every  time she walks by the bathroom  Frequency: as often as she can - very small amounts  Leakage: none Pads: No  INTERCOURSE: NA  PREGNANCY: Vaginal deliveries 2 Tearing No C-section  deliveries 0 Currently pregnant No  PROLAPSE: None   OBJECTIVE:  Note: Objective measures were completed at Evaluation unless otherwise noted. 05/09/23: Grade 1 uterine ligament laxity Significant tenderness with palpation of cervix Significant improvement with paradoxical contraction - not present anymore with lift when squeezing and appropriate lengthening with bearing down    04/25/23: Grade 1 uterine ligament laxity Significant tenderness with palpation of cervix Paradoxical pelvic floor muscle contraction with attempting to bear down to look for anterior/posterior vaginal wall laxity (could be fear of releasing gas/urine)  04/13/23:               External Perineal Exam dryness, tenderness, labial fusion bil                             Internal Pelvic Floor Pt has increased bil pelvic floor muscle tenderness and tightness; bladder restriction and reproduction of urgency with palpation; reproduction of lower abdominal pain with palpation of tight anterior Rt pelvic floor muscles  Patient confirms identification and approves PT to assess internal pelvic floor and treatment Yes   PELVIC MMT:    MMT 04/13/23  Vaginal 2/5, 5 second hold, 5 repeat contractions  (Blank rows = not tested)        TONE: High Bil  01/26/23:               External Perineal Exam dryness, tenderness, labial fusion bil                             Internal Pelvic Floor significant superficial tenderness with report of burning, pinching and stinging; muscle spasm and aching in Lt deep pelvic floor muscle   Patient confirms identification and approves PT to assess internal pelvic floor and treatment Yes   PELVIC MMT:    MMT eval  Vaginal 1/5, 4 second hold, 6 repeat contractions  (Blank rows = not tested)         TONE: High Lt side  PROLAPSE: Not tested   01/24/23: 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, swelling of quadratus femoris muscle, tendinopathic and frayed hamstring origin Scheduled for anal manometry in April 2025   COGNITION: Overall cognitive status: Within functional limits for tasks assessed     SENSATION: Light touch: Appears intact Proprioception: Appears intact   FUNCTIONAL/SPECIAL TESTS:  Single leg stance:  Rt: more stable, some Lt drop - compensated trendelenburg  Lt: less stable, more Rt drop - compensated trendelenburg Squat: Lt weight shift, Rt valgus knee collapse FAIR: (+) Lt hip Hamstring length: significant decrease with pain on Lt, -75 degrees Curl-up test: breath holding, but no distortion, 2 finger width diastasis in upper abdominals Bed mobility: breath holding  Palpable transversus abdominus contraction bil, but weak Tenderness at Lt ischial tuberosity and hamstrings/Lt posterolateral hip mm  GAIT: Comments: WNL  POSTURE: forward head, decreased lumbar lordosis, posterior pelvic tilt, and Lt lower thoracic/upper lumbar curvature, elevated Lt iliac crest  PELVIC ALIGNMENT: Lt posterior rotation, Lt sacral facing rotation   LUMBARAROM/PROM:  A/PROM A/PROM  Eval (% available)  Flexion 75, pain on LT  Extension 75  Right lateral flexion 100  Left lateral flexion 100, pain in back and Lt pelvis  Right rotation 75  Left rotation 50, feels good on back   (Blank rows = not tested)   LOWER EXTREMITY MMT:  MMT Right  eval Left eval  Hip abduction 5/5 5/5  Hip adduction    Hip internal rotation 3/5 4-/5  Hip external rotation 3/5 4/5   PALPATION:   General: Large mass located in Rt abdomen lateral to umbilicus, non-tender, but with pressure it caused discomfort in low back - patient had not noticed before and reports no doctor has mentioned to her before    TODAY'S  TREATMENT:                                                                                                                              DATE:  07/25/23 Manual: Soft tissue mobilization to bil posterior LE with rhythmic rotation  Neuromuscular re-education: Vibration plate, relax setting 60 seconds standing with diaphragmatic breathing  60 seconds x 2 standing with hip circles bil directions Seated 60 seconds with pelvic tilts and forward fold  Exercises: Supine waterfall pose (hips on yoga block with legs straight up in air) 4 minutes Butterfly 4 minutes Therapeutic activities: Pt education on getting in water to provide whole body compression and down training Performing some sort of fun activity to help improve outlook and provide distraction   07/19/23 Therapeutic activities: Diagram to help with pain neuroscience education involving multiple diagnosis and conditions Review: Urge drill Voiding schedule to help with IC Mindfulness body scan Abdominal massage Being consistent with medications that MD has prescribed, such as estrogen cream and miralax    07/11/23 Neuromuscular re-education: Pelvic floor muscle retraining: sEMG electrodes on perineum for biofeedback on either side of anus Relaxation program -did very well Worked on strength of contractions with diaphragmatic breathing Tactile cues on perineum and abdominals to help verify appropriate concentric/eccentric contraction coordination at correct times and to decrease abdominal bracing Exhale squeeze, inhale relax, exhale bulge, inhale relax 30x    PATIENT EDUCATION:  Education details: See above Person educated: Patient Education method: Explanation, Demonstration, Tactile cues, Verbal cues, and Handouts Education comprehension: verbalized understanding  HOME EXERCISE PROGRAM: [email or web address]  ASSESSMENT:  CLINICAL IMPRESSION: Pt has been having very difficulty time with cramping. She was encouraged to check with  neurologist to see if she had imaging of her neck to rule out any contribution since it is LE/UE. We preformed a lot of down training today, using vibration plate and inversions. She tolerated everything very well. She had significant trigger points in posterior calves, but tolerated soft tissue mobilization well with improved tightness and discomfort. She was strongly encouraged to try performing some exercise in the water and finding something she enjoys. She will continue to benefit from skilled PT intervention in order to decrease pelvic pain, improve low back/Lt hip pain,decrease urinary urgency, decrease lower abdominal pain, and begin/progress functional strengthening program.   OBJECTIVE IMPAIRMENTS: decreased activity tolerance, decreased coordination, decreased endurance, decreased mobility, decreased strength, increased fascial restrictions, increased muscle spasms, impaired tone, postural dysfunction, and pain.   ACTIVITY LIMITATIONS: lifting, bending, sitting, squatting, and bed mobility  PARTICIPATION LIMITATIONS: cleaning,  driving, and community activity  PERSONAL FACTORS: 1 comorbidity: medical history are also affecting patient's functional outcome.   REHAB POTENTIAL: Good  CLINICAL DECISION MAKING: Evolving/moderate complexity  EVALUATION COMPLEXITY: Moderate   GOALS: Goals reviewed with patient? Yes  SHORT TERM GOALS: Updated 06/15/23  Pt will be independent with HEP.   Baseline: Goal status: MET 04/13/23  2.  Pt will be able to teach back and utilize urge suppression technique in order to help reduce number of trips to the bathroom.    Baseline:  Goal status: MET 05/09/23  3.  Pt will begin use of pelvic floor muscle wand on regular basis (2-3x/week) in order to help reduce pelvic floor muscle tension and pain. Baseline:  Goal status: IN PROGRESS 06/15/23  4.  Pt will be independent with diaphragmatic breathing and down training activities in order to improve pelvic  floor relaxation.  Baseline:  Goal status: MET 05/09/23  5.  Pt will report pelvic pain no greater than 4/10. Baseline:  Goal status: MET 05/09/23   LONG TERM GOALS: Updated 07/19/23  Pt will be independent with advanced HEP.   Baseline:  Goal status: IN PROGRESS 07/19/23  2.  Pt will be able to go 2-3 hours in between voids without urgency or incontinence in order to improve QOL and perform all functional activities with less difficulty.   Baseline:  Goal status: IN PROGRESS 07/19/23  3.  Pt will report pelvic pain no greater than 2/10.  Baseline:  Goal status: IN PROGRESS 07/19/23  4.  Pt will be able to sit for >30 minutes without any increased pelvic pain.  Baseline:  Goal status: IN PROGRESS 07/19/23  5.  Pt will be able to stand on single leg without pelvic drop and squat without weight shift in order to demonstrate improved functional core and hip strength to more appropriately stabilize pelvis.  Baseline:  Goal status: IN PROGRESS 07/19/23  6.  Pt will demonstrate increase in all impaired hip strength by 1 muscle grades in order to demonstrate improved lumbopelvic support and increase functional ability.   Baseline:  Goal status: IN PROGRESS 07/19/23  PLAN:  PT FREQUENCY: 1-2x/week  PT DURATION: 12 weeks  PLANNED INTERVENTIONS: 97110-Therapeutic exercises, 97530- Therapeutic activity, 97112- Neuromuscular re-education, 97535- Self Care, [id number]- Manual therapy, Dry Needling, and Biofeedback  PLAN FOR NEXT SESSION: Plan to work on breathing techniques and manual techniques to abdomen; dry needling to Lt adductors; continue pelvic floor muscle release; adductor stretches; pudendal nerve glides    Josette Mares, PT, DPT07/15/2511:32 AM

## 2023-07-25 NOTE — Assessment & Plan Note (Signed)
 Acute, new issue for the patient. She is continuing to struggle with GI issues and does have diarrhea although flare of this does not seem to be associated with recent muscle cramping. Evaluation of thyroid  did reveal hypothyroidism so I have increased her dose of levothyroxine  212 mcg daily.  This may take more time to notice an effect if it is contributing to her the leg cramps. She appears well-hydrated and drinks a lot of water. She is likely somewhat malnourished given possible decreased absorption of nutrients. No clear low back source. No clear medication side effect Will evaluate with additional labs for atypical causes of leg cramps.  She is very overwhelmed and anxious when leg cramps are occurring.  Generalized anxiety and depression may also be contributing to GI issues.  Unclear if Wellbutrin  has helped, and this could potentially be changed to a different medication such as paroxetine. To treat the leg cramps and associated anxiety I will have her take an alprazolam  0.25 mg when the muscle cramps occur at night.

## 2023-07-25 NOTE — Progress Notes (Signed)
 Patient ID: Jessica Miles, female    DOB: 1945/05/01, 78 y.o.   MRN: [id number]  This visit was conducted in person.  BP 130/62   Pulse 79   Temp 98 F (36.7 C) (Temporal)   Ht 5' 6 (1.676 m)   Wt 103 lb 8 oz (46.9 kg)   SpO2 99%   BMI 16.71 kg/m    CC:  Chief Complaint  Patient presents with   Joint Pain   Cramping    Subjective:   HPI: Jessica Miles is a 78 y.o. female presenting on 07/25/2023 for Joint Pain and Cramping  She reports continued issues with   digestive issues ( weight loss), pudendal nerve pain, hip joint pain and IC. She has had new issue of leg cramping and leg spasms.   Lab work showed nml electrolytes including Mg,  nml vit D. Recent thyroid  testing showed low free T3... levo increased to 112 mcg daily.. plan recheck in 4-6 weeks.   Today she reports in last 3 weeks she has been feeling twitches.  Decreased dose of wellbutrin  to see if higher dose was causing.. no better.  Symptoms changed to feeling of bugs inside of legs.. creepy crawly feeling. Then now has progressed to severe cramping.Jessica Miles at night when legs still.  Progressed to cramps in ankles, toes and finger. Keeping her up at night. She has been using lidocaine  patches on ankles.  She tried mustard. Helped slightly.  She is drinking 64 oz a day.  Recently diarrhea restarted in last week.. restarted low starch diet and cholestipol. 7 BMs per  day.   She has been seeing a GI nutritionist.. started probiotic 3 weeks ago.   MIld low back pain, chronically. No numbness and weakness in legs.  Walking daily  .Jessica Miles But feeling weak overall. She has continued to lose weight.. not absorbing well. She is very hungry Has appt next week with  Dr. Wilhelmenia.       Relevant past medical, surgical, family and social history reviewed and updated as indicated. Interim medical history since our last visit reviewed. Allergies and medications reviewed and updated. Outpatient Medications Prior to Visit   Medication Sig Dispense Refill   alendronate  (FOSAMAX ) 70 MG tablet Take 1 tablet (70 mg total) by mouth every 7 (seven) days. Take with a full glass of water on an empty stomach. 4 tablet 11   atenolol (TENORMIN) 25 MG tablet Take 25 mg by mouth daily as needed (palpitations/pulse racing).     b complex vitamins capsule Take 1 capsule by mouth daily.     buPROPion  ER (WELLBUTRIN  SR) 100 MG 12 hr tablet Take 100 mg by mouth daily.     busPIRone  (BUSPAR ) 5 MG tablet Take 1 tablet (5 mg total) by mouth 2 (two) times daily as needed (anxiety). 180 tablet 0   Calcium  Carb-Cholecalciferol (CALCIUM  600 + D PO) Take 1 tablet by mouth in the morning. 600 mg /400 mcg     Cholecalciferol (VITAMIN D -3 PO) Take 5,000 Units by mouth in the morning.     colestipol  (COLESTID ) 1 g tablet Take 1 g by mouth daily.     estradiol  (ESTRACE ) 0.1 MG/GM vaginal cream 0.5 g of cream intravaginally administered daily at bedtime for 2 weeks, then reduce to twice weekly 42.5 g 12   ketoconazole  (NIZORAL ) 2 % cream Apply 1 Application topically 2 (two) times daily as needed for irritation.  Apply between the third and fourth toe twice a day as needed  levothyroxine  (SYNTHROID ) 112 MCG tablet Take 1 tablet (112 mcg total) by mouth daily. 15 tablet 0   Lifitegrast (XIIDRA) 5 % SOLN Place 1 drop into both eyes 2 (two) times daily.     Magnesium  250 MG TABS Take 1 tablet by mouth daily.     Methylcellulose, Laxative, (CITRUCEL) 500 MG TABS Take 6 tablets by mouth daily.     MULTIPLE VITAMIN PO Take 1 tablet by mouth in the morning.     mupirocin  cream (BACTROBAN ) 2 % Apply 1 Application topically 2 (two) times daily as needed.     nitroGLYCERIN  (NITRODUR - DOSED IN MG/24 HR) 0.2 mg/hr patch Place 0.2 mg onto the skin daily as needed.     Probiotic Product (PROBIOTIC PO) Take 1 tablet by mouth daily. Miome     triamcinolone  cream (KENALOG ) 0.5 % Apply 1 Application topically 2 (two) times daily as needed.     buPROPion   (WELLBUTRIN  XL) 150 MG 24 hr tablet Take 1 tablet (150 mg total) by mouth daily. (Patient taking differently: Take by mouth daily.) 90 tablet 1   colestipol  (COLESTID ) 1 g tablet Take 2 tablets (2 g total) by mouth 2 (two) times daily. (Patient taking differently: Take 1 g by mouth daily.) 360 tablet 3   MAGNESIUM  PO Take 125 mg by mouth daily.     Methylcellulose, Laxative, (CITRUCEL PO) Take 1 Dose by mouth 2 (two) times daily between meals as needed.     TYRVAYA 0.03 MG/ACT SOLN Place 1 spray into both nostrils daily as needed (dry eyes).     ALPHA LIPOIC ACID PO Take 100 mg by mouth daily. (Patient not taking: Reported on 07/25/2023)     ascorbic acid (VITAMIN C) 500 MG tablet Take 500 mg by mouth 2 (two) times daily.  (Patient not taking: Reported on 07/25/2023)     esomeprazole  (NEXIUM ) 40 MG capsule Take 40 mg by mouth daily as needed. (Patient not taking: Reported on 06/09/2023)     hydrocortisone -pramoxine (ANALPRAM-HC) 2.5-1 % rectal cream Place 1 Application rectally as needed for hemorrhoids or anal itching. (Patient not taking: Reported on 06/09/2023) 30 g 1   No facility-administered medications prior to visit.     Per HPI unless specifically indicated in ROS section below Review of Systems  Constitutional:  Positive for fatigue. Negative for fever.  HENT:  Negative for congestion.   Eyes:  Negative for pain.  Respiratory:  Negative for cough and shortness of breath.   Cardiovascular:  Negative for chest pain, palpitations and leg swelling.  Gastrointestinal:  Positive for abdominal pain and diarrhea.  Genitourinary:  Negative for dysuria and vaginal bleeding.  Musculoskeletal:  Negative for back pain.  Neurological:  Positive for weakness. Negative for syncope, light-headedness and headaches.  Psychiatric/Behavioral:  Positive for agitation and sleep disturbance. Negative for dysphoric mood. The patient is nervous/anxious.    Objective:  BP 130/62   Pulse 79   Temp 98 F (36.7  C) (Temporal)   Ht 5' 6 (1.676 m)   Wt 103 lb 8 oz (46.9 kg)   SpO2 99%   BMI 16.71 kg/m   Wt Readings from Last 3 Encounters:  07/25/23 103 lb 8 oz (46.9 kg)  06/09/23 108 lb 3.2 oz (49.1 kg)  06/07/23 108 lb (49 kg)      Physical Exam Constitutional:      General: She is not in acute distress.    Appearance: Normal appearance. She is well-developed. She is not ill-appearing or toxic-appearing.  HENT:     Head: Normocephalic.     Right Ear: Hearing, tympanic membrane, ear canal and external ear normal. Tympanic membrane is not erythematous, retracted or bulging.     Left Ear: Hearing, tympanic membrane, ear canal and external ear normal. Tympanic membrane is not erythematous, retracted or bulging.     Nose: No mucosal edema or rhinorrhea.     Right Sinus: No maxillary sinus tenderness or frontal sinus tenderness.     Left Sinus: No maxillary sinus tenderness or frontal sinus tenderness.     Mouth/Throat:     Pharynx: Uvula midline.  Eyes:     General: Lids are normal. Lids are everted, no foreign bodies appreciated.     Conjunctiva/sclera: Conjunctivae normal.     Pupils: Pupils are equal, round, and reactive to light.  Neck:     Thyroid : No thyroid  mass or thyromegaly.     Vascular: No carotid bruit.     Trachea: Trachea normal.  Cardiovascular:     Rate and Rhythm: Normal rate and regular rhythm.     Pulses: Normal pulses.     Heart sounds: Normal heart sounds, S1 normal and S2 normal. No murmur heard.    No friction rub. No gallop.  Pulmonary:     Effort: Pulmonary effort is normal. No tachypnea or respiratory distress.     Breath sounds: Normal breath sounds. No decreased breath sounds, wheezing, rhonchi or rales.  Abdominal:     General: Bowel sounds are normal.     Palpations: Abdomen is soft.     Tenderness: There is no abdominal tenderness.  Musculoskeletal:     Cervical back: Normal range of motion and neck supple.  Skin:    General: Skin is warm and dry.      Findings: No rash.  Neurological:     Mental Status: She is alert.  Psychiatric:        Attention and Perception: Attention normal.        Mood and Affect: Mood is anxious. Mood is not depressed. Affect is tearful.        Speech: Speech normal.        Behavior: Behavior is cooperative.        Thought Content: Thought content normal.        Cognition and Memory: Cognition normal.        Judgment: Judgment normal.       Results for orders placed or performed in visit on 07/20/23  T3, free   Collection Time: 07/20/23  9:36 AM  Result Value Ref Range   T3, Free 2.1 (L) 2.3 - 4.2 pg/mL  T4, free   Collection Time: 07/20/23  9:36 AM  Result Value Ref Range   Free T4 0.95 0.60 - 1.60 ng/dL  TSH   Collection Time: 07/20/23  9:36 AM  Result Value Ref Range   TSH 2.65 0.35 - 5.50 uIU/mL  Basic Metabolic Panel   Collection Time: 07/20/23  9:36 AM  Result Value Ref Range   Sodium 136 135 - 145 mEq/L   Potassium 4.1 3.5 - 5.1 mEq/L   Chloride 97 96 - 112 mEq/L   CO2 32 19 - 32 mEq/L   Glucose, Bld 94 70 - 99 mg/dL   BUN 25 (H) 6 - 23 mg/dL   Creatinine, Ser 8.81 0.40 - 1.20 mg/dL   GFR 56.05 (L) >39.99 mL/min   Calcium  9.6 8.4 - 10.5 mg/dL  Magnesium    Collection Time: 07/20/23  9:36 AM  Result Value Ref Range   Magnesium  2.0 1.5 - 2.5 mg/dL  VITAMIN D  25 Hydroxy (Vit-D Deficiency, Fractures)   Collection Time: 07/20/23  9:36 AM  Result Value Ref Range   VITD 70.29 30.00 - 100.00 ng/mL    Assessment and Plan  Muscle cramping Assessment & Plan: Acute, new issue for the patient. She is continuing to struggle with GI issues and does have diarrhea although flare of this does not seem to be associated with recent muscle cramping. Evaluation of thyroid  did reveal hypothyroidism so I have increased her dose of levothyroxine  212 mcg daily.  This may take more time to notice an effect if it is contributing to her the leg cramps. She appears well-hydrated and drinks a lot of  water. She is likely somewhat malnourished given possible decreased absorption of nutrients. No clear low back source. No clear medication side effect Will evaluate with additional labs for atypical causes of leg cramps.  She is very overwhelmed and anxious when leg cramps are occurring.  Generalized anxiety and depression may also be contributing to GI issues.  Unclear if Wellbutrin  has helped, and this could potentially be changed to a different medication such as paroxetine. To treat the leg cramps and associated anxiety I will have her take an alprazolam  0.25 mg when the muscle cramps occur at night.  Orders: -     Sedimentation rate -     CK -     Vitamin B12 -     Vitamin B6 -     CBC with Differential/Platelet -     Protein electrophoresis, serum  Other orders -     ALPRAZolam ; Take 1 tablet (0.25 mg total) by mouth at bedtime as needed (muscle spasms).  Dispense: 30 tablet; Refill: 0    No follow-ups on file.   Greig Ring, MD

## 2023-07-26 ENCOUNTER — Ambulatory Visit: Payer: Self-pay | Admitting: Family Medicine

## 2023-07-26 ENCOUNTER — Encounter: Payer: Self-pay | Admitting: Gastroenterology

## 2023-07-26 ENCOUNTER — Ambulatory Visit (INDEPENDENT_AMBULATORY_CARE_PROVIDER_SITE_OTHER): Admitting: Gastroenterology

## 2023-07-26 ENCOUNTER — Encounter: Payer: Self-pay | Admitting: Family Medicine

## 2023-07-26 ENCOUNTER — Other Ambulatory Visit (INDEPENDENT_AMBULATORY_CARE_PROVIDER_SITE_OTHER)

## 2023-07-26 ENCOUNTER — Ambulatory Visit: Admitting: Family Medicine

## 2023-07-26 VITALS — BP 120/60 | HR 84 | Ht 66.0 in | Wt 103.0 lb

## 2023-07-26 DIAGNOSIS — R1030 Lower abdominal pain, unspecified: Secondary | ICD-10-CM

## 2023-07-26 DIAGNOSIS — R194 Change in bowel habit: Secondary | ICD-10-CM

## 2023-07-26 DIAGNOSIS — R159 Full incontinence of feces: Secondary | ICD-10-CM | POA: Diagnosis not present

## 2023-07-26 DIAGNOSIS — Q453 Other congenital malformations of pancreas and pancreatic duct: Secondary | ICD-10-CM | POA: Diagnosis not present

## 2023-07-26 DIAGNOSIS — R634 Abnormal weight loss: Secondary | ICD-10-CM

## 2023-07-26 DIAGNOSIS — R748 Abnormal levels of other serum enzymes: Secondary | ICD-10-CM

## 2023-07-26 LAB — COMPREHENSIVE METABOLIC PANEL WITH GFR
ALT: 45 U/L — ABNORMAL HIGH (ref 0–35)
AST: 43 U/L — ABNORMAL HIGH (ref 0–37)
Albumin: 4.9 g/dL (ref 3.5–5.2)
Alkaline Phosphatase: 81 U/L (ref 39–117)
BUN: 26 mg/dL — ABNORMAL HIGH (ref 6–23)
CO2: 31 meq/L (ref 19–32)
Calcium: 9.5 mg/dL (ref 8.4–10.5)
Chloride: 101 meq/L (ref 96–112)
Creatinine, Ser: 0.93 mg/dL (ref 0.40–1.20)
GFR: 58.47 mL/min — ABNORMAL LOW (ref >60.00)
Glucose, Bld: 93 mg/dL (ref 70–99)
Potassium: 3.5 meq/L (ref 3.5–5.1)
Sodium: 139 meq/L (ref 135–145)
Total Bilirubin: 0.9 mg/dL (ref 0.2–1.2)
Total Protein: 7.4 g/dL (ref 6.0–8.3)

## 2023-07-26 LAB — HEPATIC FUNCTION PANEL
ALT: 45 U/L — ABNORMAL HIGH (ref 0–35)
AST: 43 U/L — ABNORMAL HIGH (ref 0–37)
Albumin: 4.9 g/dL (ref 3.5–5.2)
Alkaline Phosphatase: 81 U/L (ref 39–117)
Bilirubin, Direct: 0.1 mg/dL (ref 0.0–0.3)
Total Bilirubin: 0.9 mg/dL (ref 0.2–1.2)
Total Protein: 7.4 g/dL (ref 6.0–8.3)

## 2023-07-26 LAB — CK: Total CK: 324 U/L — ABNORMAL HIGH (ref 17–177)

## 2023-07-26 NOTE — Progress Notes (Signed)
 GASTROENTEROLOGY OUTPATIENT CLINIC VISIT   Primary Care Provider Avelina Greig BRAVO, MD [address] [place] [address] [phone]  Patient Profile: Jessica Miles is a 78 y.o. female with a pmh significant for Raynaud's disease, hypertension, prior COVID infection, anxiety, allergies, pancreatic duct dilation NOS, diverticulosis, SIBO.  The patient presents to the [facility] for an evaluation and management of problem(s) noted below:  Problem List 1. Abnormality of pancreatic duct   2. Change in bowel habits   3. Lower abdominal pain   4. Incontinence of feces, unspecified fecal incontinence type   5. Weight loss, unintentional   6. Elevated CK    Discussed the use of AI scribe software for clinical note transcription with the patient, who gave verbal consent to proceed.  History of Present Illness Please see prior GI notes for full details of HPI.  Interval History The patient presents for follow-up after recently having transitioned to [facility].  She has experienced a significant change in bowel habits over the past few months, with frequent bowel movements up to seven times a day, consisting of formed stools. Despite adhering to a combination of the FODMAP and specific carbohydrate diets, her weight has decreased to 99 pounds from a previous high of 107 pounds (though weight in our EMR shows relative stability over 2-years). Bowel movements occur shortly after eating, accompanied by abdominal pain reminiscent of previous diverticulitis episodes. She has been using colestipol  intermittently for diarrhea management, which she started two weeks ago.  She has consulted multiple specialists, including a dietitian and a therapist for PTSD, and has undergone various tests, including a pharmacological DNA test and food sensitivity test. She underwent a breath test for small intestine bacterial overgrowth, but her recent IBS smart breath test was negative per  report.  She reports more recently issues surrounding muscle weakness and tingling in her legs, which began two weeks ago. Initially attributed to an increased Wellbutrin  dosage, she reduced it back to 100 mg once daily, but symptoms persisted and worsened, with cramps in her ankles and hands. Her neurologist found elevated B6 levels and advised discontinuation of B vitamins, yet symptoms have not improved.  CK levels were found yesterday to be elevated.  Due to weakness and fatigue, she has been unable to participate in exercise classes or biking. She feels emotionally drained and weak overall, with a lack of energy and stamina.  She is working with a pelvic floor physical therapist and has undergone biofeedback for pelvic dyssynergia, identified in an anorectal manometry test.  She is not having fecal incontinence at this time thankfully as she has urge when her bowel movements are coming.       The patient does/does not take NSAIDs or BC/Goody Powder. Patient has/has not had an EGD. Patient has/has not had a Colonoscopy.  GI Review of Systems Positive as above Negative for  Pyrosis; Reflux; Regurgitation; Dysphagia; Odynophagia; Globus; Post-prandial cough; Nocturnal cough; Nasal regurgitation; Epigastric pain; Nausea; Vomiting; Hematemesis; Jaundice; Change in Appetite; Early satiety; Abdominal pain; Abdominal bloating; Eructation; Flatulence; Change in BM Frequency; Change in BM Consistency; Constipation; Diarrhea; Incontinence; Urgency; Tenesmus; Hematochezia; Melena  Review of Systems General: Denies fevers/chills/weight loss unintentionally Cardiovascular: Denies chest pain Pulmonary: Denies shortness of breath Gastroenterological: See HPI Genitourinary: Denies darkened urine Hematological: Denies easy bruising/bleeding Endocrine: Denies temperature intolerance Dermatological: Denies jaundice Psychological: Mood is stable  Medications Current Outpatient Medications  Medication Sig  Dispense Refill   alendronate  (FOSAMAX ) 70 MG tablet Take 1  tablet (70 mg total) by mouth every 7 (seven) days. Take with a full glass of water on an empty stomach. 4 tablet 11   ALPRAZolam  (XANAX ) 0.25 MG tablet Take 1 tablet (0.25 mg total) by mouth at bedtime as needed (muscle spasms). 30 tablet 0   atenolol (TENORMIN) 25 MG tablet Take 25 mg by mouth daily as needed (palpitations/pulse racing).     buPROPion  ER (WELLBUTRIN  SR) 100 MG 12 hr tablet Take 100 mg by mouth daily.     busPIRone  (BUSPAR ) 5 MG tablet Take 1 tablet (5 mg total) by mouth 2 (two) times daily as needed (anxiety). 180 tablet 0   Calcium  Carb-Cholecalciferol (CALCIUM  600 + D PO) Take 1 tablet by mouth in the morning. 600 mg /400 mcg     Cholecalciferol (VITAMIN D -3 PO) Take 5,000 Units by mouth in the morning.     colestipol  (COLESTID ) 1 g tablet Take 1 g by mouth daily.     estradiol  (ESTRACE ) 0.1 MG/GM vaginal cream 0.5 g of cream intravaginally administered daily at bedtime for 2 weeks, then reduce to twice weekly 42.5 g 12   ketoconazole  (NIZORAL ) 2 % cream Apply 1 Application topically 2 (two) times daily as needed for irritation.  Apply between the third and fourth toe twice a day as needed     levothyroxine  (SYNTHROID ) 112 MCG tablet Take 1 tablet (112 mcg total) by mouth daily. 15 tablet 0   Lifitegrast (XIIDRA) 5 % SOLN Place 1 drop into both eyes 2 (two) times daily.     Magnesium  250 MG TABS Take 1 tablet by mouth daily.     Methylcellulose, Laxative, (CITRUCEL) 500 MG TABS Take 6 tablets by mouth daily.     mupirocin  cream (BACTROBAN ) 2 % Apply 1 Application topically 2 (two) times daily as needed.     nitroGLYCERIN  (NITRODUR - DOSED IN MG/24 HR) 0.2 mg/hr patch Place 0.2 mg onto the skin daily as needed.     Probiotic Product (PROBIOTIC PO) Take 1 tablet by mouth daily. Miome     triamcinolone  cream (KENALOG ) 0.5 % Apply 1 Application topically 2 (two) times daily as needed.     b complex vitamins capsule Take 1  capsule by mouth daily.     MULTIPLE VITAMIN PO Take 1 tablet by mouth in the morning.     No current facility-administered medications for this visit.    Allergies Allergies  Allergen Reactions   Fluticasone Other (See Comments)    Nose bleeds   Mometasone Other (See Comments)    Epistaxis   Fexofenadine Palpitations    Histories Past Medical History:  Diagnosis Date   Allergy    Anxiety    COVID-19 virus infection    09-23-2019- 9-16 had monoclonal infusion    Depression    History of IBS    HPV (human papilloma virus) infection    Hyperlipidemia    82- not taking statin    Loss of taste    Neuropathy 2015   surgery for Mortons neuroma in 1985   Osteopenia    Osteoporosis    Raynaud's disease 02/2020   Tachycardia    Thyroid  disease    Past Surgical History:  Procedure Laterality Date   ANAL RECTAL MANOMETRY N/A 05/03/2023   Procedure: MANOMETRY, ANORECTAL;  Surgeon: Wilhelmenia Aloha Raddle., MD;  Location: [facility];  Service: Gastroenterology;  Laterality: N/A;   BIOPSY  01/25/2022   Procedure: BIOPSY;  Surgeon: Wilhelmenia Aloha Raddle., MD;  Location: [facility];  Service: Gastroenterology;;   COLONOSCOPY  2019   hems only    ESOPHAGOGASTRODUODENOSCOPY (EGD) WITH PROPOFOL  N/A 01/25/2022   Procedure: ESOPHAGOGASTRODUODENOSCOPY (EGD) WITH PROPOFOL ;  Surgeon: Wilhelmenia Aloha Raddle., MD;  Location: [facility];  Service: Gastroenterology;  Laterality: N/A;   EUS N/A 01/25/2022   Procedure: UPPER ENDOSCOPIC ULTRASOUND (EUS) LINEAR;  Surgeon: Wilhelmenia Aloha Raddle., MD;  Location: [facility];  Service: Gastroenterology;  Laterality: N/A;   FOOT NEUROMA SURGERY  1985   WISDOM TOOTH EXTRACTION     Social History   Socioeconomic History   Marital status: Married    Spouse name: Carlin B. Leabo   Number of children: 2   Years of education: MA   Highest education level: Master's degree (e.g., MA, MS, MEng, MEd, MSW, MBA)  Occupational History   Occupation:  Retired    Comment: Editor, commissioning  Tobacco Use   Smoking status: Never   Smokeless tobacco: Never  Vaping Use   Vaping status: Never Used  Substance and Sexual Activity   Alcohol use: Not Currently   Drug use: No   Sexual activity: Not Currently  Other Topics Concern   Not on file  Social History Narrative   12/23/20 Patient lives at [facility] with spouse.   Caffeine Use: 1 cup daily   2 kids- 4 grandchildren- two boys and two girls      Social Drivers of Corporate investment banker Strain: Low Risk  (02/17/2023)   Received from [facility]   Overall Financial Resource Strain (CARDIA)    Difficulty of Paying Living Expenses: Not hard at all  Food Insecurity: No Food Insecurity (07/24/2023)   Hunger Vital Sign    Worried About Running Out of Food in the Last Year: Never true    Ran Out of Food in the Last Year: Never true  Transportation Needs: No Transportation Needs (07/24/2023)   PRAPARE - Administrator, Civil Service (Medical): No    Lack of Transportation (Non-Medical): No  Physical Activity: Sufficiently Active (07/24/2023)   Exercise Vital Sign    Days of Exercise per Week: 7 days    Minutes of Exercise per Session: 60 min  Stress: Stress Concern Present (07/24/2023)   [employer] of Occupational Health - Occupational Stress Questionnaire    Feeling of Stress: Very much  Social Connections: Moderately Isolated (07/24/2023)   Social Connection and Isolation Panel    Frequency of Communication with Friends and Family: More than three times a week    Frequency of Social Gatherings with Friends and Family: Three times a week    Attends Religious Services: Patient declined    Active Member of Clubs or Organizations: No    Attends Banker Meetings: Not on file    Marital Status: Married  Intimate Partner Violence: Not At Risk (02/17/2023)   Received from [facility]   HITS    Over the last 12 months how often did your partner physically  hurt you?: Never    Over the last 12 months how often did your partner insult you or talk down to you?: Never    Over the last 12 months how often did your partner threaten you with physical harm?: Never    Over the last 12 months how often did your partner scream or curse at you?: Never   Family History  Problem Relation Age of Onset   Stroke Mother 88   Heart failure Mother    Heart attack Mother  Heart disease Mother    Heart attack Father 96   Heart disease Father    Heart disease Brother    Heart failure Brother        CHF   Stroke Paternal Uncle    Heart disease Maternal Grandmother    Heart attack Maternal Grandfather    Diabetes Son        IDDM   Thyroid  disease Neg Hx    Stomach cancer Neg Hx    Esophageal cancer Neg Hx    Colon polyps Neg Hx    Colon cancer Neg Hx    Rectal cancer Neg Hx    Inflammatory bowel disease Neg Hx    Liver disease Neg Hx    Pancreatic cancer Neg Hx    I have reviewed her medical, social, and family history in detail and updated the electronic medical record as necessary.    PHYSICAL EXAMINATION  BP 120/60   Pulse 84   Ht 5' 6 (1.676 m)   Wt 103 lb (46.7 kg)   BMI 16.62 kg/m  Wt Readings from Last 3 Encounters:  07/26/23 103 lb (46.7 kg)  07/25/23 103 lb 8 oz (46.9 kg)  06/09/23 108 lb 3.2 oz (49.1 kg)  GEN: NAD, appears stated age, doesn't appear chronically ill PSYCH: Cooperative, without pressured speech EYE: Conjunctivae pink, sclerae anicteric ENT: MMM CV: Nontachycardic RESP: No audible wheezing GI: NABS, soft, NT/ND, without rebound MSK/EXT: No significant lower extremity edema SKIN: No jaundice NEURO:  Alert & Oriented x 3, no focal deficits   REVIEW OF DATA  I reviewed the following data at the time of this encounter:  GI Procedures and Studies  April 2025 anorectal manometry   Laboratory Studies  Reviewed those in epic  Imaging Studies  1/25 abdominal ultrasound IMPRESSION: 1. Large RIGHT renal  cysts, largest measuring 6.8 centimeters in the LOWER pole region. As the kidney is ptotic, the LOWER pole cyst is potentially palpable, given its anterior location. 2. Otherwise, exam is normal.    ASSESSMENT  Ms. Mccormack is a 78 y.o. female with a pmh significant for Raynaud's disease, hypertension, prior COVID infection, anxiety, allergies, pancreatic duct dilation NOS, diverticulosis, SIBO.  The patient is seen today for evaluation and management of:  1. Abnormality of pancreatic duct   2. Change in bowel habits   3. Lower abdominal pain   4. Incontinence of feces, unspecified fecal incontinence type   5. Weight loss, unintentional   6. Elevated CK    The patient is hemodynamically stable.  Clinically, she has longstanding issues in regards to her changes in bowel habits her reported unintentional weight loss (though based on our reports, since 2023 she has had a relatively stable weight.  Thankfully her issues with previous fecal incontinence are improved. Many of her symptoms seem functional in origin at this time based on her previous evaluation notable GI providers.  She will continue on FODMAPs.  Will consider repeat SIBO breath testing in future.  If her bowel habit changes persist will need to consider repeat colonoscopy or sigmoidoscopy to evaluate for/collagenous colitis.  She will increase her colestipol  for period time to see if bile salt diarrhea could be playing a role see if that makes a difference for her symptoms.  We need to follow-up the previously noted pancreatic duct dilation with negative EUS 3, if no significant changes, then consider pushing out her surveillance.  May consider additional testing if there have been changes.  Further workup of her  elevated CK yesterday is reasonable we will see what it has done, since we are going to redraw labs today and hopefully it is not increasing where she may need to go to the hospital for IV hydration.  I will try to review Dr.   Leontine records at some point.  All patient questions were answered to the best of my ability, and the patient agrees to the aforementioned plan of action with follow-up as indicated.   PLAN  Laboratories as outlined below Order MRI/MRCP follow-up PD dilation Continue low FODMAPs Increase colestipol  to twice daily to see if that is helpful May need to consider colonoscopy/endoscopy for biopsies to rule out microscopic/collagenous colitis Likely consider stopping magnesium , if she is still taking that Continue to monitor weight loss otherwise May need updated CT chest/abdomen/pelvis in future    Orders Placed This Encounter  Procedures   MR ABDOMEN MRCP W WO CONTAST   Comp Met (CMET)   Hepatic function panel   Calcium , ionized   CK (Creatine Kinase)    New Prescriptions   No medications on file   Modified Medications   No medications on file    Planned Follow Up No follow-ups on file.   Total Time in Face-to-Face and in Coordination of Care for patient including independent/personal interpretation/review of prior testing, medical history, examination, medication adjustment, communicating results with the patient directly, and documentation within the EHR is 30 minutes.   Aloha Finner, MD [facility] Office # [phone]

## 2023-07-26 NOTE — Patient Instructions (Addendum)
 This is Dr. Lu website. He may be a possible resource in future. [email or web address]   Your provider has requested that you go to the basement level for lab work before leaving today. Press B on the elevator. The lab is located at the first door on the left as you exit the elevator.  You will be contacted by [facility] Scheduling in the next 2 days to arrange a MRI/MRCP.  The number on your caller ID will be [phone], please answer when they call.  If you have not heard from them in 2 days please call [phone] to schedule.    Increase your Colestipol  to twice daily for 4 weeks.   Due to recent changes in healthcare laws, you may see the results of your imaging and laboratory studies on MyChart before your provider has had a chance to review them.  We understand that in some cases there may be results that are confusing or concerning to you. Not all laboratory results come back in the same time frame and the provider may be waiting for multiple results in order to interpret others.  Please give us  48 hours in order for your provider to thoroughly review all the results before contacting the office for clarification of your results.   Follow-up in 3 months. Office will contact you to schedule.   _______________________________________________________  If your blood pressure at your visit was 140/90 or greater, please contact your primary care physician to follow up on this.  _______________________________________________________  If you are age 79 or older, your body mass index should be between 23-30. Your Body mass index is 16.62 kg/m. If this is out of the aforementioned range listed, please consider follow up with your Primary Care Provider.  If you are age 66 or younger, your body mass index should be between 19-25. Your Body mass index is 16.62 kg/m. If this is out of the aformentioned range listed, please consider follow up  with your Primary Care Provider.   ________________________________________________________  The [facility] providers would like to encourage you to use MYCHART to communicate with providers for non-urgent requests or questions.  Due to long hold times on the telephone, sending your provider a message by [facility] may be a faster and more efficient way to get a response.  Please allow 48 business hours for a response.  Please remember that this is for non-urgent requests.  _______________________________________________________  Thank you for choosing me and  Gastroenterology.  Dr. Wilhelmenia

## 2023-07-27 ENCOUNTER — Other Ambulatory Visit: Payer: Self-pay | Admitting: Family Medicine

## 2023-07-27 ENCOUNTER — Encounter: Payer: Self-pay | Admitting: Gastroenterology

## 2023-07-27 DIAGNOSIS — R252 Cramp and spasm: Secondary | ICD-10-CM

## 2023-07-27 DIAGNOSIS — R748 Abnormal levels of other serum enzymes: Secondary | ICD-10-CM

## 2023-07-27 DIAGNOSIS — M791 Myalgia, unspecified site: Secondary | ICD-10-CM

## 2023-07-27 LAB — CALCIUM, IONIZED: Calcium, Ion: 5.1 mg/dL (ref 4.7–5.5)

## 2023-07-28 ENCOUNTER — Ambulatory Visit
Admission: RE | Admit: 2023-07-28 | Discharge: 2023-07-28 | Disposition: A | Discharge status: Home / Self Care | Source: Ambulatory Visit | Attending: Gastroenterology | Admitting: Gastroenterology

## 2023-07-28 ENCOUNTER — Other Ambulatory Visit: Payer: Self-pay | Admitting: Gastroenterology

## 2023-07-28 ENCOUNTER — Other Ambulatory Visit: Payer: Self-pay

## 2023-07-28 ENCOUNTER — Ambulatory Visit: Payer: Self-pay | Admitting: Gastroenterology

## 2023-07-28 DIAGNOSIS — Q453 Other congenital malformations of pancreas and pancreatic duct: Secondary | ICD-10-CM

## 2023-07-28 DIAGNOSIS — R7989 Other specified abnormal findings of blood chemistry: Secondary | ICD-10-CM

## 2023-07-28 DIAGNOSIS — R1030 Lower abdominal pain, unspecified: Secondary | ICD-10-CM

## 2023-07-28 DIAGNOSIS — K859 Acute pancreatitis without necrosis or infection, unspecified: Secondary | ICD-10-CM

## 2023-07-28 DIAGNOSIS — R194 Change in bowel habit: Secondary | ICD-10-CM | POA: Diagnosis present

## 2023-07-28 DIAGNOSIS — D509 Iron deficiency anemia, unspecified: Secondary | ICD-10-CM

## 2023-07-28 MED ORDER — GADOBUTROL 1 MMOL/ML IV SOLN
4.0000 mL | Freq: Once | INTRAVENOUS | Status: AC | PRN
  Administered 2023-07-28: 4 mL via INTRAVENOUS

## 2023-07-28 NOTE — Progress Notes (Signed)
 Orders for labs entered. Order for U/S entered. Patient will be contacted by [facility] Scheduling to schedule ultrasound.

## 2023-07-29 ENCOUNTER — Encounter: Payer: Self-pay | Admitting: Family Medicine

## 2023-07-29 ENCOUNTER — Encounter: Payer: Self-pay | Admitting: Diagnostic Neuroimaging

## 2023-07-29 DIAGNOSIS — R200 Anesthesia of skin: Secondary | ICD-10-CM

## 2023-07-30 ENCOUNTER — Encounter: Payer: Self-pay | Admitting: Gastroenterology

## 2023-07-30 DIAGNOSIS — R634 Abnormal weight loss: Secondary | ICD-10-CM | POA: Insufficient documentation

## 2023-07-30 DIAGNOSIS — R159 Full incontinence of feces: Secondary | ICD-10-CM | POA: Insufficient documentation

## 2023-07-30 DIAGNOSIS — R748 Abnormal levels of other serum enzymes: Secondary | ICD-10-CM | POA: Insufficient documentation

## 2023-07-30 DIAGNOSIS — R1030 Lower abdominal pain, unspecified: Secondary | ICD-10-CM | POA: Insufficient documentation

## 2023-07-30 DIAGNOSIS — R194 Change in bowel habit: Secondary | ICD-10-CM | POA: Insufficient documentation

## 2023-07-30 DIAGNOSIS — Q453 Other congenital malformations of pancreas and pancreatic duct: Secondary | ICD-10-CM | POA: Insufficient documentation

## 2023-07-30 LAB — PROTEIN ELECTROPHORESIS, SERUM
Albumin ELP: 4.5 g/dL (ref 3.8–4.8)
Alpha 1: 0.3 g/dL (ref 0.2–0.3)
Alpha 2: 0.5 g/dL (ref 0.5–0.9)
Beta 2: 0.3 g/dL (ref 0.2–0.5)
Beta Globulin: 0.4 g/dL (ref 0.4–0.6)
Gamma Globulin: 0.8 g/dL (ref 0.8–1.7)
Total Protein: 6.8 g/dL (ref 6.1–8.1)

## 2023-07-30 LAB — VITAMIN B6: Vitamin B6: 16.7 ng/mL (ref 2.1–21.7)

## 2023-07-31 ENCOUNTER — Ambulatory Visit

## 2023-07-31 DIAGNOSIS — R279 Unspecified lack of coordination: Secondary | ICD-10-CM

## 2023-07-31 DIAGNOSIS — M5459 Other low back pain: Secondary | ICD-10-CM

## 2023-07-31 DIAGNOSIS — R102 Pelvic and perineal pain: Secondary | ICD-10-CM

## 2023-07-31 DIAGNOSIS — M62838 Other muscle spasm: Secondary | ICD-10-CM

## 2023-07-31 DIAGNOSIS — M25552 Pain in left hip: Secondary | ICD-10-CM

## 2023-07-31 DIAGNOSIS — M6281 Muscle weakness (generalized): Secondary | ICD-10-CM

## 2023-07-31 DIAGNOSIS — R293 Abnormal posture: Secondary | ICD-10-CM

## 2023-07-31 NOTE — Therapy (Signed)
 OUTPATIENT PHYSICAL THERAPY FEMALE PELVIC TREATMENT   Patient Name: Jessica Miles MRN: [id number] DOB:04-22-1945, 78 y.o., female Today's Date: 07/31/2023  END OF SESSION:  PT End of Session - 07/31/23 1212     Visit Number 30    Date for PT Re-Evaluation 10/23/23    Authorization Type Medicare - needs Kx at 15    Progress Note Due on Visit 40    PT Start Time 1230    PT Stop Time 1316    PT Time Calculation (min) 46 min    Activity Tolerance Patient tolerated treatment well    Behavior During Therapy [facility] for tasks assessed/performed            Past Medical History:  Diagnosis Date   Allergy    Anxiety    COVID-19 virus infection    09-23-2019- 9-16 had monoclonal infusion    Depression    History of IBS    HPV (human papilloma virus) infection    Hyperlipidemia    82- not taking statin    Loss of taste    Neuropathy 2015   surgery for Mortons neuroma in 1985   Osteopenia    Osteoporosis    Raynaud's disease 02/2020   Tachycardia    Thyroid  disease    Past Surgical History:  Procedure Laterality Date   ANAL RECTAL MANOMETRY N/A 05/03/2023   Procedure: CALA FERNS;  Surgeon: Wilhelmenia Aloha Raddle., MD;  Location: [facility];  Service: Gastroenterology;  Laterality: N/A;   BIOPSY  01/25/2022   Procedure: BIOPSY;  Surgeon: Wilhelmenia Aloha Raddle., MD;  Location: [facility];  Service: Gastroenterology;;   COLONOSCOPY  2019   hems only    ESOPHAGOGASTRODUODENOSCOPY (EGD) WITH PROPOFOL  N/A 01/25/2022   Procedure: ESOPHAGOGASTRODUODENOSCOPY (EGD) WITH PROPOFOL ;  Surgeon: Wilhelmenia Aloha Raddle., MD;  Location: [facility];  Service: Gastroenterology;  Laterality: N/A;   EUS N/A 01/25/2022   Procedure: UPPER ENDOSCOPIC ULTRASOUND (EUS) LINEAR;  Surgeon: Wilhelmenia Aloha Raddle., MD;  Location: [facility];  Service: Gastroenterology;  Laterality: N/A;   FOOT NEUROMA SURGERY  1985   WISDOM TOOTH EXTRACTION     Patient Active Problem List   Diagnosis Date  Noted   Weight loss, unintentional 07/30/2023   Lower abdominal pain 07/30/2023   Abnormality of pancreatic duct 07/30/2023   Change in bowel habits 07/30/2023   Elevated CK 07/30/2023   Incontinence of feces 07/30/2023   Muscle cramping 07/25/2023   Abnormal laboratory test result 06/09/2023   Cardiac arrhythmia 06/09/2023   Encounter for medication refill 06/09/2023   Finding of above normal blood pressure 06/09/2023   Iatrogenic thyrotoxicosis 06/09/2023   Menopausal symptom 06/09/2023   Muscle weakness of extremity 06/09/2023   Need for vaccination 06/09/2023   Neuroma of foot 06/09/2023   Nuclear senile cataract 06/09/2023   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   Disorder of skin 06/09/2023   Encounter for routine chest x-ray 06/09/2023   Labial irritation 06/07/2023   Vertigo 03/10/2023   Right upper quadrant abdominal mass 01/26/2023   Panic attacks 10/26/2022   Allergic reaction to insect bite 06/23/2022   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   Severely underweight adult 09/07/2021   Acute diarrhea 04/27/2021   Inflamed sebaceous cyst 02/11/2021   Transient disorientation 10/20/2020   Memory change 08/05/2020   Family history of early CAD 02/27/2020   Tremor 02/27/2020   Aortic atherosclerosis (HCC) 12/19/2019   Urinary urgency 05/21/2019   Bladder irritation 05/21/2019   External hemorrhoid 05/21/2019   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  Raynaud's syndrome 02/09/2019   Osteoarthritis of thumb, 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   Fatigue 12/19/2017   Acute left-sided low back pain without sciatica 12/19/2017   SVT (supraventricular tachycardia) (HCC) 08/31/2017   Peripheral neuropathy 08/31/2017   Elbow pain 10/21/2015   Dry eyes 09/21/2015   Atopic dermatitis 03/13/2015   IBS (irritable bowel syndrome) 03/18/2014   Osteoporosis 03/18/2014   Major depressive disorder, recurrent episode, moderate (HCC) 03/18/2014   Hypothyroidism 12/14/2013    PCP: Avelina Greig BRAVO,  MD  REFERRING PROVIDER: Darol Norris, MD  REFERRING DIAG: M79.18 (ICD-10-CM) - Myalgia, other site G58.8 (ICD-10-CM) - Other specified mononeuropathies  THERAPY DIAG:  Other muscle spasm  Muscle weakness (generalized)  Pain in left hip  Other low back pain  Pelvic pain  Unspecified lack of coordination  Abnormal posture  Rationale for Evaluation and Treatment: Rehabilitation  ONSET DATE: 3 years   SUBJECTIVE:                                                                                                                                                                                           SUBJECTIVE STATEMENT: Pt overall states that she feels awful and rates her level of discomfort at 7/10. She has not started taking estrogen cream again. She has made many different changes to her GI medicines and keeps going back and forth between laxatives and anti-diarrheal.   PAIN: 07/31/23 Are you having pain? Yes NPRS scale: 7/10 Pain location: Lt hip and perineum/vulva; lower abdomen discomfort  Pain type: soreness Pain description: intermittent   Aggravating factors: sitting Relieving factors: lying flat, sitting on toilet  PRECAUTIONS: Other: large mass in Rt lower quadrant that has not been treated by medical team  RED FLAGS: None   WEIGHT BEARING RESTRICTIONS: No  FALLS:  Has patient fallen in last 6 months? No  LIVING ENVIRONMENT: Lives with: lives with their family Lives in: House/apartment  OCCUPATION: retired Runner, broadcasting/film/video  PLOF: Independent  PATIENT GOALS: decrease pain  PERTINENT HISTORY:  Interstitial cystitis   Updated 04/13/23:  BOWEL MOVEMENT: Pain with bowel movement: No Type of bowel movement:Frequency up to 5x/hour anywhere from solid to diarrhea; she will go 1-2 hours after eating on average and Strain No Fully empty rectum: No Leakage: Yes: seed size stool can come out without sensation when walking   Pads: No Fiber supplement:  No  URINATION: Pain with urination: No Fully empty bladder: Yes: - Stream: Weak Urgency: No - but not drinking much and goes every time she walks by the bathroom  Frequency: as often as she can - very small amounts  Leakage: none Pads: No  INTERCOURSE: NA  PREGNANCY: Vaginal deliveries 2 Tearing No C-section deliveries 0 Currently pregnant No  PROLAPSE: None   OBJECTIVE:  Note: Objective measures were completed at Evaluation unless otherwise noted. 07/31/23 LOWER EXTREMITY MMT:  MMT Right eval Left eval  Hip abduction 5/5 5/5  Hip adduction 4+/5 4+/5  Hip internal rotation 5/5 5/5  Hip external rotation 5/5 4/5  Hip extension 4-/5 4-/5                  Internal Pelvic Floor significant superficial tenderness with report of burning, pinching and stinging; muscle spasm and aching in Lt deep pelvic floor muscle   Patient confirms identification and approves PT to assess internal pelvic floor and treatment Yes   PELVIC MMT:    MMT 07/31/23  Vaginal 3/5, 7 second hold, 4 repeat contractions  Rectal 2/5, 4 seconds  (Blank rows = not tested)        TONE: Very low tone overall; some vaginal stenosis that feels worse on the Lt side compared to Rt; Lt obturator muscle  PROLAPSE: Not tested   Single leg stance:  Rt: WNL  Lt: WNL  Palpation: tenderness in LT adductors/mediual hamstirngs with rigger points present   05/09/23: Grade 1 uterine ligament laxity Significant tenderness with palpation of cervix Significant improvement with paradoxical contraction - not present anymore with lift when squeezing and appropriate lengthening with bearing down    04/25/23: Grade 1 uterine ligament laxity Significant tenderness with palpation of cervix Paradoxical pelvic floor muscle contraction with attempting to bear down to look for anterior/posterior vaginal wall laxity (could be fear of releasing gas/urine)  04/13/23:               External Perineal Exam dryness,  tenderness, labial fusion bil                             Internal Pelvic Floor Pt has increased bil pelvic floor muscle tenderness and tightness; bladder restriction and reproduction of urgency with palpation; reproduction of lower abdominal pain with palpation of tight anterior Rt pelvic floor muscles  Patient confirms identification and approves PT to assess internal pelvic floor and treatment Yes   PELVIC MMT:    MMT 04/13/23  Vaginal 2/5, 5 second hold, 5 repeat contractions  (Blank rows = not tested)        TONE: High Bil  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, swelling of quadratus femoris muscle, tendinopathic and frayed hamstring origin Scheduled for anal manometry in April 2025   COGNITION: Overall cognitive status: Within functional limits for tasks assessed     SENSATION: Light touch: Appears intact  Proprioception: Appears intact   FUNCTIONAL/SPECIAL TESTS:  Single leg stance:  Rt: more stable, some Lt drop - compensated trendelenburg  Lt: less stable, more Rt drop - compensated trendelenburg Squat: Lt weight shift, Rt valgus knee collapse FAIR: (+) Lt hip Hamstring length: significant decrease with pain on Lt, -75 degrees Curl-up test: breath holding, but no distortion, 2 finger width diastasis in upper abdominals Bed mobility: breath holding  Palpable  transversus abdominus contraction bil, but weak Tenderness at Lt ischial tuberosity and hamstrings/Lt posterolateral hip mm  GAIT: Comments: WNL  POSTURE: forward head, decreased lumbar lordosis, posterior pelvic tilt, and Lt lower thoracic/upper lumbar curvature, elevated Lt iliac crest  PELVIC ALIGNMENT: Lt posterior rotation, Lt sacral facing rotation   LUMBARAROM/PROM:  A/PROM A/PROM  Eval (% available)  Flexion 75, pain on LT  Extension 75  Right lateral flexion 100  Left lateral flexion 100, pain in back and Lt pelvis  Right rotation 75  Left rotation 50, feels good on back   (Blank rows = not tested)   LOWER EXTREMITY MMT:  MMT Right eval Left eval  Hip abduction 5/5 5/5  Hip adduction    Hip internal rotation 3/5 4-/5  Hip external rotation 3/5 4/5   PALPATION:   General: Large mass located in Rt abdomen lateral to umbilicus, non-tender, but with pressure it caused discomfort in low back - patient had not noticed before and reports no doctor has mentioned to her before    TODAY'S TREATMENT:                                                                                                                              DATE:  07/31/23 RE-EVAL Manual: Pt provides verbal consent for internal vaginal/rectal pelvic floor exam. Internal vaginal and rectal exams for reassessment Neuromuscular re-education: Pelvic floor muscle contraction training vaginally and rectally with breath coordination  Rectal push training with exale and eccentrically lengthening pelvic floor muscles Therapeutic activities: Continued education and encouragement with being consistent with what has been recommended Revisited voiding schedule and strongly encouraged since she has not felt well enough to perform    07/25/23 Manual: Soft tissue mobilization to bil posterior LE with rhythmic rotation  Neuromuscular re-education: Vibration plate, relax setting 60 seconds standing with  diaphragmatic breathing  60 seconds x 2 standing with hip circles bil directions Seated 60 seconds with pelvic tilts and forward fold  Exercises: Supine waterfall pose (hips on yoga block with legs straight up in air) 4 minutes Butterfly 4 minutes Therapeutic activities: Pt education on getting in water to provide whole body compression and down training Performing some sort of fun activity to help improve outlook and provide distraction   07/19/23 Therapeutic activities: Diagram to help with pain neuroscience education involving multiple diagnosis and conditions Review: Urge drill Voiding schedule to help with IC Mindfulness body scan Abdominal massage Being consistent with medications that MD has prescribed, such as estrogen cream and miralax  PATIENT EDUCATION:  Education details: See above Person educated: Patient Education method: Explanation, Demonstration, Tactile cues, Verbal cues, and Handouts Education comprehension: verbalized understanding  HOME EXERCISE PROGRAM: [email or web address]  ASSESSMENT:  CLINICAL IMPRESSION: Pt has had difficult time with dysfunction of pudendal neuralgia, GI system, and Lt hip/low back pain. She feeling very discouraged at this point and reports feeling awful. She is also struggling with cramping in her LE and hands, which she is now taking valium at night for which is helpful. Vaginal assessment of pelvic floor muscles today demonstrated good improvements in strength, endurance, and coordination; she demonstrates only small amount of increased muscle spasm in Lt obturator internus that could be related to hip pain. She has some vaginal stenosis and significant stinging with palpation, Rt>Lt. After performing this exam and also not finding much of an increase in pelvic floor muscle tension rectally, believe that increased tone is not the cause of her pain. She continues to have large trigger points in adductors and medial hamstrings that may be causing her  Lt hip pain and contributing to pudendal nerve irritation/pain with sitting. Rectally her coordination of pelvic floor muscle contraction, concentrically and eccentrically, has improved greatly and she is able to generate appropriate intra-rectal pressure with lengthening her pelvic floor muscles as she pretends to push. Her hip strength has improved overall with some decreases on the Rt as she has not focused any of her exercises here; most notable hip strength deficit is equal weakness in bil glutes. Believe she may still benefit form skilled PT intervention in order to continue helping with pain management, strengthen areas of weakness, try to continue consistency of exercise and intervention, perform pain science education and down training, and improve quality of life.  OBJECTIVE IMPAIRMENTS: decreased activity tolerance, decreased coordination, decreased endurance, decreased mobility, decreased strength, increased fascial restrictions, increased muscle spasms, impaired tone, postural dysfunction, and pain.   ACTIVITY LIMITATIONS: lifting, bending, sitting, squatting, and bed mobility  PARTICIPATION LIMITATIONS: cleaning, driving, and community activity  PERSONAL FACTORS: 1 comorbidity: medical history are also affecting patient's functional outcome.   REHAB POTENTIAL: Good  CLINICAL DECISION MAKING: Evolving/moderate complexity  EVALUATION COMPLEXITY: Moderate   GOALS: Goals reviewed with patient? Yes  SHORT TERM GOALS: Updated 07/31/23  Pt will be independent with HEP.   Baseline: Goal status: MET 04/13/23  2.  Pt will be able to teach back and utilize urge suppression technique in order to help reduce number of trips to the bathroom.    Baseline:  Goal status: MET 05/09/23  3.  Pt will begin use of pelvic floor muscle wand on regular basis (2-3x/week) in order to help reduce pelvic floor muscle tension and pain. Baseline: has not purchased at of now Goal status: IN PROGRESS  07/31/23  4.  Pt will be independent with diaphragmatic breathing and down training activities in order to improve pelvic floor relaxation.  Baseline:  Goal status: MET 05/09/23  5.  Pt will report pelvic pain no greater than 4/10. Baseline:  Goal status: MET 05/09/23   LONG TERM GOALS: Updated 07/31/23  Pt will be independent with advanced HEP.   Baseline:  Goal status: IN PROGRESS 07/31/23  2.  Pt will be able to go 2-3 hours in between voids without urgency or incontinence in order to improve QOL and perform all functional activities with less difficulty.   Baseline: not currently keeping track - she states that she feels too miserable to work on recommending voiding schedule Goal status: IN PROGRESS 07/31/23  3.  Pt will report pelvic pain no greater than 2/10.  Baseline: still getting up to 4-5/10 Goal status: IN PROGRESS 07/31/23  4.  Pt will be able to sit for >30 minutes without any increased pelvic pain.  Baseline: She has pain only up to 1/10 if she is sitting with her specialized pillow - but has to use pillow Goal status: IN PROGRESS 07/31/23  5.  Pt will be able to stand on single leg without pelvic drop and squat without weight shift in order to demonstrate improved functional core and hip strength to more appropriately stabilize pelvis.  Baseline:  Goal status: MET 07/31/23  6.  Pt will demonstrate increase in all impaired hip strength by 1 muscle grades in order to demonstrate improved lumbopelvic support and increase functional ability.   Baseline: many improved; bil extension 4-/5 Goal status: IN PROGRESS 07/31/23  PLAN:  PT FREQUENCY: 1-2x/week  PT DURATION: 12 weeks  PLANNED INTERVENTIONS: 97110-Therapeutic exercises, 97530- Therapeutic activity, 97112- Neuromuscular re-education, 97535- Self Care, [id number]- Manual therapy, Dry Needling, and Biofeedback  PLAN FOR NEXT SESSION: Plan to work on breathing techniques and manual techniques to abdomen; dry needling  to Lt adductors; continue pelvic floor muscle release; adductor stretches; pudendal nerve glides    Josette Mares, PT, DPT07/21/251:19 PM

## 2023-08-01 DIAGNOSIS — R200 Anesthesia of skin: Secondary | ICD-10-CM | POA: Insufficient documentation

## 2023-08-01 MED ORDER — ALPRAZOLAM 1 MG PO TABS: 1.0000 mg | ORAL_TABLET | Freq: Every evening | ORAL | 0 refills | Status: DC | PRN

## 2023-08-01 NOTE — Telephone Encounter (Signed)
 Orders Placed This Encounter  Procedures   NCV with EMG(electromyography)     Please notify patient EMG schedule

## 2023-08-02 ENCOUNTER — Telehealth: Payer: Self-pay | Admitting: *Deleted

## 2023-08-02 ENCOUNTER — Encounter: Payer: Self-pay | Admitting: Gastroenterology

## 2023-08-02 DIAGNOSIS — E7849 Other hyperlipidemia: Secondary | ICD-10-CM

## 2023-08-02 NOTE — Telephone Encounter (Signed)
-----   Message from Veva JINNY Ferrari sent at 08/02/2023 11:11 AM EDT ----- Regarding: Lab orders for [facility]. 7.25.25 Patient is scheduled for CPX labs, please order future labs, Thanks , Veva

## 2023-08-04 ENCOUNTER — Encounter: Payer: Self-pay | Admitting: Gastroenterology

## 2023-08-04 ENCOUNTER — Other Ambulatory Visit

## 2023-08-04 NOTE — Progress Notes (Signed)
 Documentation of outside records  10/23 [facility] smart breath test Gases were detected at normal level so no evidence of SIBO  11/23 [facility] smart laboratories These will be scanned into the chart  [facility] medication testing These will be scanned into the chart   Aloha Finner, MD [facility] Office # [phone]

## 2023-08-07 ENCOUNTER — Other Ambulatory Visit (INDEPENDENT_AMBULATORY_CARE_PROVIDER_SITE_OTHER)

## 2023-08-07 DIAGNOSIS — Q453 Other congenital malformations of pancreas and pancreatic duct: Secondary | ICD-10-CM

## 2023-08-07 DIAGNOSIS — D509 Iron deficiency anemia, unspecified: Secondary | ICD-10-CM | POA: Diagnosis not present

## 2023-08-07 DIAGNOSIS — R7989 Other specified abnormal findings of blood chemistry: Secondary | ICD-10-CM

## 2023-08-07 DIAGNOSIS — E7849 Other hyperlipidemia: Secondary | ICD-10-CM

## 2023-08-07 LAB — COMPREHENSIVE METABOLIC PANEL WITH GFR
ALT: 50 U/L — ABNORMAL HIGH (ref 0–35)
AST: 44 U/L — ABNORMAL HIGH (ref 0–37)
Albumin: 4.8 g/dL (ref 3.5–5.2)
Alkaline Phosphatase: 72 U/L (ref 39–117)
BUN: 23 mg/dL (ref 6–23)
CO2: 28 meq/L (ref 19–32)
Calcium: 9.8 mg/dL (ref 8.4–10.5)
Chloride: 95 meq/L — ABNORMAL LOW (ref 96–112)
Creatinine, Ser: 0.99 mg/dL (ref 0.40–1.20)
GFR: 54.23 mL/min — ABNORMAL LOW (ref >60.00)
Glucose, Bld: 107 mg/dL — ABNORMAL HIGH (ref 70–99)
Potassium: 4.3 meq/L (ref 3.5–5.1)
Sodium: 132 meq/L — ABNORMAL LOW (ref 135–145)
Total Bilirubin: 0.8 mg/dL (ref 0.2–1.2)
Total Protein: 6.9 g/dL (ref 6.0–8.3)

## 2023-08-07 LAB — LIPID PANEL
Cholesterol: 231 mg/dL — ABNORMAL HIGH (ref 0–200)
HDL: 101.4 mg/dL (ref >39.00)
LDL Cholesterol: 119 mg/dL — ABNORMAL HIGH (ref 0–99)
NonHDL: 130.08
Total CHOL/HDL Ratio: 2
Triglycerides: 55 mg/dL (ref 0.0–149.0)
VLDL: 11 mg/dL (ref 0.0–40.0)

## 2023-08-07 LAB — CK: Total CK: 153 U/L (ref 17–177)

## 2023-08-07 LAB — PROTIME-INR
INR: 1 ratio (ref 0.8–1.0)
Prothrombin Time: 10.6 s (ref 9.6–13.1)

## 2023-08-07 NOTE — Addendum Note (Signed)
 Addended by: HOPE VEVA PARAS on: 08/07/2023 07:38 AM   Modules accepted: Orders

## 2023-08-08 ENCOUNTER — Ambulatory Visit

## 2023-08-08 ENCOUNTER — Ambulatory Visit: Payer: Self-pay | Admitting: Family Medicine

## 2023-08-08 ENCOUNTER — Encounter: Payer: Self-pay | Admitting: Diagnostic Neuroimaging

## 2023-08-08 ENCOUNTER — Ambulatory Visit: Payer: Self-pay | Admitting: Gastroenterology

## 2023-08-08 ENCOUNTER — Encounter: Payer: Self-pay | Admitting: Gastroenterology

## 2023-08-08 ENCOUNTER — Telehealth: Payer: Self-pay | Admitting: Diagnostic Neuroimaging

## 2023-08-08 DIAGNOSIS — R293 Abnormal posture: Secondary | ICD-10-CM

## 2023-08-08 DIAGNOSIS — M62838 Other muscle spasm: Secondary | ICD-10-CM

## 2023-08-08 DIAGNOSIS — M25552 Pain in left hip: Secondary | ICD-10-CM

## 2023-08-08 DIAGNOSIS — M5459 Other low back pain: Secondary | ICD-10-CM

## 2023-08-08 DIAGNOSIS — R279 Unspecified lack of coordination: Secondary | ICD-10-CM

## 2023-08-08 DIAGNOSIS — R102 Pelvic and perineal pain: Secondary | ICD-10-CM

## 2023-08-08 DIAGNOSIS — M6281 Muscle weakness (generalized): Secondary | ICD-10-CM

## 2023-08-08 LAB — HEPATITIS B SURFACE ANTIGEN: Hepatitis B Surface Ag: NONREACTIVE

## 2023-08-08 LAB — HEPATITIS C ANTIBODY: Hepatitis C Ab: NONREACTIVE

## 2023-08-08 LAB — HEPATITIS B SURFACE ANTIBODY,QUALITATIVE: Hep B S Ab: NONREACTIVE

## 2023-08-08 LAB — FERRITIN: Ferritin: 139.1 ng/mL (ref 10.0–291.0)

## 2023-08-08 LAB — HEPATITIS A ANTIBODY, IGM: Hep A IgM: NONREACTIVE

## 2023-08-08 LAB — HEPATITIS A ANTIBODY, TOTAL: Hepatitis A AB,Total: NONREACTIVE

## 2023-08-08 LAB — HEPATITIS B CORE ANTIBODY, TOTAL: Hep B Core Total Ab: NONREACTIVE

## 2023-08-08 NOTE — Telephone Encounter (Signed)
 Patient said Last 4 weeks symptoms have gotten worse. Waves of cold comes cover over my body, feels like it burns and tingling in both legs. Would like a call back to discuss getting an appointment this year

## 2023-08-08 NOTE — Telephone Encounter (Signed)
 Patty, Once we see any other referral that I need to sign in the setting of pelvic floor biofeedback in the setting of her previous anorectal manometry findings, I am happy to do that.  Dr. Zachary Spirotos is a former colleague of mine. He works with patients to deal with issues of neuro gastroenterology and functional GI, similar to Dr. Leontine in the past. This is his information: [email or web address]  Thanks. GM

## 2023-08-08 NOTE — Therapy (Signed)
 OUTPATIENT PHYSICAL THERAPY FEMALE PELVIC TREATMENT   Patient Name: Jessica Miles MRN: [id number] DOB:[birth year], 78 y.o., female Today's Date: 08/08/2023  END OF SESSION:  PT End of Session - 08/08/23 1022     Visit Number 31    Date for PT Re-Evaluation 10/23/23    Authorization Type Medicare - needs Kx at 15    Progress Note Due on Visit 40    PT Start Time 1018    PT Stop Time 1052    PT Time Calculation (min) 34 min    Activity Tolerance Patient tolerated treatment well    Behavior During Therapy [facility] for tasks assessed/performed            Past Medical History:  Diagnosis Date   Allergy    Anxiety    COVID-19 virus infection    09-23-2019- 9-16 had monoclonal infusion    Depression    History of IBS    HPV (human papilloma virus) infection    Hyperlipidemia    82- not taking statin    Loss of taste    Neuropathy 2015   surgery for Mortons neuroma in 1985   Osteopenia    Osteoporosis    Raynaud's disease 02/2020   Tachycardia    Thyroid  disease    Past Surgical History:  Procedure Laterality Date   ANAL RECTAL MANOMETRY N/A 05/03/2023   Procedure: CALA FERNS;  Surgeon: Wilhelmenia Aloha Raddle., MD;  Location: [facility];  Service: Gastroenterology;  Laterality: N/A;   BIOPSY  01/25/2022   Procedure: BIOPSY;  Surgeon: Wilhelmenia Aloha Raddle., MD;  Location: [facility];  Service: Gastroenterology;;   COLONOSCOPY  2019   hems only    ESOPHAGOGASTRODUODENOSCOPY (EGD) WITH PROPOFOL  N/A 01/25/2022   Procedure: ESOPHAGOGASTRODUODENOSCOPY (EGD) WITH PROPOFOL ;  Surgeon: Wilhelmenia Aloha Raddle., MD;  Location: [facility];  Service: Gastroenterology;  Laterality: N/A;   EUS N/A 01/25/2022   Procedure: UPPER ENDOSCOPIC ULTRASOUND (EUS) LINEAR;  Surgeon: Wilhelmenia Aloha Raddle., MD;  Location: [facility];  Service: Gastroenterology;  Laterality: N/A;   FOOT NEUROMA SURGERY  1985   WISDOM TOOTH EXTRACTION     Patient Active Problem List   Diagnosis Date  Noted   Numbness and tingling 08/01/2023   Weight loss, unintentional 07/30/2023   Lower abdominal pain 07/30/2023   Abnormality of pancreatic duct 07/30/2023   Change in bowel habits 07/30/2023   Elevated CK 07/30/2023   Incontinence of feces 07/30/2023   Muscle cramping 07/25/2023   Abnormal laboratory test result 06/09/2023   Cardiac arrhythmia 06/09/2023   Encounter for medication refill 06/09/2023   Finding of above normal blood pressure 06/09/2023   Iatrogenic thyrotoxicosis 06/09/2023   Menopausal symptom 06/09/2023   Muscle weakness of extremity 06/09/2023   Need for vaccination 06/09/2023   Neuroma of foot 06/09/2023   Nuclear senile cataract 06/09/2023   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   Disorder of skin 06/09/2023   Encounter for routine chest x-ray 06/09/2023   Labial irritation 06/07/2023   Vertigo 03/10/2023   Right upper quadrant abdominal mass 01/26/2023   Panic attacks 10/26/2022   Allergic reaction to insect bite 06/23/2022   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   Severely underweight adult 09/07/2021   Acute diarrhea 04/27/2021   Inflamed sebaceous cyst 02/11/2021   Transient disorientation 10/20/2020   Memory change 08/05/2020   Family history of early CAD 02/27/2020   Tremor 02/27/2020   Aortic atherosclerosis (HCC) 12/19/2019   Urinary urgency 05/21/2019   Bladder irritation 05/21/2019   External hemorrhoid  05/21/2019   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   Raynaud's syndrome 02/09/2019   Osteoarthritis of thumb, 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   Fatigue 12/19/2017   Acute left-sided low back pain without sciatica 12/19/2017   SVT (supraventricular tachycardia) (HCC) 08/31/2017   Peripheral neuropathy 08/31/2017   Elbow pain 10/21/2015   Dry eyes 09/21/2015   Atopic dermatitis 03/13/2015   IBS (irritable bowel syndrome) 03/18/2014   Osteoporosis 03/18/2014   Major depressive disorder, recurrent episode, moderate (HCC) 03/18/2014   Hypothyroidism 12/14/2013     PCP: Avelina Greig BRAVO, MD  REFERRING PROVIDER: Darol Norris, MD  REFERRING DIAG: M79.18 (ICD-10-CM) - Myalgia, other site G58.8 (ICD-10-CM) - Other specified mononeuropathies  THERAPY DIAG:  Other muscle spasm  Muscle weakness (generalized)  Pain in left hip  Other low back pain  Pelvic pain  Unspecified lack of coordination  Abnormal posture  Rationale for Evaluation and Treatment: Rehabilitation  ONSET DATE: 3 years   SUBJECTIVE:                                                                                                                                                                                           SUBJECTIVE STATEMENT: Pt states that bladder instillation was last week. Pt states that it was a bad experience. She has continued to get horrible cramping it it happened during the procedure and the MD was able to see. She does feel like the bladder instillation has reduced the overall sensation of IC, with less severe pain and able to hold more urine. 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   PAIN: 07/31/23 Are you having pain? Yes NPRS scale: 7/10 Pain location: Lt hip and perineum/vulva; lower abdomen discomfort  Pain type: soreness Pain description: intermittent   Aggravating factors: sitting Relieving factors: lying flat, sitting on toilet  PRECAUTIONS: Other: large mass in Rt lower quadrant that has not been treated by medical team  RED FLAGS: None   WEIGHT BEARING RESTRICTIONS: No  FALLS:  Has patient fallen in last 6 months? No  LIVING ENVIRONMENT: Lives with: lives with their family Lives in: House/apartment  OCCUPATION: retired Runner, broadcasting/film/video  PLOF: Independent  PATIENT GOALS: decrease pain  PERTINENT HISTORY:  Interstitial cystitis   Updated 04/13/23:  BOWEL MOVEMENT: Pain with bowel movement: No Type of bowel movement:Frequency up to 5x/hour anywhere from solid to diarrhea; she will go  1-2 hours after eating on average and Strain No Fully empty rectum: No Leakage: Yes: seed size stool can come out without sensation when walking   Pads: No Fiber supplement: No  URINATION: Pain with urination: No Fully empty bladder: Yes: -  Stream: Weak Urgency: No - but not drinking much and goes every time she walks by the bathroom  Frequency: as often as she can - very small amounts  Leakage: none Pads: No  INTERCOURSE: NA  PREGNANCY: Vaginal deliveries 2 Tearing No C-section deliveries 0 Currently pregnant No  PROLAPSE: None   OBJECTIVE:  Note: Objective measures were completed at Evaluation unless otherwise noted. 07/31/23 LOWER EXTREMITY MMT:  MMT Right eval Left eval  Hip abduction 5/5 5/5  Hip adduction 4+/5 4+/5  Hip internal rotation 5/5 5/5  Hip external rotation 5/5 4/5  Hip extension 4-/5 4-/5                  Internal Pelvic Floor significant superficial tenderness with report of burning, pinching and stinging; muscle spasm and aching in Lt deep pelvic floor muscle   Patient confirms identification and approves PT to assess internal pelvic floor and treatment Yes   PELVIC MMT:    MMT 07/31/23  Vaginal 3/5, 7 second hold, 4 repeat contractions  Rectal 2/5, 4 seconds  (Blank rows = not tested)        TONE: Very low tone overall; some vaginal stenosis that feels worse on the Lt side compared to Rt; Lt obturator muscle  PROLAPSE: Not tested   Single leg stance:  Rt: WNL  Lt: WNL  Palpation: tenderness in LT adductors/mediual hamstirngs with rigger points present   05/09/23: Grade 1 uterine ligament laxity Significant tenderness with palpation of cervix Significant improvement with paradoxical contraction - not present anymore with lift when squeezing and appropriate lengthening with bearing down    04/25/23: Grade 1 uterine ligament laxity Significant tenderness with palpation of cervix Paradoxical pelvic floor muscle contraction  with attempting to bear down to look for anterior/posterior vaginal wall laxity (could be fear of releasing gas/urine)  04/13/23:               External Perineal Exam dryness, tenderness, labial fusion bil                             Internal Pelvic Floor Pt has increased bil pelvic floor muscle tenderness and tightness; bladder restriction and reproduction of urgency with palpation; reproduction of lower abdominal pain with palpation of tight anterior Rt pelvic floor muscles  Patient confirms identification and approves PT to assess internal pelvic floor and treatment Yes   PELVIC MMT:    MMT 04/13/23  Vaginal 2/5, 5 second hold, 5 repeat contractions  (Blank rows = not tested)        TONE: High Bil  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,  swelling of quadratus femoris muscle, tendinopathic and frayed hamstring origin Scheduled for anal manometry in April 2025   COGNITION: Overall cognitive status: Within functional limits for tasks assessed     SENSATION: Light touch: Appears intact Proprioception: Appears intact   FUNCTIONAL/SPECIAL TESTS:  Single leg stance:  Rt: more stable, some Lt drop - compensated trendelenburg  Lt: less stable, more Rt drop - compensated trendelenburg Squat: Lt weight shift, Rt valgus knee collapse FAIR: (+) Lt hip Hamstring length:  significant decrease with pain on Lt, -75 degrees Curl-up test: breath holding, but no distortion, 2 finger width diastasis in upper abdominals Bed mobility: breath holding  Palpable transversus abdominus contraction bil, but weak Tenderness at Lt ischial tuberosity and hamstrings/Lt posterolateral hip mm  GAIT: Comments: WNL  POSTURE: forward head, decreased lumbar lordosis, posterior pelvic tilt, and Lt lower thoracic/upper lumbar curvature, elevated Lt iliac crest  PELVIC ALIGNMENT: Lt posterior rotation, Lt sacral facing rotation   LUMBARAROM/PROM:  A/PROM A/PROM  Eval (% available)  Flexion 75, pain on LT  Extension 75  Right lateral flexion 100  Left lateral flexion 100, pain in back and Lt pelvis  Right rotation 75  Left rotation 50, feels good on back   (Blank rows = not tested)   LOWER EXTREMITY MMT:  MMT Right eval Left eval  Hip abduction 5/5 5/5  Hip adduction    Hip internal rotation 3/5 4-/5  Hip external rotation 3/5 4/5   PALPATION:   General: Large mass located in Rt abdomen lateral to umbilicus, non-tender, but with pressure it caused discomfort in low back - patient had not noticed before and reports no doctor has mentioned to her before    TODAY'S TREATMENT:                                                                                                                              DATE:  08/08/23 Therapeutic activities: Reviewed different pain and what the pudendal nerve block should have done in order to help reassure her that she is seeing some progress We reviewed the same pain concepts and anatomy for bladder instillation to reassure her that it appears that procedure was helpful We discussed POC and what PT can offer her at this point - she is going to think about benefit from sessions and consider discharging for now but does not want to cancel appointments at this time until she sees MD tomorrow Reviewed importance of down training and  relaxation techniques   07/31/23 RE-EVAL Manual: Pt provides verbal consent for internal vaginal/rectal pelvic floor exam. Internal vaginal and rectal exams for reassessment Neuromuscular re-education: Pelvic floor muscle contraction training vaginally and rectally with breath coordination  Rectal push training with exale and eccentrically lengthening pelvic floor muscles Therapeutic activities: Continued education and encouragement with being consistent with what has been recommended Revisited voiding schedule and strongly encouraged since she has not felt well enough to perform    07/25/23  Manual: Soft tissue mobilization to bil posterior LE with rhythmic rotation  Neuromuscular re-education: Vibration plate, relax setting 60 seconds standing with diaphragmatic breathing  60 seconds x 2 standing with hip circles bil directions Seated 60 seconds with pelvic tilts and forward fold  Exercises: Supine waterfall pose (hips on yoga block with legs straight up in air) 4 minutes Butterfly 4 minutes Therapeutic activities: Pt education on getting in water to provide whole body compression and down training Performing some sort of fun activity to help improve outlook and provide distraction  PATIENT EDUCATION:  Education details: See above Person educated: Patient Education method: Explanation, Demonstration, Tactile cues, Verbal cues, and Handouts Education comprehension: verbalized understanding  HOME EXERCISE PROGRAM: [email or web address]  ASSESSMENT:  CLINICAL IMPRESSION: It sounds like patient did have good benefit from bladder instillation and pudendal nerve block. We went over history of pain, anatomy, and the improvements in her pain at this time to help reassure her that there have been improvements. She reports difficulty feeling overwhelmed with medical care overall right now and is not sure where to go for help. We discussed at length if PT is the right place for her right now due  to lack of benefit and adding to stress with more appointments. She was encouraged to consider this over the next week and let us  know if she would like to continue or not. Believe she may still benefit form skilled PT intervention in order to continue helping with pain management, strengthen areas of weakness, try to continue consistency of exercise and intervention, perform pain science education and down training, and improve quality of life.  OBJECTIVE IMPAIRMENTS: decreased activity tolerance, decreased coordination, decreased endurance, decreased mobility, decreased strength, increased fascial restrictions, increased muscle spasms, impaired tone, postural dysfunction, and pain.   ACTIVITY LIMITATIONS: lifting, bending, sitting, squatting, and bed mobility  PARTICIPATION LIMITATIONS: cleaning, driving, and community activity  PERSONAL FACTORS: 1 comorbidity: medical history are also affecting patient's functional outcome.   REHAB POTENTIAL: Good  CLINICAL DECISION MAKING: Evolving/moderate complexity  EVALUATION COMPLEXITY: Moderate   GOALS: Goals reviewed with patient? Yes  SHORT TERM GOALS: Updated 07/31/23  Pt will be independent with HEP.   Baseline: Goal status: MET 04/13/23  2.  Pt will be able to teach back and utilize urge suppression technique in order to help reduce number of trips to the bathroom.    Baseline:  Goal status: MET 05/09/23  3.  Pt will begin use of pelvic floor muscle wand on regular basis (2-3x/week) in order to help reduce pelvic floor muscle tension and pain. Baseline: has not purchased at of now Goal status: IN PROGRESS 07/31/23  4.  Pt will be independent with diaphragmatic breathing and down training activities in order to improve pelvic floor relaxation.  Baseline:  Goal status: MET 05/09/23  5.  Pt will report pelvic pain no greater than 4/10. Baseline:  Goal status: MET 05/09/23   LONG TERM GOALS: Updated 07/31/23  Pt will be independent  with advanced HEP.   Baseline:  Goal status: IN PROGRESS 07/31/23  2.  Pt will be able to go 2-3 hours in between voids without urgency or incontinence in order to improve QOL and perform all functional activities with less difficulty.   Baseline: not currently keeping track - she states that she feels too miserable to work on recommending voiding schedule Goal status: IN PROGRESS 07/31/23  3.  Pt will report pelvic pain no greater than 2/10.  Baseline: still getting up to 4-5/10  Goal status: IN PROGRESS 07/31/23  4.  Pt will be able to sit for >30 minutes without any increased pelvic pain.  Baseline: She has pain only up to 1/10 if she is sitting with her specialized pillow - but has to use pillow Goal status: IN PROGRESS 07/31/23  5.  Pt will be able to stand on single leg without pelvic drop and squat without weight shift in order to demonstrate improved functional core and hip strength to more appropriately stabilize pelvis.  Baseline:  Goal status: MET 07/31/23  6.  Pt will demonstrate increase in all impaired hip strength by 1 muscle grades in order to demonstrate improved lumbopelvic support and increase functional ability.   Baseline: many improved; bil extension 4-/5 Goal status: IN PROGRESS 07/31/23  PLAN:  PT FREQUENCY: 1-2x/week  PT DURATION: 12 weeks  PLANNED INTERVENTIONS: 97110-Therapeutic exercises, 97530- Therapeutic activity, 97112- Neuromuscular re-education, 97535- Self Care, [id number]- Manual therapy, Dry Needling, and Biofeedback  PLAN FOR NEXT SESSION: Plan to work on breathing techniques and manual techniques to abdomen; dry needling to Lt adductors; continue pelvic floor muscle release; adductor stretches; pudendal nerve glides    Josette Mares, PT, DPT07/29/2510:56 AM

## 2023-08-09 ENCOUNTER — Ambulatory Visit
Admission: RE | Admit: 2023-08-09 | Discharge: 2023-08-09 | Disposition: A | Discharge status: Home / Self Care | Source: Ambulatory Visit | Attending: Gastroenterology | Admitting: Gastroenterology

## 2023-08-09 DIAGNOSIS — D509 Iron deficiency anemia, unspecified: Secondary | ICD-10-CM | POA: Diagnosis present

## 2023-08-09 DIAGNOSIS — Q453 Other congenital malformations of pancreas and pancreatic duct: Secondary | ICD-10-CM | POA: Diagnosis present

## 2023-08-09 DIAGNOSIS — R7989 Other specified abnormal findings of blood chemistry: Secondary | ICD-10-CM | POA: Insufficient documentation

## 2023-08-09 NOTE — Telephone Encounter (Signed)
 Pt had sent a mychart message. I have replied advising the pt of the next steps and will await her response.

## 2023-08-15 ENCOUNTER — Ambulatory Visit: Attending: Obstetrics and Gynecology

## 2023-08-15 DIAGNOSIS — M25552 Pain in left hip: Secondary | ICD-10-CM | POA: Diagnosis present

## 2023-08-15 DIAGNOSIS — M62838 Other muscle spasm: Secondary | ICD-10-CM | POA: Diagnosis present

## 2023-08-15 DIAGNOSIS — R102 Pelvic and perineal pain: Secondary | ICD-10-CM | POA: Diagnosis present

## 2023-08-15 DIAGNOSIS — R293 Abnormal posture: Secondary | ICD-10-CM | POA: Insufficient documentation

## 2023-08-15 DIAGNOSIS — M6281 Muscle weakness (generalized): Secondary | ICD-10-CM | POA: Insufficient documentation

## 2023-08-15 DIAGNOSIS — R279 Unspecified lack of coordination: Secondary | ICD-10-CM | POA: Insufficient documentation

## 2023-08-15 DIAGNOSIS — M5459 Other low back pain: Secondary | ICD-10-CM | POA: Insufficient documentation

## 2023-08-15 NOTE — Telephone Encounter (Signed)
 At this point, I am not sure what else to offer for her. I am sorry that she feels the way that she feels. What is her current weight/can we have her come in for a weight check?  From a liver standpoint, if her repeat liver tests in 2 weeks still remain elevated, we will do autoimmune hepatitis workup and serologies.  She may require liver biopsy if these persist into the coming months, though she has had some slight Months ago in regards to her transferase's.  I am hesitant to consider an appetite stimulant, as a result of patient's potentially having complications from potential side effect profile of these medications.  She is also on multiple agents from a mental health standpoint, so there is some concern about adding additional medications that could have effects on the serotonin epinephrine  levels in the central nervous system.  So we will uncomfortable with continuing to try to add on other medicines.  I do think reaching out to functional gastroenterologist, Dr. Lu, could be helpful, but he does not take insurance and everything is cash pay which can be difficult for some patients/individuals.  [facility] and [facility], have second opinion clinics that may be available, and if that something she wants to pursue would like us  to put a referral we can certainly do that (she will not lose us  as a GI provider persea).

## 2023-08-15 NOTE — Therapy (Signed)
 OUTPATIENT PHYSICAL THERAPY FEMALE PELVIC TREATMENT   Patient Name: Jessica Miles MRN: [id number] DOB:Dec 11, 1945, 78 y.o., female Today's Date: 08/15/2023  END OF SESSION:  PT End of Session - 08/15/23 1102     Visit Number 32    Date for PT Re-Evaluation 10/23/23    Authorization Type Medicare - needs Kx at 15    Progress Note Due on Visit 40    PT Start Time 1100    PT Stop Time 1123    PT Time Calculation (min) 23 min    Activity Tolerance Patient tolerated treatment well    Behavior During Therapy [facility] for tasks assessed/performed             Past Medical History:  Diagnosis Date   Allergy    Anxiety    COVID-19 virus infection    09-23-2019- 9-16 had monoclonal infusion    Depression    History of IBS    HPV (human papilloma virus) infection    Hyperlipidemia    83- not taking statin    Loss of taste    Neuropathy 2015   surgery for Mortons neuroma in 1985   Osteopenia    Osteoporosis    Raynaud's disease 02/2020   Tachycardia    Thyroid  disease    Past Surgical History:  Procedure Laterality Date   ANAL RECTAL MANOMETRY N/A 05/03/2023   Procedure: CALA FERNS;  Surgeon: Wilhelmenia Aloha Raddle., MD;  Location: [facility];  Service: Gastroenterology;  Laterality: N/A;   BIOPSY  01/25/2022   Procedure: BIOPSY;  Surgeon: Wilhelmenia Aloha Raddle., MD;  Location: [facility];  Service: Gastroenterology;;   COLONOSCOPY  2019   hems only    ESOPHAGOGASTRODUODENOSCOPY (EGD) WITH PROPOFOL  N/A 01/25/2022   Procedure: ESOPHAGOGASTRODUODENOSCOPY (EGD) WITH PROPOFOL ;  Surgeon: Wilhelmenia Aloha Raddle., MD;  Location: [facility];  Service: Gastroenterology;  Laterality: N/A;   EUS N/A 01/25/2022   Procedure: UPPER ENDOSCOPIC ULTRASOUND (EUS) LINEAR;  Surgeon: Wilhelmenia Aloha Raddle., MD;  Location: [facility];  Service: Gastroenterology;  Laterality: N/A;   FOOT NEUROMA SURGERY  1985   WISDOM TOOTH EXTRACTION     Patient Active Problem List   Diagnosis Date  Noted   Numbness and tingling 08/01/2023   Weight loss, unintentional 07/30/2023   Lower abdominal pain 07/30/2023   Abnormality of pancreatic duct 07/30/2023   Change in bowel habits 07/30/2023   Elevated CK 07/30/2023   Incontinence of feces 07/30/2023   Muscle cramping 07/25/2023   Abnormal laboratory test result 06/09/2023   Cardiac arrhythmia 06/09/2023   Encounter for medication refill 06/09/2023   Finding of above normal blood pressure 06/09/2023   Iatrogenic thyrotoxicosis 06/09/2023   Menopausal symptom 06/09/2023   Muscle weakness of extremity 06/09/2023   Need for vaccination 06/09/2023   Neuroma of foot 06/09/2023   Nuclear senile cataract 06/09/2023   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   Disorder of skin 06/09/2023   Encounter for routine chest x-ray 06/09/2023   Labial irritation 06/07/2023   Vertigo 03/10/2023   Right upper quadrant abdominal mass 01/26/2023   Panic attacks 10/26/2022   Allergic reaction to insect bite 06/23/2022   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   Severely underweight adult 09/07/2021   Acute diarrhea 04/27/2021   Inflamed sebaceous cyst 02/11/2021   Transient disorientation 10/20/2020   Memory change 08/05/2020   Family history of early CAD 02/27/2020   Tremor 02/27/2020   Aortic atherosclerosis (HCC) 12/19/2019   Urinary urgency 05/21/2019   Bladder irritation 05/21/2019   External  hemorrhoid 05/21/2019   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   Raynaud's syndrome 02/09/2019   Osteoarthritis of thumb, 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   Fatigue 12/19/2017   Acute left-sided low back pain without sciatica 12/19/2017   SVT (supraventricular tachycardia) (HCC) 08/31/2017   Peripheral neuropathy 08/31/2017   Elbow pain 10/21/2015   Dry eyes 09/21/2015   Atopic dermatitis 03/13/2015   IBS (irritable bowel syndrome) 03/18/2014   Osteoporosis 03/18/2014   Major depressive disorder, recurrent episode, moderate (HCC) 03/18/2014   Hypothyroidism 12/14/2013     PCP: Avelina Greig BRAVO, MD  REFERRING PROVIDER: Darol Norris, MD  REFERRING DIAG: M79.18 (ICD-10-CM) - Myalgia, other site G58.8 (ICD-10-CM) - Other specified mononeuropathies  THERAPY DIAG:  Other muscle spasm  Muscle weakness (generalized)  Pain in left hip  Other low back pain  Pelvic pain  Unspecified lack of coordination  Abnormal posture  Rationale for Evaluation and Treatment: Rehabilitation  ONSET DATE: 3 years   SUBJECTIVE:                                                                                                                                                                                           SUBJECTIVE STATEMENT: Pt states that she marked on her Lt buttocks where her pain is worst right now. Pt states that she is having many bowel movements for the last 3-4 days and she is really frustrated.   PAIN: 08/15/23 Are you having pain? Yes NPRS scale: 7/10 Pain location: Lt hip and perineum/vulva; lower abdomen discomfort  Pain type: soreness Pain description: intermittent   Aggravating factors: sitting Relieving factors: lying flat, sitting on toilet  PRECAUTIONS: Other: large mass in Rt lower quadrant that has not been treated by medical team  RED FLAGS: None   WEIGHT BEARING RESTRICTIONS: No  FALLS:  Has patient fallen in last 6 months? No  LIVING ENVIRONMENT: Lives with: lives with their family Lives in: House/apartment  OCCUPATION: retired Runner, broadcasting/film/video  PLOF: Independent  PATIENT GOALS: decrease pain  PERTINENT HISTORY:  Interstitial cystitis   Updated 04/13/23:  BOWEL MOVEMENT: Pain with bowel movement: No Type of bowel movement:Frequency up to 5x/hour anywhere from solid to diarrhea; she will go 1-2 hours after eating on average and Strain No Fully empty rectum: No Leakage: Yes: seed size stool can come out without sensation when walking   Pads: No Fiber supplement: No  URINATION: Pain with urination: No Fully empty  bladder: Yes: - Stream: Weak Urgency: No - but not drinking much and goes every time she walks by the bathroom  Frequency: as often as she can - very small amounts  Leakage: none Pads: No  INTERCOURSE:  NA  PREGNANCY: Vaginal deliveries 2 Tearing No C-section deliveries 0 Currently pregnant No  PROLAPSE: None   OBJECTIVE:  Note: Objective measures were completed at Evaluation unless otherwise noted.   07/31/23 LOWER EXTREMITY MMT:  MMT Right eval Left eval  Hip abduction 5/5 5/5  Hip adduction 4+/5 4+/5  Hip internal rotation 5/5 5/5  Hip external rotation 5/5 4/5  Hip extension 4-/5 4-/5                  Internal Pelvic Floor significant superficial tenderness with report of burning, pinching and stinging; muscle spasm and aching in Lt deep pelvic floor muscle   Patient confirms identification and approves PT to assess internal pelvic floor and treatment Yes   PELVIC MMT:    MMT 07/31/23  Vaginal 3/5, 7 second hold, 4 repeat contractions  Rectal 2/5, 4 seconds  (Blank rows = not tested)        TONE: Very low tone overall; some vaginal stenosis that feels worse on the Lt side compared to Rt; Lt obturator muscle  PROLAPSE: Not tested   Single leg stance:  Rt: WNL  Lt: WNL  Palpation: tenderness in LT adductors/mediual hamstirngs with rigger points present   05/09/23: Grade 1 uterine ligament laxity Significant tenderness with palpation of cervix Significant improvement with paradoxical contraction - not present anymore with lift when squeezing and appropriate lengthening with bearing down    04/25/23: Grade 1 uterine ligament laxity Significant tenderness with palpation of cervix Paradoxical pelvic floor muscle contraction with attempting to bear down to look for anterior/posterior vaginal wall laxity (could be fear of releasing gas/urine)  04/13/23:               External Perineal Exam dryness, tenderness, labial fusion bil                              Internal Pelvic Floor Pt has increased bil pelvic floor muscle tenderness and tightness; bladder restriction and reproduction of urgency with palpation; reproduction of lower abdominal pain with palpation of tight anterior Rt pelvic floor muscles  Patient confirms identification and approves PT to assess internal pelvic floor and treatment Yes   PELVIC MMT:    MMT 04/13/23  Vaginal 2/5, 5 second hold, 5 repeat contractions  (Blank rows = not tested)        TONE: High Bil  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, swelling of quadratus femoris muscle, tendinopathic and frayed hamstring origin Scheduled for anal manometry in April 2025   COGNITION: Overall cognitive status: Within functional limits for tasks assessed     SENSATION:  Light touch: Appears intact Proprioception: Appears intact   FUNCTIONAL/SPECIAL TESTS:  Single leg stance:  Rt: more stable, some Lt drop - compensated trendelenburg  Lt: less stable, more Rt drop - compensated trendelenburg Squat: Lt weight shift, Rt valgus knee collapse FAIR: (+) Lt hip Hamstring length: significant decrease with pain on Lt, -75 degrees Curl-up test: breath holding, but no distortion, 2 finger width diastasis in upper abdominals Bed mobility: breath holding  Palpable transversus abdominus contraction bil, but  weak Tenderness at Lt ischial tuberosity and hamstrings/Lt posterolateral hip mm  GAIT: Comments: WNL  POSTURE: forward head, decreased lumbar lordosis, posterior pelvic tilt, and Lt lower thoracic/upper lumbar curvature, elevated Lt iliac crest  PELVIC ALIGNMENT: Lt posterior rotation, Lt sacral facing rotation   LUMBARAROM/PROM:  A/PROM A/PROM  Eval (% available)  Flexion 75, pain on LT  Extension 75  Right lateral flexion 100  Left lateral flexion 100, pain in back and Lt pelvis  Right rotation 75  Left rotation 50, feels good on back   (Blank rows = not tested)   LOWER EXTREMITY MMT:  MMT Right eval Left eval  Hip abduction 5/5 5/5  Hip adduction    Hip internal rotation 3/5 4-/5  Hip external rotation 3/5 4/5   PALPATION:   General: Large mass located in Rt abdomen lateral to umbilicus, non-tender, but with pressure it caused discomfort in low back - patient had not noticed before and reports no doctor has mentioned to her before    TODAY'S TREATMENT:                                                                                                                              DATE:  08/15/23 Therapeutic activities: Education on consistency with her medications Impact of anxiety on her bowel movements - with muscle relaxants and anti-anxiety medication she is seeing large increase in motility Her current level of bowel movements and their consistency indicates significant back up Pt education on location of marks she made for pain on posterior hip/thigh most consistent with hamstring issue   08/08/23 Therapeutic activities: Reviewed different pain and what the pudendal nerve block should have done in order to help reassure her that she is seeing some progress We reviewed the same pain concepts and anatomy for bladder instillation to reassure her that it appears that procedure was helpful We discussed POC and what PT can offer her at this point - she is going to  think about benefit from sessions and consider discharging for now but does not want to cancel appointments at this time until she sees MD tomorrow Reviewed importance of down training and relaxation techniques   07/31/23 RE-EVAL Manual: Pt provides verbal consent for internal vaginal/rectal pelvic floor exam. Internal vaginal and rectal exams for reassessment Neuromuscular re-education: Pelvic floor muscle contraction training vaginally and rectally with breath coordination  Rectal push training with exale and eccentrically lengthening pelvic floor muscles Therapeutic activities: Continued education  and encouragement with being consistent with what has been recommended Revisited voiding schedule and strongly encouraged since she has not felt well enough to perform   PATIENT EDUCATION:  Education details: See above Person educated: Patient Education method: Explanation, Demonstration, Tactile cues, Verbal cues, and Handouts Education comprehension: verbalized understanding  HOME EXERCISE PROGRAM: [email or web address]  ASSESSMENT:  CLINICAL IMPRESSION: Pt was encouraged that it sounds like her body is clearing out stool with this many large bowel movements over the last 3-4 days. Because she is having 6-8 bowel movements a day that are long and they are Ugh Pain And Spine Stool Scale 4, this indicates that she has had significant stool burden. She was encouraged that this is a good thing and it sounds like with her current medications she is able to relax more and allow for more productive bowel movements. She was encouraged to stay consistent with what she is working on. Believe where she has marked on her Lt LE for pain is hamstring; with known hamstring tear and labral tear on that side, believe this is likely the source of her pain; she was encouraged that pudendal nerve is likely not the source of pain in this area. Believe she may still benefit form skilled PT intervention in order to continue helping with  pain management, strengthen areas of weakness, try to continue consistency of exercise and intervention, perform pain science education and down training, and improve quality of life.  OBJECTIVE IMPAIRMENTS: decreased activity tolerance, decreased coordination, decreased endurance, decreased mobility, decreased strength, increased fascial restrictions, increased muscle spasms, impaired tone, postural dysfunction, and pain.   ACTIVITY LIMITATIONS: lifting, bending, sitting, squatting, and bed mobility  PARTICIPATION LIMITATIONS: cleaning, driving, and community activity  PERSONAL FACTORS: 1 comorbidity: medical history are also affecting patient's functional outcome.   REHAB POTENTIAL: Good  CLINICAL DECISION MAKING: Evolving/moderate complexity  EVALUATION COMPLEXITY: Moderate   GOALS: Goals reviewed with patient? Yes  SHORT TERM GOALS: Updated 07/31/23  Pt will be independent with HEP.   Baseline: Goal status: MET 04/13/23  2.  Pt will be able to teach back and utilize urge suppression technique in order to help reduce number of trips to the bathroom.    Baseline:  Goal status: MET 05/09/23  3.  Pt will begin use of pelvic floor muscle wand on regular basis (2-3x/week) in order to help reduce pelvic floor muscle tension and pain. Baseline: has not purchased at of now Goal status: IN PROGRESS 07/31/23  4.  Pt will be independent with diaphragmatic breathing and down training activities in order to improve pelvic floor relaxation.  Baseline:  Goal status: MET 05/09/23  5.  Pt will report pelvic pain no greater than 4/10. Baseline:  Goal status: MET 05/09/23   LONG TERM GOALS: Updated 07/31/23  Pt will be independent with advanced HEP.   Baseline:  Goal status: IN PROGRESS 07/31/23  2.  Pt will be able to go 2-3 hours in between voids without urgency or incontinence in order to improve QOL and perform all functional activities with less difficulty.   Baseline: not  currently keeping track - she states that she feels too miserable to work on recommending voiding schedule Goal status: IN PROGRESS 07/31/23  3.  Pt will report pelvic pain no greater than 2/10.  Baseline: still getting up to 4-5/10 Goal status: IN PROGRESS 07/31/23  4.  Pt will be able to sit for >30 minutes without any increased pelvic pain.  Baseline: She has pain only up to 1/10 if she  is sitting with her specialized pillow - but has to use pillow Goal status: IN PROGRESS 07/31/23  5.  Pt will be able to stand on single leg without pelvic drop and squat without weight shift in order to demonstrate improved functional core and hip strength to more appropriately stabilize pelvis.  Baseline:  Goal status: MET 07/31/23  6.  Pt will demonstrate increase in all impaired hip strength by 1 muscle grades in order to demonstrate improved lumbopelvic support and increase functional ability.   Baseline: many improved; bil extension 4-/5 Goal status: IN PROGRESS 07/31/23  PLAN:  PT FREQUENCY: 1-2x/week  PT DURATION: 12 weeks  PLANNED INTERVENTIONS: 97110-Therapeutic exercises, 97530- Therapeutic activity, 97112- Neuromuscular re-education, 97535- Self Care, [id number]- Manual therapy, Dry Needling, and Biofeedback  PLAN FOR NEXT SESSION: Plan to work on breathing techniques and manual techniques to abdomen; dry needling to Lt adductors; continue pelvic floor muscle release; adductor stretches; pudendal nerve glides    Josette Mares, PT, DPT08/05/2509:50 AM

## 2023-08-16 ENCOUNTER — Other Ambulatory Visit: Payer: Self-pay

## 2023-08-16 DIAGNOSIS — Q453 Other congenital malformations of pancreas and pancreatic duct: Secondary | ICD-10-CM

## 2023-08-16 DIAGNOSIS — R7989 Other specified abnormal findings of blood chemistry: Secondary | ICD-10-CM

## 2023-08-16 NOTE — Telephone Encounter (Signed)
 Dr Wilhelmenia what labs would you like order for auto immune hepatitis and serologies?

## 2023-08-17 NOTE — Telephone Encounter (Signed)
 Reasonable. GM

## 2023-08-23 ENCOUNTER — Encounter: Payer: Self-pay | Admitting: Gastroenterology

## 2023-08-23 DIAGNOSIS — R634 Abnormal weight loss: Secondary | ICD-10-CM

## 2023-08-24 NOTE — Telephone Encounter (Signed)
 Dr Wilhelmenia see the message from the pt

## 2023-08-25 NOTE — Addendum Note (Signed)
 Addended by: ANITRA ODETTA CROME on: 08/25/2023 09:40 AM   Modules accepted: Orders

## 2023-08-25 NOTE — Telephone Encounter (Signed)
 You may add ESR/CRP (not high-sensitivity) to next week's labs. This can be worked up for her unintentional weight loss. Thanks. GM

## 2023-08-28 NOTE — Progress Notes (Unsigned)
 Office Visit Note  Patient: Jessica Miles             Date of Birth: 10/02/1945           MRN: [id number]             PCP: Avelina Greig BRAVO, MD Referring: Avelina Greig BRAVO, MD Visit Date: 08/31/2023 Occupation: @GUAROCC @  Subjective:  No chief complaint on file.    History of Present Illness: Jessica Miles is a 78 y.o. female ***   Activities of Daily Living:  Patient reports morning stiffness for *** {minute/hour:19697}.   Patient {ACTIONS;DENIES/REPORTS:21021675::Denies} nocturnal pain.  Difficulty dressing/grooming: {ACTIONS;DENIES/REPORTS:21021675::Denies} Difficulty climbing stairs: {ACTIONS;DENIES/REPORTS:21021675::Denies} Difficulty getting out of chair: {ACTIONS;DENIES/REPORTS:21021675::Denies} Difficulty using hands for taps, buttons, cutlery, and/or writing: {ACTIONS;DENIES/REPORTS:21021675::Denies}  No Rheumatology ROS completed.    Rheum History: # Diagnosed in ***.  Manifestation of disease:   Serologies: (+) *** (-) ***  Maintenance Labs: QuantiFERON: *** Hepatitis panel: ***  Current Treatment ***  Prior Treatments ***   PMFS History:  Patient Active Problem List   Diagnosis Date Noted   Numbness and tingling 08/01/2023   Weight loss, unintentional 07/30/2023   Lower abdominal pain 07/30/2023   Abnormality of pancreatic duct 07/30/2023   Change in bowel habits 07/30/2023   Elevated CK 07/30/2023   Incontinence of feces 07/30/2023   Muscle cramping 07/25/2023   Abnormal laboratory test result 06/09/2023   Cardiac arrhythmia 06/09/2023   Encounter for medication refill 06/09/2023   Finding of above normal blood pressure 06/09/2023   Iatrogenic thyrotoxicosis 06/09/2023   Menopausal symptom 06/09/2023   Muscle weakness of extremity 06/09/2023   Need for vaccination 06/09/2023   Neuroma of foot 06/09/2023   Nuclear senile cataract 06/09/2023   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   Disorder of skin 06/09/2023    Encounter for routine chest x-ray 06/09/2023   Labial irritation 06/07/2023   Vertigo 03/10/2023   Right upper quadrant abdominal mass 01/26/2023   Panic attacks 10/26/2022   Allergic reaction to insect bite 06/23/2022   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   Severely underweight adult 09/07/2021   Acute diarrhea 04/27/2021   Inflamed sebaceous cyst 02/11/2021   Transient disorientation 10/20/2020   Memory change 08/05/2020   Family history of early CAD 02/27/2020   Tremor 02/27/2020   Aortic atherosclerosis (HCC) 12/19/2019   Urinary urgency 05/21/2019   Bladder irritation 05/21/2019   External hemorrhoid 05/21/2019   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   Raynaud's syndrome 02/09/2019   Osteoarthritis of thumb, 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   Fatigue 12/19/2017   Acute left-sided low back pain without sciatica 12/19/2017   SVT (supraventricular tachycardia) (HCC) 08/31/2017   Peripheral neuropathy 08/31/2017   Elbow pain 10/21/2015   Dry eyes 09/21/2015   Atopic dermatitis 03/13/2015   IBS (irritable bowel syndrome) 03/18/2014   Osteoporosis 03/18/2014   Major depressive disorder, recurrent episode, moderate (HCC) 03/18/2014   Hypothyroidism 12/14/2013    Past Medical History:  Diagnosis Date   Allergy    Anxiety    COVID-19 virus infection    09-23-2019- 9-16 had monoclonal infusion    Depression    History of IBS    HPV (human papilloma virus) infection    Hyperlipidemia    82- not taking statin    Loss of taste    Neuropathy 2015   surgery for Mortons neuroma in 1985   Osteopenia    Osteoporosis    Raynaud's disease 02/2020   Tachycardia    Thyroid  disease  Family History  Problem Relation Age of Onset   Stroke Mother 56   Heart failure Mother    Heart attack Mother    Heart disease Mother    Heart attack Father 38   Heart disease Father    Heart disease Brother    Heart failure Brother        CHF   Stroke Paternal Uncle    Heart disease Maternal Grandmother    Heart attack  Maternal Grandfather    Diabetes Son        IDDM   Thyroid  disease Neg Hx    Stomach cancer Neg Hx    Esophageal cancer Neg Hx    Colon polyps Neg Hx    Colon cancer Neg Hx    Rectal cancer Neg Hx    Inflammatory bowel disease Neg Hx    Liver disease Neg Hx    Pancreatic cancer Neg Hx    Past Surgical History:  Procedure Laterality Date   ANAL RECTAL MANOMETRY N/A 05/03/2023   Procedure: MANOMETRY, ANORECTAL;  Surgeon: Wilhelmenia Aloha Raddle., MD;  Location: [facility];  Service: Gastroenterology;  Laterality: N/A;   BIOPSY  01/25/2022   Procedure: BIOPSY;  Surgeon: Wilhelmenia Aloha Raddle., MD;  Location: [facility];  Service: Gastroenterology;;   COLONOSCOPY  2019   hems only    ESOPHAGOGASTRODUODENOSCOPY (EGD) WITH PROPOFOL  N/A 01/25/2022   Procedure: ESOPHAGOGASTRODUODENOSCOPY (EGD) WITH PROPOFOL ;  Surgeon: Wilhelmenia Aloha Raddle., MD;  Location: [facility];  Service: Gastroenterology;  Laterality: N/A;   EUS N/A 01/25/2022   Procedure: UPPER ENDOSCOPIC ULTRASOUND (EUS) LINEAR;  Surgeon: Wilhelmenia Aloha Raddle., MD;  Location: [facility];  Service: Gastroenterology;  Laterality: N/A;   FOOT NEUROMA SURGERY  1985   WISDOM TOOTH EXTRACTION     Social History   Social History Narrative   12/23/20 Patient lives at [facility] with spouse.   Caffeine Use: 1 cup daily   2 kids- 4 grandchildren- two boys and two girls      Immunization History  Administered Date(s) Administered    sv, Bivalent, Protein Subunit Rsvpref,pf [place]) 03/04/2022   DT (Pediatric) 01/11/2008   Fluad Quad(high Dose 65+) 10/02/2018, 10/10/2019, 10/21/2021   Influenza Split 10/25/2006   Influenza, High Dose Seasonal PF 10/21/2014, 10/12/2017, 10/11/2020, 10/11/2022, 10/12/2022   Influenza, Seasonal, Injecte, Preservative Fre 10/10/2015, 09/29/2016   Influenza-Unspecified 10/21/2011, 10/10/2013   Moderna Covid-19 Fall Seasonal Vaccine 81yrs & older 10/11/2021, 03/26/2022, 09/21/2022, 04/09/2023    Moderna Covid-19 Vaccine Bivalent Booster 50yrs & up 09/27/2020, 05/26/2021   Moderna SARS-COV2 Booster Vaccination 09/27/2020   Moderna Sars-Covid-2 Vaccination 01/24/2019, 02/21/2019, 12/23/2019, 05/16/2020, 10/11/2021, 03/26/2022, 09/28/2022   PFIZER(Purple Top)SARS-COV-2 Vaccination 01/21/2019, 02/21/2019, 12/23/2019, 05/16/2020   Pneumococcal Conjugate-13 10/10/2013, 10/21/2013   Pneumococcal Polysaccharide-23 10/25/2006   Pneumococcal-Unspecified 01/11/2007   Td 08/12/2005   Zoster Recombinant(Shingrix ) 08/17/2019, 12/25/2019   Zoster, Live 01/11/2008, 09/30/2008     Objective: Vital Signs: There were no vitals taken for this visit.   Physical Exam   Musculoskeletal Exam: ***  CDAI Exam: CDAI Score: -- Patient Global: --; Provider Global: -- Swollen: --; Tender: -- Joint Exam 08/31/2023   No joint exam has been documented for this visit   There is currently no information documented on the homunculus. Go to the Rheumatology activity and complete the homunculus joint exam.  Investigation: No additional findings.  Imaging: US  ABDOMEN LIMITED RUQ (LIVER/GB) Result Date: 08/12/2023 CLINICAL DATA:  Elevated LFTs EXAM: ULTRASOUND ABDOMEN LIMITED RIGHT UPPER QUADRANT COMPARISON:  MRI abdomen 07/28/2023 FINDINGS:  Gallbladder: No gallstones or wall thickening visualized. No sonographic Murphy sign noted by sonographer. Common bile duct: Diameter: 2.6 mm Liver: No focal lesion identified. Within normal limits in parenchymal echogenicity. Portal vein is patent on color Doppler imaging with normal direction of blood flow towards the liver. Other: Incidentally identified right renal cysts. IMPRESSION: No cholelithiasis or sonographic evidence for acute cholecystitis. Electronically Signed   By: Bard Moats M.D.   On: 08/12/2023 22:55    Recent Labs: Lab Results  Component Value Date   WBC 5.6 07/25/2023   HGB 12.8 07/25/2023   PLT 235.0 07/25/2023   NA 132 (L) 08/07/2023   K 4.3  08/07/2023   CL 95 (L) 08/07/2023   CO2 28 08/07/2023   GLUCOSE 107 (H) 08/07/2023   BUN 23 08/07/2023   CREATININE 0.99 08/07/2023   BILITOT 0.8 08/07/2023   ALKPHOS 72 08/07/2023   AST 44 (H) 08/07/2023   ALT 50 (H) 08/07/2023   PROT 6.9 08/07/2023   ALBUMIN 4.8 08/07/2023   CALCIUM  9.8 08/07/2023    Speciality Comments: No specialty comments available.  Procedures:  No procedures performed Allergies: Fluticasone, Mometasone, and Fexofenadine   Assessment / Plan:     Visit Diagnoses: No diagnosis found.  #High risk medication use  Orders: No orders of the defined types were placed in this encounter.  No orders of the defined types were placed in this encounter.   Face-to-face time spent with patient was *** minutes. Greater than 50% of time was spent in counseling and coordination of care.  Follow-Up Instructions: No follow-ups on file.   Asberry Claw, DO

## 2023-08-30 ENCOUNTER — Ambulatory Visit

## 2023-08-31 ENCOUNTER — Other Ambulatory Visit (INDEPENDENT_AMBULATORY_CARE_PROVIDER_SITE_OTHER)

## 2023-08-31 ENCOUNTER — Ambulatory Visit

## 2023-08-31 VITALS — BP 126/73 | HR 83 | Resp 12 | Ht 66.25 in | Wt 102.2 lb

## 2023-08-31 DIAGNOSIS — R748 Abnormal levels of other serum enzymes: Secondary | ICD-10-CM | POA: Diagnosis not present

## 2023-08-31 DIAGNOSIS — Q453 Other congenital malformations of pancreas and pancreatic duct: Secondary | ICD-10-CM

## 2023-08-31 DIAGNOSIS — R252 Cramp and spasm: Secondary | ICD-10-CM

## 2023-08-31 DIAGNOSIS — R7989 Other specified abnormal findings of blood chemistry: Secondary | ICD-10-CM | POA: Diagnosis not present

## 2023-08-31 DIAGNOSIS — M545 Low back pain, unspecified: Secondary | ICD-10-CM

## 2023-08-31 DIAGNOSIS — R634 Abnormal weight loss: Secondary | ICD-10-CM

## 2023-08-31 LAB — HEPATIC FUNCTION PANEL
ALT: 34 U/L (ref 0–35)
AST: 29 U/L (ref 0–37)
Albumin: 4.7 g/dL (ref 3.5–5.2)
Alkaline Phosphatase: 73 U/L (ref 39–117)
Bilirubin, Direct: 0.1 mg/dL (ref 0.0–0.3)
Total Bilirubin: 0.6 mg/dL (ref 0.2–1.2)
Total Protein: 7.6 g/dL (ref 6.0–8.3)

## 2023-08-31 LAB — SEDIMENTATION RATE: Sed Rate: 8 mm/h (ref 0–30)

## 2023-08-31 LAB — C-REACTIVE PROTEIN: CRP: <1 mg/dL (ref 0.5–20.0)

## 2023-09-01 ENCOUNTER — Ambulatory Visit: Payer: Self-pay | Admitting: Gastroenterology

## 2023-09-04 ENCOUNTER — Other Ambulatory Visit

## 2023-09-07 ENCOUNTER — Encounter

## 2023-09-14 ENCOUNTER — Encounter

## 2023-09-19 ENCOUNTER — Ambulatory Visit: Payer: Self-pay

## 2023-09-19 NOTE — Telephone Encounter (Signed)
 FYI Only or Action Required?: FYI only for provider.  Patient was last seen in primary care on 07/25/2023 by Avelina Greig BRAVO, MD.  Called Nurse Triage reporting Sore Throat.  Symptoms began several weeks ago.  Interventions attempted: Nothing.  Symptoms are: unchanged.  Triage Disposition: See Physician Within 24 Hours  Patient/caregiver understands and will follow disposition?: Yes  Patient states no difficulty swallowing but can feel it when she swallows and can feel it from the outside.    Copied from CRM [id number]. Topic: Clinical - Red Word Triage >> Sep 19, 2023  8:09 AM Robinson H wrote: [facility] that prompted transfer to Nurse Triage: Something pressing on larynx and going up to ear, making it difficult to swallow, feeling pressure. Had it for 3 weeks but getting worse Reason for Disposition  Earache also present  Answer Assessment - Initial Assessment Questions 1. ONSET: When did the throat start hurting? (Hours or days ago)      3 weeks  and feels it's getting worse 2. SEVERITY: How bad is the sore throat? (Scale 1-10; mild, moderate or severe)     Difficult to swallow, feeling pressure against larynx goes up to right ear.  Denies pain 3. STREP EXPOSURE: Has there been any exposure to strep within the past week? If Yes, ask: What type of contact occurred?      denies 4.  VIRAL SYMPTOMS: Are there any symptoms of a cold, such as a runny nose, cough, hoarse voice or red eyes?      Post nasal drip, allergies, states using netti pot 3-4 times a day 5. FEVER: Do you have a fever? If Yes, ask: What is your temperature, how was it measured, and when did it start?     denies 6. PUS ON THE TONSILS: Is there pus on the tonsils in the back of your throat?     denies 7. OTHER SYMPTOMS: Do you have any other symptoms? (e.g., difficulty breathing, headache, rash)     no 8. PREGNANCY: Is there any chance you are pregnant? When was your last menstrual period?      na  Protocols used: Sore Throat-A-AH

## 2023-09-19 NOTE — Telephone Encounter (Signed)
 Agreed -

## 2023-09-20 ENCOUNTER — Ambulatory Visit (INDEPENDENT_AMBULATORY_CARE_PROVIDER_SITE_OTHER): Admitting: Family Medicine

## 2023-09-20 ENCOUNTER — Encounter: Payer: Self-pay | Admitting: Family Medicine

## 2023-09-20 VITALS — BP 128/76 | HR 73 | Temp 98.9°F | Ht 66.25 in | Wt 102.6 lb

## 2023-09-20 DIAGNOSIS — R0982 Postnasal drip: Secondary | ICD-10-CM | POA: Insufficient documentation

## 2023-09-20 DIAGNOSIS — R070 Pain in throat: Secondary | ICD-10-CM | POA: Insufficient documentation

## 2023-09-20 DIAGNOSIS — H938X1 Other specified disorders of right ear: Secondary | ICD-10-CM | POA: Diagnosis not present

## 2023-09-20 NOTE — Progress Notes (Signed)
 Patient ID: Jessica Miles, female    DOB: 02-Jan-1946, 78 y.o.   MRN: [id number]  This visit was conducted in person.  BP 128/76   Pulse 73   Temp 98.9 F (37.2 C) (Oral)   Ht 5' 6.25 (1.683 m)   Wt 102 lb 9.6 oz (46.5 kg)   SpO2 97%   BMI 16.44 kg/m    CC:  Chief Complaint  Patient presents with   Throat Issues    Patient states that she feels that she has pressure in her throat. Its running from her ear to throat. Not painful.     Subjective:   HPI: Jessica Miles is a 78 y.o. female presenting on 09/20/2023 for Throat Issues (Patient states that she feels that she has pressure in her throat. Its running from her ear to throat. Not painful. )   New onset pressure in throat, right ear feels blocked, feels pressure with swallowing . Noting in last 3 weeks, gradually increasing.  Tried peroxide in right ear.. no help.   No fever, no ST, no cough.   Has history of post nasal drip, sinus issues and allergies... using Netty Pot.   No reflux  or heart burn.   Wt Readings from Last 3 Encounters:  09/20/23 102 lb 9.6 oz (46.5 kg)  08/31/23 102 lb 3.2 oz (46.4 kg)  07/26/23 103 lb (46.7 kg)    Was on a taper of prednisone       Relevant past medical, surgical, family and social history reviewed and updated as indicated. Interim medical history since our last visit reviewed. Allergies and medications reviewed and updated. Outpatient Medications Prior to Visit  Medication Sig Dispense Refill   buPROPion  ER (WELLBUTRIN  SR) 100 MG 12 hr tablet Take 100 mg by mouth daily.     busPIRone  (BUSPAR ) 5 MG tablet Take 1 tablet (5 mg total) by mouth 2 (two) times daily as needed (anxiety). 180 tablet 0   Calcium  Carb-Cholecalciferol (CALCIUM  600 + D PO) Take 1 tablet by mouth in the morning. 600 mg /400 mcg     ketoconazole  (NIZORAL ) 2 % cream Apply 1 Application topically 2 (two) times daily as needed for irritation.  Apply between the third and fourth toe twice a day as needed      levothyroxine  (SYNTHROID ) 112 MCG tablet Take 1 tablet (112 mcg total) by mouth daily. 15 tablet 0   Lifitegrast (XIIDRA) 5 % SOLN Place 1 drop into both eyes 2 (two) times daily.     Magnesium  250 MG TABS Take 1 tablet by mouth daily.     Methylcellulose, Laxative, (CITRUCEL) 500 MG TABS Take 6 tablets by mouth daily.     methylPREDNISolone  (MEDROL  DOSEPAK) 4 MG TBPK tablet Take by mouth as directed.     nitroGLYCERIN  (NITRODUR - DOSED IN MG/24 HR) 0.2 mg/hr patch Place 0.2 mg onto the skin daily as needed.     Probiotic Product (PROBIOTIC PO) Take 1 tablet by mouth daily. Miome     triamcinolone  cream (KENALOG ) 0.5 % Apply 1 Application topically 2 (two) times daily as needed.     alendronate  (FOSAMAX ) 70 MG tablet Take 1 tablet (70 mg total) by mouth every 7 (seven) days. Take with a full glass of water on an empty stomach. (Patient not taking: Reported on 09/20/2023) 4 tablet 11   ALPRAZolam  (XANAX ) 1 MG tablet Take 1 tablet (1 mg total) by mouth at bedtime as needed (muscle spasms). (Patient not taking: Reported on 09/20/2023) 30  tablet 0   atenolol (TENORMIN) 25 MG tablet Take 25 mg by mouth daily as needed (palpitations/pulse racing).     baclofen (LIORESAL) 10 MG tablet Take 10 mg by mouth daily. (Patient not taking: Reported on 09/20/2023)     colestipol  (COLESTID ) 1 g tablet Take 1 g by mouth daily. (Patient not taking: Reported on 09/20/2023)     estradiol  (ESTRACE ) 0.1 MG/GM vaginal cream 0.5 g of cream intravaginally administered daily at bedtime for 2 weeks, then reduce to twice weekly (Patient not taking: Reported on 09/20/2023) 42.5 g 12   mupirocin  cream (BACTROBAN ) 2 % Apply 1 Application topically 2 (two) times daily as needed. (Patient not taking: Reported on 09/20/2023)     oxyCODONE (OXY IR/ROXICODONE) 5 MG immediate release tablet Take by mouth. (Patient not taking: Reported on 09/20/2023)     No facility-administered medications prior to visit.     Per HPI unless specifically  indicated in ROS section below Review of Systems  Constitutional:  Negative for fatigue and fever.  HENT:  Negative for congestion.   Eyes:  Negative for pain.  Respiratory:  Negative for cough and shortness of breath.   Cardiovascular:  Negative for chest pain, palpitations and leg swelling.  Gastrointestinal:  Negative for abdominal pain.  Genitourinary:  Negative for dysuria and vaginal bleeding.  Musculoskeletal:  Negative for back pain.  Neurological:  Negative for syncope, light-headedness and headaches.  Psychiatric/Behavioral:  Negative for dysphoric mood.    Objective:  BP 128/76   Pulse 73   Temp 98.9 F (37.2 C) (Oral)   Ht 5' 6.25 (1.683 m)   Wt 102 lb 9.6 oz (46.5 kg)   SpO2 97%   BMI 16.44 kg/m   Wt Readings from Last 3 Encounters:  09/20/23 102 lb 9.6 oz (46.5 kg)  08/31/23 102 lb 3.2 oz (46.4 kg)  07/26/23 103 lb (46.7 kg)      Physical Exam Constitutional:      General: She is not in acute distress.    Appearance: Normal appearance. She is well-developed. She is not ill-appearing or toxic-appearing.  HENT:     Head: Normocephalic.     Right Ear: Hearing, tympanic membrane, ear canal and external ear normal. Tympanic membrane is not erythematous, retracted or bulging.     Left Ear: Hearing, tympanic membrane, ear canal and external ear normal. Tympanic membrane is not erythematous, retracted or bulging.     Nose: No mucosal edema or rhinorrhea.     Right Sinus: No maxillary sinus tenderness or frontal sinus tenderness.     Left Sinus: No maxillary sinus tenderness or frontal sinus tenderness.     Mouth/Throat:     Pharynx: Uvula midline.  Eyes:     General: Lids are normal. Lids are everted, no foreign bodies appreciated.     Conjunctiva/sclera: Conjunctivae normal.     Pupils: Pupils are equal, round, and reactive to light.  Neck:     Thyroid : No thyroid  mass or thyromegaly.     Vascular: No carotid bruit.     Trachea: Trachea normal.   Cardiovascular:     Rate and Rhythm: Normal rate and regular rhythm.     Pulses: Normal pulses.     Heart sounds: Normal heart sounds, S1 normal and S2 normal. No murmur heard.    No friction rub. No gallop.  Pulmonary:     Effort: Pulmonary effort is normal. No tachypnea or respiratory distress.     Breath sounds: Normal breath sounds. No  decreased breath sounds, wheezing, rhonchi or rales.  Abdominal:     General: Bowel sounds are normal.     Palpations: Abdomen is soft.     Tenderness: There is no abdominal tenderness.  Musculoskeletal:     Cervical back: Normal range of motion and neck supple.  Skin:    General: Skin is warm and dry.     Findings: No rash.  Neurological:     Mental Status: She is alert.  Psychiatric:        Mood and Affect: Mood is not anxious or depressed.        Speech: Speech normal.        Behavior: Behavior normal. Behavior is cooperative.        Thought Content: Thought content normal.        Judgment: Judgment normal.       Results for orders placed or performed in visit on 08/31/23  C-reactive protein   Collection Time: 08/31/23  9:26 AM  Result Value Ref Range   CRP <1.0 0.5 - 20.0 mg/dL  Sedimentation rate   Collection Time: 08/31/23  9:26 AM  Result Value Ref Range   Sed Rate 8 0 - 30 mm/hr  Hepatic function panel   Collection Time: 08/31/23  9:26 AM  Result Value Ref Range   Total Bilirubin 0.6 0.2 - 1.2 mg/dL   Bilirubin, Direct 0.1 0.0 - 0.3 mg/dL   Alkaline Phosphatase 73 39 - 117 U/L   AST 29 0 - 37 U/L   ALT 34 0 - 35 U/L   Total Protein 7.6 6.0 - 8.3 g/dL   Albumin 4.7 3.5 - 5.2 g/dL    Assessment and Plan  Ear pressure, right Assessment & Plan: Acute, all symptoms likely secondary to allergic rhinitis causing postnasal drip and eustachian tube dysfunction.  Likely causing pressure in ear from remaining fluid as well as feeling of pressure at opening of eustachian tube. She did just finish the course of prednisone  and did  not see any difference in these symptoms.  Would recommend starting nondrowsy antihistamine at bedtime like Zyrtec as well as adding Flonase 2 sprays per nostril daily to open up eustachian tube and allow it to drain.  If her symptoms are not improving as expected we can consider referral to ENT for ear pressure evaluation versus neck/thyroid  ultrasound to evaluate neck pressure.  Given there is some relationship to swallowing could also consider a swallowing study.   Throat discomfort  Post-nasal drip    No follow-ups on file.   Greig Ring, MD

## 2023-09-20 NOTE — Assessment & Plan Note (Signed)
 Acute, all symptoms likely secondary to allergic rhinitis causing postnasal drip and eustachian tube dysfunction.  Likely causing pressure in ear from remaining fluid as well as feeling of pressure at opening of eustachian tube. She did just finish the course of prednisone  and did not see any difference in these symptoms.  Would recommend starting nondrowsy antihistamine at bedtime like Zyrtec as well as adding Flonase 2 sprays per nostril daily to open up eustachian tube and allow it to drain.  If her symptoms are not improving as expected we can consider referral to ENT for ear pressure evaluation versus neck/thyroid  ultrasound to evaluate neck pressure.  Given there is some relationship to swallowing could also consider a swallowing study.

## 2023-09-21 ENCOUNTER — Encounter: Payer: Self-pay | Admitting: Diagnostic Neuroimaging

## 2023-09-22 ENCOUNTER — Encounter: Payer: Self-pay | Admitting: Family Medicine

## 2023-09-22 DIAGNOSIS — Z23 Encounter for immunization: Secondary | ICD-10-CM

## 2023-09-22 MED ORDER — COVID-19 MRNA VAC-TRIS(PFIZER) 30 MCG/0.3ML IM SUSY: 0.3000 mL | PREFILLED_SYRINGE | Freq: Once | INTRAMUSCULAR | 0 refills | Status: AC

## 2023-09-22 NOTE — Telephone Encounter (Signed)
Ok to send as pended.

## 2023-09-25 ENCOUNTER — Encounter: Payer: Self-pay | Admitting: Family Medicine

## 2023-09-26 MED ORDER — COVID-19 MRNA VAC-TRIS(PFIZER) 30 MCG/0.3ML IM SUSY: 0.3000 mL | PREFILLED_SYRINGE | Freq: Once | INTRAMUSCULAR | 0 refills | Status: AC

## 2023-09-26 NOTE — Telephone Encounter (Signed)
Ok to send as pended.

## 2023-09-27 ENCOUNTER — Ambulatory Visit (INDEPENDENT_AMBULATORY_CARE_PROVIDER_SITE_OTHER): Admitting: Neurology

## 2023-09-27 ENCOUNTER — Encounter: Payer: Self-pay | Admitting: Neurology

## 2023-09-27 DIAGNOSIS — R202 Paresthesia of skin: Secondary | ICD-10-CM | POA: Diagnosis not present

## 2023-09-27 DIAGNOSIS — R200 Anesthesia of skin: Secondary | ICD-10-CM | POA: Diagnosis not present

## 2023-09-27 NOTE — Procedures (Signed)
 Full Name: Nicolette Gieske Gender: Female MRN #: [id number] Date of Birth: [birth year]    Visit Date: 09/27/2023 08:44 Age: 78 Years Referring Physician: Dr. Margaret Height: 5 feet 6 inch History: 78 year old female complains of rapid weight loss since COVID 5 years ago, intermittent cold sensation around her waist, muscle spasm of bilateral lower extremity  Summary of the test: Nerve conduction study: Right sural, superficial peroneal sensory responses were within normal limit.  Right peroneal to EDB, tibial motor responses were normal.  Borderline prolonged peak latency and right median, ulnar sensory response, with her cold limb temperature.  Right median and ulnar transcarpal response showed no significant difference.  Right median, ulnar motor responses were normal  Electromyography: Selected needle examination of right lower extremity muscles, right lumbosacral paraspinal; right upper extremity and right cervical paraspinal muscles were normal.  Conclusion: This is essentially a normal study, there was no electrodiagnostic evidence of large fiber peripheral neuropathy, right lumbar radiculopathy, or right cervical radiculopathy.    ------------------------------- Modena Callander. M.D. Ph.D.   [facility] [address], [address] [place], [place] [address] Tel: [phone] Fax: [phone]  Verbal informed consent was obtained from the patient, patient was informed of potential risk of procedure, including bruising, bleeding, hematoma formation, infection, muscle weakness, muscle pain, numbness, among others.        MNC    Nerve / Sites Muscle Latency Ref. Amplitude Ref. Rel Amp Segments Distance Velocity Ref. Area    ms ms mV mV %  cm m/s m/s mVms  R Median - APB     Wrist APB 4.1 <=4.4 8.9 >=4.0 100 Wrist - APB 7   32.4     Upper arm APB 8.1  8.0  90.8 Upper arm - Wrist 23 58 >=49 28.5  R Ulnar - ADM     Wrist ADM 3.1 <=3.3 11.0 >=6.0 100 Wrist -  ADM 7   36.8     B.Elbow ADM 5.3  10.8  98.4 B.Elbow - Wrist 12 54 >=49 38.1     A.Elbow ADM 7.6  9.5  88.1 A.Elbow - B.Elbow 13 56 >=49 36.9  R Peroneal - EDB     Ankle EDB 5.0 <=6.5 3.5 >=2.0 100 Ankle - EDB 9   11.3     Fib head EDB 11.8  3.1  88.6 Fib head - Ankle 28 41 >=44 10.7     Pop fossa EDB 14.0  3.4  110 Pop fossa - Fib head 9 42 >=44 13.2         Pop fossa - Ankle      R Tibial - AH     Ankle AH 5.4 <=5.8 4.8 >=4.0 100 Ankle - AH 9   9.5     Pop fossa AH 15.6  2.8  57.4 Pop fossa - Ankle 43 42 >=41 7.1             SNC    Nerve / Sites Rec. Site Peak Lat Ref.  Amp Ref. Segments Distance Peak Diff Ref.    ms ms V V  cm ms ms  R Sural - Ankle (Calf)     Calf Ankle 4.0 <=4.4 6 >=6 Calf - Ankle 14    R Superficial peroneal - Ankle     Lat leg Ankle 4.0 <=4.4 6 >=6 Lat leg - Ankle 14    R Median, Ulnar - Transcarpal comparison     Median Palm Wrist 2.3 <=2.2 39 >=  35 Median Palm - Wrist 8       Ulnar Palm Wrist 2.3 <=2.2 12 >=12 Ulnar Palm - Wrist 8          Median Palm - Ulnar Palm  0.0 <=0.4  R Median - Orthodromic (Dig II, Mid palm)     Dig II Wrist 3.4 <=3.4 10 >=10 Dig II - Wrist 13    R Ulnar - Orthodromic, (Dig V, Mid palm)     Dig V Wrist 3.2 <=3.1 8 >=5 Dig V - Wrist 67                 F  Wave    Nerve F Lat Ref.   ms ms  R Tibial - AH 59.8 <=56.0  R Ulnar - ADM 28.3 <=32.0         EMG Summary Table    Spontaneous MUAP Recruitment  Muscle IA Fib PSW Fasc Other Amp Dur. Poly Pattern  R. Tibialis anterior Normal None None None _______ Normal Normal Normal Normal  R. Peroneus longus Normal None None None _______ Normal Normal Normal Normal  R. Gastrocnemius (Medial head) Normal None None None _______ Normal Normal Normal Normal  R. Vastus lateralis Normal None None None _______ Normal Normal Normal Normal  R. Tibialis posterior Normal None None None _______ Normal Normal Normal Normal  R. First dorsal interosseous Normal None None None _______ Normal Normal  Normal Normal  R. Pronator teres Normal None None None _______ Normal Normal Normal Normal  R. Biceps brachii Normal None None None _______ Normal Normal Normal Normal  R. Deltoid Normal None None None _______ Normal Normal Normal Normal  R. Triceps brachii Normal None None None _______ Normal Normal Normal Normal  R. Cervical paraspinals Normal None None None _______ Normal Normal Normal Normal  R. Lumbar paraspinals (low) Normal None None None _______ Normal Normal Normal Normal  R. Lumbar paraspinals (mid) Normal None None None _______ Normal Normal Normal Normal

## 2023-09-27 NOTE — Progress Notes (Signed)
 Chief Complaint  Patient presents with   EMG    RM 4, EMG     ASSESSMENT AND PLAN  Jessica Miles is a 78 y.o. female   Constellation of complaints, including intermittent limb muscle spasm paresthesia, cold wrapping sensation around her waist  Essentially normal neurological examination,  Normal EMG nerve conduction study September 27, 2023,  Unremarkable MRI of neural axis,    DIAGNOSTIC DATA (LABS, IMAGING, TESTING) - I reviewed patient records, labs, notes, testing and imaging myself where available.   MEDICAL HISTORY:  Jessica Miles is a 78 year old female, seen in request by Dr. Margaret for electrodiagnostic study for intermittent limb muscle spasm, cold sensation around her waist,  History is obtained from the patient and review of electronic medical records. I personally reviewed pertinent available imaging films in PACS.   PMHx of  Anxiety Hypothyrodism  She used to be very active, but developed constellation of symptoms since her COVID infection about 5 years ago, episode of confusion, rapid weight loss, decreased appetite, in recent few months, also bothered by her intermittent lower extremity muscle spasm, occasionally involving upper extremity, episode of sudden onset cold wrapping sensation around her waist, she denied gait abnormality, able to continue to walk 4 miles daily, doing yard work, less stamina,  Already had extensive evaluation, MRIs in June 2025, MRI of the brain, cervical, thoracic spine was unremarkable  She has chronic low back pain, recent worsening after she twisted her right lower back doing yard work few months ago, under the care of neurosurgeon, reported MRI of lumbar spine showed degenerative changes I do not have access   Lab in 2025, normal CPK, ferritin, C-reactive protein ESR, liver function, negative hepatitis panel, CMP showed slight elevation of AST, ALT  EMG nerve conduction study September 27, 2023 was essentially normal  PHYSICAL  EXAM:   Vitals:   09/27/23 0821  BP: 128/68  Weight: 102 lb (46.3 kg)  Height: 5' 6 (1.676 m)   Body mass index is 16.46 kg/m.  PHYSICAL EXAMNIATION:  Gen: NAD, conversant, well nourised, well groomed                     Cardiovascular: Regular rate rhythm, no peripheral edema, warm, nontender. Eyes: Conjunctivae clear without exudates or hemorrhage Neck: Supple, no carotid bruits. Pulmonary: Clear to auscultation bilaterally   NEUROLOGICAL EXAM:  MENTAL STATUS: Speech/cognition: Awake, alert, oriented to history taking and casual conversation CRANIAL NERVES: CN II: Visual fields are full to confrontation. Pupils are round equal and briskly reactive to light. CN III, IV, VI: extraocular movement are normal. No ptosis. CN V: Facial sensation is intact to light touch CN VII: Face is symmetric with normal eye closure  CN VIII: Hearing is normal to causal conversation. CN IX, X: Phonation is normal. CN XI: Head turning and shoulder shrug are intact  MOTOR: There is no pronator drift of out-stretched arms. Muscle bulk and tone are normal. Muscle strength is normal.  REFLEXES: Reflexes are 2+ and symmetric at the biceps, triceps, knees, and ankles. Plantar responses are flexor.  SENSORY: Decreased vibratory sensation at left toes, with well-preserved light touch pinprick  COORDINATION: There is no trunk or limb dysmetria noted.  GAIT/STANCE: Posture is normal. Gait is steady   REVIEW OF SYSTEMS:  Full 14 system review of systems performed and notable only for as above All other review of systems were negative.   ALLERGIES: Allergies  Allergen Reactions   Fluticasone Other (See Comments)  Nose bleeds   Mometasone Other (See Comments)    Epistaxis   Fexofenadine Palpitations    HOME MEDICATIONS: Current Outpatient Medications  Medication Sig Dispense Refill   alendronate  (FOSAMAX ) 70 MG tablet Take 1 tablet (70 mg total) by mouth every 7 (seven) days. Take  with a full glass of water on an empty stomach. 4 tablet 11   ALPRAZolam  (XANAX ) 1 MG tablet Take 1 tablet (1 mg total) by mouth at bedtime as needed (muscle spasms). 30 tablet 0   atenolol (TENORMIN) 25 MG tablet Take 25 mg by mouth daily as needed (palpitations/pulse racing).     baclofen (LIORESAL) 10 MG tablet Take 10 mg by mouth daily.     buPROPion  ER (WELLBUTRIN  SR) 100 MG 12 hr tablet Take 100 mg by mouth daily.     busPIRone  (BUSPAR ) 5 MG tablet Take 1 tablet (5 mg total) by mouth 2 (two) times daily as needed (anxiety). 180 tablet 0   Calcium  Carb-Cholecalciferol (CALCIUM  600 + D PO) Take 1 tablet by mouth in the morning. 600 mg /400 mcg     colestipol  (COLESTID ) 1 g tablet Take 1 g by mouth daily.     estradiol  (ESTRACE ) 0.1 MG/GM vaginal cream 0.5 g of cream intravaginally administered daily at bedtime for 2 weeks, then reduce to twice weekly 42.5 g 12   ketoconazole  (NIZORAL ) 2 % cream Apply 1 Application topically 2 (two) times daily as needed for irritation.  Apply between the third and fourth toe twice a day as needed     levothyroxine  (SYNTHROID ) 112 MCG tablet Take 1 tablet (112 mcg total) by mouth daily. 15 tablet 0   Lifitegrast (XIIDRA) 5 % SOLN Place 1 drop into both eyes 2 (two) times daily.     Magnesium  250 MG TABS Take 1 tablet by mouth daily.     Methylcellulose, Laxative, (CITRUCEL) 500 MG TABS Take 6 tablets by mouth daily.     methylPREDNISolone  (MEDROL  DOSEPAK) 4 MG TBPK tablet Take by mouth as directed.     mupirocin  cream (BACTROBAN ) 2 % Apply 1 Application topically 2 (two) times daily as needed.     nitroGLYCERIN  (NITRODUR - DOSED IN MG/24 HR) 0.2 mg/hr patch Place 0.2 mg onto the skin daily as needed.     oxyCODONE (OXY IR/ROXICODONE) 5 MG immediate release tablet Take by mouth.     Probiotic Product (PROBIOTIC PO) Take 1 tablet by mouth daily. Miome     triamcinolone  cream (KENALOG ) 0.5 % Apply 1 Application topically 2 (two) times daily as needed.     No  current facility-administered medications for this visit.    PAST MEDICAL HISTORY: Past Medical History:  Diagnosis Date   Allergy    Anxiety    COVID-19 virus infection    09-23-2019- 9-16 had monoclonal infusion    Depression    History of IBS    HPV (human papilloma virus) infection    Hyperlipidemia    83- not taking statin    IC (interstitial cystitis)    Loss of taste    Neuropathy 2015   surgery for Mortons neuroma in 1985   Osteoarthritis of lower back    Osteopenia    Osteoporosis    Pudendal neuralgia    Raynaud's disease 02/2020   Tachycardia    Thyroid  disease     PAST SURGICAL HISTORY: Past Surgical History:  Procedure Laterality Date   ANAL RECTAL MANOMETRY N/A 05/03/2023   Procedure: MANOMETRY, ANORECTAL;  Surgeon: Wilhelmenia Roers  Mickey., MD;  Location: [facility];  Service: Gastroenterology;  Laterality: N/A;   BIOPSY  01/25/2022   Procedure: BIOPSY;  Surgeon: Wilhelmenia Aloha Mickey., MD;  Location: [facility];  Service: Gastroenterology;;   COLONOSCOPY  2019   hems only    ESOPHAGOGASTRODUODENOSCOPY (EGD) WITH PROPOFOL  N/A 01/25/2022   Procedure: ESOPHAGOGASTRODUODENOSCOPY (EGD) WITH PROPOFOL ;  Surgeon: Wilhelmenia Aloha Mickey., MD;  Location: [facility];  Service: Gastroenterology;  Laterality: N/A;   EUS N/A 01/25/2022   Procedure: UPPER ENDOSCOPIC ULTRASOUND (EUS) LINEAR;  Surgeon: Wilhelmenia Aloha Mickey., MD;  Location: [facility];  Service: Gastroenterology;  Laterality: N/A;   FOOT NEUROMA SURGERY  1985   WISDOM TOOTH EXTRACTION      FAMILY HISTORY: Family History  Problem Relation Age of Onset   Stroke Mother 22   Heart failure Mother    Heart attack Mother    Heart disease Mother    Heart attack Father 79   Heart disease Father    Heart disease Brother    Heart failure Brother        CHF   Polymyalgia rheumatica Brother    Heart disease Maternal Grandmother    Heart attack Maternal Grandfather    Diabetes Son        IDDM    Stroke Paternal Uncle    Thyroid  disease Neg Hx    Stomach cancer Neg Hx    Esophageal cancer Neg Hx    Colon polyps Neg Hx    Colon cancer Neg Hx    Rectal cancer Neg Hx    Inflammatory bowel disease Neg Hx    Liver disease Neg Hx    Pancreatic cancer Neg Hx     SOCIAL HISTORY: Social History   Socioeconomic History   Marital status: Married    Spouse name: Carlin B. Ureste   Number of children: 2   Years of education: MA   Highest education level: Master's degree (e.g., MA, MS, MEng, MEd, MSW, MBA)  Occupational History   Occupation: Retired    Comment: Editor, commissioning  Tobacco Use   Smoking status: Never    Passive exposure: Past   Smokeless tobacco: Never  Vaping Use   Vaping status: Never Used  Substance and Sexual Activity   Alcohol use: Not Currently   Drug use: No   Sexual activity: Not Currently  Other Topics Concern   Not on file  Social History Narrative   12/23/20 Patient lives at [facility] with spouse.   Caffeine Use: 1 cup daily   2 kids- 4 grandchildren- two boys and two girls      Social Drivers of Corporate investment banker Strain: Low Risk  (02/17/2023)   Received from [facility]   Overall Financial Resource Strain (CARDIA)    Difficulty of Paying Living Expenses: Not hard at all  Food Insecurity: No Food Insecurity (08/01/2023)   Received from [facility]   Hunger Vital Sign    Within the past 12 months, you worried that your food would run out before you got the money to buy more.: Never true    Within the past 12 months, the food you bought just didn't last and you didn't have money to get more.: Never true  Transportation Needs: No Transportation Needs (08/01/2023)   Received from [facility] - Transportation    In the past 12 months, has lack of transportation kept you from medical appointments or from getting medications?: No    In the past  12 months, has lack of transportation kept you from meetings, work, or from getting  things needed for daily living?: No  Physical Activity: Sufficiently Active (07/24/2023)   Exercise Vital Sign    Days of Exercise per Week: 7 days    Minutes of Exercise per Session: 60 min  Stress: Stress Concern Present (08/01/2023)   Received from [facility] - Occupational Stress Questionnaire    Do you feel stress - tense, restless, nervous, or anxious, or unable to sleep at night because your mind is troubled all the time - these days?: To some extent  Social Connections: Moderately Isolated (07/24/2023)   Social Connection and Isolation Panel    Frequency of Communication with Friends and Family: More than three times a week    Frequency of Social Gatherings with Friends and Family: Three times a week    Attends Religious Services: Patient declined    Active Member of Clubs or Organizations: No    Attends Banker Meetings: Not on file    Marital Status: Married  Intimate Partner Violence: Not At Risk (02/17/2023)   Received from [facility]   HITS    Over the last 12 months how often did your partner physically hurt you?: Never    Over the last 12 months how often did your partner insult you or talk down to you?: Never    Over the last 12 months how often did your partner threaten you with physical harm?: Never    Over the last 12 months how often did your partner scream or curse at you?: Never      Modena Callander, M.D. Ph.D.  [facility] [address] [place], [place] [address] Ph: [phone] Fax: [phone]  CC:  Avelina Greig BRAVO, MD [address] [place],  [place] [address]  Avelina Greig BRAVO, MD

## 2023-09-29 ENCOUNTER — Ambulatory Visit: Payer: Self-pay | Admitting: Family Medicine

## 2023-09-29 ENCOUNTER — Encounter: Payer: Self-pay | Admitting: Diagnostic Neuroimaging

## 2023-10-10 ENCOUNTER — Telehealth: Payer: Self-pay | Admitting: *Deleted

## 2023-10-10 DIAGNOSIS — E7849 Other hyperlipidemia: Secondary | ICD-10-CM

## 2023-10-10 NOTE — Telephone Encounter (Signed)
 Labs done on 08/07/2023.  Do you want to repeat CMET and Lipid for CPE labs?

## 2023-10-10 NOTE — Telephone Encounter (Signed)
-----   Message from Veva JINNY Ferrari sent at 10/10/2023 11:23 AM EDT ----- Regarding: Lab orders for Wed, 10.15.25 Patient is scheduled for CPX labs, please order future labs, Thanks , Veva

## 2023-10-16 ENCOUNTER — Ambulatory Visit: Admitting: Family Medicine

## 2023-10-16 ENCOUNTER — Ambulatory Visit: Payer: Self-pay

## 2023-10-16 ENCOUNTER — Ambulatory Visit (INDEPENDENT_AMBULATORY_CARE_PROVIDER_SITE_OTHER): Admitting: Physician Assistant

## 2023-10-16 ENCOUNTER — Encounter: Payer: Self-pay | Admitting: Physician Assistant

## 2023-10-16 VITALS — BP 124/60 | HR 81 | Resp 14 | Ht 66.0 in | Wt 102.8 lb

## 2023-10-16 DIAGNOSIS — L989 Disorder of the skin and subcutaneous tissue, unspecified: Secondary | ICD-10-CM | POA: Diagnosis not present

## 2023-10-16 DIAGNOSIS — F411 Generalized anxiety disorder: Secondary | ICD-10-CM | POA: Diagnosis not present

## 2023-10-16 DIAGNOSIS — F331 Major depressive disorder, recurrent, moderate: Secondary | ICD-10-CM

## 2023-10-16 NOTE — Telephone Encounter (Signed)
 FYI Only or Action Required?: FYI only for provider.  Patient was last seen in primary care on 09/20/2023 by Avelina Greig BRAVO, MD.  Called Nurse Triage reporting Foot Pain.  Symptoms began several weeks ago.  Interventions attempted: Other: epsom salt, alcohol.  Symptoms are: right foot lateral side growth/lump about 1/4inch with redness, pain and white head center gradually worsening.  Triage Disposition: See Physician Within 24 Hours  Patient/caregiver understands and will follow disposition?: Yes            Copied from CRM [id number]. Topic: Clinical - Red Word Triage >> Oct 16, 2023  8:02 AM Jessica Miles wrote: Red Word that prompted transfer to Nurse Triage: Patient states there is a growth/bump about 1/4 out of her foot, painful, ongoing for a couple of weeks, red and swollen, and now hard. Thinks this may be infected. Hard to get shoes on, bothers her to walk. Reason for Disposition  [1] Redness of the skin AND [2] no fever  Answer Assessment - Initial Assessment Questions 1. ONSET: When did the pain start?      3 weeks ago.  2. LOCATION: Where is the pain located?      Right foot, lateral side about half way between toes and heel with a protrusion/lump about 1/4 inch out (she states she previously had a fracture in the location)  3. PAIN: How bad is the pain?    (Scale 1-10; or mild, moderate, severe)     She states it is painful to walk. 3-4/10 at rest currently.  4. WORK OR EXERCISE: Has there been any recent work or exercise that involved this part of the body?      No overuse or injury; she states she walks a lot.  5. CAUSE: What do you think is causing the foot pain?     Unsure, she states she thinks it could be a bone problem or infection, possibly a bug infection.  6. OTHER SYMPTOMS: Do you have any other symptoms? (e.g., leg pain, rash, fever, numbness)     Hardened, red lump with white center. Denies fever, rash.  7. PREGNANCY: Is there any  chance you are pregnant? When was your last menstrual period?     N/A.  Protocols used: Foot Pain-A-AH

## 2023-10-16 NOTE — Progress Notes (Signed)
 Established patient visit  Patient: Jessica Miles   DOB: [birth year]   78 y.o. Female  MRN: [id number] Visit Date: 10/16/2023  Today's healthcare provider: Jolynn Spencer, PA-C   Chief Complaint  Patient presents with   Foot Pain    R foot bump, with pain, redness, swelling onset 4 weeks Pt was soaking in epson salt and first aid antiseptic. After has a white spot that has harden.   Subjective     HPI     Foot Pain    Additional comments: R foot bump, with pain, redness, swelling onset 4 weeks Pt was soaking in epson salt and first aid antiseptic. After has a white spot that has harden.      Last edited by Wilfred Hargis RAMAN, CMA on 10/16/2023  1:51 PM.       Discussed the use of AI scribe software for clinical note transcription with the patient, who gave verbal consent to proceed.  History of Present Illness Jessica Miles is a 78 year old female who presents with a painful bump on her leg.  Four weeks ago, she noticed a small bump on her leg, which has since increased in size, become redder, and more painful. The bump is hard with a small white spot. She has been soaking it in Epsom salts and applying a topical solution similar to alcohol, as well as mupirocin  ointment for the past two days. The pain has improved somewhat, but the bump remains bothersome, especially when it presses against her shoe.  She denies diabetes or autoimmune diseases and is not aware of taking any immunosuppressive medications. Her current medications include Synthroid  and Wellbutrin . She has a history of back issues and has used prednisone  in the past. Her spine condition limits her ability to examine the bump closely. The pain has decreased with soaking and treatment, but she is unsure of the effectiveness of her self-treatment.       07/25/2023    8:37 AM 06/07/2023   11:26 AM 03/10/2023    3:25 PM  Depression screen PHQ 2/9  Decreased Interest 2 1 1   Down, Depressed, Hopeless 1 1 3   PHQ - 2 Score 3 2  4   Altered sleeping 1 0 0  Tired, decreased energy 3 2 3   Change in appetite 0 0 1  Feeling bad or failure about yourself  0 0 0  Trouble concentrating 1 0 0  Moving slowly or fidgety/restless 0 0 0  Suicidal thoughts 1 0 0  PHQ-9 Score 9 4 8   Difficult doing work/chores Somewhat difficult Somewhat difficult Very difficult      07/25/2023    8:38 AM 06/07/2023   11:26 AM 03/10/2023    3:26 PM 10/26/2022    9:01 AM  GAD 7 : Generalized Anxiety Score  Nervous, Anxious, on Edge 1 1 3  0  Control/stop worrying 1 1 3  0  Worry too much - different things 1 2 3  0  Trouble relaxing 3 2 2  0  Restless  0 0 0  Easily annoyed or irritable 0 1 1 0  Afraid - awful might happen 1 2 1  0  Total GAD 7 Score  9 13 0  Anxiety Difficulty Somewhat difficult Somewhat difficult Very difficult Not difficult at all    Medications: Outpatient Medications Prior to Visit  Medication Sig   alendronate  (FOSAMAX ) 70 MG tablet Take 1 tablet (70 mg total) by mouth every 7 (seven) days. Take with a full glass of water on an empty stomach.  ALPRAZolam  (XANAX ) 1 MG tablet Take 1 tablet (1 mg total) by mouth at bedtime as needed (muscle spasms).   atenolol (TENORMIN) 25 MG tablet Take 25 mg by mouth daily as needed (palpitations/pulse racing).   baclofen (LIORESAL) 10 MG tablet Take 10 mg by mouth daily.   buPROPion  ER (WELLBUTRIN  SR) 100 MG 12 hr tablet Take 100 mg by mouth daily.   busPIRone  (BUSPAR ) 5 MG tablet Take 1 tablet (5 mg total) by mouth 2 (two) times daily as needed (anxiety).   Calcium  Carb-Cholecalciferol (CALCIUM  600 + D PO) Take 1 tablet by mouth in the morning. 600 mg /400 mcg   colestipol  (COLESTID ) 1 g tablet Take 1 g by mouth daily.   estradiol  (ESTRACE ) 0.1 MG/GM vaginal cream 0.5 g of cream intravaginally administered daily at bedtime for 2 weeks, then reduce to twice weekly   ketoconazole  (NIZORAL ) 2 % cream Apply 1 Application topically 2 (two) times daily as needed for irritation.  Apply  between the third and fourth toe twice a day as needed   levothyroxine  (SYNTHROID ) 112 MCG tablet Take 1 tablet (112 mcg total) by mouth daily.   Lifitegrast (XIIDRA) 5 % SOLN Place 1 drop into both eyes 2 (two) times daily.   Magnesium  250 MG TABS Take 1 tablet by mouth daily.   Methylcellulose, Laxative, (CITRUCEL) 500 MG TABS Take 6 tablets by mouth daily.   methylPREDNISolone  (MEDROL  DOSEPAK) 4 MG TBPK tablet Take by mouth as directed.   mupirocin  cream (BACTROBAN ) 2 % Apply 1 Application topically 2 (two) times daily as needed.   nitroGLYCERIN  (NITRODUR - DOSED IN MG/24 HR) 0.2 mg/hr patch Place 0.2 mg onto the skin daily as needed.   oxyCODONE (OXY IR/ROXICODONE) 5 MG immediate release tablet Take by mouth.   Probiotic Product (PROBIOTIC PO) Take 1 tablet by mouth daily. Miome   triamcinolone  cream (KENALOG ) 0.5 % Apply 1 Application topically 2 (two) times daily as needed.   No facility-administered medications prior to visit.    Review of Systems All negative Except see HPI       Objective    BP 124/60   Pulse 81   Resp 14   Ht 5' 6 (1.676 m)   Wt 102 lb 12.8 oz (46.6 kg)   SpO2 100%   BMI 16.59 kg/m     Physical Exam Constitutional:      General: She is not in acute distress.    Appearance: Normal appearance.  HENT:     Head: Normocephalic.  Pulmonary:     Effort: Pulmonary effort is normal. No respiratory distress.  Skin:    Findings: Lesion (Right foot) present.  Neurological:     Mental Status: She is alert and oriented to person, place, and time. Mental status is at baseline.      No results found for any visits on 10/16/23.      Assessment & Plan Healing skin lesion of foot Lesion initially suggested infection or abscess, now resolving with crust formation. No systemic infection or complicating conditions. - Continue warm Epsom salt soaks twice daily as needed. - Apply mupirocin  to prevent bacterial infection or other antiseptic as needed -  Reassess if bothersome or signs of worsening infection occur. - Follow-up with primary care provider on November 01, 2023, if needed.  Depression/Anxiety disorder Long-standing mental health disorder acknowledged. Anxiety may exacerbate health concerns, but she actively manages it. - Continue using personal tools and strategies to manage anxiety. Continue Wellbutrin  ER 150, BuSpar  5  mg twice daily as needed, alprazolam  at bedtime    No orders of the defined types were placed in this encounter.   No follow-ups on file.   The patient was advised to call back or seek an in-person evaluation if the symptoms worsen or if the condition fails to improve as anticipated.  I discussed the assessment and treatment plan with the patient. The patient was provided an opportunity to ask questions and all were answered. The patient agreed with the plan and demonstrated an understanding of the instructions.  I, Raoul Ciano, PA-C have reviewed all documentation for this visit. The documentation on 10/16/2023  for the exam, diagnosis, procedures, and orders are all accurate and complete.  Jolynn Spencer, [facility], MMS [facility] [phone] (phone) [phone] (fax)  [facility]

## 2023-10-17 NOTE — Telephone Encounter (Signed)
 Noted. Thanks you for seeing.

## 2023-10-18 NOTE — Progress Notes (Addendum)
 Office Visit Note  Patient: Jessica Miles             Date of Birth: [birth year]           MRN: [id number]             PCP: Avelina Greig BRAVO, MD Referring: Avelina Greig BRAVO, MD Visit Date: 11/01/2023 Occupation: Data Unavailable  Subjective:  Results (Patient states she is having a tingling sensation in her lower legs. Patient states it feels like bugs crawling in her skin. Patient states she is also having cramping in her lower extremities. )   History of Present Illness: Jessica Miles is a 78 y.o. female is here for new patient follow-up. Patient was originally seen as a new patient 08/31/23 for muscle cramps and elevated CK. Repeat CK returned within normal limits. There was no concern for inflammatory myopathy at that time and discussed with patient that her muscle cramps were likely unrelated to one-time mildly elevated CK. Given patient had EMG already scheduled, follow-up appointment in October was scheduled in the event that study demonstrated abnormal findings. EMG did return normal, and patient instructed she could cancel follow-up if she wished. Patient wished to keep follow-up appointment at that time.  Patient presents today due to concerns regarding MRI results. Outside MRI results showed acute/subacute compression fracture of L2 w/ a chronic compression fracture of L5. Repeat DEXA scan performed that demonstrated osteoporosis. Patient had discussed options for treatment with her PCP, but wishes to discuss these further. Patient states that she was not recently taking her Fosmax, but has taken it in the past. Prior documentation from other providers does note GI upset.   She admits to some continued pain in her low back where her compression fracture was found. She denies any new back pain. She does admit to some continued muscle cramping in her b/l legs at random. She also admits to neuropathy like symptoms in her b/l feet.     Activities of Daily Living:  Patient reports morning  stiffness for 5-10 minutes.   Patient Denies nocturnal pain.  Difficulty dressing/grooming: Denies Difficulty climbing stairs: Denies Difficulty getting out of chair: Denies Difficulty using hands for taps, buttons, cutlery, and/or writing: Denies  Review of Systems  Constitutional:  Positive for fatigue.  HENT:  Positive for mouth dryness. Negative for mouth sores.   Eyes:  Positive for dryness.  Respiratory:  Negative for shortness of breath.   Cardiovascular:  Negative for chest pain and palpitations.  Gastrointestinal:  Negative for blood in stool, constipation and diarrhea.  Endocrine: Negative for increased urination.  Genitourinary:  Negative for involuntary urination.  Musculoskeletal:  Positive for myalgias, muscle weakness, morning stiffness and myalgias. Negative for joint pain, gait problem, joint pain, joint swelling and muscle tenderness.  Skin:  Positive for color change and rash. Negative for hair loss and sensitivity to sunlight.  Allergic/Immunologic: Negative for susceptible to infections.  Neurological:  Negative for dizziness and headaches.  Hematological:  Negative for swollen glands.  Psychiatric/Behavioral:  Positive for depressed mood. Negative for sleep disturbance. The patient is nervous/anxious.     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   Right upper quadrant abdominal mass 01/26/2023   Panic attacks 10/26/2022   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   Severely underweight adult 09/07/2021   Acute diarrhea 04/27/2021   Transient disorientation 10/20/2020   Memory change 08/05/2020    Family history of early CAD 02/27/2020   Tremor 02/27/2020   Aortic atherosclerosis 12/19/2019   Urinary urgency 05/21/2019   Bladder irritation 05/21/2019   External hemorrhoid 05/21/2019   Raynaud's syndrome 02/09/2019   Osteoarthritis of thumb, 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   Acute left-sided low back pain without sciatica 12/19/2017   SVT (supraventricular tachycardia) 08/31/2017   Peripheral neuropathy 08/31/2017   Atopic dermatitis 03/13/2015   IBS (irritable bowel syndrome) 03/18/2014   Osteoporosis 03/18/2014   Major depressive disorder, recurrent episode, moderate (HCC) 03/18/2014   Hypothyroidism 12/14/2013    Past Medical History:  Diagnosis Date   Allergy    Anxiety    Arthritis    Cataract    Compression fracture of L2 (HCC)    COVID-19 virus infection    09-23-2019- 9-16 had monoclonal infusion    Depression    History of IBS    HPV (human papilloma virus) infection    Hyperlipidemia    82- not taking statin    IC (interstitial cystitis)    Loss of taste    Neuropathy 2015   surgery for Mortons neuroma in 1985   Osteoarthritis of lower back    Osteopenia    Osteoporosis    Pudendal neuralgia    Raynaud's disease 02/2020   Tachycardia    Thyroid  disease     Family History  Problem Relation Age of Onset   Stroke Mother 27   Heart failure Mother    Heart attack Mother    Heart disease Mother    Anxiety disorder Mother    Heart attack Father 66   Heart disease Father    Heart disease Brother    Heart failure Brother        CHF   Polymyalgia rheumatica Brother    Heart disease Maternal Grandmother    Hearing loss Maternal Grandmother    Heart attack Maternal Grandfather    Heart disease Maternal Grandfather    Diabetes Son        IDDM   ADD / ADHD Son    Stroke Paternal Uncle    Thyroid  disease Neg Hx    Stomach cancer Neg Hx    Esophageal cancer Neg Hx    Colon polyps Neg Hx    Colon cancer Neg Hx    Rectal cancer Neg Hx    Inflammatory bowel disease  Neg Hx    Liver disease Neg Hx    Pancreatic cancer Neg Hx    Past Surgical History:  Procedure Laterality Date   ANAL RECTAL MANOMETRY N/A 05/03/2023   Procedure: MANOMETRY, ANORECTAL;  Surgeon: Wilhelmenia Aloha Raddle., MD;  Location: [facility];  Service: Gastroenterology;  Laterality: N/A;   BIOPSY  01/25/2022   Procedure: BIOPSY;  Surgeon: Wilhelmenia Aloha Raddle., MD;  Location: [facility];  Service: Gastroenterology;;   COLONOSCOPY  2019   hems only    ESOPHAGOGASTRODUODENOSCOPY (EGD) WITH PROPOFOL  N/A 01/25/2022   Procedure: ESOPHAGOGASTRODUODENOSCOPY (EGD) WITH PROPOFOL ;  Surgeon: Wilhelmenia Aloha Raddle., MD;  Location: [facility];  Service: Gastroenterology;  Laterality: N/A;   EUS N/A 01/25/2022   Procedure: UPPER ENDOSCOPIC ULTRASOUND (EUS) LINEAR;  Surgeon: Wilhelmenia Aloha Raddle., MD;  Location: [facility];  Service: Gastroenterology;  Laterality: N/A;   FOOT NEUROMA SURGERY  1985   WISDOM TOOTH EXTRACTION     Social History   Tobacco Use   Smoking status: Never    Passive exposure: Past   Smokeless tobacco: Never  Vaping Use   Vaping status: Never Used  Substance Use Topics   Alcohol use: Not Currently   Drug use: No   Social History   Social History Narrative   12/23/20 Patient lives at [facility] with spouse.   Caffeine Use: 1 cup daily   2 kids- 4 grandchildren- two boys and two girls        Immunization History  Administered Date(s) Administered    sv, Bivalent, Protein Subunit Rsvpref,pf [place]) 03/04/2022   DT (Pediatric) 01/11/2008   Fluad Quad(high Dose 65+) 10/02/2018, 10/10/2019, 10/21/2021   INFLUENZA, HIGH DOSE SEASONAL PF 10/21/2014, 10/12/2017, 10/11/2020, 10/11/2022, 10/12/2022, 10/17/2023   Influenza Split 10/25/2006   Influenza, Seasonal, Injecte, Preservative Fre 10/10/2015, 09/29/2016   Influenza-Unspecified 10/21/2011, 10/10/2013   Moderna Covid-19 Fall Seasonal Vaccine 33yrs & older 10/11/2021, 03/26/2022, 09/21/2022,  04/09/2023, 10/05/2023   Moderna Covid-19 Vaccine Bivalent Booster 41yrs & up 09/27/2020, 05/26/2021   Moderna SARS-COV2 Booster Vaccination 09/27/2020   Moderna Sars-Covid-2 Vaccination 01/24/2019, 02/21/2019, 12/23/2019, 05/16/2020, 10/11/2021, 03/26/2022, 09/28/2022   PFIZER(Purple Top)SARS-COV-2 Vaccination 01/21/2019, 02/21/2019, 12/23/2019, 05/16/2020   Pneumococcal Conjugate-13 10/10/2013, 10/21/2013   Pneumococcal Polysaccharide-23 10/25/2006   Pneumococcal-Unspecified 01/11/2007   Td 08/12/2005   Zoster Recombinant(Shingrix ) 08/17/2019, 12/25/2019   Zoster, Live 01/11/2008, 09/30/2008     Objective: Vital Signs: BP 134/73 (BP Location: Right Arm, Patient Position: Sitting, Cuff Size: Small)   Pulse 77   Temp 98.2 F (36.8 C)   Resp 12   Ht 5' 6.5 (1.689 m)   Wt 105 lb 6.4 oz (47.8 kg)   BMI 16.76 kg/m    Physical Exam   Musculoskeletal Exam:   CDAI Exam: CDAI Score: -- Patient Global: --; Provider Global: -- Swollen: --; Tender: -- Joint Exam 11/01/2023   No joint exam has been documented for this visit   There is currently no information documented on the homunculus. Go to the Rheumatology activity and complete the homunculus joint exam.  Investigation: No additional findings.  Imaging: DG Foot Complete Right Result Date: 11/03/2023 Please see detailed radiograph report in office note.   Recent Labs: Lab Results  Component Value Date   WBC 5.6 07/25/2023   HGB 12.8 07/25/2023   PLT 235.0 07/25/2023   NA 134 (L) 10/25/2023   K 4.8 10/25/2023   CL 96 10/25/2023   CO2 31 10/25/2023   GLUCOSE 107 (H) 10/25/2023   BUN 19 10/25/2023   CREATININE 1.09 10/25/2023   BILITOT 0.6 10/25/2023   ALKPHOS 103 10/25/2023   AST 24 10/25/2023   ALT 21 10/25/2023   PROT 7.1 10/25/2023   ALBUMIN 4.4 10/25/2023   CALCIUM  9.9 11/01/2023   DXA (11/03/23): Left hip: -2.9, Total hip: -1.8, Lumbar -3.2  Speciality Comments: No specialty comments  available.  Procedures:  No procedures performed Allergies: Fluticasone, Mometasone, and Fexofenadine   Assessment / Plan:     Visit Diagnoses:  Age-related osteoporosis with current pathological fracture, initial encounter  Patient with hx of Osteoporosis, now with acute vertebral compression fracture L2 and chronic vertebral compression fracture of L5. Per documentation, patient with hx of issues tolerating Fosamax  in the past. Given acute compression fracture and degree of Osteoporosis, patient qualifies for more aggressive treatment. Will start Forteo.   Counseled patient on purpose, proper use, storage, and adverse effects of Forteo. Discussed  the Forteo is PTH peptide agonist which results in stimulation of osteoblast and bone mass. Reviewed that Forteo treatment course is for 2 years. Discussed that pen is stable for 28 days after first use and must be stored in fridge after each dose. Counseled patient that Forteo is a medication that must be injected once daily and that prescription for pen needles will be sent with Forteo prescription.  Advised patient to continue taking calcium  1200 mg daily and vitamin D  supplement.  Reviewed the most common adverse effects of Forteo including orthostatic hypotension, GI upset, injection site reaction, and joint pain/arthralgia.  Discussed injection site reaction management and injection site locations. Discussed alternating injection site.  Forteo prescription pending baseline labs and pharmacy benefits investigation.   All questions encouraged and answered.  Will obtain baseline PTH, intact and calcium , TSH + free T4, Protein Electrophoresis, (serum), Phosphorus  Muscle cramps Elevated CK, resolved  Patient w/ hx of muscle cramping w/ occasional joint cramping. At this time, patient does not have any features of inflammatory myositis, given only mildly elevated CK (which has since resolved without intervention) and normal EMG. No suspicion for  inflammatory etiology of her symptoms. Will repeat CK at next appointment to ensure it remains stable.   Orders: Orders Placed This Encounter  Procedures   PTH, intact and calcium    TSH + free T4   Protein Electrophoresis, (serum)   Phosphorus   T4, free   No orders of the defined types were placed in this encounter.   I personally spent a total of 30 minutes in the care of the patient today including preparing to see the patient, getting/reviewing separately obtained history, performing a medically appropriate exam/evaluation, counseling and educating, placing orders, documenting clinical information in the EHR, and independently interpreting results.   Follow-Up Instructions: Return in about 3 months (around 02/01/2024).   Asberry Claw, DO

## 2023-10-19 ENCOUNTER — Ambulatory Visit: Attending: Obstetrics and Gynecology

## 2023-10-19 DIAGNOSIS — R102 Pelvic and perineal pain unspecified side: Secondary | ICD-10-CM | POA: Insufficient documentation

## 2023-10-19 DIAGNOSIS — M5459 Other low back pain: Secondary | ICD-10-CM | POA: Insufficient documentation

## 2023-10-19 DIAGNOSIS — M62838 Other muscle spasm: Secondary | ICD-10-CM | POA: Insufficient documentation

## 2023-10-19 DIAGNOSIS — M25552 Pain in left hip: Secondary | ICD-10-CM | POA: Insufficient documentation

## 2023-10-19 DIAGNOSIS — R279 Unspecified lack of coordination: Secondary | ICD-10-CM | POA: Insufficient documentation

## 2023-10-19 DIAGNOSIS — R293 Abnormal posture: Secondary | ICD-10-CM | POA: Insufficient documentation

## 2023-10-19 DIAGNOSIS — M6281 Muscle weakness (generalized): Secondary | ICD-10-CM | POA: Insufficient documentation

## 2023-10-19 NOTE — Therapy (Signed)
 OUTPATIENT PHYSICAL THERAPY FEMALE PELVIC TREATMENT   Patient Name: Jessica Miles MRN: [id number] DOB:Jun 05, 1945, 78 y.o., female Today's Date: 10/19/2023  END OF SESSION:  PT End of Session - 10/19/23 1121     Visit Number 32    Date for Recertification  10/23/23    Authorization Type Medicare - needs Kx at 15    Progress Note Due on Visit 40    PT Start Time 1100    PT Stop Time 1132    PT Time Calculation (min) 32 min    Activity Tolerance Patient tolerated treatment well    Behavior During Therapy [facility] for tasks assessed/performed              Past Medical History:  Diagnosis Date   Allergy    Anxiety    COVID-19 virus infection    09-23-2019- 9-16 had monoclonal infusion    Depression    History of IBS    HPV (human papilloma virus) infection    Hyperlipidemia    83- not taking statin    IC (interstitial cystitis)    Loss of taste    Neuropathy 2015   surgery for Mortons neuroma in 1985   Osteoarthritis of lower back    Osteopenia    Osteoporosis    Pudendal neuralgia    Raynaud's disease 02/2020   Tachycardia    Thyroid  disease    Past Surgical History:  Procedure Laterality Date   ANAL RECTAL MANOMETRY N/A 05/03/2023   Procedure: CALA FERNS;  Surgeon: Wilhelmenia Aloha Raddle., MD;  Location: [facility];  Service: Gastroenterology;  Laterality: N/A;   BIOPSY  01/25/2022   Procedure: BIOPSY;  Surgeon: Wilhelmenia Aloha Raddle., MD;  Location: [facility];  Service: Gastroenterology;;   COLONOSCOPY  2019   hems only    ESOPHAGOGASTRODUODENOSCOPY (EGD) WITH PROPOFOL  N/A 01/25/2022   Procedure: ESOPHAGOGASTRODUODENOSCOPY (EGD) WITH PROPOFOL ;  Surgeon: Wilhelmenia Aloha Raddle., MD;  Location: [facility];  Service: Gastroenterology;  Laterality: N/A;   EUS N/A 01/25/2022   Procedure: UPPER ENDOSCOPIC ULTRASOUND (EUS) LINEAR;  Surgeon: Wilhelmenia Aloha Raddle., MD;  Location: [facility];  Service: Gastroenterology;  Laterality: N/A;   FOOT NEUROMA  SURGERY  1985   WISDOM TOOTH EXTRACTION     Patient Active Problem List   Diagnosis Date Noted   Ear pressure, right 09/20/2023   Throat discomfort 09/20/2023   Post-nasal drip 09/20/2023   Numbness and tingling 08/01/2023   Weight loss, unintentional 07/30/2023   Lower abdominal pain 07/30/2023   Abnormality of pancreatic duct 07/30/2023   Change in bowel habits 07/30/2023   Elevated CK 07/30/2023   Incontinence of feces 07/30/2023   Muscle cramping 07/25/2023   Abnormal laboratory test result 06/09/2023   Cardiac arrhythmia 06/09/2023   Encounter for medication refill 06/09/2023   Finding of above normal blood pressure 06/09/2023   Iatrogenic thyrotoxicosis 06/09/2023   Menopausal symptom 06/09/2023   Muscle weakness of extremity 06/09/2023   Need for vaccination 06/09/2023   Neuroma of foot 06/09/2023   Nuclear senile cataract 06/09/2023   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   Disorder of skin 06/09/2023   Encounter for routine chest x-ray 06/09/2023   Labial irritation 06/07/2023   Vertigo 03/10/2023   Right upper quadrant abdominal mass 01/26/2023   Panic attacks 10/26/2022   Allergic reaction to insect bite 06/23/2022   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   Severely underweight adult 09/07/2021   Acute diarrhea 04/27/2021   Inflamed sebaceous cyst 02/11/2021   Transient disorientation 10/20/2020  Memory change 08/05/2020   Family history of early CAD 02/27/2020   Tremor 02/27/2020   Aortic atherosclerosis 12/19/2019   Urinary urgency 05/21/2019   Bladder irritation 05/21/2019   External hemorrhoid 05/21/2019   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   Raynaud's syndrome 02/09/2019   Osteoarthritis of thumb, 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   Fatigue 12/19/2017   Acute left-sided low back pain without sciatica 12/19/2017   SVT (supraventricular tachycardia) 08/31/2017   Peripheral neuropathy 08/31/2017   Elbow pain 10/21/2015   Dry eyes 09/21/2015   Atopic dermatitis  03/13/2015   IBS (irritable bowel syndrome) 03/18/2014   Osteoporosis 03/18/2014   Major depressive disorder, recurrent episode, moderate (HCC) 03/18/2014   Hypothyroidism 12/14/2013    PCP: Avelina Greig BRAVO, MD  REFERRING PROVIDER: Darol Norris, MD  REFERRING DIAG: M79.18 (ICD-10-CM) - Myalgia, other site G58.8 (ICD-10-CM) - Other specified mononeuropathies  THERAPY DIAG:  Other muscle spasm  Muscle weakness (generalized)  Pain in left hip  Other low back pain  Pelvic pain  Unspecified lack of coordination  Abnormal posture  Rationale for Evaluation and Treatment: Rehabilitation  ONSET DATE: 3 years   SUBJECTIVE:                                                                                                                                                                                           SUBJECTIVE STATEMENT: Pt doing ver ypoorly. 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 She has been in PT for low back pain exacerbation for 5 weeks. She had MRI yesterday and report this morning and ortho PT said she could not be seen with current medical findings in MRI. Most notable is acute/subacute compression at L2. She sees spine specialist tomorrow.   PAIN: 10/19/23 Are you having pain? Yes NPRS scale: 2-3/10 without movement; 7/10 with movement Pain location: Lt hip and perineum/vulva; lower abdomen discomfort  Pain type: soreness Pain description: intermittent   Aggravating factors: sitting Relieving factors: lying flat, sitting on toilet  PRECAUTIONS: Other: large mass in Rt lower quadrant that has not been treated by medical team  RED FLAGS: None   WEIGHT BEARING RESTRICTIONS: No  FALLS:  Has patient fallen in last 6 months? No  LIVING ENVIRONMENT: Lives with: lives with their family Lives in: House/apartment  OCCUPATION: retired Runner, broadcasting/film/video  PLOF: Independent  PATIENT GOALS: decrease pain  PERTINENT HISTORY:   Interstitial cystitis   Updated 04/13/23:  BOWEL MOVEMENT: Pain with bowel movement: No Type of bowel movement:Frequency up to 5x/hour anywhere from solid to diarrhea; she will go 1-2 hours after eating on average and Strain No Fully empty rectum: No Leakage:  Yes: seed size stool can come out without sensation when walking   Pads: No Fiber supplement: No  URINATION: Pain with urination: No Fully empty bladder: Yes: - Stream: Weak Urgency: No - but not drinking much and goes every time she walks by the bathroom  Frequency: as often as she can - very small amounts  Leakage: none Pads: No  INTERCOURSE: NA  PREGNANCY: Vaginal deliveries 2 Tearing No C-section deliveries 0 Currently pregnant No  PROLAPSE: None   OBJECTIVE:  Note: Objective measures were completed at Evaluation unless otherwise noted. 10/19/23: Internal Pelvic Floor significant superficial tenderness with report of burning, pinching and stinging; muscle spasm and aching in Lt deep pelvic floor muscle   Patient confirms identification and approves PT to assess internal pelvic floor and treatment Yes   PELVIC MMT:    MMT 07/31/23  Vaginal 3/5, 7 second hold, 4 repeat contractions  Rectal 2/5, 4 seconds  (Blank rows = not tested)        TONE: Very low tone overall; some vaginal stenosis that feels worse on the Lt side compared to Rt; Lt obturator muscle  PROLAPSE: Not tested   Single leg stance:  Rt: WNL  Lt: WNL  Palpation: tenderness in LT adductors/mediual hamstirngs with rigger points present   07/31/23 LOWER EXTREMITY MMT:  MMT Right eval Left eval  Hip abduction 5/5 5/5  Hip adduction 4+/5 4+/5  Hip internal rotation 5/5 5/5  Hip external rotation 5/5 4/5  Hip extension 4-/5 4-/5                  Internal Pelvic Floor significant superficial tenderness with report of burning, pinching and stinging; muscle spasm and aching in Lt deep pelvic floor muscle   Patient confirms  identification and approves PT to assess internal pelvic floor and treatment Yes   PELVIC MMT:    MMT 07/31/23  Vaginal 3/5, 7 second hold, 4 repeat contractions  Rectal 2/5, 4 seconds  (Blank rows = not tested)        TONE: Very low tone overall; some vaginal stenosis that feels worse on the Lt side compared to Rt; Lt obturator muscle  PROLAPSE: Not tested   Single leg stance:  Rt: WNL  Lt: WNL  Palpation: tenderness in LT adductors/mediual hamstirngs with rigger points present   05/09/23: Grade 1 uterine ligament laxity Significant tenderness with palpation of cervix Significant improvement with paradoxical contraction - not present anymore with lift when squeezing and appropriate lengthening with bearing down    04/25/23: Grade 1 uterine ligament laxity Significant tenderness with palpation of cervix Paradoxical pelvic floor muscle contraction with attempting to bear down to look for anterior/posterior vaginal wall laxity (could be fear of releasing gas/urine)  04/13/23:               External Perineal Exam dryness, tenderness, labial fusion bil                             Internal Pelvic Floor Pt has increased bil pelvic floor muscle tenderness and tightness; bladder restriction and reproduction of urgency with palpation; reproduction of lower abdominal pain with palpation of tight anterior Rt pelvic floor muscles  Patient confirms identification and approves PT to assess internal pelvic floor and treatment Yes   PELVIC MMT:    MMT 04/13/23  Vaginal 2/5, 5 second hold, 5 repeat contractions  (Blank rows = not tested)  TONE: High Bil  01/26/23:               External Perineal Exam dryness, tenderness, labial fusion bil                             Internal Pelvic Floor significant superficial tenderness with report of burning, pinching and stinging; muscle spasm and aching in Lt deep pelvic floor muscle   Patient confirms identification and approves PT to  assess internal pelvic floor and treatment Yes   PELVIC MMT:    MMT eval  Vaginal 1/5, 4 second hold, 6 repeat contractions  (Blank rows = not tested)        TONE: High Lt side  PROLAPSE: Not tested   01/24/23: 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, swelling of quadratus femoris muscle, tendinopathic and frayed hamstring origin Scheduled for anal manometry in April 2025   COGNITION: Overall cognitive status: Within functional limits for tasks assessed     SENSATION: Light touch: Appears intact Proprioception: Appears intact   FUNCTIONAL/SPECIAL TESTS:  Single leg stance:  Rt: more stable, some Lt drop - compensated trendelenburg  Lt: less stable, more Rt drop - compensated trendelenburg Squat: Lt weight shift, Rt valgus knee collapse FAIR: (+) Lt hip Hamstring length: significant decrease with pain on Lt, -75 degrees Curl-up test: breath holding, but no distortion, 2 finger width diastasis in upper abdominals Bed mobility: breath holding  Palpable transversus abdominus contraction bil, but weak Tenderness at Lt ischial tuberosity and hamstrings/Lt posterolateral hip mm  GAIT: Comments: WNL  POSTURE: forward head, decreased lumbar lordosis, posterior pelvic tilt, and Lt lower thoracic/upper lumbar curvature, elevated Lt iliac crest  PELVIC ALIGNMENT: Lt posterior rotation, Lt sacral facing rotation   LUMBARAROM/PROM:  A/PROM A/PROM  Eval (% available)  Flexion 75, pain on LT  Extension 75  Right lateral flexion 100  Left lateral flexion 100, pain in back and Lt pelvis  Right rotation 75  Left rotation 50, feels good on back   (Blank rows = not tested)   LOWER EXTREMITY MMT:  MMT Right eval Left eval  Hip abduction 5/5 5/5  Hip adduction    Hip internal rotation 3/5 4-/5  Hip external rotation 3/5 4/5   PALPATION:   General: Large mass located in Rt abdomen lateral to umbilicus,  non-tender, but with pressure it caused discomfort in low back - patient had not noticed before and reports no doctor has mentioned to her before    TODAY'S TREATMENT:                                                                                                                              DATE:  10/19/23 DISCHARGE Therapeutic activities: Discussed about discharge being best plan of action currently so she can focus on recovery from low back injury   08/15/23  Therapeutic activities: Education on consistency with her medications Impact of anxiety on her bowel movements - with muscle relaxants and anti-anxiety medication she is seeing large increase in motility Her current level of bowel movements and their consistency indicates significant back up Pt education on location of marks she made for pain on posterior hip/thigh most consistent with hamstring issue   08/08/23 Therapeutic activities: Reviewed different pain and what the pudendal nerve block should have done in order to help reassure her that she is seeing some progress We reviewed the same pain concepts and anatomy for bladder instillation to reassure her that it appears that procedure was helpful We discussed POC and what PT can offer her at this point - she is going to think about benefit from sessions and consider discharging for now but does not want to cancel appointments at this time until she sees MD tomorrow Reviewed importance of down training and relaxation techniques   PATIENT EDUCATION:  Education details: See above Person educated: Patient Education method: Explanation, Demonstration, Tactile cues, Verbal cues, and Handouts Education comprehension: verbalized understanding  HOME EXERCISE PROGRAM: [email or web address]  ASSESSMENT:  CLINICAL IMPRESSION: Pt just diagnosed with L2 compression fracture 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 She is having severe pain and loss of function. She is currently wearing a  back brace. Her lumbar A/ROM is severely limited and she has trouble sitting, standing, and ambulating. Due to more pressing concerns with lumbar injury, we agree to discharge pelvic floor physical therapy at this time. When patient has come up with plan at spine specialist tomorrow and goes through treatment, she will return with new referral for bowel dysfunction and biofeedback at that time. She was encouraged to call with any questions or concerns.   OBJECTIVE IMPAIRMENTS: decreased activity tolerance, decreased coordination, decreased endurance, decreased mobility, decreased strength, increased fascial restrictions, increased muscle spasms, impaired tone, postural dysfunction, and pain.   ACTIVITY LIMITATIONS: lifting, bending, sitting, squatting, and bed mobility  PARTICIPATION LIMITATIONS: cleaning, driving, and community activity  PERSONAL FACTORS: 1 comorbidity: medical history are also affecting patient's functional outcome.   REHAB POTENTIAL: Good  CLINICAL DECISION MAKING: Evolving/moderate complexity  EVALUATION COMPLEXITY: Moderate   GOALS: Goals reviewed with patient? Yes  SHORT TERM GOALS: Updated 10/19/23  Pt will be independent with HEP.   Baseline: Goal status: MET 04/13/23  2.  Pt will be able to teach back and utilize urge suppression technique in order to help reduce number of trips to the bathroom.    Baseline:  Goal status: MET 05/09/23  3.  Pt will begin use of pelvic floor muscle wand on regular basis (2-3x/week) in order to help reduce pelvic floor muscle tension and pain. Baseline: has not purchased at of now Goal status: DISCHARGED 07/31/23  4.  Pt will be independent with diaphragmatic breathing and down training activities in order to improve pelvic floor relaxation.  Baseline:  Goal status: MET 05/09/23  5.  Pt will report pelvic pain no greater than 4/10. Baseline:  Goal status: MET 05/09/23   LONG TERM GOALS: Updated 10/19/23  Pt will be  independent with advanced HEP.   Baseline:  Goal status: DISCHARGED 10/19/23  2.  Pt will be able to go 2-3 hours in between voids without urgency or incontinence in order to improve QOL and perform all functional activities with less difficulty.   Baseline: not currently keeping track - she states that she feels too miserable to work on recommending voiding schedule Goal status:  DISCHARGED 10/19/23  3.  Pt will report pelvic pain no greater than 2/10.  Baseline: still getting up to 4-5/10 Goal status: DISCHARGED 10/19/23  4.  Pt will be able to sit for >30 minutes without any increased pelvic pain.  Baseline: She has pain only up to 1/10 if she is sitting with her specialized pillow - but has to use pillow Goal status: discharged 10/19/23  5.  Pt will be able to stand on single leg without pelvic drop and squat without weight shift in order to demonstrate improved functional core and hip strength to more appropriately stabilize pelvis.  Baseline:  Goal status: MET 07/31/23  6.  Pt will demonstrate increase in all impaired hip strength by 1 muscle grades in order to demonstrate improved lumbopelvic support and increase functional ability.   Baseline: many improved; bil extension 4-/5 Goal status: DISCHARGED 10/19/23  PLAN:  PT FREQUENCY: -  PT DURATION: -  PLANNED INTERVENTIONS: -  PLAN FOR NEXT SESSION: discharge   PHYSICAL THERAPY DISCHARGE SUMMARY  Visits from Start of Care: 32  Current functional level related to goals / functional outcomes: Not independent   Remaining deficits: See above   Education / Equipment: HEP   Patient agrees to discharge. Patient goals were partially met. Patient is being discharged due to a change in medical status.   Josette Mares, PT, DPT10/09/2509:39 AM

## 2023-10-20 ENCOUNTER — Encounter: Payer: Self-pay | Admitting: Orthopedic Surgery

## 2023-10-20 DIAGNOSIS — M5416 Radiculopathy, lumbar region: Secondary | ICD-10-CM

## 2023-10-23 ENCOUNTER — Other Ambulatory Visit: Payer: Self-pay | Admitting: Orthopedic Surgery

## 2023-10-23 DIAGNOSIS — M816 Localized osteoporosis [Lequesne]: Secondary | ICD-10-CM

## 2023-10-24 ENCOUNTER — Encounter

## 2023-10-25 ENCOUNTER — Ambulatory Visit

## 2023-10-25 ENCOUNTER — Ambulatory Visit: Payer: Self-pay | Admitting: Family Medicine

## 2023-10-25 ENCOUNTER — Encounter: Payer: Self-pay | Admitting: Family Medicine

## 2023-10-25 ENCOUNTER — Other Ambulatory Visit (INDEPENDENT_AMBULATORY_CARE_PROVIDER_SITE_OTHER): Payer: Medicare Other

## 2023-10-25 ENCOUNTER — Ambulatory Visit (INDEPENDENT_AMBULATORY_CARE_PROVIDER_SITE_OTHER): Admitting: Podiatry

## 2023-10-25 DIAGNOSIS — D239 Other benign neoplasm of skin, unspecified: Secondary | ICD-10-CM | POA: Diagnosis not present

## 2023-10-25 DIAGNOSIS — M7741 Metatarsalgia, right foot: Secondary | ICD-10-CM | POA: Diagnosis not present

## 2023-10-25 DIAGNOSIS — E7849 Other hyperlipidemia: Secondary | ICD-10-CM

## 2023-10-25 LAB — COMPREHENSIVE METABOLIC PANEL WITH GFR
ALT: 21 U/L (ref 0–35)
AST: 24 U/L (ref 0–37)
Albumin: 4.4 g/dL (ref 3.5–5.2)
Alkaline Phosphatase: 103 U/L (ref 39–117)
BUN: 19 mg/dL (ref 6–23)
CO2: 31 meq/L (ref 19–32)
Calcium: 9.8 mg/dL (ref 8.4–10.5)
Chloride: 96 meq/L (ref 96–112)
Creatinine, Ser: 1.09 mg/dL (ref 0.40–1.20)
GFR: 48.24 mL/min — ABNORMAL LOW (ref >60.00)
Glucose, Bld: 107 mg/dL — ABNORMAL HIGH (ref 70–99)
Potassium: 4.8 meq/L (ref 3.5–5.1)
Sodium: 134 meq/L — ABNORMAL LOW (ref 135–145)
Total Bilirubin: 0.6 mg/dL (ref 0.2–1.2)
Total Protein: 7.1 g/dL (ref 6.0–8.3)

## 2023-10-25 LAB — LIPID PANEL
Cholesterol: 226 mg/dL — ABNORMAL HIGH (ref 0–200)
HDL: 97.9 mg/dL (ref >39.00)
LDL Cholesterol: 117 mg/dL — ABNORMAL HIGH (ref 0–99)
NonHDL: 128.12
Total CHOL/HDL Ratio: 2
Triglycerides: 58 mg/dL (ref 0.0–149.0)
VLDL: 11.6 mg/dL (ref 0.0–40.0)

## 2023-10-25 NOTE — Progress Notes (Signed)
 She presents today after have not seen her for a year or so with a chief concern of a painful area on the lateral aspect of her right foot as she points to the fifth metatarsal of the right foot.  She states that she has been to her primary care provider who put her on Bactroban  ointment.  States that is painful with shoe gear.  Objective: Vital signs are stable she is alert oriented x 3.  Pulses are palpable.  She has a very prominent fifth metatarsal base of the right foot with what appears to be a small callus overlying it.  Currently does not demonstrate any type of infection at this point but she has severe fat pad atrophy and subcutaneous atrophy all over her feet.  So the fifth metatarsal is very prominent.  Radiographs taken today demonstrate osseously mature foot she does have some mild bone hypertrophy of the fifth met base secondary to a previous fracture.  Otherwise osteopenia with demineralization of the bone.  Assessment: Painful benign skin lesion lateral aspect of the fifth metatarsal base right foot.  Metatarsalgia fifth metatarsal base  Plan: Demonstrated to her how to place padding and I even cut a metatarsal silicone pad free from its hallux separation to a place that on the lateral aspect of her foot she tolerated this well and is really happy with it.  Follow-up with her on an as-needed basis.

## 2023-10-25 NOTE — Progress Notes (Signed)
 No critical labs need to be addressed urgently. We will discuss labs in detail at upcoming office visit.

## 2023-10-26 ENCOUNTER — Ambulatory Visit: Payer: Medicare Other

## 2023-10-26 VITALS — BP 110/70 | Ht 66.0 in | Wt 106.6 lb

## 2023-10-26 DIAGNOSIS — Z0000 Encounter for general adult medical examination without abnormal findings: Secondary | ICD-10-CM

## 2023-10-26 DIAGNOSIS — H9190 Unspecified hearing loss, unspecified ear: Secondary | ICD-10-CM

## 2023-10-26 NOTE — Progress Notes (Addendum)
 Subjective:   Jessica Miles is a 78 y.o. who presents for a Medicare Wellness preventive visit.  As a reminder, Annual Wellness Visits don't include a physical exam, and some assessments may be limited, especially if this visit is performed virtually. We may recommend an in-person follow-up visit with your provider if needed.  Visit Complete: In person  Persons Participating in Visit: Patient.  AWV Questionnaire: Yes: Patient Medicare AWV questionnaire was completed by the patient on 10/21/23; I have confirmed that all information answered by patient is correct and no changes since this date.  Cardiac Risk Factors include: advanced age (>53men, >52 women);sedentary lifestyle     Objective:    Today's Vitals   10/26/23 0805  BP: 110/70  Weight: 106 lb 9.6 oz (48.4 kg)  Height: 5' 6 (1.676 m)   Body mass index is 17.21 kg/m.     10/26/2023    8:25 AM 01/31/2023    8:51 AM 01/24/2023   12:03 PM 10/25/2022    8:07 AM 01/25/2022    7:56 AM 10/21/2021    8:59 AM 08/07/2017    2:07 PM  Advanced Directives  Does Patient Have a Medical Advance Directive? Yes Yes Yes Yes Yes Yes Yes   Type of Estate agent of [place];Living will Healthcare Power of [place];Living will Healthcare Power of [place];Living will Healthcare Power of [place];Living will Healthcare Power of [place];Living will Healthcare Power of [place];Living will Living will;Healthcare Power of Attorney  Does patient want to make changes to medical advance directive?   No - Patient declined      Copy of Healthcare Power of Attorney in Chart? Yes - validated most recent copy scanned in chart (See row information) No - copy requested No - copy requested No - copy requested No - copy requested No - copy requested   Would patient like information on creating a medical advance directive?     No - Patient declined       Data saved with a previous flowsheet row definition    Current Medications  (verified) Outpatient Encounter Medications as of 10/26/2023  Medication Sig   alendronate  (FOSAMAX ) 70 MG tablet Take 1 tablet (70 mg total) by mouth every 7 (seven) days. Take with a full glass of water on an empty stomach.   ALPRAZolam  (XANAX ) 1 MG tablet Take 1 tablet (1 mg total) by mouth at bedtime as needed (muscle spasms).   atenolol (TENORMIN) 25 MG tablet Take 25 mg by mouth daily as needed (palpitations/pulse racing).   baclofen (LIORESAL) 10 MG tablet Take 10 mg by mouth daily.   buPROPion  ER (WELLBUTRIN  SR) 100 MG 12 hr tablet Take 100 mg by mouth daily.   busPIRone  (BUSPAR ) 5 MG tablet Take 1 tablet (5 mg total) by mouth 2 (two) times daily as needed (anxiety).   Calcium  Carb-Cholecalciferol (CALCIUM  600 + D PO) Take 1 tablet by mouth in the morning. 600 mg /400 mcg   colestipol  (COLESTID ) 1 g tablet Take 1 g by mouth daily.   estradiol  (ESTRACE ) 0.1 MG/GM vaginal cream 0.5 g of cream intravaginally administered daily at bedtime for 2 weeks, then reduce to twice weekly   ketoconazole  (NIZORAL ) 2 % cream Apply 1 Application topically 2 (two) times daily as needed for irritation.  Apply between the third and fourth toe twice a day as needed   levothyroxine  (SYNTHROID ) 112 MCG tablet Take 1 tablet (112 mcg total) by mouth daily.   Lifitegrast (XIIDRA) 5 % SOLN Place 1  drop into both eyes 2 (two) times daily.   Magnesium  250 MG TABS Take 1 tablet by mouth daily.   Methylcellulose, Laxative, (CITRUCEL) 500 MG TABS Take 6 tablets by mouth daily.   mupirocin  cream (BACTROBAN ) 2 % Apply 1 Application topically 2 (two) times daily as needed.   nitroGLYCERIN  (NITRODUR - DOSED IN MG/24 HR) 0.2 mg/hr patch Place 0.2 mg onto the skin daily as needed.   Probiotic Product (PROBIOTIC PO) Take 1 tablet by mouth daily. [place]   triamcinolone  cream (KENALOG ) 0.5 % Apply 1 Application topically 2 (two) times daily as needed.   methylPREDNISolone  (MEDROL  DOSEPAK) 4 MG TBPK tablet Take by mouth as  directed. (Patient not taking: Reported on 10/26/2023)   oxyCODONE (OXY IR/ROXICODONE) 5 MG immediate release tablet Take by mouth. (Patient not taking: Reported on 10/26/2023)   No facility-administered encounter medications on file as of 10/26/2023.    Allergies (verified) Fluticasone, Mometasone, and Fexofenadine   History: Past Medical History:  Diagnosis Date   Allergy    Anxiety    Arthritis    Cataract    COVID-19 virus infection    09-23-2019- 9-16 had monoclonal infusion    Depression    History of IBS    HPV (human papilloma virus) infection    Hyperlipidemia    82- not taking statin    IC (interstitial cystitis)    Loss of taste    Neuropathy 2015   surgery for Mortons neuroma in 1985   Osteoarthritis of lower back    Osteopenia    Osteoporosis    Pudendal neuralgia    Raynaud's disease 02/2020   Tachycardia    Thyroid  disease    Past Surgical History:  Procedure Laterality Date   ANAL RECTAL MANOMETRY N/A 05/03/2023   Procedure: MANOMETRY, ANORECTAL;  Surgeon: Wilhelmenia Aloha Raddle., MD;  Location: [facility];  Service: Gastroenterology;  Laterality: N/A;   BIOPSY  01/25/2022   Procedure: BIOPSY;  Surgeon: Wilhelmenia Aloha Raddle., MD;  Location: [facility];  Service: Gastroenterology;;   COLONOSCOPY  2019   hems only    ESOPHAGOGASTRODUODENOSCOPY (EGD) WITH PROPOFOL  N/A 01/25/2022   Procedure: ESOPHAGOGASTRODUODENOSCOPY (EGD) WITH PROPOFOL ;  Surgeon: Wilhelmenia Aloha Raddle., MD;  Location: [facility];  Service: Gastroenterology;  Laterality: N/A;   EUS N/A 01/25/2022   Procedure: UPPER ENDOSCOPIC ULTRASOUND (EUS) LINEAR;  Surgeon: Wilhelmenia Aloha Raddle., MD;  Location: [facility];  Service: Gastroenterology;  Laterality: N/A;   FOOT NEUROMA SURGERY  1985   WISDOM TOOTH EXTRACTION     Family History  Problem Relation Age of Onset   Stroke Mother 28   Heart failure Mother    Heart attack Mother    Heart disease Mother    Anxiety disorder Mother     Heart attack Father 92   Heart disease Father    Heart disease Brother    Heart failure Brother        CHF   Polymyalgia rheumatica Brother    Heart disease Maternal Grandmother    Hearing loss Maternal Grandmother    Heart attack Maternal Grandfather    Heart disease Maternal Grandfather    Diabetes Son        IDDM   ADD / ADHD Son    Stroke Paternal Uncle    Thyroid  disease Neg Hx    Stomach cancer Neg Hx    Esophageal cancer Neg Hx    Colon polyps Neg Hx    Colon cancer Neg Hx    Rectal cancer  Neg Hx    Inflammatory bowel disease Neg Hx    Liver disease Neg Hx    Pancreatic cancer Neg Hx    Social History   Socioeconomic History   Marital status: Married    Spouse name: Carlin B. Kearse   Number of children: 2   Years of education: MA   Highest education level: Professional school degree (e.g., MD, DDS, DVM, JD)  Occupational History   Occupation: Retired    Comment: Editor, commissioning  Tobacco Use   Smoking status: Never    Passive exposure: Past   Smokeless tobacco: Never  Vaping Use   Vaping status: Never Used  Substance and Sexual Activity   Alcohol use: Not Currently   Drug use: No   Sexual activity: Not Currently  Other Topics Concern   Not on file  Social History Narrative   12/23/20 Patient lives at [facility] with spouse.   Caffeine Use: 1 cup daily   2 kids- 4 grandchildren- two boys and two girls      Social Drivers of Corporate investment banker Strain: Low Risk  (10/21/2023)   Overall Financial Resource Strain (CARDIA)    Difficulty of Paying Living Expenses: Not hard at all  Food Insecurity: No Food Insecurity (10/21/2023)   Hunger Vital Sign    Worried About Running Out of Food in the Last Year: Never true    Ran Out of Food in the Last Year: Never true  Transportation Needs: No Transportation Needs (10/21/2023)   PRAPARE - Administrator, Civil Service (Medical): No    Lack of Transportation (Non-Medical): No  Physical  Activity: Sufficiently Active (10/21/2023)   Exercise Vital Sign    Days of Exercise per Week: 7 days    Minutes of Exercise per Session: 80 min  Stress: Stress Concern Present (10/21/2023)   [employer] of Occupational Health - Occupational Stress Questionnaire    Feeling of Stress: Rather much  Social Connections: Unknown (10/21/2023)   Social Connection and Isolation Panel    Frequency of Communication with Friends and Family: More than three times a week    Frequency of Social Gatherings with Friends and Family: Patient declined    Attends Religious Services: Never    Database administrator or Organizations: Patient declined    Attends Engineer, structural: Not on file    Marital Status: Married  Recent Concern: Social Connections - Moderately Isolated (07/24/2023)   Social Connection and Isolation Panel    Frequency of Communication with Friends and Family: More than three times a week    Frequency of Social Gatherings with Friends and Family: Three times a week    Attends Religious Services: Patient declined    Active Member of Clubs or Organizations: No    Attends Engineer, structural: Not on file    Marital Status: Married    Tobacco Counseling Counseling given: Not Answered   Clinical Intake:  Pre-visit preparation completed: Yes  Pain : No/denies pain   BMI - recorded: 17.21 Nutritional Status: BMI <19  Underweight Nutritional Risks: None Diabetes: No  Lab Results  Component Value Date   HGBA1C 5.6 09/10/2015     How often do you need to have someone help you when you read instructions, pamphlets, or other written materials from your doctor or pharmacy?: 1 - Never  Interpreter Needed?: No  Comments: lives with husband Information entered by :: B.Keirston Saephanh,LPN   Activities of Daily Living 2  10/21/2023    3:13 PM  In your present state of health, do you have any difficulty performing the following activities:  Hearing? 0   Vision? 0  Difficulty concentrating or making decisions? 0  Walking or climbing stairs? 0  Dressing or bathing? 0  Doing errands, shopping? 0  Preparing Food and eating ? N  Using the Toilet? N  In the past six months, have you accidently leaked urine? N  Do you have problems with loss of bowel control? Y  Managing your Medications? N  Managing your Finances? N  Housekeeping or managing your Housekeeping? N    Patient Care Team: Avelina Greig BRAVO, MD as PCP - General (Family Medicine) Verta Royden DASEN, DPM as Consulting Physician (Podiatry) Margaret, Eduard SAUNDERS, MD as Consulting Physician (Neurology) Jaye Fallow, MD as Referring Physician (Ophthalmology)  I have updated your Care Teams any recent Medical Services you may have received from other providers in the past year.     Assessment:   This is a routine wellness examination for Jessica Miles.  Hearing/Vision screen Hearing Screening - Comments:: Pt says her hearingis not good Audiology referral pt request Vision Screening - Comments:: Pt says their vision is good with glasses Dr  Jaye   Goals Addressed               This Visit's Progress     Patient Stated        I would like to get my back straighten out and digestive issues resolved      pt stated        I would likle for muscle twitcing to subside through visits with rheumatologist      COMPLETED: Stay Active (pt-stated)        Continue to exercise.       Depression Screen     10/26/2023    8:19 AM 07/25/2023    8:37 AM 06/07/2023   11:26 AM 03/10/2023    3:25 PM 10/26/2022    9:00 AM 10/25/2022    8:06 AM 10/21/2021   11:26 AM  PHQ 2/9 Scores  PHQ - 2 Score 2 3 2 4  0 0 2  PHQ- 9 Score 7 9 4 8  0  3    Fall Risk     10/21/2023    3:13 PM 10/16/2023    1:51 PM 07/25/2023    8:23 AM 06/07/2023   11:26 AM 10/26/2022    9:00 AM  Fall Risk   Falls in the past year? 1 0 0 0 0  Number falls in past yr:  0 0 0 0  Injury with Fall?  0 0 0 0  Risk for  fall due to :  No Fall Risks No Fall Risks No Fall Risks No Fall Risks  Follow up  Falls evaluation completed Falls evaluation completed Falls evaluation completed Falls evaluation completed    MEDICARE RISK AT HOME:  Medicare Risk at Home Any stairs in or around the home?: (Patient-Rptd) Yes If so, are there any without handrails?: (Patient-Rptd) No Home free of loose throw rugs in walkways, pet beds, electrical cords, etc?: (Patient-Rptd) Yes Adequate lighting in your home to reduce risk of falls?: (Patient-Rptd) Yes Life alert?: (Patient-Rptd) No Use of a cane, walker or w/c?: (Patient-Rptd) No Grab bars in the bathroom?: (Patient-Rptd) Yes Shower chair or bench in shower?: (Patient-Rptd) No Elevated toilet seat or a handicapped toilet?: (Patient-Rptd) Yes  TIMED UP AND GO:  Was the test performed?  Yes  Length of time to ambulate 10 feet: 12 sec Gait slow and steady without use of assistive device  Cognitive Function: 6CIT completed        10/26/2023    8:26 AM 10/25/2022    8:07 AM 10/21/2021    8:59 AM  6CIT Screen  What Year? 0 points 0 points 0 points  What month? 0 points 0 points 0 points  What time? 0 points 0 points 0 points  Count back from 20 0 points 0 points 0 points  Months in reverse 0 points 0 points 0 points  Repeat phrase 0 points 0 points 0 points  Total Score 0 points 0 points 0 points    Immunizations Immunization History  Administered Date(s) Administered    sv, Bivalent, Protein Subunit Rsvpref,pf [place]) 03/04/2022   DT (Pediatric) 01/11/2008   Fluad Quad(high Dose 65+) 10/02/2018, 10/10/2019, 10/21/2021   INFLUENZA, HIGH DOSE SEASONAL PF 10/21/2014, 10/12/2017, 10/11/2020, 10/11/2022, 10/12/2022   Influenza Split 10/25/2006   Influenza, Seasonal, Injecte, Preservative Fre 10/10/2015, 09/29/2016   Influenza-Unspecified 10/21/2011, 10/10/2013   Moderna Covid-19 Fall Seasonal Vaccine 9yrs & older 10/11/2021, 03/26/2022, 09/21/2022,  04/09/2023   Moderna Covid-19 Vaccine Bivalent Booster 35yrs & up 09/27/2020, 05/26/2021   Moderna SARS-COV2 Booster Vaccination 09/27/2020   Moderna Sars-Covid-2 Vaccination 01/24/2019, 02/21/2019, 12/23/2019, 05/16/2020, 10/11/2021, 03/26/2022, 09/28/2022   PFIZER(Purple Top)SARS-COV-2 Vaccination 01/21/2019, 02/21/2019, 12/23/2019, 05/16/2020   Pneumococcal Conjugate-13 10/10/2013, 10/21/2013   Pneumococcal Polysaccharide-23 10/25/2006   Pneumococcal-Unspecified 01/11/2007   Td 08/12/2005   Zoster Recombinant(Shingrix ) 08/17/2019, 12/25/2019   Zoster, Live 01/11/2008, 09/30/2008    Screening Tests Health Maintenance  Topic Date Due   DTaP/Tdap/Td (3 - Tdap) 01/10/2018   Pneumococcal Vaccine: 50+ Years (3 of 3 - PCV20 or PCV21) 10/22/2018   COVID-19 Vaccine (14 - 2025-26 season) 09/11/2023   Medicare Annual Wellness (AWV)  10/25/2023   Mammogram  10/23/2023   Influenza Vaccine  04/09/2024 (Originally 08/11/2023)   DEXA SCAN  Completed   Hepatitis C Screening  Completed   Zoster Vaccines- Shingrix   Completed   Meningococcal B Vaccine  Aged Out   Colonoscopy  Discontinued    Health Maintenance Items Addressed: Pt relays she had Covid and Influenza vaccine at her local pharmacy last month  Additional Screening:  Vision Screening: Recommended annual ophthalmology exams for early detection of glaucoma and other disorders of the eye. Is the patient up to date with their annual eye exam?  Yes  Who is the provider or what is the name of the office in which the patient attends annual eye exams? Dr Jaye  Dental Screening: Recommended annual dental exams for proper oral hygiene  Community Resource Referral / Chronic Care Management: CRR required this visit?  No   CCM required this visit?  No   Plan:    I have personally reviewed and noted the following in the patient's chart:   Medical and social history Use of alcohol, tobacco or illicit drugs  Current medications and  supplements including opioid prescriptions. Patient is not currently taking opioid prescriptions. Functional ability and status Nutritional status Physical activity Advanced directives List of other physicians Hospitalizations, surgeries, and ER visits in previous 12 months Vitals Screenings to include cognitive, depression, and falls Referrals and appointments  In addition, I have reviewed and discussed with patient certain preventive protocols, quality metrics, and best practice recommendations. A written personalized care plan for preventive services as well as general preventive health recommendations were provided to patient.   Erminio LITTIE Saris, LPN  10/26/2023   After Visit Summary: (In Person-Printed) AVS printed and given to the patient  Notes: Nothing significant to report at this time.

## 2023-10-26 NOTE — Patient Instructions (Addendum)
 Ms. Corbridge,  Thank you for taking the time for your Medicare Wellness Visit. I appreciate your continued commitment to your health goals. Please review the care plan we discussed, and feel free to reach out if I can assist you further.  Medicare recommends these wellness visits once per year to help you and your care team stay ahead of potential health issues. These visits are designed to focus on prevention, allowing your provider to concentrate on managing your acute and chronic conditions during your regular appointments.  Please note that Annual Wellness Visits do not include a physical exam. Some assessments may be limited, especially if the visit was conducted virtually. If needed, we may recommend a separate in-person follow-up with your provider.  Ongoing Care Seeing your primary care provider every 3 to 6 months helps us  monitor your health and provide consistent, personalized care.   Referrals If a referral was made during today's visit and you haven't received any updates within two weeks, please contact the referred provider directly to check on the status.  Recommended Screenings:  Health Maintenance  Topic Date Due   DTaP/Tdap/Td vaccine (3 - Tdap) 01/10/2018   Pneumococcal Vaccine for age over 39 (3 of 3 - PCV20 or PCV21) 10/22/2018   COVID-19 Vaccine (14 - 2025-26 season) 09/11/2023   Breast Cancer Screening  10/23/2023   Flu Shot  04/09/2024*   Medicare Annual Wellness Visit  10/25/2024   DEXA scan (bone density measurement)  Completed   Hepatitis C Screening  Completed   Zoster (Shingles) Vaccine  Completed   Meningitis B Vaccine  Aged Out   Colon Cancer Screening  Discontinued  *Topic was postponed. The date shown is not the original due date.       10/26/2023    8:25 AM  Advanced Directives  Does Patient Have a Medical Advance Directive? Yes  Type of Estate agent of [place];Living will  Copy of Healthcare Power of Attorney in Chart? Yes  - validated most recent copy scanned in chart (See row information)   Advance Care Planning is important because it: Ensures you receive medical care that aligns with your values, goals, and preferences. Provides guidance to your family and loved ones, reducing the emotional burden of decision-making during critical moments.  Vision: Annual vision screenings are recommended for early detection of glaucoma, cataracts, and diabetic retinopathy. These exams can also reveal signs of chronic conditions such as diabetes and high blood pressure.  Dental: Annual dental screenings help detect early signs of oral cancer, gum disease, and other conditions linked to overall health, including heart disease and diabetes.  Please see the attached documents for additional preventive care recommendations.

## 2023-11-01 ENCOUNTER — Ambulatory Visit: Payer: Self-pay | Admitting: Family Medicine

## 2023-11-01 ENCOUNTER — Ambulatory Visit

## 2023-11-01 ENCOUNTER — Encounter: Payer: Self-pay | Admitting: Family Medicine

## 2023-11-01 ENCOUNTER — Ambulatory Visit: Payer: Medicare Other | Admitting: Family Medicine

## 2023-11-01 VITALS — BP 112/64 | HR 71 | Temp 98.3°F | Ht 65.25 in | Wt 103.5 lb

## 2023-11-01 VITALS — BP 134/73 | HR 77 | Temp 98.2°F | Resp 12 | Ht 66.5 in | Wt 105.4 lb

## 2023-11-01 DIAGNOSIS — S22020D Wedge compression fracture of second thoracic vertebra, subsequent encounter for fracture with routine healing: Secondary | ICD-10-CM

## 2023-11-01 DIAGNOSIS — S22020A Wedge compression fracture of second thoracic vertebra, initial encounter for closed fracture: Secondary | ICD-10-CM | POA: Insufficient documentation

## 2023-11-01 DIAGNOSIS — F331 Major depressive disorder, recurrent, moderate: Secondary | ICD-10-CM

## 2023-11-01 DIAGNOSIS — Z79899 Other long term (current) drug therapy: Secondary | ICD-10-CM | POA: Insufficient documentation

## 2023-11-01 DIAGNOSIS — R252 Cramp and spasm: Secondary | ICD-10-CM | POA: Diagnosis not present

## 2023-11-01 DIAGNOSIS — M8000XA Age-related osteoporosis with current pathological fracture, unspecified site, initial encounter for fracture: Secondary | ICD-10-CM | POA: Insufficient documentation

## 2023-11-01 DIAGNOSIS — M810 Age-related osteoporosis without current pathological fracture: Secondary | ICD-10-CM | POA: Diagnosis not present

## 2023-11-01 DIAGNOSIS — E039 Hypothyroidism, unspecified: Secondary | ICD-10-CM | POA: Diagnosis not present

## 2023-11-01 DIAGNOSIS — G609 Hereditary and idiopathic neuropathy, unspecified: Secondary | ICD-10-CM

## 2023-11-01 DIAGNOSIS — M545 Low back pain, unspecified: Secondary | ICD-10-CM

## 2023-11-01 LAB — VITAMIN D 25 HYDROXY (VIT D DEFICIENCY, FRACTURES): VITD: 65.98 ng/mL (ref 30.00–100.00)

## 2023-11-01 NOTE — Assessment & Plan Note (Signed)
 Chronic, normal nerve conduction study and EMG per neuro. She plans to discuss again today at office visit with rheumatology.  They discussed possible muscle biopsy. I encouraged her to try AALA for nerve health. She is not currently interested in Lyrica or gabapentin to treat.

## 2023-11-01 NOTE — Assessment & Plan Note (Signed)
Stable, chronic.  Continue current medication.  stable on levothyroxine 100 mcg p.o. daily

## 2023-11-01 NOTE — Progress Notes (Signed)
 Patient ID: Wana Mount, female    DOB: 1945/07/16, 78 y.o.   MRN: [id number]  This visit was conducted in person.  BP 112/64   Pulse 71   Temp 98.3 F (36.8 C) (Oral)   Ht 5' 5.25 (1.657 m)   Wt 103 lb 8 oz (46.9 kg)   SpO2 96%   BMI 17.09 kg/m    CC: Chief Complaint  Patient presents with   Annual Exam    Had AWV 10/26/23.    Subjective:   HPI: Sua Spadafora is a 78 y.o. female presenting on 11/01/2023 for AMW.  The patient presents for review of chronic health problems. He/She also has the following acute concerns today: Had back injury... Dx with compression fracture.. At L2.  She had not been taking the fosamax .  Now seeing Dr. Dow spine specialist. and bone health program for medication treatment.  Pain is improving 2-7/10.SABRA improving.  Currently doing PT.   Idiopathic peripheral nueropathy. She is having more tingling in legs. Treats with lidocaine  patches.  NCS and EMG normal per [facility].  Rheum considering muscle biopsy.    Had AMW 10/26/2023 Reviewed.   GI issues/IBS: followed by Dr. Eda and now with secondary consult with Dr. Leontine, in [place].  Seeing dietician. Wt Readings from Last 3 Encounters:  11/01/23 103 lb 8 oz (46.9 kg)  10/26/23 106 lb 9.6 oz (48.4 kg)  10/16/23 102 lb 12.8 oz (46.6 kg)     MDD: Stable on bupropion  ER 100 mg BID GAD/panic attacks:   She has been more anxious lately.. did have a panic attack last week... interested in having med like buspar  she had in past.     11/01/2023    8:45 AM 07/25/2023    8:38 AM 06/07/2023   11:26 AM 03/10/2023    3:26 PM  GAD 7 : Generalized Anxiety Score  Nervous, Anxious, on Edge 2 1 1 3   Control/stop worrying 1 1 1 3   Worry too much - different things 1 1 2 3   Trouble relaxing 1 3 2 2   Restless 1  0 0  Easily annoyed or irritable 1 0 1 1  Afraid - awful might happen 1 1 2 1   Total GAD 7 Score 8  9 13   Anxiety Difficulty Somewhat difficult Somewhat difficult Somewhat  difficult Very difficult        11/01/2023    8:45 AM 10/26/2023    8:19 AM 07/25/2023    8:37 AM 06/07/2023   11:26 AM 03/10/2023    3:25 PM  Depression screen PHQ 2/9  Decreased Interest 1 1 2 1 1   Down, Depressed, Hopeless 1 1 1 1 3   PHQ - 2 Score 2 2 3 2 4   Altered sleeping 0 0 1 0 0  Tired, decreased energy 1 2 3 2 3   Change in appetite 1 2 0 0 1  Feeling bad or failure about yourself  1 1 0 0 0  Trouble concentrating 1 0 1 0 0  Moving slowly or fidgety/restless 1 0 0 0 0  Suicidal thoughts 0 0 1 0 0  PHQ-9 Score 7 7 9 4 8   Difficult doing work/chores Somewhat difficult Somewhat difficult Somewhat difficult Somewhat difficult Very difficult      SVT: 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   Hypothyroid :  100 mcg daily  of levothyroxine  Lab Results  Component Value Date   TSH 2.65 07/20/2023    Aortic atherosclerosis:  Now watching cholesterol in  diet. Limited with diet.SABRA avoids fried foods. Lab Results  Component Value Date   CHOL 226 (H) 10/25/2023   HDL 97.90 10/25/2023   LDLCALC 117 (H) 10/25/2023   TRIG 58.0 10/25/2023   CHOLHDL 2 10/25/2023    Diet: heart healthy Exercise:Avid biker  20 miles at a time, walks frequently.    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  Decline in renal function GFR 48.. drinking 40 oz per day. Avoiding NSAIDs. Labs were non fasting... glucose 107. Resolved past LFT elevation.  Now in normal range.  Relevant past medical, surgical, family and social history reviewed and updated as indicated. Interim medical history since our last visit reviewed. Allergies and medications reviewed and updated. Outpatient Medications Prior to Visit  Medication Sig Dispense Refill   alendronate  (FOSAMAX ) 70 MG tablet Take 1 tablet (70 mg total) by mouth every 7 (seven) days. Take with a full glass of water on an empty stomach. 4 tablet 11   atenolol (TENORMIN) 25 MG tablet Take 25 mg by mouth daily as  needed (palpitations/pulse racing).     baclofen (LIORESAL) 10 MG tablet Take 10 mg by mouth daily.     buPROPion  ER (WELLBUTRIN  SR) 100 MG 12 hr tablet Take 100 mg by mouth daily.     busPIRone  (BUSPAR ) 5 MG tablet Take 1 tablet (5 mg total) by mouth 2 (two) times daily as needed (anxiety). 180 tablet 0   Calcium  Carb-Cholecalciferol (CALCIUM  600 + D PO) Take 1 tablet by mouth in the morning. 600 mg /400 mcg     estradiol  (ESTRACE ) 0.1 MG/GM vaginal cream 0.5 g of cream intravaginally administered daily at bedtime for 2 weeks, then reduce to twice weekly 42.5 g 12   ketoconazole  (NIZORAL ) 2 % cream Apply 1 Application topically 2 (two) times daily as needed for irritation.  Apply between the third and fourth toe twice a day as needed     levothyroxine  (SYNTHROID ) 112 MCG tablet Take 1 tablet (112 mcg total) by mouth daily. 15 tablet 0   Lifitegrast (XIIDRA) 5 % SOLN Place 1 drop into both eyes 2 (two) times daily.     Magnesium  250 MG TABS Take 1 tablet by mouth daily.     Methylcellulose, Laxative, (CITRUCEL) 500 MG TABS Take 6 tablets by mouth daily.     mupirocin  cream (BACTROBAN ) 2 % Apply 1 Application topically 2 (two) times daily as needed.     nitroGLYCERIN  (NITRODUR - DOSED IN MG/24 HR) 0.2 mg/hr patch Place 0.2 mg onto the skin daily as needed.     Probiotic Product (PROBIOTIC PO) Take 1 tablet by mouth daily. Miome     triamcinolone  cream (KENALOG ) 0.5 % Apply 1 Application topically 2 (two) times daily as needed.     ALPRAZolam  (XANAX ) 1 MG tablet Take 1 tablet (1 mg total) by mouth at bedtime as needed (muscle spasms). 30 tablet 0   colestipol  (COLESTID ) 1 g tablet Take 1 g by mouth daily.     methylPREDNISolone  (MEDROL  DOSEPAK) 4 MG TBPK tablet Take by mouth as directed. (Patient not taking: Reported on 10/26/2023)     oxyCODONE (OXY IR/ROXICODONE) 5 MG immediate release tablet Take by mouth. (Patient not taking: Reported on 10/26/2023)     No facility-administered medications  prior to visit.     Per HPI unless specifically indicated in ROS section below Review of Systems  Constitutional:  Negative for fatigue and fever.  HENT:  Negative for congestion.   Eyes:  Negative for pain.  Respiratory:  Negative for cough and shortness of breath.   Cardiovascular:  Negative for chest pain, palpitations and leg swelling.  Gastrointestinal:  Negative for abdominal pain.  Genitourinary:  Negative for dysuria and vaginal bleeding.  Musculoskeletal:  Negative for back pain.  Neurological:  Negative for syncope, light-headedness and headaches.  Psychiatric/Behavioral:  Negative for dysphoric mood.    Objective:  BP 112/64   Pulse 71   Temp 98.3 F (36.8 C) (Oral)   Ht 5' 5.25 (1.657 m)   Wt 103 lb 8 oz (46.9 kg)   SpO2 96%   BMI 17.09 kg/m   Wt Readings from Last 3 Encounters:  11/01/23 103 lb 8 oz (46.9 kg)  10/26/23 106 lb 9.6 oz (48.4 kg)  10/16/23 102 lb 12.8 oz (46.6 kg)      Physical Exam Constitutional:      General: She is not in acute distress.    Appearance: Normal appearance. She is well-developed. She is not ill-appearing or toxic-appearing.  HENT:     Head: Normocephalic.     Right Ear: Hearing, tympanic membrane, ear canal and external ear normal. Tympanic membrane is not erythematous, retracted or bulging.     Left Ear: Hearing, tympanic membrane, ear canal and external ear normal. Tympanic membrane is not erythematous, retracted or bulging.     Nose: No mucosal edema or rhinorrhea.     Right Sinus: No maxillary sinus tenderness or frontal sinus tenderness.     Left Sinus: No maxillary sinus tenderness or frontal sinus tenderness.     Mouth/Throat:     Pharynx: Uvula midline.  Eyes:     General: Lids are normal. Lids are everted, no foreign bodies appreciated.     Conjunctiva/sclera: Conjunctivae normal.     Pupils: Pupils are equal, round, and reactive to light.  Neck:     Thyroid : No thyroid  mass or thyromegaly.     Vascular: No  carotid bruit.     Trachea: Trachea normal.  Cardiovascular:     Rate and Rhythm: Normal rate and regular rhythm.     Pulses: Normal pulses.     Heart sounds: Normal heart sounds, S1 normal and S2 normal. No murmur heard.    No friction rub. No gallop.  Pulmonary:     Effort: Pulmonary effort is normal. No tachypnea or respiratory distress.     Breath sounds: Normal breath sounds. No decreased breath sounds, wheezing, rhonchi or rales.  Abdominal:     General: Bowel sounds are normal.     Palpations: Abdomen is soft.     Tenderness: There is no abdominal tenderness.  Musculoskeletal:     Cervical back: Normal range of motion and neck supple.  Skin:    General: Skin is warm and dry.     Findings: No rash.  Neurological:     Mental Status: She is alert and oriented to person, place, and time.     GCS: GCS eye subscore is 4. GCS verbal subscore is 5. GCS motor subscore is 6.     Cranial Nerves: No cranial nerve deficit.     Sensory: No sensory deficit.     Motor: No abnormal muscle tone.     Coordination: Coordination normal.     Gait: Gait normal.     Deep Tendon Reflexes: Reflexes are normal and symmetric.     Comments: Nml cerebellar exam   No papilledema  Psychiatric:  Mood and Affect: Mood is not anxious or depressed.        Speech: Speech normal.        Behavior: Behavior normal. Behavior is cooperative.        Thought Content: Thought content normal.        Cognition and Memory: Memory is not impaired. She does not exhibit impaired recent memory or impaired remote memory.        Judgment: Judgment normal.       Results for orders placed or performed in visit on 10/25/23  Comprehensive metabolic panel with GFR   Collection Time: 10/25/23  7:33 AM  Result Value Ref Range   Sodium 134 (L) 135 - 145 mEq/L   Potassium 4.8 3.5 - 5.1 mEq/L   Chloride 96 96 - 112 mEq/L   CO2 31 19 - 32 mEq/L   Glucose, Bld 107 (H) 70 - 99 mg/dL   BUN 19 6 - 23 mg/dL   Creatinine,  Ser 8.90 0.40 - 1.20 mg/dL   Total Bilirubin 0.6 0.2 - 1.2 mg/dL   Alkaline Phosphatase 103 39 - 117 U/L   AST 24 0 - 37 U/L   ALT 21 0 - 35 U/L   Total Protein 7.1 6.0 - 8.3 g/dL   Albumin 4.4 3.5 - 5.2 g/dL   GFR 51.75 (L) >39.99 mL/min   Calcium  9.8 8.4 - 10.5 mg/dL  Lipid panel   Collection Time: 10/25/23  7:33 AM  Result Value Ref Range   Cholesterol 226 (H) 0 - 200 mg/dL   Triglycerides 41.9 0.0 - 149.0 mg/dL   HDL 02.09 >60.99 mg/dL   VLDL 88.3 0.0 - 59.9 mg/dL   LDL Cholesterol 882 (H) 0 - 99 mg/dL   Total CHOL/HDL Ratio 2    NonHDL 128.12     This visit occurred during the SARS-CoV-2 public health emergency.  Safety protocols were in place, including screening questions prior to the visit, additional usage of staff PPE, and extensive cleaning of exam room while observing appropriate contact time as indicated for disinfecting solutions.   COVID 19 screen:  No recent travel or known exposure to COVID19 The patient denies respiratory symptoms of COVID 19 at this time. The importance of social distancing was discussed today.   Assessment and Plan   The patient's preventative maintenance and recommended screening tests for an annual wellness exam were reviewed in full today. Brought up to date unless services declined.  Counselled on the importance of diet, exercise, and its role in overall health and mortality. The patient's FH and SH was reviewed, including their home life, tobacco status, and drug and alcohol status.   Vaccines: S/P COVID 2025 vaccine booster, up-to-date with Shingrix , flu vaccine.   Pap/DVE: not indicated Mammo: 10/2021  nml, repeat q2 years.   Plan. Bone Density: 2015 osteopenia, 10/23 osteoporosis.SABRA will restart fosamax  and recheck in 1 year.  Never restarted. 10/2023 T-3.2 Colon:2022, no further indicated  Smoking Status:none ETOH/ drug ldz:wnwz/wnwz  Hep C: neg  Problem List Items Addressed This Visit     Closed wedge compression fracture of  second thoracic vertebra (HCC)    Acute, wound pain control is 6 weeks out. Palpable pain at this point. She has been referred to [facility] bone health clinic.        Hypothyroidism - Primary   Stable, chronic.  Continue current medication.  stable on levothyroxine  100 mcg p.o. daily      Major depressive disorder, recurrent episode, moderate (HCC)  Chronic, moderate control .SABRA Not interested in  changing medications.   Wellbutrin  SR 100 mg daily (not twice daily).SABRA decreasing did not help with peripheral neuropathy.  Buspar  5 mg 1 tablet 2 times daily as needed       Osteoporosis   Chronic, she had not restarted Fosamax  2024 consistently given her GI issues. Recent new compression fractures. She is now back on Fosamax .  She has an appointment with bone health clinic to discuss options. Will check vitamin D  level today. She is currently on calcium  and vitamin D  supplement.  He does regular weightbearing exercise. We discussed options such as Evenity for a year moving to Prolia.  She has hesitations about both these medications.  She is more interested in consideration of Reclast.  She will discuss this with her rheumatologist at the appointment today and will see the bone health clinic.  She will let me know if I need to assist with starting one of these medications.       Relevant Orders   VITAMIN D  25 Hydroxy (Vit-D Deficiency, Fractures)   Peripheral neuropathy   Chronic, normal nerve conduction study and EMG per neuro. She plans to discuss again today at office visit with rheumatology.  They discussed possible muscle biopsy. I encouraged her to try AALA for nerve health. She is not currently interested in Lyrica or gabapentin to treat.        Greig Ring, MD

## 2023-11-01 NOTE — Assessment & Plan Note (Addendum)
 Chronic, she had not restarted Fosamax  2024 consistently given her GI issues. Recent new compression fractures. She is now back on Fosamax .  She has an appointment with bone health clinic to discuss options. Will check vitamin D  level today. She is currently on calcium  and vitamin D  supplement.  He does regular weightbearing exercise. We discussed options such as Evenity for a year moving to Prolia.  She has hesitations about both these medications.  She is more interested in consideration of Reclast.  She will discuss this with her rheumatologist at the appointment today and will see the bone health clinic.  She will let me know if I need to assist with starting one of these medications.

## 2023-11-01 NOTE — Progress Notes (Signed)
 Pharmacy Note  Subjective:  Patient presents today to [facility] for follow up office visit.   Patient was seen by the pharmacist for counseling on Forteo. She has recent history of L2 compression fracture. Prior metatarsal fracture in July 2016. History of SVT.  She has previously taking alendronate  but had inconsistent adherence due to GI side effects.  Objective: CMP     Component Value Date/Time   NA 134 (L) 10/25/2023 0733   K 4.8 10/25/2023 0733   CL 96 10/25/2023 0733   CO2 31 10/25/2023 0733   GLUCOSE 107 (H) 10/25/2023 0733   BUN 19 10/25/2023 0733   CREATININE 1.09 10/25/2023 0733   CREATININE 1.03 (H) 03/10/2023 1525   CALCIUM  9.8 10/25/2023 0733   PROT 7.1 10/25/2023 0733   PROT 6.8 06/09/2023 1030   ALBUMIN 4.4 10/25/2023 0733   AST 24 10/25/2023 0733   ALT 21 10/25/2023 0733   ALKPHOS 103 10/25/2023 0733   BILITOT 0.6 10/25/2023 0733   Vitamin D  Lab Results  Component Value Date   VD25OH 70.29 07/20/2023   DEXA scan on 10/24/2023  L1-L4 lumbar spine BMD 0.764 T-score -3.2 (-8.5% since baseline) Left femoral neck BMD 0.523 T-score -2.9 Total hip BMD 0.727 T-score -1.8  Prior 10/22/2021 Baseline 11/11/2011  Assessment/Plan:  Patient has recent compression fracture and prior metatarsal fracture with T-score indicating osteoporosis. She requires anabolic treatment. Clinical failure for alendronate  and GI side effect intolerance. History of SVT - caution against use of Evenity due to cardiovascular boxed warning.  Counseled patient on purpose, proper use, storage, and adverse effects of Forteo. Discussed the Forteo is PTH peptide agonist which results in stimulation of osteoblast and bone mass. Reviewed that Forteo treatment course is for 2 years. Discussed that pen is stable for 28 days after first use and must be stored in fridge after each dose. Counseled patient that Forteo is a medication that must be injected once daily and that prescription for  pen needles will be sent with Forteo prescription.  Advised patient to continue taking calcium  1200 mg daily and vitamin D  supplement.  Reviewed the most common adverse effects of Forteo including orthostatic hypotension, GI upset, injection site reaction, and joint pain/arthralgia.  Discussed injection site reaction management and injection site locations. Discussed alternating injection site.  Discussed management of dizziness (which can occur for up to 4 hours after injection) including adequate hydration, slowly rising from bed/chairs to prevent fals, and taking Forteo at night if needed.  We walked through Forteo administration with demo pen and pen needle.  Discussed appropriate disposal of sharps.     Forteo prescription pending baseline labs and pharmacy benefits investigation.  Patient does not qualify for [employer] patient assistance based on our discussion of income criteria.   All questions encouraged and answered.    Sherry Pennant, PharmD, MPH, BCPS, CPP Clinical Pharmacist [facility])

## 2023-11-01 NOTE — Patient Instructions (Signed)
 Jessica Miles

## 2023-11-01 NOTE — Assessment & Plan Note (Signed)
 Acute, wound pain control is 6 weeks out. Palpable pain at this point. She has been referred to [facility] bone health clinic.

## 2023-11-01 NOTE — Assessment & Plan Note (Addendum)
 Chronic, moderate control .Jessica Miles Not interested in  changing medications.   Wellbutrin  SR 100 mg daily (not twice daily).Jessica Miles decreasing did not help with peripheral neuropathy.  Buspar  5 mg 1 tablet 2 times daily as needed

## 2023-11-02 ENCOUNTER — Telehealth: Payer: Self-pay

## 2023-11-02 DIAGNOSIS — M8000XA Age-related osteoporosis with current pathological fracture, unspecified site, initial encounter for fracture: Secondary | ICD-10-CM

## 2023-11-02 NOTE — Telephone Encounter (Signed)
 Dayne Caroline S, RPH-CPP  P Rx Rheum/Pulm Please start Forteo BIV.  Patient does not qualify for patient assistance (based on income criteria for household of two). We reviewed the OOP max for 2025 and 2026 as well as Medicare Prescription Payment Plan. She may prefer to start treatment in 2026 depending on what copay ends up being --------------------------------------------------------------------------------------------------------  Initiated a Prior Authorization request to [employer] for FORTEO via CoverMyMeds. Submission pending population of clinical questions.  Key: [id number]

## 2023-11-03 LAB — PROTEIN ELECTROPHORESIS, SERUM
Albumin ELP: 4.5 g/dL (ref 3.8–4.8)
Alpha 1: 0.3 g/dL (ref 0.2–0.3)
Alpha 2: 0.7 g/dL (ref 0.5–0.9)
Beta 2: 0.3 g/dL (ref 0.2–0.5)
Beta Globulin: 0.4 g/dL (ref 0.4–0.6)
Gamma Globulin: 0.7 g/dL — ABNORMAL LOW (ref 0.8–1.7)
Total Protein: 7 g/dL (ref 6.1–8.1)

## 2023-11-03 LAB — TSH+FREE T4: TSH W/REFLEX TO FT4: 8.72 m[IU]/L — ABNORMAL HIGH (ref 0.40–4.50)

## 2023-11-03 LAB — PHOSPHORUS: Phosphorus: 4.2 mg/dL (ref 2.1–4.3)

## 2023-11-03 LAB — PTH, INTACT AND CALCIUM
Calcium: 9.9 mg/dL (ref 8.6–10.4)
PTH: 20 pg/mL (ref 16–77)

## 2023-11-03 LAB — T4, FREE: Free T4: 1.3 ng/dL (ref 0.8–1.8)

## 2023-11-07 ENCOUNTER — Other Ambulatory Visit

## 2023-11-08 NOTE — Telephone Encounter (Signed)
 Response on CMM; ePA request already received at 2023-11-02 10:05:22.87 with epa ref id [id number]  Response for [id number]: Your Request cannot be completed. Please contact the number on the back of the members card for further assistance.  [employer] prior authorization form for teriparatide completed and faxed with clinicals: Phone: [phone] Fax: [phone]  Sherry Pennant, PharmD, MPH, BCPS, CPP Clinical Pharmacist [facility])

## 2023-11-09 ENCOUNTER — Ambulatory Visit: Payer: Self-pay | Admitting: Family Medicine

## 2023-11-09 LAB — HM MAMMOGRAPHY

## 2023-11-21 ENCOUNTER — Other Ambulatory Visit: Payer: Self-pay

## 2023-11-21 ENCOUNTER — Other Ambulatory Visit (HOSPITAL_COMMUNITY): Payer: Self-pay

## 2023-11-21 NOTE — Telephone Encounter (Signed)
 Called [employer] for update on teriparatide prior authorization . Per rep, they did not receive the request. Initiated on the phone and completed clinicals  Per rep, prior authorization for teriparatide is approved from 10/22/2023 to 11/20/2025.  Letter will be faxed to office and sent to patient's home.  Case # [id number] Phone: [phone]  Test claim is still rejecting   Based on [employer] formulary search, teriparatide would be $16 per pen (28 day supply).  Sherry Pennant, PharmD, MPH, BCPS, CPP Clinical Pharmacist [facility])

## 2023-11-22 ENCOUNTER — Other Ambulatory Visit (HOSPITAL_COMMUNITY): Payer: Self-pay

## 2023-11-22 ENCOUNTER — Other Ambulatory Visit: Payer: Self-pay

## 2023-11-22 MED ORDER — TERIPARATIDE 560 MCG/2.24ML ~~LOC~~ SOPN
20.0000 ug | PEN_INJECTOR | Freq: Every day | SUBCUTANEOUS | 0 refills | Status: DC
  Filled 2023-11-24: qty 2.24, 28d supply, fill #0

## 2023-11-22 MED ORDER — INSULIN PEN NEEDLE 31G X 5 MM MISC
0 refills | Status: DC
  Filled 2023-11-22: qty 100, 100d supply, fill #0
  Filled 2023-11-28 – 2023-12-25 (×2): qty 100, 90d supply, fill #0

## 2023-11-22 NOTE — Telephone Encounter (Signed)
 Patient called back to schedule Forteo appointment. Appointment scheduled for 11/29/23. Notified patient of cost and future delivery plans with Darryle Law. Will send prescription to [facility] and have it couriered to office for appointment.  Jenkins Graces, PharmD PGY1 Pharmacy Resident [phone]

## 2023-11-22 NOTE — Telephone Encounter (Signed)
 Attempted to call patient to provide an update on Forteo and to schedule new start appointment. Left voicemail for patient on preferred number to call back at earliest convenience.   Jenkins Graces, PharmD PGY1 Pharmacy Resident [phone]

## 2023-11-22 NOTE — Telephone Encounter (Signed)
 Rx sent to [facility] to be couriered to office for appt on 11/29/2023  Sherry Pennant, PharmD, MPH, BCPS, CPP Clinical Pharmacist [facility])

## 2023-11-22 NOTE — Telephone Encounter (Signed)
 Patient contacted the office and states she was sent something that states her Forteo was approved and was sent to [facility]. Patient states she was told by [facility] it could be sent there and then sent to her home. Patient states she is confused and does not know what to do. Advised the patient the Pharmacist would call her back. Patient states to use phone number [phone]. Please advise.

## 2023-11-22 NOTE — Telephone Encounter (Signed)
 Called [facility]. Rep states that the correct NDC must be processed (correct NDC is [phone])  Copay is $16 for 1 pen (28 days).  Can contact patient regarding new start. She can start at home If she feels comfortable or can have new start in office (aim for 1 week out to get medication to office on time)  Sherry Pennant, PharmD, MPH, BCPS, CPP Clinical Pharmacist [facility])

## 2023-11-24 ENCOUNTER — Other Ambulatory Visit: Payer: Self-pay

## 2023-11-24 ENCOUNTER — Other Ambulatory Visit (HOSPITAL_COMMUNITY): Payer: Self-pay

## 2023-11-24 NOTE — Progress Notes (Signed)
 Specialty Pharmacy Initial Fill Coordination Note  Jessica Miles is a 78 y.o. female contacted today regarding initial fill of specialty medication(s) Teriparatide   Patient requested Courier to Provider Office   Delivery date: 11/28/23   Verified address: [address] [place], [place] [address]   Medication will be filled on: 11/27/23   Patient is aware of collective $73.31 copayment (medication+needles). Credit card info collected and forwarded to [place] at [facility] ([place]).

## 2023-11-27 ENCOUNTER — Other Ambulatory Visit: Payer: Self-pay

## 2023-11-28 ENCOUNTER — Other Ambulatory Visit (HOSPITAL_COMMUNITY): Payer: Self-pay

## 2023-11-28 ENCOUNTER — Other Ambulatory Visit: Payer: Self-pay

## 2023-11-28 ENCOUNTER — Ambulatory Visit: Attending: Obstetrics and Gynecology | Admitting: Audiologist

## 2023-11-28 DIAGNOSIS — H903 Sensorineural hearing loss, bilateral: Secondary | ICD-10-CM | POA: Diagnosis present

## 2023-11-28 NOTE — Procedures (Signed)
  Outpatient Audiology and [facility] [address] [place], [place]  [address] [phone]  AUDIOLOGICAL  EVALUATION  NAME: Jessica Miles     DOB:   [birth year]      MRN: [id number]                                                                                     DATE: 11/28/2023     REFERENT: Avelina Greig BRAVO, MD STATUS: Outpatient DIAGNOSIS: Sensorineural Hearing Loss Bilateral    History: Redina was seen for an audiological evaluation due to difficulty hearing. Her first hearing test was over thirty years ago. Her grandmother and aunts had hearing loss. She is aware she has some loss but feels she communicated well on a daily basis. Her left ear is the worse ear. There is no genetic condition she knows of that causes hearing loss in her family.  Valta denies pain, pressure, or tinnitus.  Denim has no history of hazardous noise exposure.  Medical history shows no additional risk for hearing loss.   Evaluation:  Otoscopy showed a clear view of the tympanic membranes, bilaterally Tympanometry results were consistent with normal middle ear function, bilaterally   Audiometric testing was completed using Conventional Audiometry techniques with insert earphones and supraural headphones. Test results are consistent with mild sloping to moderate sensorineural hearing loss bilaterally. Speech Recognition Thresholds were obtained at 40dB HL in the right ear and at 30dB HL in the left ear. Word Recognition Testing was completed at  40dB SL and [name] scored 100% in each ear.    Results:  The test results were reviewed with [name] and her husband. [name] has a mild sloping to moderate sensorineural hearing loss bilaterally. She is a good hearing aid candidate. Audiogram explained to Kaijah and her husband. Her high pitched sensorineural  hearing loss will prevent her from hearing most high pitched consonants. [name] will hear people but they will sound muffled and unclear. This is consistent with her  experience.  Audiogram printed and provided to [place].    Recommendations: Hearing aids recommended for both ears. Patient given list of local hearing aid providers.  Annual audiometric testing recommended to monitor hearing loss for progression.    38 minutes spent testing and counseling on results.   If you have any questions please feel free to contact me at [phone].  Lauraine Ka Stalnaker Au.D.  Audiologist   11/28/2023  10:38 AM  Cc: Avelina Greig BRAVO, MD

## 2023-11-29 ENCOUNTER — Ambulatory Visit: Admitting: Pharmacist

## 2023-11-29 ENCOUNTER — Other Ambulatory Visit: Payer: Self-pay

## 2023-11-29 DIAGNOSIS — Z7189 Other specified counseling: Secondary | ICD-10-CM | POA: Insufficient documentation

## 2023-11-29 DIAGNOSIS — M8000XA Age-related osteoporosis with current pathological fracture, unspecified site, initial encounter for fracture: Secondary | ICD-10-CM | POA: Insufficient documentation

## 2023-11-29 MED ORDER — TERIPARATIDE 560 MCG/2.24ML ~~LOC~~ SOPN
20.0000 ug | PEN_INJECTOR | Freq: Every day | SUBCUTANEOUS | 2 refills | Status: DC
  Filled 2023-11-29 – 2023-12-25 (×8): qty 2.24, 28d supply, fill #0

## 2023-11-29 NOTE — Progress Notes (Signed)
 I discussed / reviewed the pharmacy note by Jenkins Graces, PharmD, and I agree with the findings and plans as documented. All appropriate orders and prescriptions have been placed.  Szer, [place], DO  Sherry Pennant, PharmD, MPH, BCPS, CPP Clinical Pharmacist [facility])

## 2023-11-29 NOTE — Progress Notes (Signed)
 Pharmacy Note  Subjective:   Patient presents to clinic today to receive first dose of teriparatide . She is nervous to self-administer injectable medication  Previous treatment for osteoporosis includes alendronate .  Objective: CMP     Component Value Date/Time   NA 134 (L) 10/25/2023 0733   K 4.8 10/25/2023 0733   CL 96 10/25/2023 0733   CO2 31 10/25/2023 0733   GLUCOSE 107 (H) 10/25/2023 0733   BUN 19 10/25/2023 0733   CREATININE 1.09 10/25/2023 0733   CREATININE 1.03 (H) 03/10/2023 1525   CALCIUM  9.9 11/01/2023 1143   PROT 7.0 11/01/2023 1143   PROT 6.8 06/09/2023 1030   ALBUMIN 4.4 10/25/2023 0733   AST 24 10/25/2023 0733   ALT 21 10/25/2023 0733   ALKPHOS 103 10/25/2023 0733   BILITOT 0.6 10/25/2023 0733    CBC    Component Value Date/Time   WBC 5.6 07/25/2023 0914   RBC 4.12 07/25/2023 0914   HGB 12.8 07/25/2023 0914   HCT 38.3 07/25/2023 0914   PLT 235.0 07/25/2023 0914   MCV 93.0 07/25/2023 0914   MCH 30.4 03/10/2023 1525   MCHC 33.5 07/25/2023 0914   RDW 13.5 07/25/2023 0914   LYMPHSABS 1.4 07/25/2023 0914   MONOABS 0.4 07/25/2023 0914   EOSABS 0.1 07/25/2023 0914   BASOSABS 0.1 07/25/2023 0914    T-score: Left hip: -2.9, Total hip: -1.8, Lumbar -3.2 on 11/03/23  Osteoporosis risk factors:  postmenopausal female, female, age > 36, race (white or Asian), low body weight, and dietary calcium  and/or vitamin D  deficiency  Assessment/Plan:  Counseled patient on purpose, proper use, storage, and adverse effects of teriparatide . Discussed that teriparatide  is PTH peptide agonist which results in stimulation of osteoblast and bone mass. Reviewed that teriparatide  treatment course is for 2 years. Discussed that pen is stable for 28 days after first use and must be stored in fridge after each dose. Counseled patient that teriparatide  is a medication that must be injected once daily and that prescription for pen needles will be sent with teriparatide  prescription.   Advised patient to continue taking calcium  1200 mg daily and vitamin D  supplement.  Reviewed the most common adverse effects of teriparatide  including orthostatic hypotension, GI upset, injection site reaction, and joint pain/arthralgia.  Discussed injection site reaction management and injection site locations. Discussed alternating injection site.  Discussed management of dizziness (which can occur for up to 4 hours after injection) including adequate hydration, slowly rising from bed/chairs to prevent falls, and taking teriparatide  at night if needed.  We walked through Forteo  administration with demo pen and pen needle.  Discussed appropriate disposal of sharps.   Demonstrated proper injection technique with teriparatide  demo device and pen needles.  Patient able to demonstrate proper injection technique using the teach back method.  Patient self injected in the right upper thigh with:  Patient-Supplied Medication: Teriparatide   552mch/2.24mL pen NDC: 39494-381-19 Lot: [id number] Expiration: 11/2024  Provided patient with a sample box of Clickfine Pen Needles (#33 needles) NDC: 99694-875-08 Lot: [id number] Expiration:03/09/2024  Patient tolerated injection well.  Patient denies itchiness and irritation at injection., No swelling or redness noted., and Reviewed injection site reaction management with patient verbally and printed information for review in AVS  Teriparatide  approved through insurance.Rx sent to: [facility]: [phone] .  Patient has received a box of 1 pen from pharmacy.  Patient will continue Teriparatide  20 mcg SQ once nightly. Injector pen is to be discarded after 28 days from first use. Patient will  complete two years of therapy and then discuss transitioning to other osteoporosis therapy once treatment course is completed.  Next DEXA scan is due 11/02/2025.  All questions encouraged and answered.  Instructed patient to call with any further questions or  concerns.  Jenkins Graces, PharmD PGY1 Pharmacy Resident [phone]   11/29/2023 8:09 AM

## 2023-11-29 NOTE — Addendum Note (Signed)
 Addended by: DAYNE SHERRY RAMAN on: 11/29/2023 08:59 AM   Modules accepted: Orders

## 2023-11-29 NOTE — Patient Instructions (Addendum)
 Your next TERIPARATIDE dose is due on 11/30/23 at night, 12/01/23 at night, and every day at night thereafter.  Take at bedtime due to possible risk of dizziness up to  4 hours after dose. The pen must be placed back in the fridge every night after you use it. The pen can be used for 28 days. After 28 days, please discard the pen and start with a new pen. Never reuse pen needles. If you have any dental surgeries, please let your dentist know that you are on TERIPARATIDE.  Pharmacy information: Your prescription will be shipped from [facility]. Their phone number is [phone] They will call to schedule shipment and confirm address. They will mail your medication to your home. They should also be shipping pen needles

## 2023-12-01 ENCOUNTER — Encounter: Payer: Self-pay | Admitting: Family Medicine

## 2023-12-01 ENCOUNTER — Ambulatory Visit: Admitting: Family Medicine

## 2023-12-01 ENCOUNTER — Encounter: Payer: Self-pay | Admitting: Gastroenterology

## 2023-12-01 ENCOUNTER — Ambulatory Visit: Payer: Self-pay

## 2023-12-01 ENCOUNTER — Telehealth: Payer: Self-pay | Admitting: *Deleted

## 2023-12-01 VITALS — BP 110/60 | HR 76 | Temp 97.8°F | Ht 65.25 in | Wt 104.0 lb

## 2023-12-01 DIAGNOSIS — R197 Diarrhea, unspecified: Secondary | ICD-10-CM

## 2023-12-01 DIAGNOSIS — F331 Major depressive disorder, recurrent, moderate: Secondary | ICD-10-CM | POA: Diagnosis not present

## 2023-12-01 DIAGNOSIS — R634 Abnormal weight loss: Secondary | ICD-10-CM

## 2023-12-01 DIAGNOSIS — D509 Iron deficiency anemia, unspecified: Secondary | ICD-10-CM

## 2023-12-01 DIAGNOSIS — R7989 Other specified abnormal findings of blood chemistry: Secondary | ICD-10-CM

## 2023-12-01 DIAGNOSIS — R194 Change in bowel habit: Secondary | ICD-10-CM

## 2023-12-01 DIAGNOSIS — R1030 Lower abdominal pain, unspecified: Secondary | ICD-10-CM

## 2023-12-01 DIAGNOSIS — Q453 Other congenital malformations of pancreas and pancreatic duct: Secondary | ICD-10-CM

## 2023-12-01 NOTE — Telephone Encounter (Signed)
 Spoke with Jessica Miles and scheduled her an office visit today at 3:00 pm with Dr. Avelina.

## 2023-12-01 NOTE — Telephone Encounter (Signed)
 Copied from CRM #[id number]. Topic: Clinical - Medical Advice >> Dec 01, 2023  9:44 AM Dedra B wrote: Reason for CRM: Pt calling to follow up on earlier conversation with NT regarding leaving a stool sample. Pls call pt. If she doesn't answer, leave VM and sent message on MyChart.

## 2023-12-01 NOTE — Telephone Encounter (Signed)
  FYI Only or Action Required?: Action required by provider: clinical question for provider, update on patient condition, and patient wants to drop off a stool sample.  Patient was last seen in primary care on 11/01/2023 by Avelina Greig BRAVO, MD.  Called Nurse Triage reporting Diarrhea.  Symptoms began several days ago.  Interventions attempted: Rest, hydration, or home remedies and Dietary changes.  Symptoms are: unchanged.  Triage Disposition: See PCP When Office is Open (Within 3 Days)  Patient/caregiver understands and will follow disposition?: No, wishes to speak with PCP        Copied from CRM #[id number]. Topic: Clinical - Red Word Triage >> Dec 01, 2023  8:03 AM Macario HERO wrote: Red Word that prompted transfer to Nurse Triage: Patient called said she had explosive diarrhea Sunday and this morning and is requesting a stool sample kit. Reason for Disposition  [1] MILD diarrhea (e.g., 1-3 or more stools than normal in past 24 hours) AND [2] present >  7 days  (Exception: Chronic diarrhea that is not worse.)  Answer Assessment - Initial Assessment Questions IBS x 4 years The past week explosive diarrhea twice (Sunday and today) --she states she has been working with specialist to adjust her diet but she wants to rule out any intestinal infection and drop off a stool sample Working with a G.I.  Working with a dietician Abdominal pain Took Imodium & is adequately hydrated Denies abdominal pain at this time, denies fever No blood noted  Patient is advised that an office appointment may be needed for further evaluation but she states that she is working with GI and a dietician and just wants to drop off a sample to rule out infection at this time She states she has a urine cup from the office with a cleansing wipe if that would work to use  Patient is advised to call us  back if anything changes or with any further questions/concerns. Patient is advised that if anything worsens to go  to the Emergency Room. Patient verbalized understanding.  Protocols used: [facility]

## 2023-12-01 NOTE — Progress Notes (Signed)
 Patient ID: Jessica Miles, female    DOB: May 15, 1945, 78 y.o.   MRN: [id number]  This visit was conducted in person.  BP 110/60   Pulse 76   Temp 97.8 F (36.6 C) (Temporal)   Ht 5' 5.25 (1.657 m)   Wt 104 lb (47.2 kg)   SpO2 100%   BMI 17.17 kg/m    CC:  Chief Complaint  Patient presents with   Diarrhea    Started last week   Abdominal Pain    Subjective:   HPI: Jessica Miles is a 78 y.o. female presenting on 12/01/2023 for Diarrhea (Started last week) and Abdominal Pain  Pt with history of  IBS  and chronic diarrhea, underweight presents with acute flare of diarrhea She has been followed by GI in past. In past treated with Rifaximin  for SIBO    She reports that in last 2 weeks  she has noted a change in her stools... has noted soreness in abdomen... has loose stool or gas at 3 AM each day, explosive. ( Stools prior to 2 weeks were formed) Has tried stopping fiber, decreased fruit as instructed by her nutritionist.  Formed loose stool during the day after a meals.  She is very tired, decreased appetite, occ nausea.   She has contacted her GI MD this AM.. no response yet. Cholestipol helped some in past.   No new meds other then forteo .   She feel betters on levo 112 mcg  Relevant past medical, surgical, family and social history reviewed and updated as indicated. Interim medical history since our last visit reviewed. Allergies and medications reviewed and updated. Outpatient Medications Prior to Visit  Medication Sig Dispense Refill   atenolol (TENORMIN) 25 MG tablet Take 25 mg by mouth daily as needed (palpitations/pulse racing).     buPROPion  ER (WELLBUTRIN  SR) 100 MG 12 hr tablet Take 100 mg by mouth daily.     busPIRone  (BUSPAR ) 5 MG tablet Take 1 tablet (5 mg total) by mouth 2 (two) times daily as needed (anxiety). 180 tablet 0   Calcium  Carb-Cholecalciferol (CALCIUM  600 + D PO) Take 1 tablet by mouth in the morning. 600 mg /400 mcg     Insulin  Pen Needle 31G X  5 MM MISC Use to inject teriparatide  once nightly. DO NOT REUSE. Courier to rheum: [address], [address], [place] [address]. Appt on 11/29/23 100 each 0   levothyroxine  (SYNTHROID ) 112 MCG tablet Take 1 tablet (112 mcg total) by mouth daily. 15 tablet 0   Lifitegrast (XIIDRA) 5 % SOLN Place 1 drop into both eyes 2 (two) times daily.     Magnesium  250 MG TABS Take 1 tablet by mouth daily.     nitroGLYCERIN  (NITRODUR - DOSED IN MG/24 HR) 0.2 mg/hr patch Place 0.2 mg onto the skin daily as needed.     Probiotic Product (PROBIOTIC PO) Take 1 tablet by mouth daily. Miome     Teriparatide  (FORTEO ) 560 MCG/2.24ML SOPN Inject 20 mcg into the skin at bedtime. STORE IN FRIDGE. DISCARD AFTER 28 DAYS OF USE. 2.24 mL 2   baclofen (LIORESAL) 10 MG tablet Take 10 mg by mouth daily.     mupirocin  cream (BACTROBAN ) 2 % Apply 1 Application topically 2 (two) times daily as needed.     No facility-administered medications prior to visit.     Per HPI unless specifically indicated in ROS section below Review of Systems  Constitutional:  Negative for fatigue and fever.  HENT:  Negative for congestion.   Eyes:  Negative for pain.  Respiratory:  Negative for cough and shortness of breath.   Cardiovascular:  Negative for chest pain, palpitations and leg swelling.  Gastrointestinal:  Negative for abdominal pain.  Genitourinary:  Negative for dysuria and vaginal bleeding.  Musculoskeletal:  Negative for back pain.  Neurological:  Negative for syncope, light-headedness and headaches.  Psychiatric/Behavioral:  Negative for dysphoric mood.    Objective:  BP 110/60   Pulse 76   Temp 97.8 F (36.6 C) (Temporal)   Ht 5' 5.25 (1.657 m)   Wt 104 lb (47.2 kg)   SpO2 100%   BMI 17.17 kg/m   Wt Readings from Last 3 Encounters:  12/01/23 104 lb (47.2 kg)  11/01/23 105 lb 6.4 oz (47.8 kg)  11/01/23 103 lb 8 oz (46.9 kg)      Physical Exam Constitutional:      General: She is not in acute distress.     Appearance: Normal appearance. She is well-developed. She is not ill-appearing or toxic-appearing.  HENT:     Head: Normocephalic.     Right Ear: Hearing, tympanic membrane, ear canal and external ear normal. Tympanic membrane is not erythematous, retracted or bulging.     Left Ear: Hearing, tympanic membrane, ear canal and external ear normal. Tympanic membrane is not erythematous, retracted or bulging.     Nose: No mucosal edema or rhinorrhea.     Right Sinus: No maxillary sinus tenderness or frontal sinus tenderness.     Left Sinus: No maxillary sinus tenderness or frontal sinus tenderness.     Mouth/Throat:     Pharynx: Uvula midline.  Eyes:     General: Lids are normal. Lids are everted, no foreign bodies appreciated.     Conjunctiva/sclera: Conjunctivae normal.     Pupils: Pupils are equal, round, and reactive to light.  Neck:     Thyroid : No thyroid  mass or thyromegaly.     Vascular: No carotid bruit.     Trachea: Trachea normal.  Cardiovascular:     Rate and Rhythm: Normal rate and regular rhythm.     Pulses: Normal pulses.     Heart sounds: Normal heart sounds, S1 normal and S2 normal. No murmur heard.    No friction rub. No gallop.  Pulmonary:     Effort: Pulmonary effort is normal. No tachypnea or respiratory distress.     Breath sounds: Normal breath sounds. No decreased breath sounds, wheezing, rhonchi or rales.  Abdominal:     General: Bowel sounds are normal.     Palpations: Abdomen is soft.     Tenderness: There is no abdominal tenderness.  Musculoskeletal:     Cervical back: Normal range of motion and neck supple.  Skin:    General: Skin is warm and dry.     Findings: No rash.  Neurological:     Mental Status: She is alert.  Psychiatric:        Mood and Affect: Mood is not anxious or depressed.        Speech: Speech normal.        Behavior: Behavior normal. Behavior is cooperative.        Thought Content: Thought content normal.        Judgment: Judgment  normal.       Results for orders placed or performed in visit on 11/09/23  HM MAMMOGRAPHY   Collection Time: 11/09/23  2:38 PM  Result Value Ref Range   HM Mammogram 0-4  Bi-Rad 0-4 Bi-Rad, Self Reported Normal    Assessment and Plan  Major depressive disorder, recurrent episode, moderate (HCC)  Acute diarrhea Assessment & Plan: Acute worsening of chronic diarrhea issues.  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  Patient has been evaluated extensively by GI as well as IBS specialist.  She has also been treated for SIBO in the past. Will check C. difficile and GI pathogen panel given acute worsening and atypical symptoms. If negative for infection, she will restart colestipol  which has helped in the past with diarrhea.  We did talk in detail about the mind-body connection and IBS.  She appears to be struggling with anxiety and mood issues.  We discussed briefly considering changing her Wellbutrin  and BuSpar  to a different medication such as Cymbalta. She will look at her pharmacology testing profile that she had done before for possible options.  Orders: -     C. difficile GDH and Toxin A/B; Future -     Gastrointestinal Pathogen Pnl RT, PCR; Future    No follow-ups on file.   Greig Ring, MD

## 2023-12-01 NOTE — Telephone Encounter (Signed)
 Next Appt With Family Medicine Darra Ring, MD) 12/01/2023 at 3:00 PM

## 2023-12-01 NOTE — Assessment & Plan Note (Signed)
 Acute worsening of chronic diarrhea issues.  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  Patient has been evaluated extensively by GI as well as IBS specialist.  She has also been treated for SIBO in the past. Will check C. difficile and GI pathogen panel given acute worsening and atypical symptoms. If negative for infection, she will restart colestipol  which has helped in the past with diarrhea.  We did talk in detail about the mind-body connection and IBS.  She appears to be struggling with anxiety and mood issues.  We discussed briefly considering changing her Wellbutrin  and BuSpar  to a different medication such as Cymbalta. She will look at her pharmacology testing profile that she had done before for possible options.

## 2023-12-01 NOTE — Progress Notes (Signed)
 Patient completed first dose of teriparatie at new start visit on 11/29/23 (pt had requested training).  She is nervous to self-administer injectable medication   Previous treatment for osteoporosis includes alendronate .  Patient will continue Teriparatide  20 mcg SQ once nightly. Injector pen is to be discarded after 28 days from first use. Patient will complete two years of therapy and then discuss transitioning to other osteoporosis therapy once treatment course is completed.  Please see visit note from 11/29/23 for further details  Sherry Pennant, PharmD, MPH, BCPS, CPP Clinical Pharmacist [facility])

## 2023-12-04 MED ORDER — COLESTIPOL HCL 5 G PO PACK: 5.0000 g | PACK | Freq: Every day | ORAL | 12 refills | Status: DC

## 2023-12-04 NOTE — Telephone Encounter (Signed)
 Plan for patient to come in for blood work and laboratories and stool studies. CBC/CMP/ESR/CRP. Stool culture/ova and parasite/C. difficile. May initiate colestipol  once she has given the stool tests. Thanks. GM

## 2023-12-04 NOTE — Telephone Encounter (Signed)
 I would recommend a 1 month supply.

## 2023-12-04 NOTE — Addendum Note (Signed)
 Addended by: ANITRA ODETTA CROME on: 12/04/2023 12:06 PM   Modules accepted: Orders

## 2023-12-04 NOTE — Telephone Encounter (Signed)
 Jessica Miles, For the Colestid , I would recommend 5 g daily.  Depending on which of the tablets that she can get.  We would not plan to increase that much more significantly for now. Thanks. GM

## 2023-12-04 NOTE — Telephone Encounter (Signed)
 Dr Wilhelmenia please give direction for colestipol .

## 2023-12-04 NOTE — Addendum Note (Signed)
 Addended by: ANITRA ODETTA CROME on: 12/04/2023 10:01 AM   Modules accepted: Orders

## 2023-12-05 ENCOUNTER — Other Ambulatory Visit: Payer: Self-pay | Admitting: Radiology

## 2023-12-05 ENCOUNTER — Other Ambulatory Visit

## 2023-12-05 ENCOUNTER — Other Ambulatory Visit (INDEPENDENT_AMBULATORY_CARE_PROVIDER_SITE_OTHER)

## 2023-12-05 ENCOUNTER — Ambulatory Visit: Payer: Self-pay | Admitting: Gastroenterology

## 2023-12-05 DIAGNOSIS — R7989 Other specified abnormal findings of blood chemistry: Secondary | ICD-10-CM

## 2023-12-05 DIAGNOSIS — R1030 Lower abdominal pain, unspecified: Secondary | ICD-10-CM

## 2023-12-05 DIAGNOSIS — Q453 Other congenital malformations of pancreas and pancreatic duct: Secondary | ICD-10-CM | POA: Diagnosis not present

## 2023-12-05 DIAGNOSIS — R197 Diarrhea, unspecified: Secondary | ICD-10-CM

## 2023-12-05 DIAGNOSIS — R634 Abnormal weight loss: Secondary | ICD-10-CM

## 2023-12-05 DIAGNOSIS — R194 Change in bowel habit: Secondary | ICD-10-CM

## 2023-12-05 DIAGNOSIS — D509 Iron deficiency anemia, unspecified: Secondary | ICD-10-CM | POA: Diagnosis not present

## 2023-12-05 LAB — COMPREHENSIVE METABOLIC PANEL WITH GFR
ALT: 19 U/L (ref 0–35)
AST: 22 U/L (ref 0–37)
Albumin: 4.6 g/dL (ref 3.5–5.2)
Alkaline Phosphatase: 62 U/L (ref 39–117)
BUN: 20 mg/dL (ref 6–23)
CO2: 29 meq/L (ref 19–32)
Calcium: 9.5 mg/dL (ref 8.4–10.5)
Chloride: 99 meq/L (ref 96–112)
Creatinine, Ser: 1.15 mg/dL (ref 0.40–1.20)
GFR: 45.2 mL/min — ABNORMAL LOW (ref >60.00)
Glucose, Bld: 110 mg/dL — ABNORMAL HIGH (ref 70–99)
Potassium: 4.4 meq/L (ref 3.5–5.1)
Sodium: 137 meq/L (ref 135–145)
Total Bilirubin: 0.7 mg/dL (ref 0.2–1.2)
Total Protein: 7.2 g/dL (ref 6.0–8.3)

## 2023-12-05 LAB — SEDIMENTATION RATE: Sed Rate: 12 mm/h (ref 0–30)

## 2023-12-05 LAB — CBC WITH DIFFERENTIAL/PLATELET
Basophils Absolute: 0.1 K/uL (ref 0.0–0.1)
Basophils Relative: 1.2 % (ref 0.0–3.0)
Eosinophils Absolute: 0 K/uL (ref 0.0–0.7)
Eosinophils Relative: 0.9 % (ref 0.0–5.0)
HCT: 41.6 % (ref 36.0–46.0)
Hemoglobin: 14.3 g/dL (ref 12.0–15.0)
Lymphocytes Relative: 24.6 % (ref 12.0–46.0)
Lymphs Abs: 1.2 K/uL (ref 0.7–4.0)
MCHC: 34.3 g/dL (ref 30.0–36.0)
MCV: 90 fl (ref 78.0–100.0)
Monocytes Absolute: 0.3 K/uL (ref 0.1–1.0)
Monocytes Relative: 6.4 % (ref 3.0–12.0)
Neutro Abs: 3.3 K/uL (ref 1.4–7.7)
Neutrophils Relative %: 66.9 % (ref 43.0–77.0)
Platelets: 269 K/uL (ref 150.0–400.0)
RBC: 4.62 Mil/uL (ref 3.87–5.11)
RDW: 13.2 % (ref 11.5–15.5)
WBC: 4.9 K/uL (ref 4.0–10.5)

## 2023-12-05 LAB — HIGH SENSITIVITY CRP: CRP, High Sensitivity: 0.42 mg/L (ref 0.000–5.000)

## 2023-12-06 LAB — C. DIFFICILE GDH AND TOXIN A/B
GDH ANTIGEN: NOT DETECTED
MICRO NUMBER:: 17281436
SPECIMEN QUALITY:: ADEQUATE
TOXIN A AND B: NOT DETECTED

## 2023-12-06 LAB — CLOSTRIDIUM DIFFICILE BY PCR: Toxigenic C. Difficile by PCR: NEGATIVE

## 2023-12-09 LAB — GASTROINTESTINAL PATHOGEN PNL
CampyloBacter Group: NOT DETECTED
Norovirus GI/GII: NOT DETECTED
Rotavirus A: NOT DETECTED
Salmonella species: NOT DETECTED
Shiga Toxin 1: NOT DETECTED
Shiga Toxin 2: NOT DETECTED
Shigella Species: NOT DETECTED
Vibrio Group: NOT DETECTED
Yersinia enterocolitica: NOT DETECTED

## 2023-12-09 LAB — STOOL CULTURE: E coli, Shiga toxin Assay: NEGATIVE

## 2023-12-10 ENCOUNTER — Encounter: Payer: Self-pay | Admitting: Family Medicine

## 2023-12-10 DIAGNOSIS — K529 Noninfective gastroenteritis and colitis, unspecified: Secondary | ICD-10-CM

## 2023-12-10 LAB — OVA AND PARASITE EXAMINATION
CONCENTRATE RESULT:: NONE SEEN
MICRO NUMBER:: 17281199
SPECIMEN QUALITY:: ADEQUATE
TRICHROME RESULT:: NONE SEEN

## 2023-12-12 ENCOUNTER — Ambulatory Visit: Payer: Self-pay | Admitting: Family Medicine

## 2023-12-15 ENCOUNTER — Encounter: Payer: Self-pay | Admitting: Family Medicine

## 2023-12-18 ENCOUNTER — Other Ambulatory Visit: Payer: Self-pay

## 2023-12-18 ENCOUNTER — Other Ambulatory Visit (HOSPITAL_COMMUNITY): Payer: Self-pay

## 2023-12-19 ENCOUNTER — Other Ambulatory Visit: Payer: Self-pay

## 2023-12-20 ENCOUNTER — Other Ambulatory Visit: Payer: Self-pay

## 2023-12-21 ENCOUNTER — Other Ambulatory Visit: Payer: Self-pay

## 2023-12-22 ENCOUNTER — Other Ambulatory Visit (HOSPITAL_COMMUNITY): Payer: Self-pay

## 2023-12-22 ENCOUNTER — Other Ambulatory Visit: Payer: Self-pay

## 2023-12-22 MED ORDER — BUSPIRONE HCL 5 MG PO TABS: 5.0000 mg | ORAL_TABLET | Freq: Two times a day (BID) | ORAL | 0 refills | Status: DC | PRN

## 2023-12-22 MED ORDER — BUPROPION HCL ER (XL) 150 MG PO TB24: 150.0000 mg | ORAL_TABLET | Freq: Every day | ORAL | 0 refills | Status: DC

## 2023-12-22 NOTE — Addendum Note (Signed)
 Addended by: AVELINA NO E on: 12/22/2023 02:44 PM   Modules accepted: Orders

## 2023-12-25 ENCOUNTER — Other Ambulatory Visit (HOSPITAL_COMMUNITY): Payer: Self-pay

## 2023-12-25 ENCOUNTER — Other Ambulatory Visit: Payer: Self-pay

## 2023-12-25 ENCOUNTER — Telehealth: Payer: Self-pay | Admitting: Pharmacist

## 2023-12-25 DIAGNOSIS — M8000XA Age-related osteoporosis with current pathological fracture, unspecified site, initial encounter for fracture: Secondary | ICD-10-CM

## 2023-12-25 MED ORDER — INSULIN PEN NEEDLE 31G X 5 MM MISC
3 refills | Status: AC
  Filled 2023-12-25: qty 100, 90d supply, fill #0

## 2023-12-25 MED ORDER — TERIPARATIDE 560 MCG/2.24ML ~~LOC~~ SOPN
20.0000 ug | PEN_INJECTOR | Freq: Every day | SUBCUTANEOUS | 0 refills | Status: DC
  Filled 2023-12-25 (×2): qty 2.24, 28d supply, fill #0

## 2023-12-25 MED ORDER — FORTEO 560 MCG/2.24ML ~~LOC~~ SOPN
20.0000 ug | PEN_INJECTOR | Freq: Every day | SUBCUTANEOUS | 0 refills | Status: DC
  Filled 2023-12-25: qty 2.24, 28d supply, fill #0
  Filled 2024-01-16: qty 2.24, 28d supply, fill #1

## 2023-12-25 NOTE — Addendum Note (Signed)
 Addended by: CENA ALFONSO CROME on: 12/25/2023 12:17 PM   Modules accepted: Orders

## 2023-12-25 NOTE — Progress Notes (Signed)
 Specialty Pharmacy Refill Coordination Note  Jessica Miles is a 78 y.o. female contacted today regarding refills of specialty medication(s) Teriparatide    Patient requested Delivery   Delivery date: 12/27/23   Verified address: [address] [place] [address]   Medication will be filled on: 12/26/23

## 2023-12-25 NOTE — Telephone Encounter (Signed)
 Received multiple VM from patient about teriparatide  shipment. States she received notice that generic is not covered. States she called pharmacy but couldn't reach anyone.  Communication initiated via secure chat with Rai. Needs Forteo  brand only sent to [facility].  Pen needles also need to be sent to pharmacy  Sherry Pennant, PharmD, MPH, BCPS, CPP Clinical Pharmacist

## 2023-12-25 NOTE — Addendum Note (Signed)
 Addended by: CENA ALFONSO CROME on: 12/25/2023 12:19 PM   Modules accepted: Orders

## 2023-12-25 NOTE — Telephone Encounter (Signed)
 Last Fill: 11/29/2023  Labs: 12/05/2023 Glucose 110 GFR 45.20 Rest of CBC and CMP WNL  Next Visit: 02/01/2024  Last Visit: 11/01/2023  DX: Age-related osteoporosis with current pathological fracture, initial encounter   Current Dose per office note 11/01/2023: dose not discussed.  Okay to refill Forteo ?

## 2023-12-25 NOTE — Progress Notes (Signed)
 Looks like you all were able to get a successful claim, do you still need assistance?

## 2023-12-27 NOTE — Telephone Encounter (Signed)
 This referral was processed today and was faxed to   [facility] [address], [place], [place] [address] Phone: [phone]  Patient was sent a MyChart letter with Referral and contact information, which advises the patient to reach out to them directly if not heard anything within 5 business days.   I have called [facility] to follow up and see if they have seen her referral come through yet.   They do not have the referral at this time, but there are faxes that are coming through as we speak but they wont be reviewed for 24-48hrs.  They provided me an alternate Fax Number to send this referral. Fax: [phone]  I have faxed this again. They stated that it will be a day or two before they will process new referrals coming in today via fax.    Per Care Everywhere/Epic the patient was seen by [facility] on 12/9 - Dr Gwynneth where she is currently established.   I attempted to contact the patient to speak with her regarding her referral but she did not answer. I have sent her a MyChart message.

## 2024-01-15 ENCOUNTER — Other Ambulatory Visit: Payer: Self-pay | Admitting: Family Medicine

## 2024-01-15 ENCOUNTER — Encounter: Payer: Self-pay | Admitting: Family Medicine

## 2024-01-15 DIAGNOSIS — F331 Major depressive disorder, recurrent, moderate: Secondary | ICD-10-CM

## 2024-01-15 MED ORDER — LEVOTHYROXINE SODIUM 112 MCG PO TABS: 112.0000 ug | ORAL_TABLET | Freq: Every day | ORAL | 3 refills | Status: AC

## 2024-01-15 MED ORDER — BUSPIRONE HCL 5 MG PO TABS: 5.0000 mg | ORAL_TABLET | Freq: Two times a day (BID) | ORAL | 1 refills | Status: DC | PRN

## 2024-01-15 NOTE — Telephone Encounter (Signed)
 Left message for Jessica Miles to return call to office to verify her current levothyroxine  dose 112 mcg or 100 mcg.

## 2024-01-16 ENCOUNTER — Other Ambulatory Visit: Payer: Self-pay

## 2024-01-16 MED ORDER — BUSPIRONE HCL 5 MG PO TABS: 5.0000 mg | ORAL_TABLET | Freq: Two times a day (BID) | ORAL | 1 refills | Status: AC | PRN

## 2024-01-16 NOTE — Progress Notes (Signed)
 Thank you for your response. Your medication is scheduled for delivery on 01/19/24  If you do not receive it by the scheduled date, please call [facility] at [phone] for assistance.

## 2024-01-17 ENCOUNTER — Other Ambulatory Visit (HOSPITAL_COMMUNITY): Payer: Self-pay

## 2024-01-17 ENCOUNTER — Other Ambulatory Visit: Payer: Self-pay

## 2024-01-17 ENCOUNTER — Other Ambulatory Visit: Payer: Self-pay | Admitting: Pharmacist

## 2024-01-17 NOTE — Progress Notes (Signed)
 Clinical Intervention Note  Clinical Intervention Notes: Patient is having cataract sugery and dental surgery in the near future and wanted to know if her Forteo  would interfere with either procedure. Counseled her to make sure her dentist and opthamalogist is aware that she is on Forteo  and that they may have her hold her dose but ultimately it was their decision.   Clinical Intervention Outcomes: Prevention of an adverse drug event   Lyle LELON Chalk Specialty Pharmacist

## 2024-01-18 ENCOUNTER — Other Ambulatory Visit: Payer: Self-pay | Admitting: Family Medicine

## 2024-01-18 ENCOUNTER — Other Ambulatory Visit: Payer: Self-pay

## 2024-01-19 ENCOUNTER — Ambulatory Visit: Admitting: Gastroenterology

## 2024-01-19 ENCOUNTER — Encounter: Payer: Self-pay | Admitting: Gastroenterology

## 2024-01-19 VITALS — BP 122/68 | HR 77 | Ht 66.0 in | Wt 104.6 lb

## 2024-01-19 DIAGNOSIS — K9089 Other intestinal malabsorption: Secondary | ICD-10-CM | POA: Diagnosis not present

## 2024-01-19 DIAGNOSIS — R1084 Generalized abdominal pain: Secondary | ICD-10-CM

## 2024-01-19 DIAGNOSIS — R152 Fecal urgency: Secondary | ICD-10-CM | POA: Diagnosis not present

## 2024-01-19 DIAGNOSIS — K589 Irritable bowel syndrome without diarrhea: Secondary | ICD-10-CM

## 2024-01-19 NOTE — Progress Notes (Unsigned)
 "  GASTROENTEROLOGY OUTPATIENT CLINIC VISIT   Primary Care Provider Avelina Greig BRAVO, MD [address] [place] [address] [phone]  Patient Profile: Jessica Miles is a 79 y.o. female with a pmh significant for Raynaud's disease, hypertension, prior COVID infection, anxiety, allergies, pancreatic duct dilation NOS, diverticulosis, SIBO.  The patient presents to the [facility] for an evaluation and management of problem(s) noted below:  Problem List No diagnosis found.  Discussed the use of AI scribe software for clinical note transcription with the patient, who gave verbal consent to proceed.  History of Present Illness Please see prior GI notes for full details of HPI.  Interval History         The patient does/does not take NSAIDs or BC/Goody Powder. Patient has/has not had an EGD. Patient has/has not had a Colonoscopy.  GI Review of Systems Positive as above Negative for  Pyrosis; Reflux; Regurgitation; Dysphagia; Odynophagia; Globus; Post-prandial cough; Nocturnal cough; Nasal regurgitation; Epigastric pain; Nausea; Vomiting; Hematemesis; Jaundice; Change in Appetite; Early satiety; Abdominal pain; Abdominal bloating; Eructation; Flatulence; Change in BM Frequency; Change in BM Consistency; Constipation; Diarrhea; Incontinence; Urgency; Tenesmus; Hematochezia; Melena  Review of Systems General: Denies fevers/chills/weight loss unintentionally Cardiovascular: Denies chest pain Pulmonary: Denies shortness of breath Gastroenterological: See HPI Genitourinary: Denies darkened urine Hematological: Denies easy bruising/bleeding Endocrine: Denies temperature intolerance Dermatological: Denies jaundice Psychological: Mood is stable  Medications Current Outpatient Medications  Medication Sig Dispense Refill   atenolol (TENORMIN) 25 MG tablet Take 25 mg by mouth daily as needed (palpitations/pulse racing).     buPROPion  (WELLBUTRIN  XL) 150 MG  24 hr tablet TAKE 1 TABLET(150 MG) BY MOUTH DAILY 90 tablet 1   busPIRone  (BUSPAR ) 5 MG tablet Take 1 tablet (5 mg total) by mouth 2 (two) times daily as needed (anxiety). 180 tablet 1   Calcium  Carb-Cholecalciferol (CALCIUM  600 + D PO) Take 1 tablet by mouth in the morning. 600 mg /400 mcg     colestipol  (COLESTID ) 5 g packet Take 5 g by mouth daily. 30 each 12   FORTEO  560 MCG/2.24ML SOPN Inject 20 mcg into the skin daily. 7.44 mL 0   Insulin  Pen Needle 31G X 5 MM MISC Use to inject teriparatide  once nightly. DO NOT REUSE. Courier to rheum: [address], [place] [address]. Appt on 11/29/23 100 each 3   levothyroxine  (SYNTHROID ) 112 MCG tablet Take 1 tablet (112 mcg total) by mouth daily. 90 tablet 3   Lifitegrast (XIIDRA) 5 % SOLN Place 1 drop into both eyes 2 (two) times daily.     Magnesium  250 MG TABS Take 1 tablet by mouth daily.     nitroGLYCERIN  (NITRODUR - DOSED IN MG/24 HR) 0.2 mg/hr patch Place 0.2 mg onto the skin daily as needed.     Probiotic Product (PROBIOTIC PO) Take 1 tablet by mouth daily. Mi[place]     No current facility-administered medications for this visit.    Allergies Allergies  Allergen Reactions   Fluticasone Other (See Comments)    Nose bleeds   Mometasone Other (See Comments)    Epistaxis   Fexofenadine Palpitations    Histories Past Medical History:  Diagnosis Date   Allergy    Anxiety    Arthritis    Cataract    Compression fracture of L2 (HCC)    COVID-19 virus infection    09-23-2019- 9-16 had monoclonal infusion    Depression    History of IBS    HPV (human  papilloma virus) infection    Hyperlipidemia    82- not taking statin    IC (interstitial cystitis)    Loss of taste    Neuropathy 2015   surgery for Mortons neuroma in 1985   Osteoarthritis of lower back    Osteopenia    Osteoporosis    Pudendal neuralgia    Raynaud's disease 02/2020   Tachycardia    Thyroid  disease    Past Surgical History:  Procedure Laterality  Date   ANAL RECTAL MANOMETRY N/A 05/03/2023   Procedure: MANOMETRY, ANORECTAL;  Surgeon: Wilhelmenia Aloha Raddle., MD;  Location: [facility];  Service: Gastroenterology;  Laterality: N/A;   BIOPSY  01/25/2022   Procedure: BIOPSY;  Surgeon: Wilhelmenia Aloha Raddle., MD;  Location: [facility];  Service: Gastroenterology;;   COLONOSCOPY  2019   hems only    ESOPHAGOGASTRODUODENOSCOPY (EGD) WITH PROPOFOL  N/A 01/25/2022   Procedure: ESOPHAGOGASTRODUODENOSCOPY (EGD) WITH PROPOFOL ;  Surgeon: Wilhelmenia Aloha Raddle., MD;  Location: [facility];  Service: Gastroenterology;  Laterality: N/A;   EUS N/A 01/25/2022   Procedure: UPPER ENDOSCOPIC ULTRASOUND (EUS) LINEAR;  Surgeon: Wilhelmenia Aloha Raddle., MD;  Location: [facility];  Service: Gastroenterology;  Laterality: N/A;   FOOT NEUROMA SURGERY  1985   WISDOM TOOTH EXTRACTION     Social History   Socioeconomic History   Marital status: Married    Spouse name: Carlin B. Zielinski   Number of children: 2   Years of education: MA   Highest education level: Professional school degree (e.g., MD, DDS, DVM, JD)  Occupational History   Occupation: Retired    Comment: editor, commissioning  Tobacco Use   Smoking status: Never    Passive exposure: Past   Smokeless tobacco: Never  Vaping Use   Vaping status: Never Used  Substance and Sexual Activity   Alcohol use: Not Currently   Drug use: No   Sexual activity: Not Currently    Birth control/protection: None  Other Topics Concern   Not on file  Social History Narrative   12/23/20 Patient lives at [facility] with spouse.   Caffeine Use: 1 cup daily   2 kids- 4 grandchildren- two boys and two girls      Social Drivers of Health   Tobacco Use: Low Risk (12/01/2023)   Patient History    Smoking Tobacco Use: Never    Smokeless Tobacco Use: Never    Passive Exposure: Past  Financial Resource Strain: Low Risk (10/21/2023)   Overall Financial Resource Strain (CARDIA)    Difficulty of Paying Living  Expenses: Not hard at all  Food Insecurity: No Food Insecurity (10/21/2023)   Epic    Worried About Programme Researcher, Broadcasting/film/video in the Last Year: Never true    Ran Out of Food in the Last Year: Never true  Transportation Needs: No Transportation Needs (10/21/2023)   Epic    Lack of Transportation (Medical): No    Lack of Transportation (Non-Medical): No  Physical Activity: Sufficiently Active (10/21/2023)   Exercise Vital Sign    Days of Exercise per Week: 7 days    Minutes of Exercise per Session: 80 min  Stress: Stress Concern Present (10/21/2023)   [employer] of Occupational Health - Occupational Stress Questionnaire    Feeling of Stress: Rather much  Social Connections: Unknown (10/21/2023)   Social Connection and Isolation Panel    Frequency of Communication with Friends and Family: More than three times a week    Frequency of Social Gatherings with Friends and Family: Patient  declined    Attends Religious Services: Never    Active Member of Clubs or Organizations: Patient declined    Attends Banker Meetings: Not on file    Marital Status: Married  Recent Concern: Social Connections - Moderately Isolated (07/24/2023)   Social Connection and Isolation Panel    Frequency of Communication with Friends and Family: More than three times a week    Frequency of Social Gatherings with Friends and Family: Three times a week    Attends Religious Services: Patient declined    Active Member of Clubs or Organizations: No    Attends Banker Meetings: Not on file    Marital Status: Married  Intimate Partner Violence: Not At Risk (10/26/2023)   Epic    Fear of Current or Ex-Partner: No    Emotionally Abused: No    Physically Abused: No    Sexually Abused: No  Depression (PHQ2-9): Medium Risk (11/01/2023)   Depression (PHQ2-9)    PHQ-2 Score: 7  Alcohol Screen: Low Risk (10/25/2022)   Alcohol Screen    Last Alcohol Screening Score (AUDIT): 0  Housing: Low Risk  (10/21/2023)   Epic    Unable to Pay for Housing in the Last Year: No    Number of Times Moved in the Last Year: 0    Homeless in the Last Year: No  Utilities: Not At Risk (10/26/2023)   Epic    Threatened with loss of utilities: No  Health Literacy: Adequate Health Literacy (10/26/2023)   B1300 Health Literacy    Frequency of need for help with medical instructions: Never   Family History  Problem Relation Age of Onset   Stroke Mother 23   Heart failure Mother    Heart attack Mother    Heart disease Mother    Anxiety disorder Mother    Heart attack Father 76   Heart disease Father    Heart disease Brother    Heart failure Brother        CHF   Polymyalgia rheumatica Brother    Heart disease Maternal Grandmother    Hearing loss Maternal Grandmother    Heart attack Maternal Grandfather    Heart disease Maternal Grandfather    Diabetes Son        IDDM   ADD / ADHD Son    Stroke Paternal Uncle    Thyroid  disease Neg Hx    Stomach cancer Neg Hx    Esophageal cancer Neg Hx    Colon polyps Neg Hx    Colon cancer Neg Hx    Rectal cancer Neg Hx    Inflammatory bowel disease Neg Hx    Liver disease Neg Hx    Pancreatic cancer Neg Hx    I have reviewed Jessica Miles medical, social, and family history in detail and updated the electronic medical record as necessary.    PHYSICAL EXAMINATION  Ht 5' 6 (1.676 m)   Wt 104 lb 9.6 oz (47.4 kg)   BMI 16.88 kg/m  Wt Readings from Last 3 Encounters:  01/19/24 104 lb 9.6 oz (47.4 kg)  12/01/23 104 lb (47.2 kg)  11/01/23 105 lb 6.4 oz (47.8 kg)  GEN: NAD, appears stated age, doesn't appear chronically ill PSYCH: Cooperative, without pressured speech EYE: Conjunctivae pink, sclerae anicteric ENT: MMM CV: Nontachycardic RESP: No audible wheezing GI: NABS, soft, NT/ND, without rebound MSK/EXT: No significant lower extremity edema SKIN: No jaundice NEURO:  Alert & Oriented x 3, no focal deficits   REVIEW  OF DATA  I reviewed the  following data at the time of this encounter:  GI Procedures and Studies  April 2025 anorectal manometry   Laboratory Studies  Reviewed those in epic  Imaging Studies  1/25 abdominal ultrasound IMPRESSION: 1. Large RIGHT renal cysts, largest measuring 6.8 centimeters in the LOWER pole region. As the kidney is ptotic, the LOWER pole cyst is potentially palpable, given its anterior location. 2. Otherwise, exam is normal.    ASSESSMENT  Jessica Miles is a 79 y.o. female with a pmh significant for Raynaud's disease, hypertension, prior COVID infection, anxiety, allergies, pancreatic duct dilation NOS, diverticulosis, SIBO.  The patient is seen today for evaluation and management of:  No diagnosis found.  The patient is hemodynamically stable.  Clinically, Jessica Miles has longstanding issues in regards to Jessica Miles changes in bowel habits Jessica Miles reported unintentional weight loss (though based on our reports, since 2023 Jessica Miles has had a relatively stable weight.  Thankfully Jessica Miles issues with previous fecal incontinence are improved. Many of Jessica Miles symptoms seem functional in origin at this time based on Jessica Miles previous evaluation notable GI providers.  Jessica Miles will continue on FODMAPs.  Will consider repeat SIBO breath testing in future.  If Jessica Miles bowel habit changes persist will need to consider repeat colonoscopy or sigmoidoscopy to evaluate for/collagenous colitis.  Jessica Miles will increase Jessica Miles colestipol  for period time to see if bile salt diarrhea could be playing a role see if that makes a difference for Jessica Miles symptoms.  We need to follow-up the previously noted pancreatic duct dilation with negative EUS 3, if no significant changes, then consider pushing out Jessica Miles surveillance.  May consider additional testing if there have been changes.  Further workup of Jessica Miles elevated CK yesterday is reasonable we will see what it has done, since we are going to redraw labs today and hopefully it is not increasing where Jessica Miles may need to go to the  hospital for IV hydration.  I will try to review Dr.  Leontine records at some point.  Fortunately all patient questions were answered to the best of my ability, and the patient agrees to the aforementioned plan of action with follow-up as indicated.   PLAN  Laboratories as outlined below Order MRI/MRCP follow-up PD dilation Continue low FODMAPs Increase colestipol  to twice daily to see if that is helpful May need to consider colonoscopy/endoscopy for biopsies to rule out microscopic/collagenous colitis Likely consider stopping magnesium , if Jessica Miles is still taking that Continue to monitor weight loss otherwise May need updated CT chest/abdomen/pelvis in future    No orders of the defined types were placed in this encounter.   New Prescriptions   No medications on file   Modified Medications   No medications on file    Planned Follow Up No follow-ups on file.   Total Time in Face-to-Face and in Coordination of Care for patient including independent/personal interpretation/review of prior testing, medical history, examination, medication adjustment, communicating results with the patient directly, and documentation within the EHR is 30 minutes.   Aloha Finner, MD [facility] Office # [phone]  "

## 2024-01-19 NOTE — Patient Instructions (Signed)
 Please make sure that your GI physician at [facility] sends our office a copy of your visit with them. They can fax it to us  at [phone] attn: Dr Wilhelmenia  Follow-up 6 months, sooner if needed.   _______________________________________________________  If your blood pressure at your visit was 140/90 or greater, please contact your primary care physician to follow up on this.  _______________________________________________________  If you are age 62 or older, your body mass index should be between 23-30. Your Body mass index is 16.88 kg/m. If this is out of the aforementioned range listed, please consider follow up with your Primary Care Provider.  If you are age 7 or younger, your body mass index should be between 19-25. Your Body mass index is 16.88 kg/m. If this is out of the aformentioned range listed, please consider follow up with your Primary Care Provider.   ________________________________________________________  The [facility] providers would like to encourage you to use MYCHART to communicate with providers for non-urgent requests or questions.  Due to long hold times on the telephone, sending your provider a message by [facility] may be a faster and more efficient way to get a response.  Please allow 48 business hours for a response.  Please remember that this is for non-urgent requests.  _______________________________________________________  [facility] is using a team-based approach to care.  Your team is made up of your doctor and two to three APPS. Our APPS (Nurse Practitioners and Physician Assistants) work with your physician to ensure care continuity for you. They are fully qualified to address your health concerns and develop a treatment plan. They communicate directly with your gastroenterologist to care for you. Seeing the Advanced Practice Practitioners on your physician's team can help you by facilitating care more promptly, often allowing for earlier  appointments, access to diagnostic testing, procedures, and other specialty referrals.   Thank you for choosing me and [facility].  Dr. Wilhelmenia

## 2024-01-20 ENCOUNTER — Encounter: Payer: Self-pay | Admitting: Gastroenterology

## 2024-01-20 DIAGNOSIS — K9089 Other intestinal malabsorption: Secondary | ICD-10-CM | POA: Insufficient documentation

## 2024-01-20 DIAGNOSIS — R1084 Generalized abdominal pain: Secondary | ICD-10-CM | POA: Insufficient documentation

## 2024-01-20 DIAGNOSIS — R152 Fecal urgency: Secondary | ICD-10-CM | POA: Insufficient documentation

## 2024-01-23 NOTE — Progress Notes (Signed)
 "  Office Visit Note  Patient: Jessica Miles             Date of Birth: [birth year]           MRN: [id number]             PCP: Avelina Greig BRAVO, MD Referring: Avelina Greig BRAVO, MD Visit Date: 02/01/2024 Occupation: Data Unavailable  Subjective:  Medication Management (Stopped Forteo . Patient states she is having trouble with Fatigue and feeling unwell. Patient states she has lost weight as well. )   Discussed the use of AI scribe software for clinical note transcription with the patient, who gave verbal consent to proceed.  History of Present Illness Jessica Miles is an 79 year old female with osteoporosis who presents with side effects from Forteo .  She started Forteo  on November 19th and has been experiencing significant side effects, including bone pain, nausea, loss of appetite, weight loss, and soreness in her quadriceps. The bone pain is particularly noted in her ankles, knees, wrists, fingers, and shoulder. She also experiences dizziness, gas, bloating, and alternating constipation and diarrhea. She paused the medication on Saturday due to feeling 'awful' and noted an improvement in symptoms after stopping.  She takes calcium  supplements, with each tablet containing 400 mg, and she takes four tablets daily. She also consumes calcium -rich foods like spinach.  She has been on Fosamax  in the past but stopped it due to ineffectiveness after a fracture. She is currently managing her diet with the help of a dietitian due to IBS, which causes her to have a 'fear of food'. She experiences alternating constipation and diarrhea, and her diet is very restricted to manage these symptoms.  She is scheduled for cataract surgery and has an upcoming appointment with a functional gastroenterologist at [facility] on February 17th. She has been working with her urogynecologist on interstitial cystitis and is concerned about urinary tract infections.     Activities of Daily Living:  Patient reports morning stiffness  for 0 minutes.   Patient Denies nocturnal pain.  Difficulty dressing/grooming: Denies Difficulty climbing stairs: Denies Difficulty getting out of chair: Denies Difficulty using hands for taps, buttons, cutlery, and/or writing: Denies  Review of Systems  Constitutional:  Positive for fatigue.  HENT:  Negative for mouth sores and mouth dryness.   Eyes:  Positive for dryness.  Respiratory:  Negative for shortness of breath.   Cardiovascular:  Negative for chest pain and palpitations.  Gastrointestinal:  Positive for constipation and diarrhea. Negative for blood in stool.  Endocrine: Negative for increased urination.  Genitourinary:  Negative for involuntary urination.  Musculoskeletal:  Positive for joint pain, joint pain, myalgias and myalgias. Negative for gait problem, joint swelling, muscle weakness, morning stiffness and muscle tenderness.  Skin:  Positive for color change, rash and hair loss. Negative for sensitivity to sunlight.  Allergic/Immunologic: Negative for susceptible to infections.  Neurological:  Positive for dizziness. Negative for headaches.  Hematological:  Negative for swollen glands.  Psychiatric/Behavioral:  Positive for depressed mood. Negative for sleep disturbance. The patient is nervous/anxious.     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   Right upper quadrant abdominal mass 01/26/2023   Panic attacks 10/26/2022   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   Severely underweight adult  09/07/2021   Acute diarrhea 04/27/2021   Transient disorientation 10/20/2020   Memory change 08/05/2020   Family history of early CAD 02/27/2020   Tremor 02/27/2020   Aortic atherosclerosis 12/19/2019   Urinary urgency 05/21/2019   Bladder irritation 05/21/2019   External hemorrhoid 05/21/2019   Raynaud's syndrome 02/09/2019   Osteoarthritis of thumb, 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   Acute left-sided low back pain without sciatica 12/19/2017   SVT (supraventricular tachycardia) 08/31/2017   Peripheral neuropathy 08/31/2017   Atopic dermatitis 03/13/2015   IBS (irritable bowel syndrome) 03/18/2014   Osteoporosis 03/18/2014   Major depressive disorder, recurrent episode, moderate (HCC) 03/18/2014   Hypothyroidism 12/14/2013    Past Medical History:  Diagnosis Date   Allergy    Anxiety    Arthritis    Cataract    Compression fracture of L2 (HCC)    COVID-19 virus infection    09-23-2019- 9-16 had monoclonal infusion    Depression    History of IBS    HPV (human papilloma virus) infection    Hyperlipidemia    82- not taking statin    IC (interstitial cystitis)    Loss of taste    Neuropathy 2015   surgery for Mortons neuroma in 1985   Osteoarthritis of lower back    Osteopenia    Osteoporosis    Pudendal neuralgia    Raynaud's disease 02/2020   Tachycardia    Thyroid  disease     Family History  Problem Relation Age of Onset   Stroke Mother 35   Heart failure Mother    Heart attack Mother    Heart disease Mother    Anxiety disorder Mother    Heart attack Father 34   Heart disease Father    Heart disease Brother    Heart failure Brother        CHF   Polymyalgia rheumatica Brother    Heart disease Maternal Grandmother    Hearing loss Maternal Grandmother    Heart attack Maternal Grandfather    Heart disease Maternal Grandfather    Diabetes Son        IDDM   ADD / ADHD Son    Stroke Paternal Uncle    Thyroid  disease Neg Hx    Stomach cancer Neg Hx    Esophageal cancer  Neg Hx    Colon polyps Neg Hx    Colon cancer Neg Hx    Rectal cancer Neg Hx    Inflammatory bowel disease Neg Hx    Liver disease Neg Hx    Pancreatic cancer Neg Hx    Past Surgical History:  Procedure Laterality Date   ANAL RECTAL MANOMETRY N/A 05/03/2023   Procedure: MANOMETRY, ANORECTAL;  Surgeon: Wilhelmenia Aloha Raddle., MD;  Location: [facility];  Service: Gastroenterology;  Laterality: N/A;   BIOPSY  01/25/2022   Procedure: BIOPSY;  Surgeon: Wilhelmenia Aloha Raddle., MD;  Location: [facility];  Service: Gastroenterology;;   COLONOSCOPY  2019   hems only    ESOPHAGOGASTRODUODENOSCOPY (EGD) WITH PROPOFOL  N/A 01/25/2022   Procedure: ESOPHAGOGASTRODUODENOSCOPY (EGD) WITH PROPOFOL ;  Surgeon: Wilhelmenia Aloha Raddle., MD;  Location: [facility];  Service: Gastroenterology;  Laterality: N/A;   EUS N/A 01/25/2022   Procedure: UPPER ENDOSCOPIC ULTRASOUND (EUS) LINEAR;  Surgeon: Wilhelmenia Aloha Raddle., MD;  Location: [facility];  Service: Gastroenterology;  Laterality: N/A;   FOOT NEUROMA SURGERY  1985   WISDOM TOOTH EXTRACTION     Social History[1] Social History   Social History Narrative   12/23/20 Patient lives at [facility] with spouse.   Caffeine Use: 1 cup daily   2 kids- 4 grandchildren- two boys and two girls        Immunization History  Administered Date(s) Administered    sv, Bivalent, Protein Subunit Rsvpref,pf [place]) 03/04/2022   DT (Pediatric) 01/11/2008   Fluad Quad(high Dose 65+) 10/02/2018, 10/10/2019, 10/21/2021   INFLUENZA, HIGH DOSE SEASONAL PF 10/21/2014, 10/12/2017, 10/11/2020, 10/11/2022, 10/12/2022, 10/17/2023   Influenza Split 10/25/2006   Influenza, Seasonal, Injecte, Preservative Fre 10/10/2015, 09/29/2016   Influenza-Unspecified 10/21/2011, 10/10/2013   Moderna Covid-19 Fall Seasonal Vaccine 68yrs & older 10/11/2021, 03/26/2022, 09/21/2022, 04/09/2023, 10/05/2023   Moderna Covid-19 Vaccine Bivalent Booster 63yrs & up 09/27/2020, 05/26/2021    Moderna SARS-COV2 Booster Vaccination 09/27/2020   Moderna Sars-Covid-2 Vaccination 01/24/2019, 02/21/2019, 12/23/2019, 05/16/2020, 10/11/2021, 03/26/2022, 09/28/2022   PFIZER(Purple Top)SARS-COV-2 Vaccination 01/21/2019, 02/21/2019, 12/23/2019, 05/16/2020   Pneumococcal Conjugate-13 10/10/2013, 10/21/2013   Pneumococcal Polysaccharide-23 10/25/2006   Pneumococcal-Unspecified 01/11/2007   Td 08/12/2005   Zoster Recombinant(Shingrix ) 08/17/2019, 12/25/2019   Zoster, Live 01/11/2008, 09/30/2008     Objective: Vital Signs: BP 137/72 (Cuff Size: Small)   Pulse 84   Temp 98.1 F (36.7 C)   Resp 12   Ht 5' 6.25 (1.683 m)   Wt 104 lb (47.2 kg)   BMI 16.66 kg/m    Physical Exam Vitals and nursing note reviewed.  HENT:     Head: Normocephalic and atraumatic.     Nose: Nose normal.  Eyes:     Conjunctiva/sclera: Conjunctivae normal.     Pupils: Pupils are equal, round, and reactive to light.  Pulmonary:     Effort: Pulmonary effort is normal. No respiratory distress.  Skin:    General: Skin is warm and dry.  Neurological:     Mental Status: She is alert. Mental status is at baseline.  Psychiatric:        Mood and Affect: Mood normal.        Behavior: Behavior normal.      Musculoskeletal Exam:   CDAI Exam: CDAI Score: -- Patient Global: --; Provider Global: -- Swollen: 0 ; Tender: 0  Joint Exam 02/01/2024   All documented joints were normal     Investigation: No additional findings.  Imaging: No results found.  Recent Labs: Lab Results  Component Value Date   WBC 4.9 12/05/2023   HGB 14.3 12/05/2023   PLT 269.0 12/05/2023   NA 137 12/05/2023   K 4.4 12/05/2023   CL 99 12/05/2023   CO2 29 12/05/2023   GLUCOSE 110 (H) 12/05/2023   BUN 20 12/05/2023   CREATININE 1.15 12/05/2023   BILITOT 0.7 12/05/2023   ALKPHOS 62 12/05/2023   AST 22 12/05/2023   ALT 19 12/05/2023   PROT 7.2 12/05/2023   ALBUMIN 4.6 12/05/2023   CALCIUM  9.5 12/05/2023     Speciality Comments: No specialty comments available.  Procedures:  No procedures performed Allergies: Fluticasone, Mometasone, and Fexofenadine   Assessment / Plan:     Visit Diagnoses:  Age-related osteoporosis with current pathological fracture, initial encounter Patient with hx of Osteoporosis, complicated by acute vertebral compression fracture L2 and chronic vertebral compression fracture of L5. Given issues tolerating Fosamax , and need for more aggressive treatment given compression fracture, decision was made to start Forteo .   Patient  with significant side effects including nausea, arthralgia, and myalgias after taking this medication. Given lack of alternative options, patient it agreeable to try a different PTHrP. Will start Tymlos. Discussed R/B/A of Tymlos with patient and she is agreeable to proceed.   Did discuss with patient that I suspect she may be taking too much Calcium . She is currently taking 2400mg  calcium  daily via tablets, as well as multiple calcium  rich foods daily. Will check Magnesium , PTH, intact and calcium , VITAMIN D  25 Hydroxy (Vit-D Deficiency, Fractures), Phosphorus today. However, patient advised to decrease to 2 tablets (1200mg ) of calcium  daily incase that was contributing to her symptoms.  High risk medication use - Will start Tymlos for osteoporosis.  Orders: Orders Placed This Encounter  Procedures   Magnesium    PTH, intact and calcium    VITAMIN D  25 Hydroxy (Vit-D Deficiency, Fractures)   Phosphorus   No orders of the defined types were placed in this encounter.   I personally spent a total of 40 minutes in the care of the patient today including preparing to see the patient, getting/reviewing separately obtained history, performing a medically appropriate exam/evaluation, counseling and educating, placing orders, documenting clinical information in the EHR, and independently interpreting results.   Follow-Up Instructions: Return in about  3 months (around 05/01/2024).   Asberry Claw, DO  Note - This record has been created using Animal nutritionist.  Chart creation errors have been sought, but may not always  have been located. Such creation errors do not reflect on  the standard of medical care.     [1]  Social History Tobacco Use   Smoking status: Never    Passive exposure: Past   Smokeless tobacco: Never  Vaping Use   Vaping status: Never Used  Substance Use Topics   Alcohol use: Not Currently   Drug use: No   "

## 2024-01-28 ENCOUNTER — Encounter: Payer: Self-pay | Admitting: Gastroenterology

## 2024-02-01 ENCOUNTER — Telehealth: Payer: Self-pay

## 2024-02-01 ENCOUNTER — Ambulatory Visit

## 2024-02-01 VITALS — BP 137/72 | HR 84 | Temp 98.1°F | Resp 12 | Ht 66.25 in | Wt 104.0 lb

## 2024-02-01 DIAGNOSIS — R252 Cramp and spasm: Secondary | ICD-10-CM | POA: Diagnosis not present

## 2024-02-01 DIAGNOSIS — M898X9 Other specified disorders of bone, unspecified site: Secondary | ICD-10-CM | POA: Diagnosis not present

## 2024-02-01 DIAGNOSIS — M8000XA Age-related osteoporosis with current pathological fracture, unspecified site, initial encounter for fracture: Secondary | ICD-10-CM | POA: Diagnosis not present

## 2024-02-01 DIAGNOSIS — R748 Abnormal levels of other serum enzymes: Secondary | ICD-10-CM | POA: Diagnosis not present

## 2024-02-01 DIAGNOSIS — Z79899 Other long term (current) drug therapy: Secondary | ICD-10-CM | POA: Diagnosis not present

## 2024-02-01 NOTE — Patient Instructions (Signed)
Abaloparatide Injection What is this medication? ABALOPARATIDE (a ball oh PAR a tide) treats osteoporosis. It works by Interior and spatial designer stronger and less likely to break (fracture). This medicine may be used for other purposes; ask your health care provider or pharmacist if you have questions. COMMON BRAND NAME(S): TYMLOS What should I tell my care team before I take this medication? They need to know if you have any of these conditions: Bone disease other than osteoporosis High levels of an enzyme called alkaline phosphatase in the blood High levels of calcium in the blood History of cancer in the bone Kidney stone Paget's disease Parathyroid disease Receiving radiation therapy An unusual or allergic reaction to abaloparatide, other medications, foods, dyes, or preservatives Pregnant or trying to get pregnant Breast-feeding How should I use this medication? This medication is injected under the skin. You will be taught how to prepare and give it. Take it as directed on the prescription label at the same time every day. Keep taking it unless your care team tells you to stop. It is important that you put your used needles and pens in a special sharps container. Do not put them in a trash can. If you do not have a sharps container, call your pharmacist or care team to get one. A special MedGuide will be given to you by the pharmacist with each prescription and refill. Be sure to read this information carefully each time. Talk to your care team about the use of this medication in children. Special care may be needed. Overdosage: If you think you have taken too much of this medicine contact a poison control center or emergency room at once. NOTE: This medicine is only for you. Do not share this medicine with others. What if I miss a dose? If you miss a dose, take it as soon as you can. If it is almost time for your next dose, take only that dose. Do not take double or extra doses. What may  interact with this medication? Interactions have not been studied. This list may not describe all possible interactions. Give your health care provider a list of all the medicines, herbs, non-prescription drugs, or dietary supplements you use. Also tell them if you smoke, drink alcohol, or use illegal drugs. Some items may interact with your medicine. What should I watch for while using this medication? Visit your care team for regular checks on your progress. You may need blood work done while you are taking this medication. This medication may affect your coordination, reaction time, or judgment. Do not drive or operate machinery until you know how this medication affects you. Sit up or stand slowly to reduce the risk of dizzy or fainting spells. Drinking alcohol with this medication can increase the risk of these side effects. You should make sure that you get enough calcium and vitamin D while you are taking this medication. Discuss the foods you eat and the vitamins you take with your care team. Talk to your care team about your risk of cancer. You may be more at risk for certain types of cancers if you take this medication. Do not share pens with anyone, even if the needle is changed. Each pen should only be used by one person. Sharing could cause an infection. What side effects may I notice from receiving this medication? Side effects that you should report to your care team as soon as possible: Allergic reactions--skin rash, itching, hives, swelling of the face, lips, tongue, or throat High  calcium level--increased thirst or amount of urine, nausea, vomiting, confusion, unusual weakness or fatigue, bone pain Kidney stones--blood in the urine, pain or trouble passing urine, pain in the lower back or sides Low blood pressure--dizziness, feeling faint or lightheaded, blurry vision Side effects that usually do not require medical attention (report these to your care team if they continue or are  bothersome): Dizziness Fatigue Headache Nausea Pain, redness, or irritation at injection site This list may not describe all possible side effects. Call your doctor for medical advice about side effects. You may report side effects to FDA at 1-800-FDA-1088. Where should I keep my medication? Keep out of the reach of children and pets. Store unopened pens in the refrigerator between 2 and 8 degrees C (36 and 46 degrees F). Do not freeze. Avoid exposure to heat. Get rid of any unopened medication after the expiration date. After the first use, store your pen for up to 30 days at room temperature between 20 and 25 degrees C (68 and 77 degrees F). Do not store with a needle attached to the pen. Use the pen for only 30 days. Get rid of your pen 30 days after first opening it even if the pen still contains medication. To get rid of medications that are no longer needed or have expired: Take the medication to a medication take-back program. Check with your pharmacy or law enforcement to find a location. If you cannot return the medication, ask your pharmacist or care team how to get rid of this medication safely. NOTE: This sheet is a summary. It may not cover all possible information. If you have questions about this medicine, talk to your doctor, pharmacist, or health care provider.  2024 Elsevier/Gold Standard (2022-03-25 00:00:00)

## 2024-02-01 NOTE — Telephone Encounter (Signed)
 Please let patient know that our team will make the adjustment on the medication list to be as ideal/correct as possible. I think she can still move forward with her cataract surgery. Forteo  could be causing issues, but not absolutely clear. I think with her history, we should consider SIBO breath testing once again, if she is interested in that then we should move forward with her being scheduled for that. Agree with moving forward with the functional gastroenterology secondary clinic evaluation as already outlined. Please have her send me the updated information once she has seen that provider. Thanks. GM

## 2024-02-01 NOTE — Telephone Encounter (Signed)
 Patient called office stating Dr.Szer was switching her medication to Tymlos. Patient would like to know what the process will be and how long it will be before she can get the medication. Patient would like the prescription sent to [facility].

## 2024-02-02 ENCOUNTER — Telehealth: Payer: Self-pay | Admitting: Pharmacist

## 2024-02-02 DIAGNOSIS — M8000XA Age-related osteoporosis with current pathological fracture, unspecified site, initial encounter for fracture: Secondary | ICD-10-CM

## 2024-02-02 LAB — PTH, INTACT AND CALCIUM
Calcium: 10.1 mg/dL (ref 8.6–10.4)
PTH: 16 pg/mL (ref 16–77)

## 2024-02-02 LAB — PHOSPHORUS: Phosphorus: 4.4 mg/dL — ABNORMAL HIGH (ref 2.1–4.3)

## 2024-02-02 LAB — VITAMIN D 25 HYDROXY (VIT D DEFICIENCY, FRACTURES): Vit D, 25-Hydroxy: 64 ng/mL (ref 30–100)

## 2024-02-02 LAB — MAGNESIUM: Magnesium: 2.3 mg/dL (ref 1.5–2.5)

## 2024-02-02 NOTE — Telephone Encounter (Signed)
 Please let patient know our protocol for SIBO breath testing. Thanks. GM

## 2024-02-02 NOTE — Telephone Encounter (Addendum)
 Submitted a Prior Authorization request to [employer] for TYMLOS via CoverMyMeds. Will update once we receive a response.  Key: [id number]    ----- Message from Asberry Claw, DO sent at 02/01/2024  2:06 PM EST ----- Patient with side effects from Forteo , please apply for Tymlos. Thx

## 2024-02-05 ENCOUNTER — Encounter: Payer: Self-pay | Admitting: Gastroenterology

## 2024-02-05 NOTE — Telephone Encounter (Signed)
 See 02/04/25 patient mychart message. She would like to hold on SIBO testing until seen by her other physicians.

## 2024-02-08 ENCOUNTER — Other Ambulatory Visit (HOSPITAL_COMMUNITY): Payer: Self-pay

## 2024-02-09 ENCOUNTER — Other Ambulatory Visit: Payer: Self-pay

## 2024-02-09 MED ORDER — TYMLOS 3120 MCG/1.56ML ~~LOC~~ SOPN
80.0000 ug | PEN_INJECTOR | Freq: Every day | SUBCUTANEOUS | 5 refills | Status: AC
  Filled 2024-02-13: qty 1.56, 30d supply, fill #0

## 2024-02-09 NOTE — Telephone Encounter (Signed)
 Received notification from [employer] regarding a prior authorization for TYMLOS . Authorization has been APPROVED from 01/03/2024 to 02/01/2026. Approval letter sent to scan center.  Copay for Tymlos  should be $85  Patient can fill through [facility]: [phone]   Authorization # [id number]  Sherry Pennant, PharmD, MPH, BCPS, CPP Clinical Pharmacist

## 2024-02-13 ENCOUNTER — Other Ambulatory Visit: Payer: Self-pay

## 2024-02-13 ENCOUNTER — Other Ambulatory Visit (HOSPITAL_COMMUNITY): Payer: Self-pay

## 2024-02-14 ENCOUNTER — Other Ambulatory Visit: Payer: Self-pay

## 2024-02-16 NOTE — Progress Notes (Signed)
 Counseled patient on purpose, proper use, storage, and adverse effects of Tymlos . Discussed the Tymlos  is PTH peptide agonist which results in stimulation of osteoblast and bone mass. Reviewed that Tymlos  treatment course is for 2 years. Discussed that pen is stable for 30 days after first use and at room temperature. Counseled patient that Tymlos  is a medication that must be injected once daily and that prescription for pen needles will be sent with Tymlos  prescription.  Advised patient to continue taking calcium  1200 mg daily and vitamin D  supplement.  Reviewed the most common adverse effects of Tymlos  including orthostatic hypotension, hypercalciuria, antibody development (which has no clinical significance), GI upset, injection site reaction, and joint pain/arthralgia. Discussed injection site reaction management and injection site locations. Discussed alternating injection site.  Discussed management of dizziness (which can occur for up to 4 hours after injection) including adequate hydration, slowly rising from bed/chairs to prevent falls, and taking Tymlos  at night if needed. Discussed appropriate disposal of sharps.   She plans to start treatment in about 3 weeks - she has cataract surgery coming up and would like to hold on starting until after surgery in case she experiences any side effects  Sherry Pennant, PharmD, MPH, BCPS, CPP Clinical Pharmacist

## 2024-03-05 ENCOUNTER — Ambulatory Visit: Admit: 2024-03-05 | Admitting: Ophthalmology

## 2024-03-05 SURGERY — PHACOEMULSIFICATION, CATARACT, WITH IOL INSERTION
Anesthesia: Topical | Laterality: Left

## 2024-03-12 ENCOUNTER — Ambulatory Visit: Admitting: Gastroenterology

## 2024-03-19 ENCOUNTER — Ambulatory Visit: Admit: 2024-03-19 | Admitting: Ophthalmology

## 2024-03-19 SURGERY — PHACOEMULSIFICATION, CATARACT, WITH IOL INSERTION
Anesthesia: Topical | Laterality: Right

## 2024-04-11 ENCOUNTER — Ambulatory Visit

## 2024-10-25 ENCOUNTER — Other Ambulatory Visit

## 2024-10-29 ENCOUNTER — Ambulatory Visit

## 2024-11-01 ENCOUNTER — Encounter: Admitting: Family Medicine
